# Patient Record
Sex: Female | Born: 1975 | Race: Black or African American | Hispanic: No | Marital: Married | Smoking: Former smoker
Health system: Southern US, Community
[De-identification: ages and names within clinical notes are randomized; demographics above are authoritative.]

## PROBLEM LIST (undated history)

## (undated) DIAGNOSIS — Z9289 Personal history of other medical treatment: Secondary | ICD-10-CM

## (undated) DIAGNOSIS — M2550 Pain in unspecified joint: Secondary | ICD-10-CM

## (undated) DIAGNOSIS — B977 Papillomavirus as the cause of diseases classified elsewhere: Secondary | ICD-10-CM

## (undated) DIAGNOSIS — D649 Anemia, unspecified: Secondary | ICD-10-CM

## (undated) DIAGNOSIS — I10 Essential (primary) hypertension: Secondary | ICD-10-CM

## (undated) DIAGNOSIS — E282 Polycystic ovarian syndrome: Secondary | ICD-10-CM

## (undated) DIAGNOSIS — K219 Gastro-esophageal reflux disease without esophagitis: Secondary | ICD-10-CM

## (undated) DIAGNOSIS — M549 Dorsalgia, unspecified: Secondary | ICD-10-CM

## (undated) DIAGNOSIS — K5900 Constipation, unspecified: Secondary | ICD-10-CM

## (undated) DIAGNOSIS — R002 Palpitations: Secondary | ICD-10-CM

## (undated) DIAGNOSIS — E7800 Pure hypercholesterolemia, unspecified: Secondary | ICD-10-CM

## (undated) DIAGNOSIS — F419 Anxiety disorder, unspecified: Secondary | ICD-10-CM

## (undated) DIAGNOSIS — R7611 Nonspecific reaction to tuberculin skin test without active tuberculosis: Secondary | ICD-10-CM

## (undated) DIAGNOSIS — I509 Heart failure, unspecified: Secondary | ICD-10-CM

## (undated) DIAGNOSIS — I209 Angina pectoris, unspecified: Secondary | ICD-10-CM

## (undated) DIAGNOSIS — R12 Heartburn: Secondary | ICD-10-CM

## (undated) DIAGNOSIS — Z9889 Other specified postprocedural states: Secondary | ICD-10-CM

## (undated) DIAGNOSIS — E785 Hyperlipidemia, unspecified: Secondary | ICD-10-CM

## (undated) DIAGNOSIS — G709 Myoneural disorder, unspecified: Secondary | ICD-10-CM

## (undated) DIAGNOSIS — F32A Depression, unspecified: Secondary | ICD-10-CM

## (undated) DIAGNOSIS — F329 Major depressive disorder, single episode, unspecified: Secondary | ICD-10-CM

## (undated) DIAGNOSIS — K602 Anal fissure, unspecified: Secondary | ICD-10-CM

## (undated) HISTORY — DX: Palpitations: R00.2

## (undated) HISTORY — DX: Personal history of other medical treatment: Z92.89

## (undated) HISTORY — DX: Polycystic ovarian syndrome: E28.2

## (undated) HISTORY — DX: Dorsalgia, unspecified: M54.9

## (undated) HISTORY — DX: Heart failure, unspecified: I50.9

## (undated) HISTORY — DX: Constipation, unspecified: K59.00

## (undated) HISTORY — DX: Pain in unspecified joint: M25.50

## (undated) HISTORY — DX: Anemia, unspecified: D64.9

## (undated) HISTORY — DX: Hyperlipidemia, unspecified: E78.5

## (undated) HISTORY — DX: Heartburn: R12

## (undated) HISTORY — DX: Anal fissure, unspecified: K60.2

## (undated) HISTORY — DX: Essential (primary) hypertension: I10

## (undated) HISTORY — DX: Papillomavirus as the cause of diseases classified elsewhere: B97.7

## (undated) HISTORY — DX: Pure hypercholesterolemia, unspecified: E78.00

## (undated) HISTORY — DX: Myoneural disorder, unspecified: G70.9

---

## 2006-06-11 DIAGNOSIS — R7611 Nonspecific reaction to tuberculin skin test without active tuberculosis: Secondary | ICD-10-CM

## 2006-06-11 HISTORY — DX: Nonspecific reaction to tuberculin skin test without active tuberculosis: R76.11

## 2012-06-30 ENCOUNTER — Observation Stay (HOSPITAL_COMMUNITY)
Admission: EM | Admit: 2012-06-30 | Discharge: 2012-07-03 | Disposition: A | Discharge status: Home / Self Care | Payer: Self-pay | Attending: Family Medicine | Admitting: Family Medicine

## 2012-06-30 ENCOUNTER — Encounter (HOSPITAL_COMMUNITY): Payer: Self-pay | Admitting: *Deleted

## 2012-06-30 ENCOUNTER — Emergency Department (HOSPITAL_COMMUNITY): Payer: Self-pay

## 2012-06-30 DIAGNOSIS — E669 Obesity, unspecified: Secondary | ICD-10-CM

## 2012-06-30 DIAGNOSIS — M549 Dorsalgia, unspecified: Secondary | ICD-10-CM

## 2012-06-30 DIAGNOSIS — I509 Heart failure, unspecified: Secondary | ICD-10-CM | POA: Insufficient documentation

## 2012-06-30 DIAGNOSIS — I428 Other cardiomyopathies: Secondary | ICD-10-CM | POA: Insufficient documentation

## 2012-06-30 DIAGNOSIS — I429 Cardiomyopathy, unspecified: Secondary | ICD-10-CM

## 2012-06-30 DIAGNOSIS — M542 Cervicalgia: Secondary | ICD-10-CM | POA: Insufficient documentation

## 2012-06-30 DIAGNOSIS — I5020 Unspecified systolic (congestive) heart failure: Secondary | ICD-10-CM

## 2012-06-30 DIAGNOSIS — I10 Essential (primary) hypertension: Secondary | ICD-10-CM | POA: Diagnosis present

## 2012-06-30 DIAGNOSIS — R059 Cough, unspecified: Secondary | ICD-10-CM | POA: Insufficient documentation

## 2012-06-30 DIAGNOSIS — Z91199 Patient's noncompliance with other medical treatment and regimen due to unspecified reason: Secondary | ICD-10-CM

## 2012-06-30 DIAGNOSIS — Z9119 Patient's noncompliance with other medical treatment and regimen: Secondary | ICD-10-CM

## 2012-06-30 DIAGNOSIS — R0602 Shortness of breath: Secondary | ICD-10-CM

## 2012-06-30 DIAGNOSIS — R05 Cough: Secondary | ICD-10-CM | POA: Insufficient documentation

## 2012-06-30 DIAGNOSIS — I5021 Acute systolic (congestive) heart failure: Principal | ICD-10-CM | POA: Insufficient documentation

## 2012-06-30 DIAGNOSIS — R002 Palpitations: Secondary | ICD-10-CM | POA: Insufficient documentation

## 2012-06-30 DIAGNOSIS — J811 Chronic pulmonary edema: Secondary | ICD-10-CM | POA: Diagnosis present

## 2012-06-30 DIAGNOSIS — F17200 Nicotine dependence, unspecified, uncomplicated: Secondary | ICD-10-CM | POA: Insufficient documentation

## 2012-06-30 HISTORY — DX: Essential (primary) hypertension: I10

## 2012-06-30 LAB — CBC
HCT: 40.2 % (ref 36.0–46.0)
Hemoglobin: 13.6 g/dL (ref 12.0–15.0)
MCHC: 33.8 g/dL (ref 30.0–36.0)
RBC: 4.6 MIL/uL (ref 3.87–5.11)

## 2012-06-30 LAB — POCT I-STAT TROPONIN I: Troponin i, poc: 0.01 ng/mL (ref 0.00–0.08)

## 2012-06-30 LAB — BASIC METABOLIC PANEL
BUN: 8 mg/dL (ref 6–23)
CO2: 23 mEq/L (ref 19–32)
Chloride: 102 mEq/L (ref 96–112)
GFR calc non Af Amer: >90 mL/min (ref >90)
Glucose, Bld: 108 mg/dL — ABNORMAL HIGH (ref 70–99)
Potassium: 3.5 mEq/L (ref 3.5–5.1)
Sodium: 138 mEq/L (ref 135–145)

## 2012-06-30 MED ORDER — METOPROLOL TARTRATE 25 MG PO TABS
50.0000 mg | ORAL_TABLET | Freq: Once | ORAL | Status: AC
  Administered 2012-06-30: 50 mg via ORAL
  Filled 2012-06-30: qty 2

## 2012-06-30 MED ORDER — ALBUTEROL SULFATE (5 MG/ML) 0.5% IN NEBU
2.5000 mg | INHALATION_SOLUTION | Freq: Once | RESPIRATORY_TRACT | Status: AC
  Administered 2012-06-30: 2.5 mg via RESPIRATORY_TRACT
  Filled 2012-06-30: qty 0.5

## 2012-06-30 MED ORDER — SODIUM CHLORIDE 0.9 % IV BOLUS (SEPSIS)
1000.0000 mL | Freq: Once | INTRAVENOUS | Status: AC
  Administered 2012-06-30: 1000 mL via INTRAVENOUS

## 2012-06-30 MED ORDER — LISINOPRIL 10 MG PO TABS
10.0000 mg | ORAL_TABLET | Freq: Once | ORAL | Status: AC
  Administered 2012-06-30: 10 mg via ORAL
  Filled 2012-06-30: qty 1

## 2012-06-30 MED ORDER — IOHEXOL 350 MG/ML SOLN: 100.0000 mL | Freq: Once | INTRAVENOUS | Status: AC | PRN

## 2012-06-30 MED ORDER — IPRATROPIUM BROMIDE 0.02 % IN SOLN
0.5000 mg | Freq: Once | RESPIRATORY_TRACT | Status: AC
  Administered 2012-06-30: 0.5 mg via RESPIRATORY_TRACT
  Filled 2012-06-30: qty 2.5

## 2012-06-30 NOTE — ED Notes (Signed)
Pt c/o SOB and right neck pain since last Tuesday, flew to [place] on Monday and back last Thursday

## 2012-06-30 NOTE — ED Notes (Signed)
Pt remained at 86% on 2L, Nurse notified PA, obtained order for breathing treatment.

## 2012-06-30 NOTE — ED Notes (Signed)
Pt desatted to 88%. Placed on 2L, pt was having labored respirations, after o2, respiration returned to normal.

## 2012-06-30 NOTE — ED Notes (Signed)
Pt states that the pains she is having now has been going on since Tuesday. Pt states that she does not like pain medicine and can usually manage her pain by using over the counter pain relievers.

## 2012-06-30 NOTE — ED Notes (Signed)
Pt is in XR at current time.

## 2012-06-30 NOTE — ED Provider Notes (Signed)
History     CSN: [id number]  Arrival date & time 06/30/12  2030   First MD Initiated Contact with Patient 06/30/12 2152      Chief Complaint  Patient presents with  . Shortness of Breath    (Consider location/radiation/quality/duration/timing/severity/associated sxs/prior treatment) HPI Comments: Tammy Hogan is a 37 year old female patient with a history of hypertension who presents to the emergency department complaining of persistent shortness of breath x 1 week.  Last Monday she took two plane flights in a row that lasted for a total of 3 hours.  On the second flight she had an episode of shortness of breath and palpitations.  She thought the episode was anxiety.  The next morning she woke up with shortness of breath, spasms in her neck, a headache and palpitations.  She took another plane flight on Thursday.  The palpitations have been intermittent and have been occurring while patient has been in the ED.  She has been having a non-productive cough for three days.  She denies fever, chills, lightheadedness, dizziness, syncope, nausea, vomiting, diarrhea, abdominal pain or chest pain. Denies personal or family history of blood clots. She does not take any exogenous estrogen. No history of asthma. She is a current every day smoker.  Patient is a 37 y.o. female presenting with shortness of breath. The history is provided by the patient.  Shortness of Breath  Associated symptoms include cough and shortness of breath. Pertinent negatives include no chest pain, no fever and no wheezing.    Past Medical History  Diagnosis Date  . Hypertension     History reviewed. No pertinent past surgical history.  History reviewed. No pertinent family history.  History  Substance Use Topics  . Smoking status: Current Every Day Smoker -- 0.5 packs/day  . Smokeless tobacco: Not on file  . Alcohol Use: Yes    OB History    Grav Para Term Preterm Abortions TAB SAB Ect Mult Living        Review of Systems  Constitutional: Negative for fever and chills.  HENT: Positive for neck pain. Negative for neck stiffness.   Respiratory: Positive for cough and shortness of breath. Negative for wheezing.   Cardiovascular: Positive for palpitations. Negative for chest pain.  Gastrointestinal: Negative for nausea and vomiting.  Musculoskeletal: Negative for back pain.  Neurological: Negative for dizziness and light-headedness.  Psychiatric/Behavioral: The patient is not nervous/anxious.   All other systems reviewed and are negative.    Allergies  Percocet  Home Medications  No current outpatient prescriptions on file.  BP 220/130  Pulse 114  Temp 98.1 F (36.7 C)  Resp 22  SpO2 100%  LMP 05/30/2012  Physical Exam  Nursing note and vitals reviewed. Constitutional: She is oriented to person, place, and time. She appears well-developed. She appears distressed.       Obese  HENT:  Head: Normocephalic and atraumatic.  Mouth/Throat: Oropharynx is clear and moist.  Eyes: Conjunctivae normal and EOM are normal. Pupils are equal, round, and reactive to light.  Neck: Normal range of motion. Neck supple. No JVD present. No tracheal deviation present.  Cardiovascular: Regular rhythm, normal heart sounds and intact distal pulses.  Tachycardia present.   Pulmonary/Chest: Accessory muscle usage present. Tachypnea noted.       Expiratory crackles posterior lung bases bilateral.  Abdominal: Soft. Bowel sounds are normal. There is no tenderness.  Musculoskeletal: Normal range of motion. She exhibits no edema.  Neurological: She is alert and oriented to  person, place, and time.  Skin: Skin is warm and dry. She is not diaphoretic.  Psychiatric: Her speech is normal and behavior is normal. Her mood appears anxious.    ED Course  Procedures (including critical care time)  Labs Reviewed  CBC - Abnormal; Notable for the following:    WBC 10.9 (*)     All other components within  normal limits  BASIC METABOLIC PANEL - Abnormal; Notable for the following:    Glucose, Bld 108 (*)     All other components within normal limits  PRO B NATRIURETIC PEPTIDE - Abnormal; Notable for the following:    Pro B Natriuretic peptide (BNP) 462.6 (*)     All other components within normal limits  D-DIMER, QUANTITATIVE  POCT I-STAT TROPONIN I  TROPONIN I   Dg Chest 2 View  06/30/2012  *RADIOLOGY REPORT*  Clinical Data: Shortness of breath and weakness.  CHEST - 2 VIEW  Comparison: None.  Findings: Lungs are clear.  Heart size upper normal.  No pneumothorax or pleural effusion.  IMPRESSION: Negative chest.   Original Report Authenticated By: Holley Dexter, M.D.    Ct Angio Chest Pe W/cm &/or Wo Cm  07/01/2012  *RADIOLOGY REPORT*  Clinical Data:  37 year old with shortness of breath.  CT ANGIOGRAPHY CHEST  Technique:  Multidetector CT imaging of the chest using the standard protocol during bolus administration of intravenous contrast. Multiplanar reconstructed images including MIPs were obtained and reviewed to evaluate the vascular anatomy.  Contrast: OMNIPAQUE IOHEXOL 350 MG/ML SOLN  Comparison: Chest radiograph 06/30/2012  Findings: This is a limited examination due to excessive patient breathing throughout the examination.  There is no evidence for a large or central pulmonary embolism.  The upper abdomen is unremarkable.  No significant pericardial or pleural fluid.  There is no significant mediastinal, hilar or axillary lymphadenopathy.  The trachea and mainstem bronchi are patent.   There is diffuse haziness throughout both lungs which is probably accentuated by the motion artifact.  Findings are also suggestive for atelectasis and cannot exclude some pulmonary edema. No acute bony abnormality.  IMPRESSION: The examination is limited due to excessive motion.  There is no evidence for a large or central pulmonary embolism but difficult to evaluate the lobar and more distal vessels.   Diffuse haziness throughout both lungs is suggestive for atelectasis and cannot exclude pulmonary edema.   Original Report Authenticated By: Richarda Overlie, M.D.     Date: 06/30/2012  Rate: 108  Rhythm: sinus tachycardia  QRS Axis: normal  Intervals: normal  ST/T Wave abnormalities: normal  Conduction Disutrbances:none  Narrative Interpretation: sinus tachycardia, biatrial enlargement, possible anterior infarct, age undetermined  Old EKG Reviewed: none available    1. Pulmonary edema   2. Shortness of breath   3. Hypertension       MDM  37 y/o female with shortness of breath. She has tachycardia, tachypnea, and is hypertensive. D-dimer negative. Metoprolol and lisinopril given for hypertension. O2 sat on 3L oxygen 87%. After receiving breathing treatment her O2 sat increased to 95% on 3L, 91% on RA. Expiratory crackles present in lung bases. CXR clear. Bedside US performed by Dr. Silverio Lay to r/o pericardial effusion which was negative. Obtaining CTA chest to r/o PE despite negative d-dimer.  12:55 AM CT negative for PE, however does show mild pulmonary edema. O2 sat 86% when walking around ED. Patient will be admitted. Case discussed with Dr. Rulon Abide who agrees with plan of care.  1:20 AM Patient will  be admitted by Dr. Dierdre Searles.     Trevor Mace, PA-C 07/01/12 0120

## 2012-07-01 ENCOUNTER — Emergency Department (HOSPITAL_COMMUNITY): Payer: Self-pay

## 2012-07-01 ENCOUNTER — Encounter (HOSPITAL_COMMUNITY): Payer: Self-pay | Admitting: Radiology

## 2012-07-01 DIAGNOSIS — R0602 Shortness of breath: Secondary | ICD-10-CM

## 2012-07-01 DIAGNOSIS — E669 Obesity, unspecified: Secondary | ICD-10-CM

## 2012-07-01 DIAGNOSIS — I10 Essential (primary) hypertension: Secondary | ICD-10-CM | POA: Diagnosis present

## 2012-07-01 DIAGNOSIS — J811 Chronic pulmonary edema: Secondary | ICD-10-CM | POA: Diagnosis present

## 2012-07-01 DIAGNOSIS — Z9119 Patient's noncompliance with other medical treatment and regimen: Secondary | ICD-10-CM

## 2012-07-01 DIAGNOSIS — Z91199 Patient's noncompliance with other medical treatment and regimen due to unspecified reason: Secondary | ICD-10-CM

## 2012-07-01 DIAGNOSIS — M549 Dorsalgia, unspecified: Secondary | ICD-10-CM

## 2012-07-01 DIAGNOSIS — F17200 Nicotine dependence, unspecified, uncomplicated: Secondary | ICD-10-CM

## 2012-07-01 LAB — TSH: TSH: 3.182 u[IU]/mL (ref 0.350–4.500)

## 2012-07-01 LAB — PRO B NATRIURETIC PEPTIDE: Pro B Natriuretic peptide (BNP): 462.6 pg/mL — ABNORMAL HIGH (ref 0–125)

## 2012-07-01 LAB — TROPONIN I: Troponin I: <0.3 ng/mL (ref <0.30)

## 2012-07-01 MED ORDER — SODIUM CHLORIDE 0.9 % IJ SOLN: 3.0000 mL | Freq: Two times a day (BID) | INTRAMUSCULAR | Status: DC

## 2012-07-01 MED ORDER — FUROSEMIDE 10 MG/ML IJ SOLN
60.0000 mg | Freq: Once | INTRAMUSCULAR | Status: AC
  Administered 2012-07-01: 60 mg via INTRAVENOUS

## 2012-07-01 MED ORDER — SODIUM CHLORIDE 0.9 % IV SOLN: 250.0000 mL | INTRAVENOUS | Status: DC | PRN

## 2012-07-01 MED ORDER — MORPHINE SULFATE 2 MG/ML IJ SOLN: 2.0000 mg | INTRAMUSCULAR | Status: DC | PRN

## 2012-07-01 MED ORDER — SODIUM CHLORIDE 0.9 % IJ SOLN: 3.0000 mL | INTRAMUSCULAR | Status: DC | PRN

## 2012-07-01 MED ORDER — LABETALOL HCL 5 MG/ML IV SOLN: 10.0000 mg | INTRAVENOUS | Status: DC | PRN

## 2012-07-01 MED ORDER — FUROSEMIDE 20 MG PO TABS
20.0000 mg | ORAL_TABLET | Freq: Once | ORAL | Status: AC
  Administered 2012-07-01: 20 mg via ORAL
  Filled 2012-07-01: qty 1

## 2012-07-01 MED ORDER — KETOROLAC TROMETHAMINE 30 MG/ML IJ SOLN
30.0000 mg | Freq: Once | INTRAMUSCULAR | Status: AC
Start: 2012-07-01 — End: 2012-07-01
  Administered 2012-07-01: 30 mg via INTRAVENOUS
  Filled 2012-07-01: qty 1

## 2012-07-01 MED ORDER — LABETALOL HCL 5 MG/ML IV SOLN
20.0000 mg | Freq: Once | INTRAVENOUS | Status: AC
  Administered 2012-07-01: 20 mg via INTRAVENOUS
  Filled 2012-07-01: qty 4

## 2012-07-01 MED ORDER — LABETALOL HCL 300 MG PO TABS
300.0000 mg | ORAL_TABLET | Freq: Two times a day (BID) | ORAL | Status: DC
  Filled 2012-07-01 (×2): qty 1

## 2012-07-01 MED ORDER — LABETALOL HCL 200 MG PO TABS
200.0000 mg | ORAL_TABLET | Freq: Two times a day (BID) | ORAL | Status: DC
  Administered 2012-07-01 – 2012-07-02 (×3): 200 mg via ORAL
  Filled 2012-07-01 (×6): qty 1

## 2012-07-01 MED ORDER — IOHEXOL 350 MG/ML SOLN
100.0000 mL | Freq: Once | INTRAVENOUS | Status: AC | PRN
  Administered 2012-07-01: 100 mL via INTRAVENOUS

## 2012-07-01 MED ORDER — METOPROLOL TARTRATE 1 MG/ML IV SOLN
5.0000 mg | Freq: Once | INTRAVENOUS | Status: AC
  Administered 2012-07-01: 5 mg via INTRAVENOUS
  Filled 2012-07-01: qty 5

## 2012-07-01 MED ORDER — ACETAMINOPHEN 500 MG PO TABS
500.0000 mg | ORAL_TABLET | ORAL | Status: DC | PRN
  Administered 2012-07-01 – 2012-07-03 (×5): 500 mg via ORAL
  Filled 2012-07-01 (×5): qty 1

## 2012-07-01 MED ORDER — FUROSEMIDE 10 MG/ML IJ SOLN
INTRAMUSCULAR | Status: AC
  Administered 2012-07-01: 60 mg via INTRAVENOUS
  Filled 2012-07-01: qty 8

## 2012-07-01 MED ORDER — ATENOLOL 50 MG PO TABS
50.0000 mg | ORAL_TABLET | Freq: Every day | ORAL | Status: DC
  Filled 2012-07-01: qty 1

## 2012-07-01 MED ORDER — DOCUSATE SODIUM 100 MG PO CAPS
100.0000 mg | ORAL_CAPSULE | Freq: Two times a day (BID) | ORAL | Status: DC
  Administered 2012-07-01 – 2012-07-02 (×2): 100 mg via ORAL
  Filled 2012-07-01 (×6): qty 1

## 2012-07-01 MED ORDER — LISINOPRIL 10 MG PO TABS
10.0000 mg | ORAL_TABLET | Freq: Every day | ORAL | Status: DC
  Filled 2012-07-01: qty 1

## 2012-07-01 MED ORDER — BIOTENE DRY MOUTH MT LIQD
15.0000 mL | Freq: Two times a day (BID) | OROMUCOSAL | Status: DC
  Administered 2012-07-01 – 2012-07-03 (×3): 15 mL via OROMUCOSAL

## 2012-07-01 MED ORDER — ONDANSETRON HCL 4 MG/2ML IJ SOLN: 4.0000 mg | Freq: Four times a day (QID) | INTRAMUSCULAR | Status: DC | PRN

## 2012-07-01 MED ORDER — LISINOPRIL 20 MG PO TABS
20.0000 mg | ORAL_TABLET | Freq: Every day | ORAL | Status: DC
  Administered 2012-07-01 – 2012-07-03 (×3): 20 mg via ORAL
  Filled 2012-07-01 (×3): qty 1

## 2012-07-01 MED ORDER — ONDANSETRON HCL 4 MG PO TABS: 4.0000 mg | ORAL_TABLET | Freq: Four times a day (QID) | ORAL | Status: DC | PRN

## 2012-07-01 MED ORDER — HYDROCHLOROTHIAZIDE 12.5 MG PO CAPS
12.5000 mg | ORAL_CAPSULE | Freq: Every day | ORAL | Status: DC
  Administered 2012-07-02: 12.5 mg via ORAL
  Filled 2012-07-01: qty 1

## 2012-07-01 NOTE — Progress Notes (Signed)
Pt c/o "feeling like it is hard to breathe." Pt lungs diminished. No crackles heard. Pt sats 92-94% on 3L O2 via nasal cannula. States that it is easier to catch her breath when sitting up in bed. Dr. Gwenlyn Perking paged to make aware. Will continue to monitor. Levonne Spiller, RN

## 2012-07-01 NOTE — H&P (Signed)
[facility] History and Physical  Tammy Hogan NWG:[id number] DOB: [birth year]    PCP:   Default, Provider, MD   Chief Complaint:  Shortness of breath.  HPI: Tammy Hogan is an 37 y.o. female with hx of long standing HTN dated back to when she was 69 y o, hx of noncompliant to medication where she has been off her meds for at least a year, presents to the ER with SOB.  She denied any chest pain.  Evaluation in the ER included an unremarkable labs, with normal renal fx, normal WBC, and a normal CXR.  Because of concern for a PE, a CTPA was done, which excluded a PE, but suggest early pulmonary edema.  In the ER, she was found to be severely HTN, and IV lopressor along with PO Lisinopril was given.  Hospitalist was asked to admit her for HTN pulmonary edema.  Rewiew of Systems:  Constitutional: Negative for malaise, fever and chills. No significant weight loss or weight gain Eyes: Negative for eye pain, redness and discharge, diplopia, visual changes, or flashes of light. ENMT: Negative for ear pain, hoarseness, nasal congestion, sinus pressure and sore throat. No headaches; tinnitus, drooling, or problem swallowing. Cardiovascular: Negative for chest pain, palpitations, diaphoresis, and peripheral edema. ; No orthopnea, PND Respiratory: Negative for cough, hemoptysis, wheezing and stridor. No pleuritic chestpain. Gastrointestinal: Negative for nausea, vomiting, diarrhea, constipation, abdominal pain, melena, blood in stool, hematemesis, jaundice and rectal bleeding.    Genitourinary: Negative for frequency, dysuria, incontinence,flank pain and hematuria; Musculoskeletal: Negative for back pain and neck pain. Negative for swelling and trauma.;  Skin: . Negative for pruritus, rash, abrasions, bruising and skin lesion.; ulcerations Neuro: Negative for  lightheadedness and neck stiffness. Negative for weakness, altered level of consciousness , altered mental status, extremity weakness,  burning feet, involuntary movement, seizure and syncope.  Psych: negative for anxiety, depression, insomnia, tearfulness, panic attacks, hallucinations, paranoia, suicidal or homicidal ideation    Past Medical History  Diagnosis Date  . Hypertension     History reviewed. No pertinent past surgical history.  Medications:  HOME MEDS: Prior to Admission medications   Medication Sig Start Date End Date Taking? Authorizing Provider  Acetaminophen (TYLENOL PO) Take 2 tablets by mouth every 6 (six) hours as needed. For pain   Yes Historical Provider, MD     Allergies:  Allergies  Allergen Reactions  . Percocet (Oxycodone-Acetaminophen) Nausea Only    Social History:   reports that she has been smoking.  She does not have any smokeless tobacco history on file. She reports that she drinks alcohol. She reports that she does not use illicit drugs.  Family History: History reviewed. No pertinent family history.   Physical Exam: Filed Vitals:   07/01/12 0126 07/01/12 0201 07/01/12 0205 07/01/12 0442  BP: 167/119 167/101 150/95 167/133  Pulse:  88 88 92  Temp:    97.8 F (36.6 C)  TempSrc:    Oral  Resp:  19 19 18   Height:    5' 3.75" (1.619 m)  Weight:    117.935 kg (260 lb)  SpO2:  94% 95% 91%   Blood pressure 167/133, pulse 92, temperature 97.8 F (36.6 C), temperature source Oral, resp. rate 18, height 5' 3.75" (1.619 m), weight 117.935 kg (260 lb), last menstrual period 05/30/2012, SpO2 91.00%.  GEN:  Pleasant patient lying in the stretcher in no acute distress; cooperative with exam. PSYCH:  alert and oriented x4; does not appear anxious or depressed; affect is appropriate. HEENT:  Mucous membranes pink and anicteric; PERRLA; EOM intact; no cervical lymphadenopathy nor thyromegaly or carotid bruit; no JVD; There were no stridor. Neck is very supple. Breasts:: Not examined CHEST WALL: No tenderness CHEST: Normal respiration, clear to auscultation bilaterally.  HEART:  Regular rate and rhythm.  There are no murmur, rub, or gallops.   BACK: No kyphosis or scoliosis; no CVA tenderness ABDOMEN: soft and non-tender; no masses, no organomegaly, normal abdominal bowel sounds; no pannus; no intertriginous candida. There is no rebound and no distention. Rectal Exam: Not done EXTREMITIES: No bone or joint deformity; age-appropriate arthropathy of the hands and knees; no edema; no ulcerations.  There is no calf tenderness. Genitalia: not examined PULSES: 2+ and symmetric SKIN: Normal hydration no rash or ulceration CNS: Cranial nerves 2-12 grossly intact no focal lateralizing neurologic deficit.  Speech is fluent; uvula elevated with phonation, facial symmetry and tongue midline. DTR are normal bilaterally, cerebella exam is intact, barbinski is negative and strengths are equaled bilaterally.  No sensory loss.   Labs on Admission:  Basic Metabolic Panel:  Lab 06/30/12 4782  NA 138  K 3.5  CL 102  CO2 23  GLUCOSE 108*  BUN 8  CREATININE 0.68  CALCIUM 9.3  MG --  PHOS --   Liver Function Tests: No results found for this basename: AST:5,ALT:5,ALKPHOS:5,BILITOT:5,PROT:5,ALBUMIN:5 in the last 168 hours No results found for this basename: LIPASE:5,AMYLASE:5 in the last 168 hours No results found for this basename: AMMONIA:5 in the last 168 hours CBC:  Lab 06/30/12 2110  WBC 10.9*  NEUTROABS --  HGB 13.6  HCT 40.2  MCV 87.4  PLT 376   Cardiac Enzymes:  Lab 06/30/12 2338  CKTOTAL --  CKMB --  CKMBINDEX --  TROPONINI <0.30    CBG: No results found for this basename: GLUCAP:5 in the last 168 hours   Radiological Exams on Admission: Dg Chest 2 View  06/30/2012  *RADIOLOGY REPORT*  Clinical Data: Shortness of breath and weakness.  CHEST - 2 VIEW  Comparison: None.  Findings: Lungs are clear.  Heart size upper normal.  No pneumothorax or pleural effusion.  IMPRESSION: Negative chest.   Original Report Authenticated By: Holley Dexter, M.D.    Ct  Angio Chest Pe W/cm &/or Wo Cm  07/01/2012  *RADIOLOGY REPORT*  Clinical Data:  37 year old with shortness of breath.  CT ANGIOGRAPHY CHEST  Technique:  Multidetector CT imaging of the chest using the standard protocol during bolus administration of intravenous contrast. Multiplanar reconstructed images including MIPs were obtained and reviewed to evaluate the vascular anatomy.  Contrast: OMNIPAQUE IOHEXOL 350 MG/ML SOLN  Comparison: Chest radiograph 06/30/2012  Findings: This is a limited examination due to excessive patient breathing throughout the examination.  There is no evidence for a large or central pulmonary embolism.  The upper abdomen is unremarkable.  No significant pericardial or pleural fluid.  There is no significant mediastinal, hilar or axillary lymphadenopathy.  The trachea and mainstem bronchi are patent.   There is diffuse haziness throughout both lungs which is probably accentuated by the motion artifact.  Findings are also suggestive for atelectasis and cannot exclude some pulmonary edema. No acute bony abnormality.  IMPRESSION: The examination is limited due to excessive motion.  There is no evidence for a large or central pulmonary embolism but difficult to evaluate the lobar and more distal vessels.  Diffuse haziness throughout both lungs is suggestive for atelectasis and cannot exclude pulmonary edema.   Original Report Authenticated By: Richarda Overlie,  M.D.     EKG: Independently reviewed. No acute ST-T changes.   Assessment/Plan Present on Admission:  . HTN (hypertension), malignant . Pulmonary edema . Tobacco use disorder   PLAN:  WIll admit her for HTN pulmonary edema.  She needs to be better compliant with her meds.  I will use Atenelol and Lisinopril to tx her severe HTN, and use PRN Labetalol in addition.  She was strongly advise to quit cigarettes as well.   I don't think she has any ischemic cardiomyopathy.  She will likely need outpatient work up for secondary  causes of HTN along with perhaps a polysomnogram.    She is stable, full code, and will be admitted to [facility] service. Other plans as per orders.  Code Status: FULL Unk Lightning, MD. [facility] Pager [phone] 7pm to 7am.  07/01/2012, 6:19 AM

## 2012-07-01 NOTE — Progress Notes (Signed)
Patient seen and examined. Breathing and feeling better; still with mild orthopnea. She was admitted after midnight secondary to accelerated HTN with mild pulmonary vasc congestion. BP improved at this time after IV therapy (labetalol).  Plan: -Will check 2-d echo -Give lasix IV and follow close I's and O's and daily weight -continue labetalol, HCTZ and lisinopril -counseling for smoking and also weight loss provided. -titrate oxygen to RA as tolerated.  Tammy Hogan [phone]

## 2012-07-01 NOTE — ED Notes (Signed)
Pt complaining of back pain, Admitting physician paged, per Admitting, give 30 mg of toradol IV once.

## 2012-07-01 NOTE — Progress Notes (Signed)
Utilization review completed.  

## 2012-07-01 NOTE — Care Management Note (Signed)
    Page 1 of 1   07/02/2012     11:21:54 AM   CARE MANAGEMENT NOTE 07/02/2012  Patient:  JURNEY, OVERACKER   Account Number:  [id number]  Date Initiated:  07/01/2012  Documentation initiated by:  GRAVES-BIGELOW,Boluwatife Mutchler  Subjective/Objective Assessment:   Pt admitted with increased bp and has a new job. pt wil not get paid until the end of the year.     Action/Plan:   CM did discuss the Match Program with the pt. She will have to pay 3.00 for each Rx. Labetalol for 30 day supply at [facility] is 41.72 and she will not be able to afford.   Anticipated DC Date:  07/02/2012   Anticipated DC Plan:  HOME/SELF CARE      DC Planning Services  CM consult  MATCH Program  Medication Assistance      Choice offered to / List presented to:             Status of service:  Completed, signed off Medicare Important Message given?   (If response is "NO", the following Medicare IM given date fields will be blank) Date Medicare IM given:   Date Additional Medicare IM given:    Discharge Disposition:  HOME/SELF CARE  Per UR Regulation:  Reviewed for med. necessity/level of care/duration of stay  If discussed at Long Length of Stay Meetings, dates discussed:    Comments:  07-02-12 Tomi Bamberger, RN,BSN [phone] Match letter provided to pt for each Rx to be 3.00. CM did speak to pt and she has a PCP. Pt unable to give me a name at this time. She states she has the information at home. Pt will set f/u appointment up. No further needs from CM at this time.

## 2012-07-01 NOTE — Progress Notes (Signed)
  Echocardiogram 2D Echocardiogram has been performed.  Tammy Hogan FRANCES 07/01/2012, 5:43 PM

## 2012-07-02 DIAGNOSIS — I428 Other cardiomyopathies: Secondary | ICD-10-CM

## 2012-07-02 DIAGNOSIS — I5020 Unspecified systolic (congestive) heart failure: Secondary | ICD-10-CM

## 2012-07-02 DIAGNOSIS — I429 Cardiomyopathy, unspecified: Secondary | ICD-10-CM

## 2012-07-02 LAB — BASIC METABOLIC PANEL
BUN: 10 mg/dL (ref 6–23)
Calcium: 8.7 mg/dL (ref 8.4–10.5)
Creatinine, Ser: 0.68 mg/dL (ref 0.50–1.10)
GFR calc Af Amer: >90 mL/min (ref >90)
GFR calc non Af Amer: >90 mL/min (ref >90)
Glucose, Bld: 116 mg/dL — ABNORMAL HIGH (ref 70–99)
Potassium: 3.4 mEq/L — ABNORMAL LOW (ref 3.5–5.1)

## 2012-07-02 LAB — CBC
HCT: 37 % (ref 36.0–46.0)
MCH: 28.5 pg (ref 26.0–34.0)
MCHC: 33 g/dL (ref 30.0–36.0)
RDW: 14.5 % (ref 11.5–15.5)

## 2012-07-02 LAB — RAPID URINE DRUG SCREEN, HOSP PERFORMED
Amphetamines: NOT DETECTED
Tetrahydrocannabinol: POSITIVE — AB

## 2012-07-02 MED ORDER — FUROSEMIDE 20 MG PO TABS
20.0000 mg | ORAL_TABLET | Freq: Every day | ORAL | Status: DC
  Administered 2012-07-03: 20 mg via ORAL
  Filled 2012-07-02: qty 1

## 2012-07-02 MED ORDER — POTASSIUM CHLORIDE CRYS ER 20 MEQ PO TBCR
EXTENDED_RELEASE_TABLET | ORAL | Status: AC
  Administered 2012-07-02: 40 meq via ORAL
  Filled 2012-07-02: qty 2

## 2012-07-02 MED ORDER — POTASSIUM CHLORIDE CRYS ER 20 MEQ PO TBCR
40.0000 meq | EXTENDED_RELEASE_TABLET | Freq: Two times a day (BID) | ORAL | Status: AC
  Administered 2012-07-02 (×2): 40 meq via ORAL
  Filled 2012-07-02: qty 2

## 2012-07-02 MED ORDER — METOPROLOL TARTRATE 50 MG PO TABS
50.0000 mg | ORAL_TABLET | Freq: Two times a day (BID) | ORAL | Status: DC
  Administered 2012-07-02 – 2012-07-03 (×2): 50 mg via ORAL
  Filled 2012-07-02 (×3): qty 1

## 2012-07-02 NOTE — Consult Note (Addendum)
Admit date: 06/30/2012 Referring Physician  Dr. Irene Limbo  Primary Cardiologist  Maryori Weide-new Reason for Consultation  cardiomyopathy  HPI: 37 y/o who has had difficult to control HTN for many years.  She was diagnosed with hypertension as a child and treated with salt restriction.  She has been prescribed medications for many years but due to finances, has not always taken her medications. SHe has been unable to get her medicines.  SHe was most recently seen in [place] a few years ago.  She would get free medications there but unfortunately, the free clinic stopped operations.  She has some HCTZ and Diovan at home.  She would only use these medications if she had a headache that she equated to her hypertension.  She says she had a 30 day supply that she was trying to make last for a year.    She came into the hospital with shortness of breath.  She was diureses.  Echocardiogram revealed decreased LV function and there was some evidence of regional wall motion abnormality on certain views.  A concern for coronary artery disease was raised by the echo.  The patient denies any chest pain.  Most recently, walking up stairs has been her most strenuous activity.  She gets some shortness of breath but no pain in her chest.  She is ruled out for MI with blood test.  We discussed further workup for coronary artery disease.  One of her main concerns is that she will will have insurance starting on February 1.  She would like to avoid any large medical expenses until that time.     PMH:   Past Medical History  Diagnosis Date  . Hypertension      PSH:  History reviewed. No pertinent past surgical history.  Allergies:  Percocet Prior to Admit Meds:   Prescriptions prior to admission  Medication Sig Dispense Refill  . Acetaminophen (TYLENOL PO) Take 2 tablets by mouth every 6 (six) hours as needed. For pain       Fam HX:   History reviewed. No pertinent family history. Social HX:    History   Social  History  . Marital Status: Single    Spouse Name: N/A    Number of Children: N/A  . Years of Education: N/A   Occupational History  . Not on file.   Social History Main Topics  . Smoking status: Current Every Day Smoker -- 0.5 packs/day  . Smokeless tobacco: Not on file  . Alcohol Use: Yes  . Drug Use: No  . Sexually Active:    Other Topics Concern  . Not on file   Social History Narrative  . No narrative on file     ROS:  All 11 ROS were addressed and are negative except what is stated in the HPI  Physical Exam: Blood pressure 127/88, pulse 88, temperature 97.6 F (36.4 C), temperature source Oral, resp. rate 18, height 5' 3.75" (1.619 m), weight 116.393 kg (256 lb 9.6 oz), last menstrual period 05/30/2012, SpO2 98.00%.    General: Well developed, well nourished, in no acute distress Head:   Normal cephalic and atramatic  Lungs:   Clear bilaterally to auscultation and percussion. Heart:   HRRR S1 S2 , 2+ right radial, 2+ right dorsalis pedis Abdomen: , abdomen soft and non-tender, obese. Msk:  Back normal, normal gait. Normal strength and tone for age. Extremities:   No  edema.   Neuro: Alert and oriented X 3. Psych:  Normal affect, responds  appropriately    Labs:   Lab Results  Component Value Date   WBC 7.3 07/02/2012   HGB 12.2 07/02/2012   HCT 37.0 07/02/2012   MCV 86.4 07/02/2012   PLT 279 07/02/2012    Lab 07/02/12 0640  NA 136  K 3.4*  CL 102  CO2 23  BUN 10  CREATININE 0.68  CALCIUM 8.7  PROT --  BILITOT --  ALKPHOS --  ALT --  AST --  GLUCOSE 116*   No results found for this basename: PTT   No results found for this basename: INR, PROTIME   Lab Results  Component Value Date   TROPONINI <0.30 07/02/2012     No results found for this basename: CHOL   No results found for this basename: HDL   No results found for this basename: LDLCALC   No results found for this basename: TRIG   No results found for this basename: CHOLHDL   No  results found for this basename: LDLDIRECT      Radiology:  Dg Chest 2 View  06/30/2012  *RADIOLOGY REPORT*  Clinical Data: Shortness of breath and weakness.  CHEST - 2 VIEW  Comparison: None.  Findings: Lungs are clear.  Heart size upper normal.  No pneumothorax or pleural effusion.  IMPRESSION: Negative chest.   Original Report Authenticated By: Holley Dexter, M.D.    Ct Angio Chest Pe W/cm &/or Wo Cm  07/01/2012  *RADIOLOGY REPORT*  Clinical Data:  37 year old with shortness of breath.  CT ANGIOGRAPHY CHEST  Technique:  Multidetector CT imaging of the chest using the standard protocol during bolus administration of intravenous contrast. Multiplanar reconstructed images including MIPs were obtained and reviewed to evaluate the vascular anatomy.  Contrast: OMNIPAQUE IOHEXOL 350 MG/ML SOLN  Comparison: Chest radiograph 06/30/2012  Findings: This is a limited examination due to excessive patient breathing throughout the examination.  There is no evidence for a large or central pulmonary embolism.  The upper abdomen is unremarkable.  No significant pericardial or pleural fluid.  There is no significant mediastinal, hilar or axillary lymphadenopathy.  The trachea and mainstem bronchi are patent.   There is diffuse haziness throughout both lungs which is probably accentuated by the motion artifact.  Findings are also suggestive for atelectasis and cannot exclude some pulmonary edema. No acute bony abnormality.  IMPRESSION: The examination is limited due to excessive motion.  There is no evidence for a large or central pulmonary embolism but difficult to evaluate the lobar and more distal vessels.  Diffuse haziness throughout both lungs is suggestive for atelectasis and cannot exclude pulmonary edema.   Original Report Authenticated By: Richarda Overlie, M.D.     EKG:  NSR, no ST segment changes  Echo: I personally reviewed her echo.  In some views, the septal wall motion or melena he appears more  significant than the remainder of her myocardium.  ASSESSMENT: Newly diagnosed cardiomyopathy, hypertension, tobacco abuse, obesity  PLAN:  She does need an ischemia workup to see if this is a cause of her cardiomyopathy.  We discussed cardiac catheterization.  Given that her medical insurance will start on February 1, she would like to avoid having any procedures done until after that time.  She is ruled out for MI.  She does not have symptoms of angina.  I think it is reasonable to perform diagnostic cath after February 1.  We can try and do this as an outpatient in February.  I stressed the importance of her taking  medications to control her blood pressure.  Ideally, a regimen could be started that would be affordable.  She is currently on metoprolol, lisinopril, furosemide. Blood pressures been better controlled.  I stressed the importance of her stopping smoking.  She would also benefit from weight loss.   Corky Crafts., MD  07/02/2012  4:15 PM

## 2012-07-02 NOTE — Progress Notes (Signed)
[facility] PROGRESS NOTE  Tammy Hogan OZH:[id number] DOB: [birth year] DOA: 06/30/2012 PCP: Default, Provider, MD she has a PCP. Pt unable to give me a name at this time. She states she has the information at home. Pt will set f/u appointment up  Assessment/Plan: 1. Cardiomyopathy, new diagnosis systolic heart failure: Asymptomatic now. Wall motion abnormalities concerning for ischemic etiology. Cardiology consultation requested. Daily weights. Lasix, beta blocker, ACE inhibitor. 2. Acute systolic heart failure: Plan as above. Appears asymptomatic, compensated at this point. 3. Accelerated hypertension, suspected hypertensive urgency or emergency on admission: Overall improved but still labile. Consider outpatient evaluation for secondary hypertension and polysomnogram. EKG sinus rhythm, no acute changes. 4. Uncontrolled Hypertension: Diagnosed approximately age 15. Noncompliant with medications. 5. Smoker: Recommend cessation   Code Status: Full code Family Communication: None present Disposition Plan: Home when workup complete  Brendia Sacks, MD  [facility] Team 5 Pager [phone] If 7PM-7AM, please contact night-coverage at [email or web address], password [facility] 07/02/2012, 1:43 PM  LOS: 2 days   Brief narrative: 37 y.o. female with hx of long standing HTN dated back to when she was 52 y o, hx of noncompliant to medication where she has been off her meds for at least a year, presents to the ER with SOB. She denied any chest pain. Evaluation in the ER included an unremarkable labs, with normal renal fx, normal WBC, and a normal CXR. Because of concern for a PE, a CTPA was done, which excluded a PE, but suggest early pulmonary edema. In the ER, she was found to be severely HTN, and IV lopressor along with PO Lisinopril was given. Hospitalist was asked to admit her for HTN pulmonary edema.  Consultants:  [facility] cardiology  Procedures:  2-D echocardiogram: Left ventricle: The  cavity size was normal. There was moderate concentric hypertrophy. Systolic function was moderately reduced. The estimated ejection fraction was in the range of 35% to 40%. There is severe hypokinesis to akinesis of theanterior and anteroseptal walls to the apex, consistent with LAD territory ischemia / infarct. Doppler parameters are consistent with pseudonormal left ventricular relaxation (grade2 diastolic dysfunction). The E/A ratio is >1.5. The E/e' ratio is >15, suggesting markedly elevated LV filling pressure.   HPI/Subjective: Labile blood pressure. Denies chest pain, shortness of breath. No lower extremity edema. Feels well.  Objective: Filed Vitals:   07/01/12 1128 07/01/12 1413 07/01/12 2034 07/02/12 0531  BP: 158/98 131/85 157/103 139/82  Pulse: 96 89 99 88  Temp:  97.5 F (36.4 C) 98.1 F (36.7 C) 97.8 F (36.6 C)  TempSrc:  Oral Oral Oral  Resp:  20 20 18   Height:      Weight:    116.393 kg (256 lb 9.6 oz)  SpO2:  99% 97% 97%    Intake/Output Summary (Last 24 hours) at 07/02/12 1343 Last data filed at 07/02/12 0500  Gross per 24 hour  Intake     60 ml  Output   2100 ml  Net  -2040 ml   Filed Weights   07/01/12 0442 07/02/12 0531  Weight: 117.935 kg (260 lb) 116.393 kg (256 lb 9.6 oz)    Exam:  General:  Appears calm and comfortable Cardiovascular: RRR, no m/r/g. No LE edema. Telemetry: SR, no arrhythmias  Respiratory: CTA bilaterally, no w/r/r. Normal respiratory effort. Psychiatric: grossly normal mood and affect, speech fluent and appropriate  Data Reviewed: Basic Metabolic Panel:  Lab 07/02/12 1324 06/30/12 2110  NA 136 138  K 3.4* 3.5  CL 102 102  CO2 23 23  GLUCOSE 116* 108*  BUN 10 8  CREATININE 0.68 0.68  CALCIUM 8.7 9.3  MG -- --  PHOS -- --   CBC:  Lab 07/02/12 0640 06/30/12 2110  WBC 7.3 10.9*  NEUTROABS -- --  HGB 12.2 13.6  HCT 37.0 40.2  MCV 86.4 87.4  PLT 279 376   Cardiac Enzymes:  Lab 06/30/12 2338  CKTOTAL --    CKMB --  CKMBINDEX --  TROPONINI <0.30     Basename 06/30/12 2338  PROBNP 462.6*   Studies: Dg Chest 2 View  06/30/2012  *RADIOLOGY REPORT*  Clinical Data: Shortness of breath and weakness.  CHEST - 2 VIEW  Comparison: None.  Findings: Lungs are clear.  Heart size upper normal.  No pneumothorax or pleural effusion.  IMPRESSION: Negative chest.   Original Report Authenticated By: Holley Dexter, M.D.    Ct Angio Chest Pe W/cm &/or Wo Cm  07/01/2012  *RADIOLOGY REPORT*  Clinical Data:  37 year old with shortness of breath.  CT ANGIOGRAPHY CHEST  Technique:  Multidetector CT imaging of the chest using the standard protocol during bolus administration of intravenous contrast. Multiplanar reconstructed images including MIPs were obtained and reviewed to evaluate the vascular anatomy.  Contrast: OMNIPAQUE IOHEXOL 350 MG/ML SOLN  Comparison: Chest radiograph 06/30/2012  Findings: This is a limited examination due to excessive patient breathing throughout the examination.  There is no evidence for a large or central pulmonary embolism.  The upper abdomen is unremarkable.  No significant pericardial or pleural fluid.  There is no significant mediastinal, hilar or axillary lymphadenopathy.  The trachea and mainstem bronchi are patent.   There is diffuse haziness throughout both lungs which is probably accentuated by the motion artifact.  Findings are also suggestive for atelectasis and cannot exclude some pulmonary edema. No acute bony abnormality.  IMPRESSION: The examination is limited due to excessive motion.  There is no evidence for a large or central pulmonary embolism but difficult to evaluate the lobar and more distal vessels.  Diffuse haziness throughout both lungs is suggestive for atelectasis and cannot exclude pulmonary edema.   Original Report Authenticated By: Richarda Overlie, M.D.     Scheduled Meds:   . antiseptic oral rinse  15 mL Mouth Rinse BID  . docusate sodium  100 mg Oral BID  .  hydrochlorothiazide  12.5 mg Oral Daily  . labetalol  200 mg Oral BID  . lisinopril  20 mg Oral Daily  . sodium chloride  3 mL Intravenous Q12H  . sodium chloride  3 mL Intravenous Q12H   Continuous Infusions:   Principal Problem:  *HTN (hypertension), malignant Active Problems:  Pulmonary edema  Back pain, acute  Medically noncompliant  Tobacco use disorder     Brendia Sacks, MD  [facility] Team 5 Pager [phone] If 7PM-7AM, please contact night-coverage at [email or web address], password [facility] 07/02/2012, 1:43 PM  LOS: 2 days   Time spent: 25 minutes

## 2012-07-03 DIAGNOSIS — I10 Essential (primary) hypertension: Secondary | ICD-10-CM

## 2012-07-03 LAB — LIPID PANEL
Cholesterol: 189 mg/dL (ref 0–200)
Triglycerides: 174 mg/dL — ABNORMAL HIGH (ref <150)

## 2012-07-03 MED ORDER — LISINOPRIL 10 MG PO TABS: 10.0000 mg | ORAL_TABLET | Freq: Every day | ORAL | Status: DC

## 2012-07-03 MED ORDER — NITROGLYCERIN 0.4 MG SL SUBL: 0.4000 mg | SUBLINGUAL_TABLET | SUBLINGUAL | Status: DC | PRN

## 2012-07-03 MED ORDER — ASPIRIN EC 81 MG PO TBEC
81.0000 mg | DELAYED_RELEASE_TABLET | Freq: Every day | ORAL | Status: DC
  Administered 2012-07-03: 81 mg via ORAL
  Filled 2012-07-03: qty 1

## 2012-07-03 MED ORDER — ASPIRIN 81 MG PO TBEC: 81.0000 mg | DELAYED_RELEASE_TABLET | Freq: Every day | ORAL | Status: DC

## 2012-07-03 MED ORDER — METOPROLOL TARTRATE 50 MG PO TABS: 50.0000 mg | ORAL_TABLET | Freq: Two times a day (BID) | ORAL | Status: DC

## 2012-07-03 MED ORDER — FUROSEMIDE 20 MG PO TABS: 20.0000 mg | ORAL_TABLET | Freq: Every day | ORAL | Status: DC

## 2012-07-03 NOTE — Progress Notes (Signed)
Pt provided with dc instructions and education. Verbalized understanding. Pt provided with prescriptions and educated on new medications. Pt has card provided by case manager to help with medications until insurance coverage starts in Feb. Pt aware of all follow up appointments. Heart monitor cleaned and returned to front. Pt leaving by foot for home. Levonne Spiller, RN

## 2012-07-03 NOTE — ED Provider Notes (Signed)
Medical screening examination/treatment/procedure(s) were conducted as a shared visit with non-physician practitioner(s) and myself.  I personally evaluated the patient during the encounter  Tammy Hogan is a 37 y.o. female here with SOB. + recent travel as well. Hypoxic on exam and slightly tachypneic. + crackles bilateral bases but no wheezing. BNP slightly elevated at 462. D-dimer negative but suspicion for PE was moderate to high. CTPA showed no PE but some pulmonary edema. Patient admitted to medicine for further workup and for hypoxia.    Richardean Canal, MD 07/03/12 1308

## 2012-07-03 NOTE — Progress Notes (Signed)
Pt c/o SOB while moving her covers in bed. Pt o2 sats 95% on RA. Pt felt better when I arrived to room. Will continue to monitor.

## 2012-07-03 NOTE — Progress Notes (Signed)
[facility] PROGRESS NOTE  Jess Sulak YNW:[id number] DOB: 11-09-1975 DOA: 06/30/2012 PCP: Default, Provider, MD she has a PCP. Pt unable to give me a name at this time. She states she has the information at home. Pt will set f/u appointment up  Assessment/Plan: 1. Cardiomyopathy, new diagnosis systolic heart failure: Asymptomatic. Discussed with Dr. Cydney Ok hypertensive etiology rather than ischemic, however wall motion abnormalities to raise concern for ischemic etiology. Per discussion with him okay for discharge, outpatient followup which he will arrange for outpatient catheterization. Lasix, beta blocker, ACE inhibitor. Add aspirin. 2. Acute systolic heart failure: Plan as above. Appears asymptomatic, compensated at this point. 3. Accelerated hypertension, suspected hypertensive urgency or emergency on admission: Much improved.  4. Uncontrolled Hypertension: Diagnosed approximately age 49. Noncompliant with medications. Much improved as above. 5. Smoker: Recommend cessation.   Code Status: Full code Family Communication: None present Disposition Plan: Home   Brendia Sacks, MD  [facility] Team 5 Pager [phone] If 7PM-7AM, please contact night-coverage at [email or web address], password [facility] 07/03/2012, 12:59 PM  LOS: 3 days   Brief narrative: 37 y.o. female with hx of long standing HTN dated back to when she was 13 y o, hx of noncompliant to medication where she has been off her meds for at least a year, presents to the ER with SOB. She denied any chest pain. Evaluation in the ER included an unremarkable labs, with normal renal fx, normal WBC, and a normal CXR. Because of concern for a PE, a CTPA was done, which excluded a PE, but suggest early pulmonary edema. In the ER, she was found to be severely HTN, and IV lopressor along with PO Lisinopril was given. Hospitalist was asked to admit her for HTN pulmonary edema.  Consultants:  [facility]  cardiology  Procedures:  2-D echocardiogram: Left ventricle: The cavity size was normal. There was moderate concentric hypertrophy. Systolic function was moderately reduced. The estimated ejection fraction was in the range of 35% to 40%. There is severe hypokinesis to akinesis of theanterior and anteroseptal walls to the apex, consistent with LAD territory ischemia / infarct. Doppler parameters are consistent with pseudonormal left ventricular relaxation (grade2 diastolic dysfunction). The E/A ratio is >1.5. The E/e' ratio is >15, suggesting markedly elevated LV filling pressure.   HPI/Subjective: Blood pressure improved. Feels much better. Has some muscular tenderness in back and neck.  Objective: Filed Vitals:   07/02/12 0531 07/02/12 1400 07/02/12 2100 07/02/12 2301  BP: 139/82 127/88 156/101 139/95  Pulse: 88 88 96 86  Temp: 97.8 F (36.6 C) 97.6 F (36.4 C) 98 F (36.7 C)   TempSrc: Oral  Oral   Resp: 18 18 20    Height:      Weight: 116.393 kg (256 lb 9.6 oz)     SpO2: 97% 98% 100%     Intake/Output Summary (Last 24 hours) at 07/03/12 1259 Last data filed at 07/02/12 1816  Gross per 24 hour  Intake      0 ml  Output    900 ml  Net   -900 ml   Filed Weights   07/01/12 0442 07/02/12 0531  Weight: 117.935 kg (260 lb) 116.393 kg (256 lb 9.6 oz)    Exam:  General:  Appears calm and comfortable Cardiovascular: RRR, no m/r/g. No LE edema. Telemetry: SR, no arrhythmias  Respiratory: CTA bilaterally, no w/r/r. Normal respiratory effort. Psychiatric: grossly normal mood and affect, speech fluent and appropriate  Exam current 1/23  Data Reviewed: Basic Metabolic Panel:  Lab 07/02/12  1610 06/30/12 2110  NA 136 138  K 3.4* 3.5  CL 102 102  CO2 23 23  GLUCOSE 116* 108*  BUN 10 8  CREATININE 0.68 0.68  CALCIUM 8.7 9.3  MG -- --  PHOS -- --   CBC:  Lab 07/02/12 0640 06/30/12 2110  WBC 7.3 10.9*  NEUTROABS -- --  HGB 12.2 13.6  HCT 37.0 40.2  MCV 86.4 87.4   PLT 279 376   Cardiac Enzymes:  Lab 07/02/12 1415 06/30/12 2338  CKTOTAL -- --  CKMB -- --  CKMBINDEX -- --  TROPONINI <0.30 <0.30     Basename 06/30/12 2338  PROBNP 462.6*   Studies: No results found.  Scheduled Meds:    . antiseptic oral rinse  15 mL Mouth Rinse BID  . aspirin EC  81 mg Oral Daily  . docusate sodium  100 mg Oral BID  . furosemide  20 mg Oral Daily  . lisinopril  20 mg Oral Daily  . metoprolol tartrate  50 mg Oral BID  . sodium chloride  3 mL Intravenous Q12H  . sodium chloride  3 mL Intravenous Q12H   Continuous Infusions:   Principal Problem:  *HTN (hypertension), malignant Active Problems:  Pulmonary edema  Back pain, acute  Medically noncompliant  Tobacco use disorder  Cardiomyopathy  Systolic heart failure     Brendia Sacks, MD  [facility] Team 5 Pager [phone] If 7PM-7AM, please contact night-coverage at [email or web address], password [facility] 07/03/2012, 12:59 PM  LOS: 3 days

## 2012-07-03 NOTE — Discharge Summary (Signed)
Physician Discharge Summary  Tammy Hogan WUJ:[id number] DOB: December 16, 1975 DOA: 06/30/2012  PCP: Default, Provider, MDshe has a PCP. Pt unable to give me a name at this time. She states she has the information at home. Pt will set f/u appointment up  Admit date: 06/30/2012 Discharge date: 07/03/2012  Recommendations for Outpatient Follow-up:  1. Followup new diagnosis of cardiomyopathy, hypertensive versus ischemic 2. Continue treatment for uncontrolled hypertension 3. Continue to encourage smoking cessation   Follow-up Information    Follow up with PCP. (follow-up in 1 week)       Follow up with Corky Crafts., MD. (office will contact you for appointment)    Contact information:   [address] [place] [address] [phone]         Discharge Diagnoses:  1. Cardiomyopathy, new diagnosis systolic heart failure 2. Acute systolic heart failure 3. Accelerated hypertension, suspected hypertensive urgency or emergency on admission 4. Uncontrolled Hypertension 5. Smoker  Discharge Condition: Improved Disposition: Home  Diet recommendation: Heart healthy  Filed Weights   07/01/12 0442 07/02/12 0531  Weight: 117.935 kg (260 lb) 116.393 kg (256 lb 9.6 oz)    History of present illness:  37 y.o. female with hx of long standing HTN dated back to when she was 64 y o, hx of noncompliant to medication where she has been off her meds for at least a year, presents to the ER with SOB. She denied any chest pain. Evaluation in the ER included an unremarkable labs, with normal renal fx, normal WBC, and a normal CXR. Because of concern for a PE, a CTPA was done, which excluded a PE, but suggest early pulmonary edema. In the ER, she was found to be severely HTN, and IV lopressor along with PO Lisinopril was given. Hospitalist was asked to admit her for HTN pulmonary edema.  Hospital Course:  Ms. Alver Sorrow was admitted to the medical floor and treated for pulmonary edema.  Symptoms improved with diuresis and patient had no chest pain. She did have a history of noncompliance with medications. Hypertension improved with medications and diuresis. 2-D echocardiogram revealed cardiomyopathy, new and cardiology was consulted. Dr. Eldridge Dace recommended cardiac catheterization but thought this could be deferred to the outpatient setting and he will arrange followup. Recommend continue beta blocker, ACE inhibitor and diuretics. Patient does have a primary care physician and I have stressed to her that she should establish with a physician in followup next week.  1. Cardiomyopathy, new diagnosis systolic heart failure: Asymptomatic. Discussed with Dr. Cydney Ok hypertensive etiology rather than ischemic, however wall motion abnormalities raise concern for ischemic etiology. Per discussion with him okay for discharge, outpatient followup which he will arrange for outpatient catheterization. Lasix, beta blocker, ACE inhibitor. Add aspirin.  2. Acute systolic heart failure: Plan as above. Appears asymptomatic, compensated at this point.  3. Accelerated hypertension, suspected hypertensive urgency or emergency on admission: Resolved with treatment.  4. Uncontrolled Hypertension: Diagnosed approximately age 13. Noncompliant with medications. Much improved as above.  5. Smoker: Recommend cessation.  Consultants:  [facility] cardiology  Procedures:  2-D echocardiogram: Left ventricle: The cavity size was normal. There was moderate concentric hypertrophy. Systolic function was moderately reduced. The estimated ejection fraction was in the range of 35% to 40%. There is severe hypokinesis to akinesis of theanterior and anteroseptal walls to the apex, consistent with LAD territory ischemia / infarct. Doppler parameters are consistent with pseudonormal left ventricular relaxation (grade2 diastolic dysfunction). The E/A ratio is >1.5. The E/e' ratio  is >15, suggesting markedly elevated LV  filling pressure.  Discharge Instructions  Discharge Orders    Future Orders Please Complete By Expires   Diet - low sodium heart healthy      Activity as tolerated - No restrictions      Discharge instructions      Comments:   Be sure to followup with your primary care physician in the next week. The cardiology office will contact you for followup. You were started on new medications to control your blood pressure and for your heart. Start weighing herself daily and keep a log. Call your physician or seek immediate medical attention for recurrent chest pain or worsening of condition. Call your physician for abnormal weight gain.       Medication List     As of 07/03/2012  1:10 PM    STOP taking these medications         TYLENOL PO      TAKE these medications         aspirin 81 MG EC tablet   Take 1 tablet (81 mg total) by mouth daily.      furosemide 20 MG tablet   Commonly known as: LASIX   Take 1 tablet (20 mg total) by mouth daily.      lisinopril 10 MG tablet   Commonly known as: PRINIVIL,ZESTRIL   Take 1 tablet (10 mg total) by mouth daily.      metoprolol 50 MG tablet   Commonly known as: LOPRESSOR   Take 1 tablet (50 mg total) by mouth 2 (two) times daily.      nitroGLYCERIN 0.4 MG SL tablet   Commonly known as: NITROSTAT   Place 1 tablet (0.4 mg total) under the tongue every 5 (five) minutes as needed for chest pain.         The results of significant diagnostics from this hospitalization (including imaging, microbiology, ancillary and laboratory) are listed below for reference.    Significant Diagnostic Studies: Dg Chest 2 View  06/30/2012  *RADIOLOGY REPORT*  Clinical Data: Shortness of breath and weakness.  CHEST - 2 VIEW  Comparison: None.  Findings: Lungs are clear.  Heart size upper normal.  No pneumothorax or pleural effusion.  IMPRESSION: Negative chest.   Original Report Authenticated By: Holley Dexter, M.D.    Ct Angio Chest Pe W/cm &/or Wo  Cm  07/01/2012  *RADIOLOGY REPORT*  Clinical Data:  37 year old with shortness of breath.  CT ANGIOGRAPHY CHEST  Technique:  Multidetector CT imaging of the chest using the standard protocol during bolus administration of intravenous contrast. Multiplanar reconstructed images including MIPs were obtained and reviewed to evaluate the vascular anatomy.  Contrast: OMNIPAQUE IOHEXOL 350 MG/ML SOLN  Comparison: Chest radiograph 06/30/2012  Findings: This is a limited examination due to excessive patient breathing throughout the examination.  There is no evidence for a large or central pulmonary embolism.  The upper abdomen is unremarkable.  No significant pericardial or pleural fluid.  There is no significant mediastinal, hilar or axillary lymphadenopathy.  The trachea and mainstem bronchi are patent.   There is diffuse haziness throughout both lungs which is probably accentuated by the motion artifact.  Findings are also suggestive for atelectasis and cannot exclude some pulmonary edema. No acute bony abnormality.  IMPRESSION: The examination is limited due to excessive motion.  There is no evidence for a large or central pulmonary embolism but difficult to evaluate the lobar and more distal vessels.  Diffuse haziness throughout  both lungs is suggestive for atelectasis and cannot exclude pulmonary edema.   Original Report Authenticated By: Richarda Overlie, M.D.     Labs: Basic Metabolic Panel:  Lab 07/02/12 4098 06/30/12 2110  NA 136 138  K 3.4* 3.5  CL 102 102  CO2 23 23  GLUCOSE 116* 108*  BUN 10 8  CREATININE 0.68 0.68  CALCIUM 8.7 9.3  MG -- --  PHOS -- --  CBC:  Lab 07/02/12 0640 06/30/12 2110  WBC 7.3 10.9*  NEUTROABS -- --  HGB 12.2 13.6  HCT 37.0 40.2  MCV 86.4 87.4  PLT 279 376   Cardiac Enzymes:  Lab 07/02/12 1415 06/30/12 2338  CKTOTAL -- --  CKMB -- --  CKMBINDEX -- --  TROPONINI <0.30 <0.30    Basename 06/30/12 2338  PROBNP 462.6*   Principal Problem:  *HTN  (hypertension), malignant Active Problems:  Pulmonary edema  Back pain, acute  Medically noncompliant  Tobacco use disorder  Cardiomyopathy  Systolic heart failure   Time coordinating discharge: 25 minutes  Signed:  Brendia Sacks, MD [facility] 07/03/2012, 1:10 PM

## 2012-07-12 HISTORY — PX: CARDIAC CATHETERIZATION: SHX172

## 2012-07-22 ENCOUNTER — Other Ambulatory Visit: Payer: Self-pay | Admitting: Interventional Cardiology

## 2012-07-23 ENCOUNTER — Encounter (HOSPITAL_BASED_OUTPATIENT_CLINIC_OR_DEPARTMENT_OTHER)
Admission: RE | Disposition: A | Discharge status: Home / Self Care | Payer: Self-pay | Source: Ambulatory Visit | Attending: Interventional Cardiology

## 2012-07-23 ENCOUNTER — Inpatient Hospital Stay (HOSPITAL_BASED_OUTPATIENT_CLINIC_OR_DEPARTMENT_OTHER)
Admission: RE | Admit: 2012-07-23 | Discharge: 2012-07-23 | Disposition: A | Discharge status: Home / Self Care | Payer: BC Managed Care – PPO | Source: Ambulatory Visit | Attending: Interventional Cardiology | Admitting: Interventional Cardiology

## 2012-07-23 DIAGNOSIS — I5020 Unspecified systolic (congestive) heart failure: Secondary | ICD-10-CM

## 2012-07-23 DIAGNOSIS — I429 Cardiomyopathy, unspecified: Secondary | ICD-10-CM

## 2012-07-23 DIAGNOSIS — I10 Essential (primary) hypertension: Secondary | ICD-10-CM

## 2012-07-23 DIAGNOSIS — I428 Other cardiomyopathies: Secondary | ICD-10-CM | POA: Insufficient documentation

## 2012-07-23 DIAGNOSIS — J811 Chronic pulmonary edema: Secondary | ICD-10-CM

## 2012-07-23 DIAGNOSIS — Z9889 Other specified postprocedural states: Secondary | ICD-10-CM

## 2012-07-23 HISTORY — DX: Other specified postprocedural states: Z98.890

## 2012-07-23 SURGERY — JV LEFT HEART CATHETERIZATION WITH CORONARY ANGIOGRAM
Anesthesia: Moderate Sedation

## 2012-07-23 MED ORDER — SODIUM CHLORIDE 0.9 % IV SOLN
INTRAVENOUS | Status: DC
  Administered 2012-07-23: 12:00:00 via INTRAVENOUS

## 2012-07-23 MED ORDER — SODIUM CHLORIDE 0.9 % IJ SOLN: 3.0000 mL | INTRAMUSCULAR | Status: DC | PRN

## 2012-07-23 MED ORDER — LISINOPRIL 20 MG PO TABS: 20.0000 mg | ORAL_TABLET | Freq: Every day | ORAL | Status: DC

## 2012-07-23 MED ORDER — DIAZEPAM 5 MG PO TABS
5.0000 mg | ORAL_TABLET | ORAL | Status: AC
  Administered 2012-07-23: 5 mg via ORAL

## 2012-07-23 MED ORDER — SODIUM CHLORIDE 0.9 % IV SOLN: 250.0000 mL | INTRAVENOUS | Status: DC | PRN

## 2012-07-23 MED ORDER — SODIUM CHLORIDE 0.9 % IJ SOLN: 3.0000 mL | Freq: Two times a day (BID) | INTRAMUSCULAR | Status: DC

## 2012-07-23 MED ORDER — ASPIRIN 81 MG PO CHEW
324.0000 mg | CHEWABLE_TABLET | ORAL | Status: AC
  Administered 2012-07-23: 324 mg via ORAL

## 2012-07-23 MED ORDER — SODIUM CHLORIDE 0.9 % IV SOLN: 1.0000 mL/kg/h | INTRAVENOUS | Status: AC

## 2012-07-23 MED ORDER — ONDANSETRON HCL 4 MG/2ML IJ SOLN: 4.0000 mg | Freq: Four times a day (QID) | INTRAMUSCULAR | Status: DC | PRN

## 2012-07-23 NOTE — OR Nursing (Signed)
Discharge instructions reviewed and signed, pt stated understanding, ambulated in hall without difficulty, site level 0, transported to friend's car via wheelchair 

## 2012-07-23 NOTE — OR Nursing (Signed)
Tegaderm dressing applied, site level 0, bedrest begins at 1305

## 2012-07-23 NOTE — CV Procedure (Signed)
PROCEDURE:  Left heart catheterization with selective coronary angiography, left ventriculogram. Abdominal aortogram.  INDICATIONS:  Cardiomyopathy  The risks, benefits, and details of the procedure were explained to the patient.  The patient verbalized understanding and wanted to proceed.  Informed written consent was obtained.  PROCEDURE TECHNIQUE:  After Xylocaine anesthesia a 17F sheath was placed in the right femoral artery with a single anterior needle wall stick.   Left coronary angiography was done using a Judkins L4 guide catheter.  Right coronary angiography was done using a Judkins R4 guide catheter.  Left ventriculography was done using a pigtail catheter.    CONTRAST:  Total of 90 cc.  COMPLICATIONS:  None.    HEMODYNAMICS:  Aortic pressure was 165/78; LV pressure was 165/16; LVEDP 24.  There was no gradient between the left ventricle and aorta.    ANGIOGRAPHIC DATA:   The left main coronary artery is widely patent.  The left anterior descending artery is a large vessel which wraps around the apex.  There are three small diagonals.  The entire LAD systems appears angiographically normal.  The left circumflex artery is a large codominant vessel.  The OM1 is medium sized and is patent.The left PDA is patent.  The right coronary artery is a medium sized codominant artery.  THe posterolateral artery is small and patent.  The PDA is large and widely patent.  LEFT VENTRICULOGRAM:  Left ventricular angiogram was done in the 30 RAO projection and revealed global left ventricular dysfunction, with severe inferobasal hypokinesis.  The estimated ejection fraction is 30-35%.  LVEDP was 24 mmHg.  ABDOMINAL AORTOGRAM:  No AAA. Bilateral single renal arteries which are widely patent.  Aortoiliac bifurcation is widely patent.   IMPRESSIONS:  1. Normal left main coronary artery. 2. Normal left anterior descending artery and its branches. 3. Normal left circumflex artery and its  branches. 4. Normal right coronary artery. 5. Moderate decreased left ventricular systolic function.  LVEDP 24 mmHg.  Ejection fraction 30-35%.  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  I stressed the importance of taking her BP meds to help improve her LV function.  Will increase lisinopril to 20 mg daily.  She states that she has no intentions of getting pregnant, and has polycystic ovarian disease making pregnancy unlikely.  Will check urine pregnancy test since it was not checked at her last hospitalization.  Continue beta blocker.  Followup in the office with me in 2-3 weeks.

## 2012-07-23 NOTE — OR Nursing (Signed)
Dr Varanasi at bedside to discuss results and treatment plan with pt and family 

## 2012-07-23 NOTE — Progress Notes (Signed)
Positive Allen's test to right hand, spo2 95%.

## 2013-07-02 ENCOUNTER — Emergency Department (HOSPITAL_COMMUNITY)
Admission: EM | Admit: 2013-07-02 | Discharge: 2013-07-03 | Disposition: A | Discharge status: Home / Self Care | Payer: BC Managed Care – PPO | Attending: Emergency Medicine | Admitting: Emergency Medicine

## 2013-07-02 ENCOUNTER — Emergency Department (HOSPITAL_COMMUNITY): Payer: BC Managed Care – PPO

## 2013-07-02 ENCOUNTER — Encounter (HOSPITAL_COMMUNITY): Payer: Self-pay | Admitting: Emergency Medicine

## 2013-07-02 DIAGNOSIS — Y9301 Activity, walking, marching and hiking: Secondary | ICD-10-CM | POA: Insufficient documentation

## 2013-07-02 DIAGNOSIS — I10 Essential (primary) hypertension: Secondary | ICD-10-CM | POA: Insufficient documentation

## 2013-07-02 DIAGNOSIS — Z79899 Other long term (current) drug therapy: Secondary | ICD-10-CM | POA: Insufficient documentation

## 2013-07-02 DIAGNOSIS — Z3202 Encounter for pregnancy test, result negative: Secondary | ICD-10-CM | POA: Insufficient documentation

## 2013-07-02 DIAGNOSIS — Y92009 Unspecified place in unspecified non-institutional (private) residence as the place of occurrence of the external cause: Secondary | ICD-10-CM | POA: Insufficient documentation

## 2013-07-02 DIAGNOSIS — W010XXA Fall on same level from slipping, tripping and stumbling without subsequent striking against object, initial encounter: Secondary | ICD-10-CM | POA: Insufficient documentation

## 2013-07-02 DIAGNOSIS — S82892A Other fracture of left lower leg, initial encounter for closed fracture: Secondary | ICD-10-CM

## 2013-07-02 DIAGNOSIS — S82853A Displaced trimalleolar fracture of unspecified lower leg, initial encounter for closed fracture: Secondary | ICD-10-CM | POA: Insufficient documentation

## 2013-07-02 DIAGNOSIS — Z7982 Long term (current) use of aspirin: Secondary | ICD-10-CM | POA: Insufficient documentation

## 2013-07-02 DIAGNOSIS — F17200 Nicotine dependence, unspecified, uncomplicated: Secondary | ICD-10-CM | POA: Insufficient documentation

## 2013-07-02 LAB — POCT PREGNANCY, URINE: Preg Test, Ur: NEGATIVE

## 2013-07-02 IMAGING — CR DG ANKLE COMPLETE 3+V*L*
3 series · 3 of 3 positions shown · non-contrast
Comparison: None.

CLINICAL DATA: Status post fall with lower left leg deformity

EXAM:
LEFT ANKLE COMPLETE - 3+ VIEW

[view not recorded (1 of 3)]
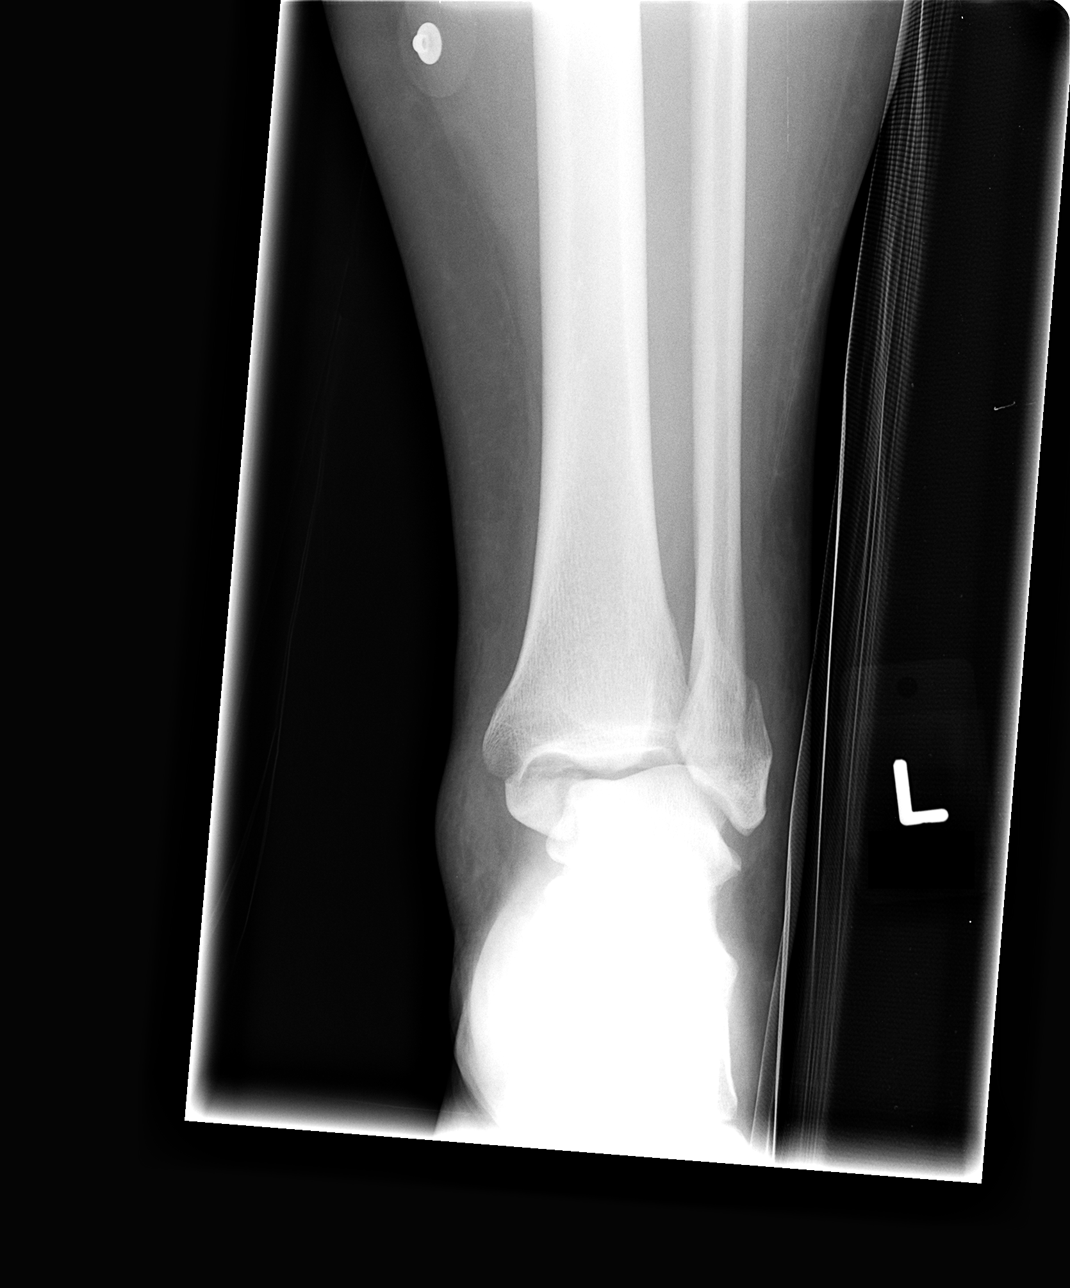

[view not recorded (2 of 3)]
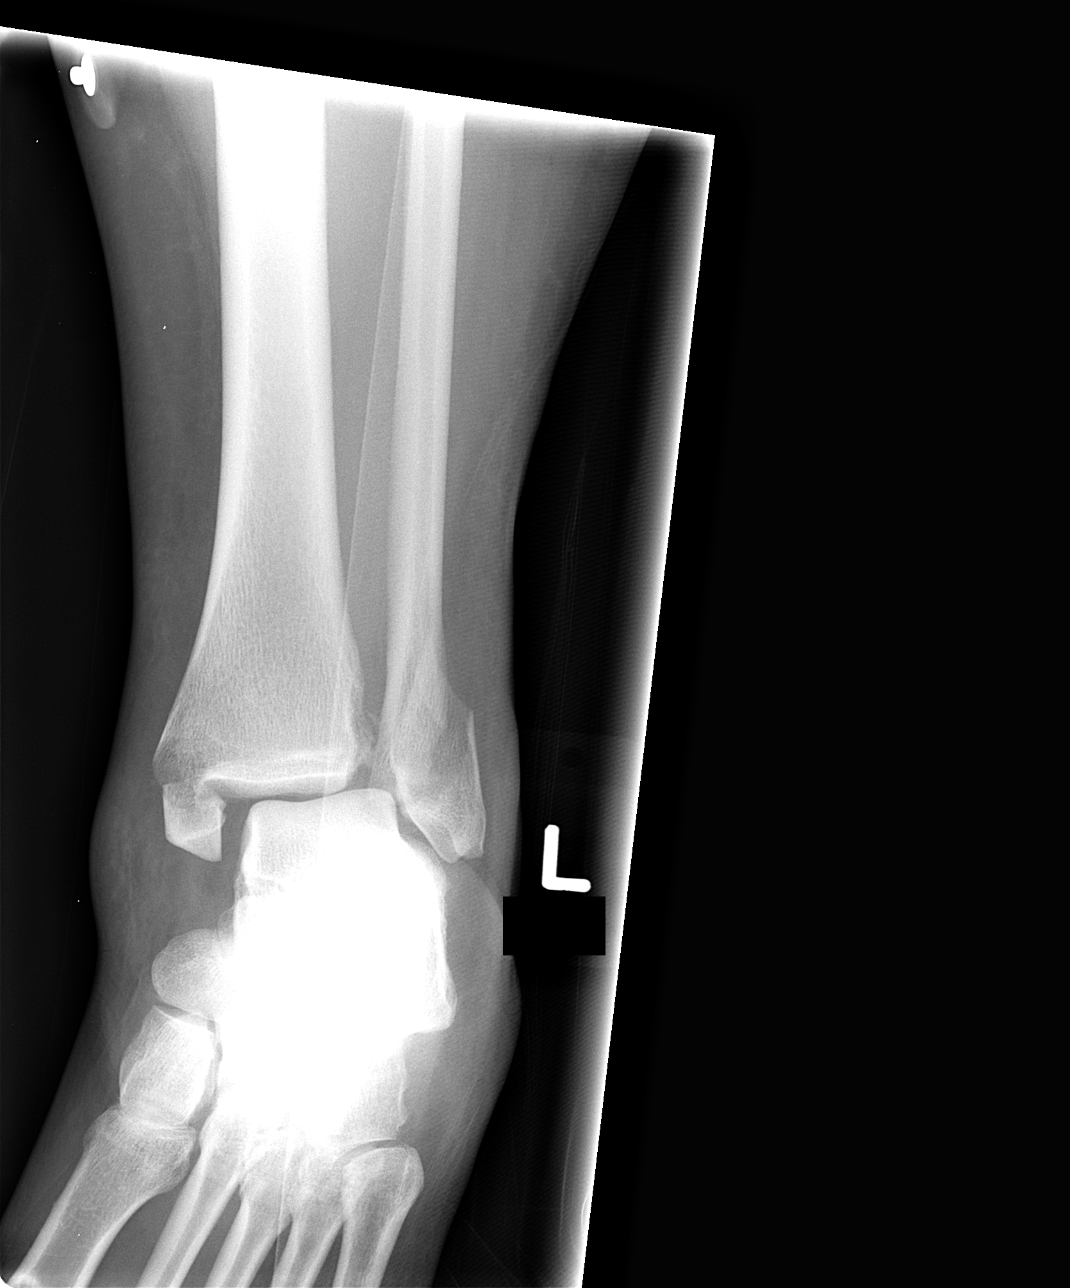

[view not recorded (3 of 3)]
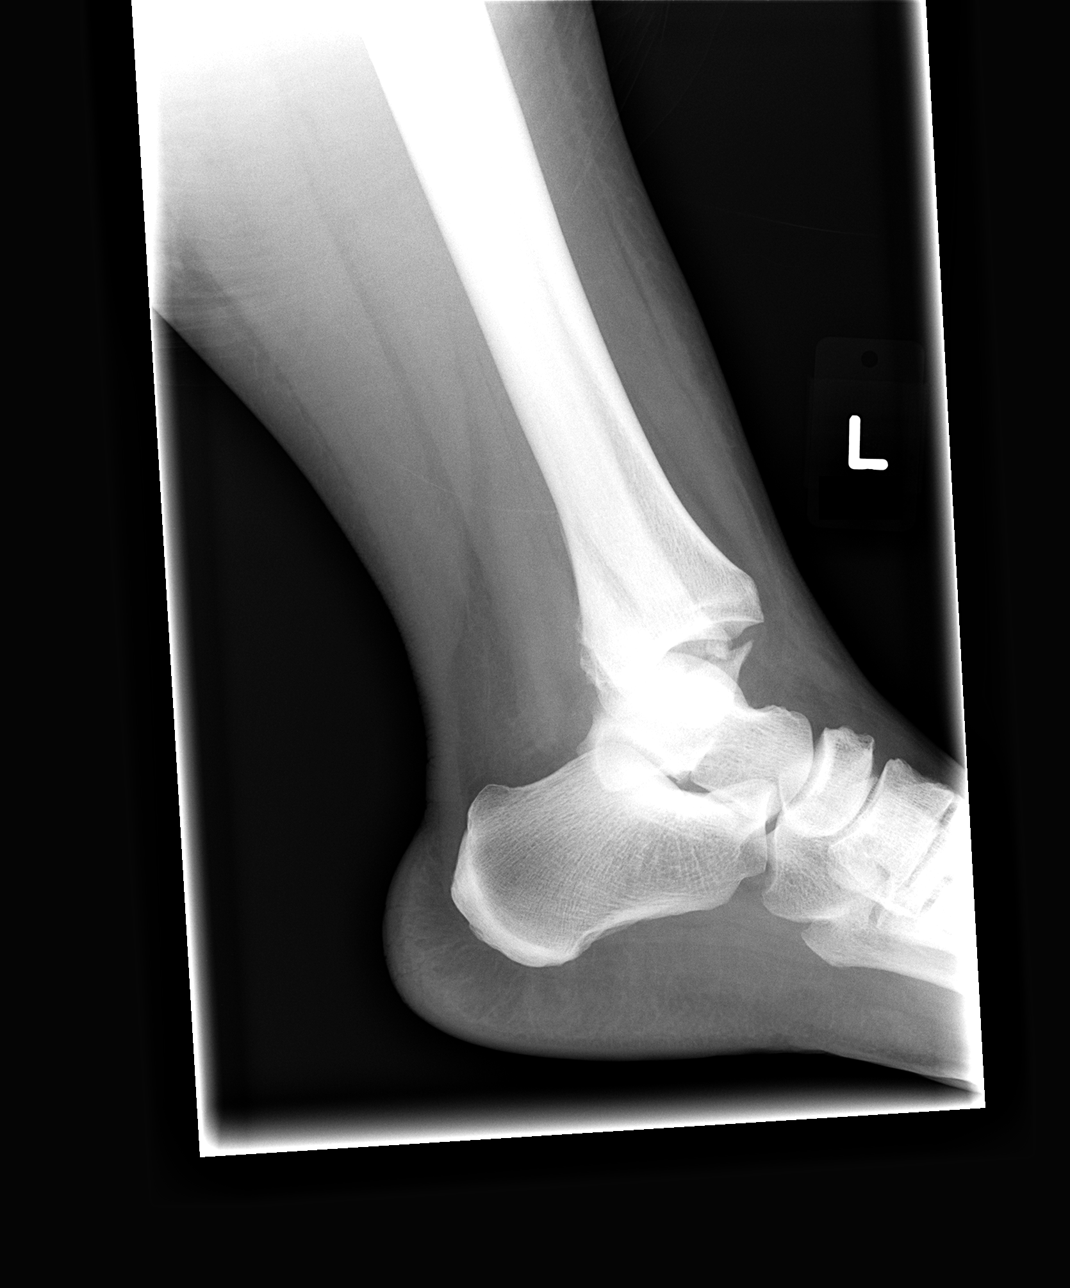

[3 of 3 positions shown; findings below may reference images not displayed]

FINDINGS: There is displaced fracture of the distal fibula. There is displaced
fracture of the medial malleolus. There are small bony fragments
between the distal tibia and fibula, question origin. There is ankle
mortise disruption. There is mild dislocation of the distal tibia
relative to the talus.
IMPRESSION: Fractures of distal tibia and fibula as described. There is ankle
mortise disruption with mild dislocation of distal tibia relative to
the talus.

## 2013-07-02 MED ORDER — FENTANYL CITRATE 0.05 MG/ML IJ SOLN
50.0000 ug | Freq: Once | INTRAMUSCULAR | Status: AC
  Administered 2013-07-02: 50 ug via INTRAVENOUS

## 2013-07-02 MED ORDER — FENTANYL CITRATE 0.05 MG/ML IJ SOLN
100.0000 ug | Freq: Once | INTRAMUSCULAR | Status: AC
  Administered 2013-07-02: 100 ug via INTRAVENOUS
  Filled 2013-07-02: qty 2

## 2013-07-02 MED ORDER — FENTANYL CITRATE 0.05 MG/ML IJ SOLN
INTRAMUSCULAR | Status: AC
  Filled 2013-07-02: qty 2

## 2013-07-02 MED ORDER — HYDROCODONE-ACETAMINOPHEN 5-325 MG PO TABS: 1.0000 | ORAL_TABLET | ORAL | Status: DC | PRN

## 2013-07-02 MED ORDER — FENTANYL CITRATE 0.05 MG/ML IJ SOLN
50.0000 ug | Freq: Once | INTRAMUSCULAR | Status: AC
  Administered 2013-07-02: 50 ug via INTRAVENOUS
  Filled 2013-07-02: qty 2

## 2013-07-02 MED ORDER — HYDROCODONE-ACETAMINOPHEN 5-325 MG PO TABS
1.0000 | ORAL_TABLET | Freq: Once | ORAL | Status: AC
  Administered 2013-07-02: 1 via ORAL
  Filled 2013-07-02: qty 1

## 2013-07-02 NOTE — ED Notes (Signed)
Pt from home with c/o fall.  Pt tripped over pine straw at home and fell in prone position.  Pt c/o pain to left ankle; obvious deformity.  Pt given 75mcg of Fentanyl PTA. Pulse intact to extremity. Ankle immobilized.

## 2013-07-02 NOTE — Progress Notes (Signed)
Orthopedic Tech Progress Note Patient Details:  Tammy Hogan 09-23-1975 [id number]  Ortho Devices Type of Ortho Device: Ace wrap;Post (short leg) splint;Stirrup splint Ortho Device/Splint Location: lle Ortho Device/Splint Interventions: Application rn will call ortho when pt is able to get up and try crutches  Tammy Hogan 07/02/2013, 10:31 PM

## 2013-07-02 NOTE — ED Notes (Signed)
Pt's ankle reduced with ED resident, ortho techs and RN at bedside.  VSS throughout reduction.

## 2013-07-02 NOTE — ED Notes (Signed)
Pt reports feeling "woozy" while ortho techs attempted to teach pt how to ambulate with crutches.  Will attempt crutches training after pain medication starts to wear off.  Pt verbalized understanding and stated she would inform us when she started to feel better.

## 2013-07-02 NOTE — ED Provider Notes (Addendum)
CSN: [id number]     Arrival date & time 07/02/13  1939 History   First MD Initiated Contact with Patient 07/02/13 1941     Chief Complaint  Patient presents with  . Fall   (Consider location/radiation/quality/duration/timing/severity/associated sxs/prior Treatment) Patient is a 38 y.o. female presenting with fall. The history is provided by the patient and the EMS personnel.  Fall This is a new problem. The current episode started today. The problem occurs constantly. The problem has been unchanged. Associated symptoms include arthralgias, joint swelling and myalgias. Pertinent negatives include no abdominal pain, chest pain, chills, congestion, coughing, fever, headaches, nausea, numbness or vomiting. Nothing aggravates the symptoms. She has tried nothing for the symptoms. The treatment provided moderate relief.    Past Medical History  Diagnosis Date  . Hypertension    History reviewed. No pertinent past surgical history. History reviewed. No pertinent family history. History  Substance Use Topics  . Smoking status: Current Every Day Smoker -- 0.50 packs/day  . Smokeless tobacco: Not on file  . Alcohol Use: Yes   OB History   Grav Para Term Preterm Abortions TAB SAB Ect Mult Living                 Review of Systems  Constitutional: Negative for fever and chills.  HENT: Negative for congestion and rhinorrhea.   Eyes: Negative for redness and visual disturbance.  Respiratory: Negative for cough, shortness of breath and wheezing.   Cardiovascular: Negative for chest pain and palpitations.  Gastrointestinal: Negative for nausea, vomiting and abdominal pain.  Genitourinary: Negative for dysuria and urgency.  Musculoskeletal: Positive for arthralgias, gait problem, joint swelling and myalgias. Negative for back pain.  Skin: Negative for pallor and wound.  Neurological: Negative for dizziness, numbness and headaches.    Allergies  Percocet  Home Medications   Current  Outpatient Rx  Name  Route  Sig  Dispense  Refill  . aspirin EC 81 MG tablet   Oral   Take 81 mg by mouth daily.         . diphenhydramine-acetaminophen (TYLENOL PM) 25-500 MG TABS   Oral   Take 1 tablet by mouth at bedtime as needed (for sleep).         . furosemide (LASIX) 20 MG tablet   Oral   Take 20 mg by mouth daily.         [name] lisinopril (PRINIVIL,ZESTRIL) 20 MG tablet   Oral   Take 20 mg by mouth daily.         . metoprolol succinate (TOPROL-XL) 100 MG 24 hr tablet   Oral   Take 100 mg by mouth daily. Take with or immediately following a meal.         . nitroGLYCERIN (NITROSTAT) 0.4 MG SL tablet   Sublingual   Place 0.4 mg under the tongue every 5 (five) minutes as needed for chest pain.         [name] HYDROcodone-acetaminophen (NORCO/VICODIN) 5-325 MG per tablet   Oral   Take 1 tablet by mouth every 4 (four) hours as needed.   20 tablet   0    BP 189/99  Pulse 97  Temp(Src) 98.1 F (36.7 C) (Oral)  Resp 16  SpO2 98%  LMP 07/02/2013 Physical Exam  Constitutional: She is oriented to person, place, and time. She appears well-developed and well-nourished. No distress.  HENT:  Head: Normocephalic and atraumatic.  Eyes: EOM are normal. Pupils are equal, round, and reactive to light.  Neck: Normal range of motion. Neck supple.  Cardiovascular: Normal rate and regular rhythm.  Exam reveals no gallop and no friction rub.   No murmur heard. Pulmonary/Chest: Effort normal. She has no wheezes. She has no rales.  Abdominal: Soft. She exhibits no distension. There is no tenderness.  Musculoskeletal: She exhibits edema and tenderness.  Pain and swelling most prominent to the medial malleolus of the left ankle. Patient has 2+ DP pulse days and has intact sensation in all nerve distributions.  Neurological: She is alert and oriented to person, place, and time.  Skin: Skin is warm and dry. She is not diaphoretic.  Psychiatric: She has a normal mood and affect. Her  behavior is normal.    ED Course  Reduction of fracture Date/Time: 07/03/2013 12:39 AM Performed by: Tyrone Nine Xitlalli Newhard Authorized by: Deno Etienne Consent: Verbal consent obtained. Risks and benefits: risks, benefits and alternatives were discussed Consent given by: patient Patient understanding: patient states understanding of the procedure being performed Patient consent: the patient's understanding of the procedure matches consent given Imaging studies: imaging studies available Required items: required blood products, implants, devices, and special equipment available Patient identity confirmed: hospital-assigned identification number Time out: Immediately prior to procedure a "time out" was called to verify the correct patient, procedure, equipment, support staff and site/side marked as required. Local anesthesia used: no Patient sedated: no Patient tolerance: Patient tolerated the procedure well with no immediate complications. Comments: Left ankle   (including critical care time) Labs Review Labs Reviewed  POCT PREGNANCY, URINE   Imaging Review Dg Tibia/fibula Left  07/02/2013   CLINICAL DATA:  Status post fall with left lower leg deformity  EXAM: LEFT TIBIA AND FIBULA - 2 VIEW  COMPARISON:  None.  FINDINGS: There are displaced fractures of the distal tibia and fibula. There are no fractures of the proximal and mid tibia and fibula.  IMPRESSION: Fractures of distal tibia and fibula.   Electronically Signed   By: Abelardo Diesel M.D.   On: 07/02/2013 21:35   Dg Ankle Complete Left  07/02/2013   CLINICAL DATA:  Status post fall with lower left leg deformity  EXAM: LEFT ANKLE COMPLETE - 3+ VIEW  COMPARISON:  None.  FINDINGS: There is displaced fracture of the distal fibula. There is displaced fracture of the medial malleolus. There are small bony fragments between the distal tibia and fibula, question origin. There is ankle mortise disruption. There is mild dislocation of the distal tibia  relative to the talus.  IMPRESSION: Fractures of distal tibia and fibula as described. There is ankle mortise disruption with mild dislocation of distal tibia relative to the talus.   Electronically Signed   By: Abelardo Diesel M.D.   On: 07/02/2013 21:29   Dg Foot Complete Left  07/02/2013   CLINICAL DATA:  Status post fall with lower left leg deformity  EXAM: LEFT FOOT - COMPLETE 3+ VIEW  COMPARISON:  None.  FINDINGS: There are fractures of the distal tibia and fibula. No other acute fracture or dislocation is identified within the left foot.  IMPRESSION: There are fractures of the distal tibia and fibula. No other acute fracture or dislocation is identified within the left foot.   Electronically Signed   By: Abelardo Diesel M.D.   On: 07/02/2013 21:32    EKG Interpretation   None       MDM   1. Closed left ankle fracture      Patient is a 38 year old female with the chief complaint  of left ankle pain. Patient was walking into her house and slipped on some pine straw out front landed facedown. Patient denies any head injury denies any loss of consciousness. Patient with pain to the left ankle unable to and a late meal he afterwards. Called EMS to transport her here. Patient had 50 mg of Demerol with positive improvement of pain.  Patient with intact pulse motor and sensation in all nerve distributions to the left foot. We will obtain any plain film. There is no noted open wounds.   Patient found to have a tri- malleolus fracture with widening of the medial joint space.  Will consult ortho.    Dr. Ninfa Linden on over-the-counter noted will followup in clinic in one week.  Patient injury reduced at bedside patient placed in stirrups and posterior slab splint.  Postreduction x-rays show improvement.   11:30 PM:  I have discussed the diagnosis/risks/treatment options with the patient and believe the pt to be eligible for discharge home to follow-up with Ortho. We also discussed returning to the  ED immediately if new or worsening sx occur. We discussed the sx which are most concerning (e.g., repeat injury) that necessitate immediate return. Any new prescriptions provided to the patient are listed below.  New Prescriptions   HYDROCODONE-ACETAMINOPHEN (NORCO/VICODIN) 5-325 MG PER TABLET    Take 1 tablet by mouth every 4 (four) hours as needed.     Deno Etienne, MD 07/02/13 [place], MD 07/03/13 [phone]

## 2013-07-03 NOTE — ED Provider Notes (Signed)
I saw and evaluated the patient, reviewed the resident's note and I agree with the findings and plan.  EKG Interpretation   None       PT with mechanical fall, L ankle fracture, reduced and splinted by Dr. Tyrone Nine. I was available for key portions of the procedure. Ortho Followup.   Judy Pollman B. Karle Starch, MD 07/03/13 Laureen Abrahams

## 2013-07-09 ENCOUNTER — Other Ambulatory Visit (HOSPITAL_COMMUNITY): Payer: Self-pay | Admitting: Orthopaedic Surgery

## 2013-07-10 ENCOUNTER — Encounter (HOSPITAL_COMMUNITY): Payer: Self-pay | Admitting: *Deleted

## 2013-07-10 ENCOUNTER — Ambulatory Visit (HOSPITAL_COMMUNITY): Payer: BC Managed Care – PPO

## 2013-07-10 ENCOUNTER — Encounter (HOSPITAL_COMMUNITY)
Admission: RE | Disposition: A | Discharge status: Home / Self Care | Payer: Self-pay | Source: Ambulatory Visit | Attending: Orthopaedic Surgery

## 2013-07-10 ENCOUNTER — Ambulatory Visit (HOSPITAL_COMMUNITY): Payer: BC Managed Care – PPO | Admitting: Anesthesiology

## 2013-07-10 ENCOUNTER — Observation Stay (HOSPITAL_COMMUNITY)
Admission: RE | Admit: 2013-07-10 | Discharge: 2013-07-11 | Disposition: A | Discharge status: Home / Self Care | Payer: BC Managed Care – PPO | Source: Ambulatory Visit | Attending: Orthopaedic Surgery | Admitting: Orthopaedic Surgery

## 2013-07-10 ENCOUNTER — Encounter (HOSPITAL_COMMUNITY): Payer: BC Managed Care – PPO | Admitting: Anesthesiology

## 2013-07-10 DIAGNOSIS — I10 Essential (primary) hypertension: Secondary | ICD-10-CM | POA: Insufficient documentation

## 2013-07-10 DIAGNOSIS — J811 Chronic pulmonary edema: Secondary | ICD-10-CM

## 2013-07-10 DIAGNOSIS — F17200 Nicotine dependence, unspecified, uncomplicated: Secondary | ICD-10-CM | POA: Insufficient documentation

## 2013-07-10 DIAGNOSIS — Z9119 Patient's noncompliance with other medical treatment and regimen: Secondary | ICD-10-CM

## 2013-07-10 DIAGNOSIS — K219 Gastro-esophageal reflux disease without esophagitis: Secondary | ICD-10-CM | POA: Insufficient documentation

## 2013-07-10 DIAGNOSIS — I429 Cardiomyopathy, unspecified: Secondary | ICD-10-CM

## 2013-07-10 DIAGNOSIS — S82842A Displaced bimalleolar fracture of left lower leg, initial encounter for closed fracture: Secondary | ICD-10-CM

## 2013-07-10 DIAGNOSIS — Z23 Encounter for immunization: Secondary | ICD-10-CM | POA: Insufficient documentation

## 2013-07-10 DIAGNOSIS — M549 Dorsalgia, unspecified: Secondary | ICD-10-CM

## 2013-07-10 DIAGNOSIS — Z79899 Other long term (current) drug therapy: Secondary | ICD-10-CM | POA: Insufficient documentation

## 2013-07-10 DIAGNOSIS — S82843A Displaced bimalleolar fracture of unspecified lower leg, initial encounter for closed fracture: Principal | ICD-10-CM | POA: Insufficient documentation

## 2013-07-10 DIAGNOSIS — Z91199 Patient's noncompliance with other medical treatment and regimen due to unspecified reason: Secondary | ICD-10-CM

## 2013-07-10 DIAGNOSIS — W010XXA Fall on same level from slipping, tripping and stumbling without subsequent striking against object, initial encounter: Secondary | ICD-10-CM | POA: Insufficient documentation

## 2013-07-10 DIAGNOSIS — I5020 Unspecified systolic (congestive) heart failure: Secondary | ICD-10-CM

## 2013-07-10 HISTORY — DX: Angina pectoris, unspecified: I20.9

## 2013-07-10 HISTORY — PX: ORIF ANKLE FRACTURE: SHX5408

## 2013-07-10 HISTORY — DX: Depression, unspecified: F32.A

## 2013-07-10 HISTORY — DX: Displaced bimalleolar fracture of left lower leg, initial encounter for closed fracture: S82.842A

## 2013-07-10 HISTORY — DX: Gastro-esophageal reflux disease without esophagitis: K21.9

## 2013-07-10 HISTORY — DX: Other specified postprocedural states: Z98.890

## 2013-07-10 HISTORY — DX: Anxiety disorder, unspecified: F41.9

## 2013-07-10 HISTORY — DX: Nonspecific reaction to tuberculin skin test without active tuberculosis: R76.11

## 2013-07-10 HISTORY — DX: Major depressive disorder, single episode, unspecified: F32.9

## 2013-07-10 LAB — CBC
HCT: 38.8 % (ref 36.0–46.0)
HEMOGLOBIN: 12.5 g/dL (ref 12.0–15.0)
MCH: 29.1 pg (ref 26.0–34.0)
MCHC: 32.2 g/dL (ref 30.0–36.0)
MCV: 90.2 fL (ref 78.0–100.0)
Platelets: 459 10*3/uL — ABNORMAL HIGH (ref 150–400)
RBC: 4.3 MIL/uL (ref 3.87–5.11)
RDW: 13.7 % (ref 11.5–15.5)
WBC: 7 10*3/uL (ref 4.0–10.5)

## 2013-07-10 LAB — COMPREHENSIVE METABOLIC PANEL
ALBUMIN: 3.5 g/dL (ref 3.5–5.2)
ALK PHOS: 87 U/L (ref 39–117)
ALT: 9 U/L (ref 0–35)
AST: 17 U/L (ref 0–37)
BUN: 8 mg/dL (ref 6–23)
CO2: 26 mEq/L (ref 19–32)
Calcium: 9.2 mg/dL (ref 8.4–10.5)
Chloride: 102 mEq/L (ref 96–112)
Creatinine, Ser: 0.64 mg/dL (ref 0.50–1.10)
GFR calc non Af Amer: >90 mL/min (ref >90)
GLUCOSE: 83 mg/dL (ref 70–99)
POTASSIUM: 4.4 meq/L (ref 3.7–5.3)
Sodium: 140 mEq/L (ref 137–147)
Total Bilirubin: 0.5 mg/dL (ref 0.3–1.2)
Total Protein: 7.8 g/dL (ref 6.0–8.3)

## 2013-07-10 SURGERY — OPEN REDUCTION INTERNAL FIXATION (ORIF) ANKLE FRACTURE
Anesthesia: Regional | Site: Ankle | Laterality: Left

## 2013-07-10 MED ORDER — LABETALOL HCL 5 MG/ML IV SOLN
INTRAVENOUS | Status: DC | PRN
  Administered 2013-07-10: 2.5 mg via INTRAVENOUS
  Administered 2013-07-10 (×3): 5 mg via INTRAVENOUS
  Administered 2013-07-10: 2.5 mg via INTRAVENOUS

## 2013-07-10 MED ORDER — MIDAZOLAM HCL 2 MG/2ML IJ SOLN
1.0000 mg | INTRAMUSCULAR | Status: DC | PRN
  Administered 2013-07-10: 2 mg via INTRAVENOUS
  Filled 2013-07-10: qty 2

## 2013-07-10 MED ORDER — ZOLPIDEM TARTRATE 5 MG PO TABS: 5.0000 mg | ORAL_TABLET | Freq: Every evening | ORAL | Status: DC | PRN

## 2013-07-10 MED ORDER — MIDAZOLAM HCL 5 MG/5ML IJ SOLN
INTRAMUSCULAR | Status: DC | PRN
  Administered 2013-07-10: 1 mg via INTRAVENOUS

## 2013-07-10 MED ORDER — OXYCODONE-ACETAMINOPHEN 5-325 MG PO TABS: 1.0000 | ORAL_TABLET | ORAL | Status: DC | PRN

## 2013-07-10 MED ORDER — LIDOCAINE HCL (CARDIAC) 20 MG/ML IV SOLN
INTRAVENOUS | Status: DC | PRN
  Administered 2013-07-10: 100 mg via INTRAVENOUS

## 2013-07-10 MED ORDER — METHOCARBAMOL 100 MG/ML IJ SOLN
500.0000 mg | Freq: Four times a day (QID) | INTRAVENOUS | Status: DC | PRN
  Administered 2013-07-11: 500 mg via INTRAVENOUS
  Filled 2013-07-10: qty 5

## 2013-07-10 MED ORDER — HYDROMORPHONE HCL PF 1 MG/ML IJ SOLN
INTRAMUSCULAR | Status: AC
  Filled 2013-07-10: qty 1

## 2013-07-10 MED ORDER — OXYCODONE HCL 5 MG PO TABS: 5.0000 mg | ORAL_TABLET | ORAL | Status: DC | PRN

## 2013-07-10 MED ORDER — NITROGLYCERIN 0.4 MG SL SUBL: 0.4000 mg | SUBLINGUAL_TABLET | SUBLINGUAL | Status: DC | PRN

## 2013-07-10 MED ORDER — DIPHENHYDRAMINE HCL 12.5 MG/5ML PO ELIX: 12.5000 mg | ORAL_SOLUTION | ORAL | Status: DC | PRN

## 2013-07-10 MED ORDER — BUPIVACAINE HCL (PF) 0.5 % IJ SOLN
INTRAMUSCULAR | Status: AC
  Filled 2013-07-10: qty 30

## 2013-07-10 MED ORDER — ROPIVACAINE HCL 5 MG/ML IJ SOLN
INTRAMUSCULAR | Status: AC
  Filled 2013-07-10: qty 60

## 2013-07-10 MED ORDER — ONDANSETRON HCL 4 MG/2ML IJ SOLN
INTRAMUSCULAR | Status: DC | PRN
  Administered 2013-07-10: 4 mg via INTRAVENOUS

## 2013-07-10 MED ORDER — HYDROCODONE-ACETAMINOPHEN 5-325 MG PO TABS
1.0000 | ORAL_TABLET | ORAL | Status: DC | PRN
  Administered 2013-07-11 (×3): 2 via ORAL
  Filled 2013-07-10 (×3): qty 2

## 2013-07-10 MED ORDER — FENTANYL CITRATE 0.05 MG/ML IJ SOLN
INTRAMUSCULAR | Status: DC | PRN
  Administered 2013-07-10: 25 ug via INTRAVENOUS
  Administered 2013-07-10: 50 ug via INTRAVENOUS
  Administered 2013-07-10: 25 ug via INTRAVENOUS
  Administered 2013-07-10: 50 ug via INTRAVENOUS
  Administered 2013-07-10: 100 ug via INTRAVENOUS
  Administered 2013-07-10 (×2): 25 ug via INTRAVENOUS
  Administered 2013-07-10: 50 ug via INTRAVENOUS

## 2013-07-10 MED ORDER — CEFAZOLIN SODIUM 1-5 GM-% IV SOLN
1.0000 g | Freq: Four times a day (QID) | INTRAVENOUS | Status: AC
  Administered 2013-07-11 (×3): 1 g via INTRAVENOUS
  Filled 2013-07-10 (×3): qty 50

## 2013-07-10 MED ORDER — FENTANYL CITRATE 0.05 MG/ML IJ SOLN
50.0000 ug | INTRAMUSCULAR | Status: DC | PRN
  Administered 2013-07-10 (×2): 50 ug via INTRAVENOUS

## 2013-07-10 MED ORDER — METOCLOPRAMIDE HCL 10 MG PO TABS
5.0000 mg | ORAL_TABLET | Freq: Three times a day (TID) | ORAL | Status: DC | PRN
  Administered 2013-07-11: 10 mg via ORAL
  Filled 2013-07-10: qty 1

## 2013-07-10 MED ORDER — ONDANSETRON HCL 4 MG/2ML IJ SOLN
4.0000 mg | Freq: Four times a day (QID) | INTRAMUSCULAR | Status: DC | PRN
Start: 2013-07-10 — End: 2013-07-11
  Administered 2013-07-11 (×2): 4 mg via INTRAVENOUS
  Filled 2013-07-10 (×2): qty 2

## 2013-07-10 MED ORDER — FUROSEMIDE 20 MG PO TABS
20.0000 mg | ORAL_TABLET | Freq: Every day | ORAL | Status: DC
  Administered 2013-07-11 (×2): 20 mg via ORAL
  Filled 2013-07-10 (×2): qty 1

## 2013-07-10 MED ORDER — METHOCARBAMOL 500 MG PO TABS: 500.0000 mg | ORAL_TABLET | Freq: Four times a day (QID) | ORAL | Status: DC | PRN

## 2013-07-10 MED ORDER — METOCLOPRAMIDE HCL 5 MG/ML IJ SOLN
5.0000 mg | Freq: Three times a day (TID) | INTRAMUSCULAR | Status: DC | PRN
  Administered 2013-07-11: 10 mg via INTRAVENOUS
  Filled 2013-07-10: qty 2

## 2013-07-10 MED ORDER — MIDAZOLAM HCL 2 MG/2ML IJ SOLN
INTRAMUSCULAR | Status: AC
  Filled 2013-07-10: qty 2

## 2013-07-10 MED ORDER — DEXAMETHASONE SODIUM PHOSPHATE 10 MG/ML IJ SOLN
INTRAMUSCULAR | Status: DC | PRN
  Administered 2013-07-10: 10 mg via INTRAVENOUS

## 2013-07-10 MED ORDER — SODIUM CHLORIDE 0.9 % IV SOLN
INTRAVENOUS | Status: DC
  Administered 2013-07-11: via INTRAVENOUS

## 2013-07-10 MED ORDER — PROMETHAZINE HCL 25 MG/ML IJ SOLN: 6.2500 mg | INTRAMUSCULAR | Status: DC | PRN

## 2013-07-10 MED ORDER — LISINOPRIL 20 MG PO TABS
20.0000 mg | ORAL_TABLET | Freq: Every day | ORAL | Status: DC
  Administered 2013-07-11 (×2): 20 mg via ORAL
  Filled 2013-07-10 (×2): qty 1

## 2013-07-10 MED ORDER — PROPOFOL 10 MG/ML IV BOLUS
INTRAVENOUS | Status: DC | PRN
  Administered 2013-07-10: 100 mg via INTRAVENOUS

## 2013-07-10 MED ORDER — MORPHINE SULFATE 2 MG/ML IJ SOLN: 1.0000 mg | INTRAMUSCULAR | Status: DC | PRN

## 2013-07-10 MED ORDER — ROPIVACAINE HCL 5 MG/ML IJ SOLN
INTRAMUSCULAR | Status: DC | PRN
  Administered 2013-07-10: 25 mL via PERINEURAL
  Administered 2013-07-10: 20 mL via PERINEURAL

## 2013-07-10 MED ORDER — HYDROMORPHONE HCL PF 1 MG/ML IJ SOLN
0.2500 mg | INTRAMUSCULAR | Status: DC | PRN
  Administered 2013-07-10: 0.5 mg via INTRAVENOUS
  Administered 2013-07-10: 0.25 mg via INTRAVENOUS
  Administered 2013-07-10: 0.5 mg via INTRAVENOUS
  Administered 2013-07-10: 0.25 mg via INTRAVENOUS
  Administered 2013-07-10: 0.5 mg via INTRAVENOUS

## 2013-07-10 MED ORDER — CEFAZOLIN SODIUM-DEXTROSE 2-3 GM-% IV SOLR
INTRAVENOUS | Status: AC
  Filled 2013-07-10: qty 50

## 2013-07-10 MED ORDER — HYDROMORPHONE HCL PF 1 MG/ML IJ SOLN
INTRAMUSCULAR | Status: DC | PRN
  Administered 2013-07-10: 0.5 mg via INTRAVENOUS
  Administered 2013-07-10: 1 mg via INTRAVENOUS
  Administered 2013-07-10: 0.5 mg via INTRAVENOUS

## 2013-07-10 MED ORDER — ASPIRIN EC 325 MG PO TBEC: 325.0000 mg | DELAYED_RELEASE_TABLET | Freq: Two times a day (BID) | ORAL | Status: DC

## 2013-07-10 MED ORDER — LACTATED RINGERS IV SOLN
INTRAVENOUS | Status: DC
  Administered 2013-07-10 (×2): via INTRAVENOUS

## 2013-07-10 MED ORDER — SUCCINYLCHOLINE CHLORIDE 20 MG/ML IJ SOLN
INTRAMUSCULAR | Status: DC | PRN
  Administered 2013-07-10: 100 mg via INTRAVENOUS

## 2013-07-10 MED ORDER — MEPERIDINE HCL 50 MG/ML IJ SOLN: 6.2500 mg | INTRAMUSCULAR | Status: DC | PRN

## 2013-07-10 MED ORDER — HYDRALAZINE HCL 20 MG/ML IJ SOLN
INTRAMUSCULAR | Status: DC | PRN
  Administered 2013-07-10: 5 mg via INTRAVENOUS

## 2013-07-10 MED ORDER — BUPIVACAINE HCL (PF) 0.5 % IJ SOLN
INTRAMUSCULAR | Status: DC | PRN
  Administered 2013-07-10: 10 mL

## 2013-07-10 MED ORDER — METOPROLOL SUCCINATE ER 100 MG PO TB24
100.0000 mg | ORAL_TABLET | Freq: Every day | ORAL | Status: DC
  Administered 2013-07-11 (×2): 100 mg via ORAL
  Filled 2013-07-10 (×2): qty 1

## 2013-07-10 MED ORDER — 0.9 % SODIUM CHLORIDE (POUR BTL) OPTIME
TOPICAL | Status: DC | PRN
  Administered 2013-07-10: 1000 mL

## 2013-07-10 MED ORDER — CEFAZOLIN SODIUM-DEXTROSE 2-3 GM-% IV SOLR
2.0000 g | INTRAVENOUS | Status: AC
  Administered 2013-07-10: 2 g via INTRAVENOUS

## 2013-07-10 MED ORDER — HYDROMORPHONE HCL PF 1 MG/ML IJ SOLN
1.0000 mg | INTRAMUSCULAR | Status: DC | PRN
  Administered 2013-07-11: 1 mg via INTRAVENOUS
  Filled 2013-07-10: qty 1

## 2013-07-10 MED ORDER — FENTANYL CITRATE 0.05 MG/ML IJ SOLN
INTRAMUSCULAR | Status: AC
  Filled 2013-07-10: qty 2

## 2013-07-10 MED ORDER — PROMETHAZINE HCL 12.5 MG RE SUPP: 12.5000 mg | Freq: Four times a day (QID) | RECTAL | Status: DC | PRN

## 2013-07-10 MED ORDER — ONDANSETRON HCL 4 MG PO TABS: 4.0000 mg | ORAL_TABLET | Freq: Four times a day (QID) | ORAL | Status: DC | PRN

## 2013-07-10 SURGICAL SUPPLY — 55 items
BAG ZIPLOCK 12X15 (MISCELLANEOUS) ×2 IMPLANT
BANDAGE GAUZE ELAST BULKY 4 IN (GAUZE/BANDAGES/DRESSINGS) ×4 IMPLANT
BIT DRILL 2.7 QC CANN 155 (BIT) ×2 IMPLANT
BIT DRILL 3.5 QC 155 (BIT) ×2 IMPLANT
BIT DRILL QC 2.7 6.3IN  SHORT (BIT) ×1
BIT DRILL QC 2.7 6.3IN SHORT (BIT) ×1 IMPLANT
BNDG COHESIVE 4X5 TAN STRL (GAUZE/BANDAGES/DRESSINGS) ×2 IMPLANT
COVER SURGICAL LIGHT HANDLE (MISCELLANEOUS) ×2 IMPLANT
CUFF TOURN SGL QUICK 34 (TOURNIQUET CUFF) ×1
CUFF TRNQT CYL 34X4X40X1 (TOURNIQUET CUFF) ×1 IMPLANT
DECANTER SPIKE VIAL GLASS SM (MISCELLANEOUS) ×2 IMPLANT
DRAPE C-ARM 42X120 X-RAY (DRAPES) ×2 IMPLANT
DRAPE C-ARMOR (DRAPES) ×2 IMPLANT
DRAPE U-SHAPE 47X51 STRL (DRAPES) ×2 IMPLANT
DRSG ADAPTIC 3X8 NADH LF (GAUZE/BANDAGES/DRESSINGS) IMPLANT
ELECT REM PT RETURN 9FT ADLT (ELECTROSURGICAL) ×2
ELECTRODE REM PT RTRN 9FT ADLT (ELECTROSURGICAL) ×1 IMPLANT
GAUZE XEROFORM 5X9 LF (GAUZE/BANDAGES/DRESSINGS) ×2 IMPLANT
GLOVE BIO SURGEON STRL SZ7.5 (GLOVE) ×2 IMPLANT
GLOVE BIOGEL PI IND STRL 7.0 (GLOVE) ×1 IMPLANT
GLOVE BIOGEL PI IND STRL 7.5 (GLOVE) ×1 IMPLANT
GLOVE BIOGEL PI IND STRL 8 (GLOVE) ×1 IMPLANT
GLOVE BIOGEL PI INDICATOR 7.0 (GLOVE) ×1
GLOVE BIOGEL PI INDICATOR 7.5 (GLOVE) ×1
GLOVE BIOGEL PI INDICATOR 8 (GLOVE) ×1
GLOVE ECLIPSE 8.0 STRL XLNG CF (GLOVE) ×2 IMPLANT
GOWN STRL REUS W/TWL XL LVL3 (GOWN DISPOSABLE) ×8 IMPLANT
K-WIRE ACE 1.6X6 (WIRE) ×2
KWIRE ACE 1.6X6 (WIRE) ×1 IMPLANT
NEEDLE HYPO 22GX1.5 SAFETY (NEEDLE) ×2 IMPLANT
PACK LOWER EXTREMITY WL (CUSTOM PROCEDURE TRAY) ×2 IMPLANT
PAD ABD 8X10 STRL (GAUZE/BANDAGES/DRESSINGS) ×2 IMPLANT
PAD CAST 4YDX4 CTTN HI CHSV (CAST SUPPLIES) ×2 IMPLANT
PADDING CAST COTTON 4X4 STRL (CAST SUPPLIES) ×2
PLATE FIBULA DISTAL 5 HOLE (Plate) ×2 IMPLANT
POSITIONER SURGICAL ARM (MISCELLANEOUS) ×2 IMPLANT
SCREW 3.5X40 (Screw) ×2 IMPLANT
SCREW 5.0X10 (Screw) ×2 IMPLANT
SCREW CANC 5.0X14 (Screw) ×2 IMPLANT
SCREW CANCELLOUS 5.0X16MM (Screw) ×2 IMPLANT
SCREW CANN 4.0X48 (Screw) ×4 IMPLANT
SCREW LOCK 3.5X8 (Screw) ×2 IMPLANT
SCREW NL 3.5X38MM (Screw) ×2 IMPLANT
SCREW NON LOCK 3.5X12 (Screw) ×6 IMPLANT
SCREW NON LOCK 3.5X20 (Screw) ×2 IMPLANT
SPONGE GAUZE 4X4 12PLY (GAUZE/BANDAGES/DRESSINGS) ×2 IMPLANT
SPONGE LAP 18X18 X RAY DECT (DISPOSABLE) ×6 IMPLANT
SUCTION FRAZIER 12FR DISP (SUCTIONS) ×2 IMPLANT
SUT ETHILON 2 0 PS N (SUTURE) ×8 IMPLANT
SUT ETHILON 4 0 PS 2 18 (SUTURE) ×4 IMPLANT
SUT VIC AB 0 CT1 36 (SUTURE) ×6 IMPLANT
SUT VIC AB 2-0 CT1 27 (SUTURE) ×5
SUT VIC AB 2-0 CT1 TAPERPNT 27 (SUTURE) ×5 IMPLANT
SYR CONTROL 10ML LL (SYRINGE) ×2 IMPLANT
TOWEL OR 17X26 10 PK STRL BLUE (TOWEL DISPOSABLE) ×4 IMPLANT

## 2013-07-10 NOTE — Discharge Instructions (Signed)
No weight on your left leg at all. Leaver your current dressing in place, clean, and dry until further notice. Ice and elevation for swelling. Wear your walking boot when up, but no weight. Take a 325 mg aspirin 2 times daily with meals

## 2013-07-10 NOTE — Transfer of Care (Signed)
Immediate Anesthesia Transfer of Care Note  Patient: Tammy Hogan  Procedure(s) Performed: Procedure(s) (LRB): OPEN REDUCTION INTERNAL FIXATION (ORIF) LEFT BIMALLEOLAR ANKLE FRACTURE (Left)  Patient Location: PACU  Anesthesia Type: General  Level of Consciousness: sedated, patient cooperative and responds to stimulation  Airway & Oxygen Therapy: Patient Spontanous Breathing and Patient connected to face mask oxgen  Post-op Assessment: Report given to PACU RN and Post -op Vital signs reviewed and stable  Post vital signs: Reviewed and stable  Complications: No apparent anesthesia complications

## 2013-07-10 NOTE — Anesthesia Preprocedure Evaluation (Addendum)
Anesthesia Evaluation  Patient identified by MRN, date of birth, ID band Patient awake    Reviewed: Allergy & Precautions, H&P , NPO status , Patient's Chart, lab work & pertinent test results  Airway Mallampati: II TM Distance: >3 FB Neck ROM: Full    Dental  (+) Dental Advisory Given   Pulmonary Current Smoker,  breath sounds clear to auscultation        Cardiovascular hypertension, Pt. on medications + angina Rhythm:Regular Rate:Normal  Echo 06/2012 - Left ventricle: The cavity size was normal. There was   moderate concentric hypertrophy. Systolic function was   moderately reduced. The estimated ejection fraction was in  the range of 35% to 40%. There is severe hypokinesis to  akinesis of theanterior and anteroseptal walls to the  apex, consistent with LAD territory ischemia / infarct.  Doppler parameters are consistent with pseudonormal left  ventricular relaxation (grade2 diastolic dysfunction). The   E/A ratio is >1.5. The E/e' ratio is >15, suggesting   markedly elevated LV filling pressure. - Mitral valve: Mild regurgitation. - Left atrium: The atrium was mildly dilated. - Inferior vena cava: The vessel was normal in size; the   respirophasic diameter changes were in the normal range (=  50%); findings are consistent with normal central venous  pressure.   Neuro/Psych PSYCHIATRIC DISORDERS Anxiety Depression negative neurological ROS     GI/Hepatic Neg liver ROS, GERD-  Medicated,  Endo/Other  Morbid obesity  Renal/GU negative Renal ROS     Musculoskeletal negative musculoskeletal ROS (+)   Abdominal   Peds  Hematology negative hematology ROS (+)   Anesthesia Other Findings   Reproductive/Obstetrics negative OB ROS                         Anesthesia Physical Anesthesia Plan  ASA: III  Anesthesia Plan: General and Regional   Post-op Pain Management: MAC Combined w/ Regional for  Post-op pain   Induction: Intravenous  Airway Management Planned: LMA  Additional Equipment:   Intra-op Plan:   Post-operative Plan: Extubation in OR  Informed Consent: I have reviewed the patients History and Physical, chart, labs and discussed the procedure including the risks, benefits and alternatives for the proposed anesthesia with the patient or authorized representative who has indicated his/her understanding and acceptance.   Dental advisory given  Plan Discussed with: CRNA  Anesthesia Plan Comments:        Anesthesia Quick Evaluation

## 2013-07-10 NOTE — Anesthesia Procedure Notes (Addendum)
Anesthesia Regional Block:  Popliteal block  Pre-Anesthetic Checklist: ,, timeout performed, Correct Patient, Correct Site, Correct Laterality, Correct Procedure, Correct Position, site marked, Risks and benefits discussed, Surgical consent,  Pre-op evaluation,  Post-op pain management  Laterality: Left  Prep: chloraprep       Needles:  Injection technique: Single-shot  Needle Type: Stimiplex      Needle Gauge: 20 and 20 G    Additional Needles:  Procedures: ultrasound guided (picture in chart) and nerve stimulator  Motor weakness within 5 minutes. Popliteal block  Nerve Stimulator or Paresthesia:  Response: Foot eversion , 0.6 mA,   Additional Responses:   Narrative:  Start time: 07/10/2013 3:35 PM End time: 07/10/2013 3:45 PM Injection made incrementally with aspirations every 5 mL.  Performed by: Personally  Anesthesiologist: Germeroth  Additional Notes: Tolerated well.    Anesthesia Regional Block:  Femoral nerve block  Pre-Anesthetic Checklist: ,, timeout performed, Correct Patient, Correct Site, Correct Laterality, Correct Procedure, Correct Position, site marked, Risks and benefits discussed, Surgical consent,  Pre-op evaluation,  Post-op pain management  Laterality: Left  Prep: chloraprep       Needles:  Injection technique: Single-shot  Needle Type: Stimiplex     Needle Length: 10cm 9 cm Needle Gauge: 20 and 20 G    Additional Needles:  Procedures: ultrasound guided (picture in chart) and nerve stimulator Femoral nerve block  Nerve Stimulator or Paresthesia:  Response: Patellar, 0.6 mA,   Additional Responses:   Narrative:  Start time: 07/10/2013 5:45 PM End time: 07/10/2013 5:55 PM Injection made incrementally with aspirations every 5 mL.  Performed by: Personally  Anesthesiologist: Germeroth   Additional Notes: Saphenous nerve block. Difficult anatomy due to pt habitus. Patellar snap. Tolerated well.   Procedure Name: LMA  Insertion Date/Time: 07/10/2013 6:38 PM Performed by: Ofilia Neas Pre-anesthesia Checklist: Patient identified, Timeout performed, Patient being monitored, Emergency Drugs available and Suction available Patient Re-evaluated:Patient Re-evaluated prior to inductionOxygen Delivery Method: Circle system utilized Preoxygenation: Pre-oxygenation with 100% oxygen Intubation Type: IV induction LMA: LMA with gastric port inserted LMA Size: 4.0 Number of attempts: 1 Placement Confirmation: positive ETCO2 Tube secured with: Tape Dental Injury: Teeth and Oropharynx as per pre-operative assessment

## 2013-07-10 NOTE — H&P (Signed)
Tammy Hogan is an 38 y.o. female.   Chief Complaint:   Left ankle pain with known unstable fracture HPI:   38 yo female who slipped and fell last week injuring her left ankle.  She was seen in the ER and found to have a left ankle fracture/dislocation.  This was reduced and splinted by the ER staff.  She now presents for definitive fixation of this unstable injury.  Past Medical History  Diagnosis Date  . Hypertension     No past surgical history on file.  No family history on file. Social History:  reports that she has been smoking.  She does not have any smokeless tobacco history on file. She reports that she drinks alcohol. She reports that she does not use illicit drugs.  Allergies:  Allergies  Allergen Reactions  . Percocet [Oxycodone-Acetaminophen] Nausea Only    No prescriptions prior to admission    No results found for this or any previous visit (from the past 48 hour(s)). No results found.  Review of Systems  All other systems reviewed and are negative.    Last menstrual period 07/02/2013. Physical Exam  Constitutional: She is oriented to person, place, and time. She appears well-developed and well-nourished.  HENT:  Head: Normocephalic and atraumatic.  Eyes: EOM are normal. Pupils are equal, round, and reactive to light.  Neck: Normal range of motion. Neck supple.  Cardiovascular: Normal rate.   Respiratory: Effort normal and breath sounds normal.  GI: Soft. Bowel sounds are normal.  Musculoskeletal:       Left ankle: She exhibits decreased range of motion, swelling, ecchymosis and deformity. Tenderness. Lateral malleolus and medial malleolus tenderness found.  Neurological: She is alert and oriented to person, place, and time.  Skin: Skin is warm and dry.  Psychiatric: She has a normal mood and affect.     Assessment/Plan Left unstable bimalleolar ankle fracture/dislocation 1)  To the OR today for open reduction/internal fixation of her left ankle  given the severe instability of the fracture  Greydis Stlouis Y 07/10/2013, 12:03 PM

## 2013-07-10 NOTE — Progress Notes (Signed)
Patient informed that surgery is delayed and RN will keep her informed as to what time she will go to surgery. Patient understands; however, states she is very hungry and thirsty. Emotional support given.

## 2013-07-10 NOTE — Anesthesia Postprocedure Evaluation (Signed)
  Anesthesia Post-op Note  Patient: Tammy Hogan  Procedure(s) Performed: Procedure(s): OPEN REDUCTION INTERNAL FIXATION (ORIF) LEFT BIMALLEOLAR ANKLE FRACTURE (Left)  Patient Location: PACU  Anesthesia Type:General  Level of Consciousness: awake, alert  and oriented  Airway and Oxygen Therapy: Patient Spontanous Breathing and Patient connected to nasal cannula oxygen  Post-op Pain: mild  Post-op Assessment: Post-op Vital signs reviewed, Patient's Cardiovascular Status Stable, Respiratory Function Stable, Patent Airway, No signs of Nausea or vomiting and Pain level controlled  Post-op Vital Signs: Reviewed and stable  Complications: No apparent anesthesia complications

## 2013-07-10 NOTE — Brief Op Note (Signed)
07/10/2013  8:59 PM  PATIENT:  Florence Canner  38 y.o. female  PRE-OPERATIVE DIAGNOSIS:  Left bimalleolar ankle fracture  POST-OPERATIVE DIAGNOSIS:  Left bimalleolar ankle fracture  PROCEDURE:  Procedure(s): OPEN REDUCTION INTERNAL FIXATION (ORIF) LEFT BIMALLEOLAR ANKLE FRACTURE (Left)  SURGEON:  Surgeon(s) and Role:    * Mcarthur Rossetti, MD - Primary  PHYSICIAN ASSISTANT:  Debroah Baller, PA-C  ANESTHESIA:   local, regional and general  EBL:  Total I/O In: 1000 [I.V.:1000] Out: 150 [Blood:150]  BLOOD ADMINISTERED:none  DRAINS: none   LOCAL MEDICATIONS USED:  MARCAINE     SPECIMEN:  No Specimen  DISPOSITION OF SPECIMEN:  N/A  COUNTS:  YES  TOURNIQUET:   Total Tourniquet Time Documented: Thigh (Left) - 3 minutes Total: Thigh (Left) - 3 minutes   DICTATION: .Other Dictation: Dictation Number [id number]  PLAN OF CARE: Admit for overnight observation  PATIENT DISPOSITION:  PACU - hemodynamically stable.   Delay start of Pharmacological VTE agent (>24hrs) due to surgical blood loss or risk of bleeding: no

## 2013-07-11 MED ORDER — INFLUENZA VAC SPLIT QUAD 0.5 ML IM SUSP
0.5000 mL | INTRAMUSCULAR | Status: AC | PRN
  Administered 2013-07-11: 0.5 mL via INTRAMUSCULAR
  Filled 2013-07-11: qty 0.5

## 2013-07-11 MED ORDER — PNEUMOCOCCAL VAC POLYVALENT 25 MCG/0.5ML IJ INJ
0.5000 mL | INJECTION | INTRAMUSCULAR | Status: AC | PRN
  Administered 2013-07-11: 0.5 mL via INTRAMUSCULAR
  Filled 2013-07-11: qty 0.5

## 2013-07-11 NOTE — Discharge Summary (Signed)
Physician Discharge Summary      Patient ID: Tammy Hogan MRN: [id number] DOB/AGE: 39-Jan-1977 39 y.o.  Admit date: 07/10/2013 Discharge date: 07/11/2013  Admission Diagnoses:  Bimalleolar fracture of left ankle  Discharge Diagnoses:  Principal Problem:   Bimalleolar fracture of left ankle   Past Medical History  Diagnosis Date  . Hypertension   . History of cardiac cath 07/23/2012    Dr Irish Lack  . Anginal pain   . Depression   . PPD positive, treated 2008    [place], [place]  . GERD (gastroesophageal reflux disease)   . Anxiety     [place], [place]    Surgeries: Procedure(s): OPEN REDUCTION INTERNAL FIXATION (ORIF) LEFT BIMALLEOLAR ANKLE FRACTURE on 07/10/2013   Consultants (if any):    Discharged Condition: Improved  Hospital Course: Tammy Hogan is an 38 y.o. female who was admitted 07/10/2013 with a diagnosis of Bimalleolar fracture of left ankle and went to the operating room on 07/10/2013 and underwent the above named procedures.    She was given perioperative antibiotics:      Anti-infectives   Start     Dose/Rate Route Frequency Ordered Stop   07/11/13 0600  ceFAZolin (ANCEF) IVPB 2 g/50 mL premix     2 g 100 mL/hr over 30 Minutes Intravenous On call to O.R. 07/10/13 1355 07/10/13 1815   07/11/13 0015  ceFAZolin (ANCEF) IVPB 1 g/50 mL premix     1 g 100 mL/hr over 30 Minutes Intravenous Every 6 hours 07/10/13 2236 07/11/13 1345    .  She was given sequential compression devices, early ambulation, and aspirin for DVT prophylaxis.  She benefited maximally from the hospital stay and there were no complications.    Recent vital signs:  Filed Vitals:   07/11/13 1327  BP: 183/94  Pulse: 75  Temp: 98.3 F (36.8 C)  Resp: 18    Recent laboratory studies:  Lab Results  Component Value Date   HGB 12.5 07/10/2013   HGB 12.2 07/02/2012   HGB 13.6 06/30/2012   Lab Results  Component Value Date   WBC 7.0 07/10/2013   PLT 459* 07/10/2013   No results  found for this basename: INR   Lab Results  Component Value Date   NA 140 07/10/2013   K 4.4 07/10/2013   CL 102 07/10/2013   CO2 26 07/10/2013   BUN 8 07/10/2013   CREATININE 0.64 07/10/2013   GLUCOSE 83 07/10/2013    Discharge Medications:     Medication List    STOP taking these medications       HYDROcodone-acetaminophen 5-325 MG per tablet  Commonly known as:  NORCO/VICODIN      TAKE these medications       aspirin EC 325 MG tablet  Take 1 tablet (325 mg total) by mouth 2 (two) times daily after a meal.     diphenhydramine-acetaminophen 25-500 MG Tabs  Commonly known as:  TYLENOL PM  Take 1 tablet by mouth at bedtime as needed (for sleep).     furosemide 20 MG tablet  Commonly known as:  LASIX  Take 20 mg by mouth daily.     lisinopril 20 MG tablet  Commonly known as:  PRINIVIL,ZESTRIL  Take 20 mg by mouth daily.     metoprolol succinate 100 MG 24 hr tablet  Commonly known as:  TOPROL-XL  Take 100 mg by mouth daily. Take with or immediately following a meal.     nitroGLYCERIN 0.4 MG SL tablet  Commonly known as:  NITROSTAT  Place 0.4 mg under the tongue every 5 (five) minutes as needed for chest pain.     oxyCODONE-acetaminophen 5-325 MG per tablet  Commonly known as:  ROXICET  Take 1-2 tablets by mouth every 4 (four) hours as needed for severe pain.     promethazine 12.5 MG suppository  Commonly known as:  PHENERGAN  Place 1 suppository (12.5 mg total) rectally every 6 (six) hours as needed for nausea or vomiting.        Diagnostic Studies: X-ray Chest Pa Or Ap  07/10/2013   CLINICAL DATA:  Preop left ankle ORIF.  EXAM: CHEST - 1 VIEW  COMPARISON:  CT chest 07/01/2012.  FINDINGS: Cardiac enlargement is stable. The lungs are clear. The visualized soft tissues and bony thorax are unremarkable.  IMPRESSION: 1. Stable cardiomegaly without failure.   Electronically Signed   By: Lawrence Santiago M.D.   On: 07/10/2013 15:23   Dg Tibia/fibula Left  07/02/2013    CLINICAL DATA:  Status post fall with left lower leg deformity  EXAM: LEFT TIBIA AND FIBULA - 2 VIEW  COMPARISON:  None.  FINDINGS: There are displaced fractures of the distal tibia and fibula. There are no fractures of the proximal and mid tibia and fibula.  IMPRESSION: Fractures of distal tibia and fibula.   Electronically Signed   By: Abelardo Diesel M.D.   On: 07/02/2013 21:35   Dg Ankle 2 Views Left  07/02/2013   CLINICAL DATA:  Postreduction.  EXAM: LEFT ANKLE - 2 VIEW  COMPARISON:  Earlier in the day  FINDINGS: 2249 hr. Overlying cast. Reduction of distal tibia and fibular fractures. Improved alignment in the coronal plane. Persistent displacement of both fractures in the sagittal plane on the lateral view. Improvement in ankle mortise widening. Mild residual anterior widening identified on the lateral.  IMPRESSION: Interval reduction of ankle fractures with improved alignment.   Electronically Signed   By: Abigail Miyamoto M.D.   On: 07/02/2013 23:48   Dg Ankle Complete Left  07/10/2013   CLINICAL DATA:  Fractured left ankle.  EXAM: LEFT ANKLE COMPLETE - 3+ VIEW  COMPARISON:  07/02/2013  FINDINGS: 1 min 35 seconds fluoroscopy utilized.  Three intraoperative fluoroscopic films were submitted. There is been open reduction internal fixation of the transverse medial malleolus and coronal oblique distal fibular fractures. Hardware includes fibular plate and screw, syndesmotic screws, and medial malleolar lag screws. No immediate complication.  IMPRESSION: ORIF of distal tibial and fibular fractures.No adverse features.   Electronically Signed   By: Jorje Guild M.D.   On: 07/10/2013 21:05   Dg Ankle Complete Left  07/02/2013   CLINICAL DATA:  Status post fall with lower left leg deformity  EXAM: LEFT ANKLE COMPLETE - 3+ VIEW  COMPARISON:  None.  FINDINGS: There is displaced fracture of the distal fibula. There is displaced fracture of the medial malleolus. There are small bony fragments between the distal  tibia and fibula, question origin. There is ankle mortise disruption. There is mild dislocation of the distal tibia relative to the talus.  IMPRESSION: Fractures of distal tibia and fibula as described. There is ankle mortise disruption with mild dislocation of distal tibia relative to the talus.   Electronically Signed   By: Abelardo Diesel M.D.   On: 07/02/2013 21:29   Dg Foot Complete Left  07/02/2013   CLINICAL DATA:  Status post fall with lower left leg deformity  EXAM: LEFT FOOT - COMPLETE 3+ VIEW  COMPARISON:  None.  FINDINGS: There are fractures of the distal tibia and fibula. No other acute fracture or dislocation is identified within the left foot.  IMPRESSION: There are fractures of the distal tibia and fibula. No other acute fracture or dislocation is identified within the left foot.   Electronically Signed   By: Abelardo Diesel M.D.   On: 07/02/2013 21:32   Dg C-arm 61-120 Min-no Report  07/10/2013   CLINICAL DATA: fractured left ankle   C-ARM 61-120 MINUTES  Fluoroscopy was utilized by the requesting physician.  No radiographic  interpretation.     Disposition: 01-Home or Self Care  Discharge Orders   Future Appointments Provider Department Dept Phone   08/06/2013 3:00 PM Midge Minium, MD [facility]   Future Orders Complete By Expires   Call MD / Call 911  As directed    Comments:     If you experience chest pain or shortness of breath, CALL 911 and be transported to the hospital emergency room.  If you develope a fever above 101.5 F, pus (white drainage) or increased drainage or redness at the wound, or calf pain, call your surgeon's office.   Constipation Prevention  As directed    Comments:     Drink plenty of fluids.  Prune juice may be helpful.  You may use a stool softener, such as Colace (over the counter) 100 mg twice a day.  Use MiraLax (over the counter) for constipation as needed.   Diet - low sodium heart healthy  As directed     Driving restrictions  As directed    Comments:     No driving while taking narcotic pain meds.   Increase activity slowly as tolerated  As directed       Follow-up Information   Follow up with Mcarthur Rossetti, MD In 2 weeks.   Specialty:  Orthopedic Surgery   Contact information:   [address] [place] [address] [phone]        Signed: Marianna Payment 07/11/2013, 9:11 PM

## 2013-07-11 NOTE — Progress Notes (Signed)
   Subjective:  Patient reports pain as mild.  No events  Objective:   VITALS:   Filed Vitals:   07/10/13 2224 07/11/13 0025 07/11/13 0125 07/11/13 0630  BP: 162/83 160/98 152/92 158/80  Pulse: 99 107 104 75  Temp: 98 F (36.7 C) 97.7 F (36.5 C) 98.1 F (36.7 C) 98.2 F (36.8 C)  TempSrc:  Oral Oral Oral  Resp: 20 18 18 20   Height:      Weight:      SpO2: 100% 99% 99% 97%    Neurologically intact Neurovascular intact Sensation intact distally Intact pulses distally Incision: dressing C/D/I and no drainage No cellulitis present Compartment soft   Lab Results  Component Value Date   WBC 7.0 07/10/2013   HGB 12.5 07/10/2013   HCT 38.8 07/10/2013   MCV 90.2 07/10/2013   PLT 459* 07/10/2013     Assessment/Plan: 1 Day Post-Op   Problem List Items Addressed This Visit   None      Advance diet Up with PT/OT DVT ppx - SCDs, ambulation, asa NWB left and lower extremity Pain control Discharge home today after PT   Marianna Payment 07/11/2013, 9:01 AM [phone]

## 2013-07-11 NOTE — Evaluation (Signed)
Physical Therapy Evaluation Patient Details Name: Tammy Hogan MRN: [id number] DOB: [birth year] Today's Date: 07/11/2013 Time: 1224-8250 PT Time Calculation (min): 27 min  PT Assessment / Plan / Recommendation History of Present Illness  s/p ORIF L ankle  Clinical Impression  Pt will doing well, has been on crutches at home x~1wk with much difficulty; will recommend RW and knee scooter for use at home as pt does report falls with crutches    PT Assessment  Patent does not need any further PT services    Follow Up Recommendations       Does the patient have the potential to tolerate intense rehabilitation      Barriers to Discharge        Equipment Recommendations       Recommendations for Other Services     Frequency      Precautions / Restrictions Precautions Precautions: Fall Restrictions Other Position/Activity Restrictions: NWB   Pertinent Vitals/Pain No c/o pain although does report incr discomfort with dependent positioning     Mobility  Bed Mobility Overal bed mobility: Modified Independent Transfers Overall transfer level: Needs assistance Equipment used: Rolling walker (2 wheeled) Transfers: Sit to/from Stand Sit to Stand: Min guard;Supervision;Modified independent (Device/Increase time) General transfer comment: multiple transfers from varying ht surfaces; pt with LOB x 1 when trying to sit before reaching chair; reviewed precautions and safety with transfers; pt verbalizes Ambulation/Gait Ambulation/Gait assistance: Supervision;Min guard Assistive device: Rolling walker (2 wheeled) (knee scooter) General Gait Details: cues for safe technique and NWB Stairs:  (demo'd up 1 step with RW; pt declined practice)    Exercises     PT Diagnosis:    PT Problem List:   PT Treatment Interventions:       PT Goals(Current goals can be found in the care plan section) Acute Rehab PT Goals PT Goal Formulation: No goals set, d/c therapy  Visit  Information  Last PT Received On: 07/11/13 Assistance Needed: +1 History of Present Illness: s/p ORIF L ankle       Prior Functioning  Home Living Family/patient expects to be discharged to:: Private residence Living Arrangements: Non-relatives/Friends;Spouse/significant other Available Help at Discharge: Family Type of Home: Apartment Home Layout: One level Home Equipment: Crutches Additional Comments: pt using rolling office chair at home  and hopping on one leg  Prior Function Level of Independence: Independent Communication Communication: No difficulties    Cognition  Cognition Arousal/Alertness: Awake/alert Behavior During Therapy: WFL for tasks assessed/performed Overall Cognitive Status: Within Functional Limits for tasks assessed    Extremity/Trunk Assessment Upper Extremity Assessment Upper Extremity Assessment: Overall WFL for tasks assessed Lower Extremity Assessment Lower Extremity Assessment: LLE deficits/detail LLE Deficits / Details: knee and hip AROM WFL LLE: Unable to fully assess due to immobilization   Balance    End of Session PT - End of Session Activity Tolerance: Patient tolerated treatment well Patient left: in chair;with call bell/phone within reach Nurse Communication: Mobility status  GP     [facility] 07/11/2013, 11:24 AM

## 2013-07-11 NOTE — Op Note (Signed)
[name], Hogan NO.:  [id number]  MEDICAL RECORD NO.:  [id number]  LOCATION:  52                         FACILITY:  [facility]  PHYSICIAN:  Lind Guest. Ninfa Linden, M.D.DATE OF BIRTH:  July 24, 1975  DATE OF PROCEDURE:  07/10/2013 DATE OF DISCHARGE:  07/11/2013                              OPERATIVE REPORT   PREOPERATIVE DIAGNOSIS:  Left unstable bimalleolar ankle fracture.  POSTOPERATIVE DIAGNOSIS:  Left unstable bimalleolar ankle fracture with syndesmosis injury.  PROCEDURES: 1. Open reduction and internal fixation of left bimalleolar ankle     fracture. 2. Stabilization of the left ankle syndesmosis using 2 syndesmosis     screws.  SURGEON:  Lind Guest. Ninfa Linden, M.D.  ASSISTANT:  Erskine Emery, P.A.  ANESTHESIA: 1. General. 2. Regional with popliteal block. 3. Local 0.25% plain Marcaine.  BLOOD LOSS:  150 mL.  TOURNIQUET TIME:  3 minutes.  ANTIBIOTICS:  2 g IV Ancef.  COMPLICATIONS:  None.  INDICATIONS:  Tammy Hogan is a 38 year old female who a week ago sustained a mechanical fall in her backyard and injuring her left ankle.  She had a fracture-dislocation of this left ankle, which was reduced and splinted appropriately by the ER staff.  We then sterile back in the office this week.  The swelling has gone down.  We recommended she undergo open reduction and internal fixation of this unstable left ankle fracture. She agreed to this.  She is someone though with a history of congestive heart failure and poor medical compliance and high blood pressure as well and she is a smoker.  PROCEDURE DESCRIPTION:  After informed consent was obtained, appropriate left ankle was marked.  Anesthesia tensor regional block.  She was then brought to the operating room, placed supine on the operating table. General anesthesia was then obtained.  A bump was placed on her left hip in the nonsterile tourniquet.  Her left leg was then prepped and draped from the  knee down the toes with DuraPrep and sterile drapes.  Time-out was called.  She was identified as correct patient, correct left ankle. We then used Esmarch wrap out the leg and tourniquet was inflated to 300 mm of pressure.  We made an incision directly over the fibula and she had an abundant brisk venous bleeding from her tourniquet, so the tourniquet was let down after 3 minutes.  Hemostasis was then controlled with suddenly tourniquet down as the anesthesia worked on controlling her blood pressure, which had been significantly high.  First, we were able to identify the fibula fracture and using reduction of forceps and tenaculum, was reduced the fracture.  We placed a single lag screw from anterior to posterior in the fibula, and then a [employer]hew periarticular fibular plate along the lateral cortex of the fibula.  We secured this with bicortical screws proximally and cancellous screws and a locking screw distally.  We then turned attention to the medial side. We made incision directly over the medial malleolus and was able to reduce the medial malleolus piece and placed 2 temporary guide pins and then drilled the near cortex for partially-threaded screws in a parallel format to reduce the medial side.  I then stressed the ankle syndesmosis under  direct fluoroscopy and it was felt to be a slightly unstable, so through the fibular plate back on the lateral side, I was able to place 2 syndesmosis screws capturing 3 cortices and reducing the syndesmosis. We then irrigated the soft tissue on both sides of the ankle and closed the deep tissue with 0 Vicryl over the hardware, 2-0 Vicryl in subcutaneous tissue, interrupted 2-0 nylon on the skin incisions.  We then infiltrated the incisions with 0.25% plain Sensorcaine.  We then placed well-padded sterile dressings, and she was awakened, extubated, and taken to recovery room in stable condition.  All final counts were correct and no  complications noted.  Postoperatively, she will be admitted for pain control, as well as monitoring her blood pressure and then getting up with physical therapy with nonweightbearing on her left ankle.  Of note, Erskine Emery, P.A. was present in the entire case, his presence was crucial for given this case facilitated and completed.     Lind Guest. Ninfa Linden, M.D.     CYB/MEDQ  D:  07/10/2013  T:  07/11/2013  Job:  [id number]

## 2013-07-13 NOTE — Progress Notes (Signed)
Late entry for 07/11/2013    CARE MANAGEMENT NOTE 07/13/2013  Patient:  Tammy Hogan, Tammy Hogan   Account Number:  [id number]  Date Initiated:  07/13/2013  Documentation initiated by:  [facility]  Subjective/Objective Assessment:   Bimalleolar fracture of left ankle     Action/Plan:   No HH PT needed   Anticipated DC Date:  07/13/2013   Anticipated DC Plan:  [place]  CM consult      Choice offered to / List presented to:     DME arranged  Vassie Moselle      DME agency  [facility].        Status of service:  Completed, signed off Medicare Important Message given?   (If response is "NO", the following Medicare IM given date fields will be blank) Date Medicare IM given:   Date Additional Medicare IM given:    Discharge Disposition:  HOME/SELF CARE  Per UR Regulation:    If discussed at Long Length of Stay Meetings, dates discussed:    Comments:  07/11/2013 1400 [facility] notified for DME for home. Jonnie Finner RN CCM Case Mgmt phone [phone]

## 2013-07-14 ENCOUNTER — Encounter (HOSPITAL_COMMUNITY): Payer: Self-pay | Admitting: Orthopaedic Surgery

## 2013-08-05 ENCOUNTER — Telehealth: Payer: Self-pay

## 2013-08-05 NOTE — Telephone Encounter (Signed)
Left message for call back Non-identifiable  New patient 

## 2013-08-06 ENCOUNTER — Ambulatory Visit: Payer: BC Managed Care – PPO | Admitting: Family Medicine

## 2013-08-10 NOTE — Telephone Encounter (Signed)
Appt was cancelled due to weather.   

## 2013-09-10 ENCOUNTER — Ambulatory Visit: Payer: BC Managed Care – PPO | Admitting: Family Medicine

## 2013-09-16 ENCOUNTER — Ambulatory Visit: Payer: Self-pay | Admitting: Family Medicine

## 2013-12-01 ENCOUNTER — Encounter: Payer: Self-pay | Admitting: Cardiology

## 2013-12-07 ENCOUNTER — Ambulatory Visit: Payer: BC Managed Care – PPO | Admitting: Interventional Cardiology

## 2014-02-16 ENCOUNTER — Ambulatory Visit (INDEPENDENT_AMBULATORY_CARE_PROVIDER_SITE_OTHER): Payer: BC Managed Care – PPO | Admitting: Interventional Cardiology

## 2014-02-16 ENCOUNTER — Encounter: Payer: Self-pay | Admitting: Interventional Cardiology

## 2014-02-16 VITALS — BP 190/105 | HR 68 | Ht 63.0 in | Wt 242.0 lb

## 2014-02-16 DIAGNOSIS — F17200 Nicotine dependence, unspecified, uncomplicated: Secondary | ICD-10-CM

## 2014-02-16 DIAGNOSIS — I10 Essential (primary) hypertension: Secondary | ICD-10-CM

## 2014-02-16 DIAGNOSIS — I5022 Chronic systolic (congestive) heart failure: Secondary | ICD-10-CM

## 2014-02-16 DIAGNOSIS — I429 Cardiomyopathy, unspecified: Secondary | ICD-10-CM

## 2014-02-16 DIAGNOSIS — I428 Other cardiomyopathies: Secondary | ICD-10-CM

## 2014-02-16 DIAGNOSIS — R0602 Shortness of breath: Secondary | ICD-10-CM

## 2014-02-16 MED ORDER — AMLODIPINE BESYLATE 10 MG PO TABS: 10.0000 mg | ORAL_TABLET | Freq: Every day | ORAL | Status: DC

## 2014-02-16 MED ORDER — FUROSEMIDE 20 MG PO TABS: 20.0000 mg | ORAL_TABLET | Freq: Every day | ORAL | Status: DC

## 2014-02-16 NOTE — Progress Notes (Signed)
Patient ID: Tammy Hogan, female   DOB: 08-Jun-1976, 38 y.o.   MRN: [id number]    [address], [place]  [address] Phone: [phone] Fax:  [phone]  Date:  02/16/2014   ID:  Tammy Hogan, DOB 12/08/1975, MRN [id number]  PCP:  Osborne Casco, MD      History of Present Illness: Tammy Hogan is a 38 y.o. female with obesity, PCOS and a nonischemic cardiomyopathy-diagnosed in 2014.  She has had difficulty controlled her BP.  She broke her ankle in early 2015 requiring.  She has occasional SHOB with lying flat.  It is not daily.  It comes and goes.  SOme nights are ok.  It resolves on its own.  She has not had to sleep more upright to relieve the [facility].    BP is still quite high per her report.  Home readings around 546 systolic.   SHe continues to smoke.     Wt Readings from Last 3 Encounters:  02/16/14 242 lb (109.77 kg)  07/10/13 243 lb (110.224 kg)  07/10/13 243 lb (110.224 kg)     Past Medical History  Diagnosis Date  . Hypertension   . History of cardiac cath 07/23/2012    Dr Irish Lack  . Anginal pain   . Depression   . PPD positive, treated 2008    [place], [place]  . GERD (gastroesophageal reflux disease)   . Anxiety     [place], [place]  . PCOS (polycystic ovarian syndrome)   . HPV (human papilloma virus) infection   . History of echocardiogram     Cardiomyopathy-reduced EF 35-40% with wall m otion abnormality concerning for ischemia. Hypokinesis of the anterior and anteroseptal walls to the apex    Current Outpatient Prescriptions  Medication Sig Dispense Refill  . aspirin 81 MG tablet Take 81 mg by mouth daily.      . diphenhydramine-acetaminophen (TYLENOL PM) 25-500 MG TABS Take 1 tablet by mouth at bedtime as needed (for sleep).      . furosemide (LASIX) 20 MG tablet Take 20 mg by mouth daily.      [name] lisinopril (PRINIVIL,ZESTRIL) 20 MG tablet Take 20 mg by mouth daily.      . metoprolol succinate (TOPROL-XL) 50 MG 24 hr tablet  Take 50 mg by mouth 3 (three) times daily. Take with or immediately following a meal.      . nitroGLYCERIN (NITROSTAT) 0.4 MG SL tablet Place 0.4 mg under the tongue every 5 (five) minutes as needed for chest pain.       No current facility-administered medications for this visit.    Allergies:    Allergies  Allergen Reactions  . Percocet [Oxycodone-Acetaminophen] Nausea Only    Social History:  The patient  reports that she has been smoking.  She does not have any smokeless tobacco history on file. She reports that she drinks alcohol. She reports that she does not use illicit drugs.   Family History:  The patient's family history includes Diabetes in her paternal grandmother; Glaucoma in her paternal grandmother; Heart failure in her paternal grandmother; Hyperlipidemia in her mother; Hypertension in her mother and paternal grandmother; Other in her father.   ROS:  Please see the history of present illness.  No nausea, vomiting.  No fevers, chills.  No focal weakness.  No dysuria.    All other systems reviewed and negative.   PHYSICAL EXAM: VS:  BP 190/105  Pulse 68  Ht 5\' 3"  (1.6  m)  Wt 242 lb (109.77 kg)  BMI 42.88 kg/m2  SpO2 98% Well nourished, well developed, in no acute distress HEENT: normal Neck: no JVD, no carotid bruits Cardiac:  normal S1, S2; RRR;  Lungs:  clear to auscultation bilaterally, no wheezing, rhonchi or rales Abd: soft, nontender, no hepatomegaly Ext: no edema, left ankle scars Skin: warm and dry Neuro:   no focal abnormalities noted     ASSESSMENT AND PLAN:  1. Nonischemic cardiomyopathy:  May be related BP. She had no CAD by cath in 2014 2. HTN: Lowest BP at home is in the 326Z systolic.  Her BP has been poorly cpontrlled.  Add amlodipine 10 mg daily.  F/u with [place] has not been good.  SHe will also see Dr. Laurann Montana.   3. SHOB: Check BNP.  May need to increase Lasix although she does not appear grossly fluid overloaded.  4. We discussed possible  pregnancy for her at length. I think she would be high risk for peripartum cardiomyopathy. She is also concerned that due to her polycystic ovarian syndrome, she would not get pregnant. She is looking into a surrogate. I did inform her that lisinopril would have to be stopped if she wanted to get pregnant. This would potentially worsen her cardiac function. 5. Smoking: She needs to stop completely.  SHe has cut back.  Mild palpitations as well.  No syncope. Continue to monitor. Beta blocker was recently increased.  Signed, Mina Marble, MD, [facility] 02/16/2014 3:41 PM

## 2014-02-16 NOTE — Patient Instructions (Signed)
Your physician has recommended you make the following change in your medication:  1. Start Amlodipine 10 mg 1 tablet daily.   Your physician recommends that you return for lab work today for BNP and Bmet.   Your physician recommends that you schedule a follow-up appointment in: 3 months with Dr. Irish Lack.

## 2014-02-17 LAB — BASIC METABOLIC PANEL
BUN: 8 mg/dL (ref 6–23)
CHLORIDE: 103 meq/L (ref 96–112)
CO2: 30 meq/L (ref 19–32)
CREATININE: 0.8 mg/dL (ref 0.4–1.2)
Calcium: 8.8 mg/dL (ref 8.4–10.5)
GFR: 101.16 mL/min (ref >60.00)
GLUCOSE: 85 mg/dL (ref 70–99)
Potassium: 4 mEq/L (ref 3.5–5.1)
Sodium: 137 mEq/L (ref 135–145)

## 2014-02-17 LAB — BRAIN NATRIURETIC PEPTIDE: Pro B Natriuretic peptide (BNP): 83 pg/mL (ref 0.0–100.0)

## 2014-05-01 ENCOUNTER — Ambulatory Visit (INDEPENDENT_AMBULATORY_CARE_PROVIDER_SITE_OTHER): Payer: BC Managed Care – PPO | Admitting: Family Medicine

## 2014-05-01 VITALS — BP 158/96 | HR 74 | Temp 98.1°F | Resp 20 | Ht 63.25 in | Wt 232.4 lb

## 2014-05-01 DIAGNOSIS — L02415 Cutaneous abscess of right lower limb: Secondary | ICD-10-CM

## 2014-05-01 DIAGNOSIS — R0981 Nasal congestion: Secondary | ICD-10-CM

## 2014-05-01 DIAGNOSIS — R059 Cough, unspecified: Secondary | ICD-10-CM

## 2014-05-01 DIAGNOSIS — R05 Cough: Secondary | ICD-10-CM

## 2014-05-01 MED ORDER — DOXYCYCLINE HYCLATE 100 MG PO TABS: 100.0000 mg | ORAL_TABLET | Freq: Two times a day (BID) | ORAL | Status: DC

## 2014-05-01 NOTE — Progress Notes (Signed)
Procedure: Consent obtained.  Rubbing alcohol applied to abscess at right inner thigh.  Ethyl chloride then 2% Lidocaine applied around abscess.  Abscess cleaned with povidine.  Abscess incised with 11 blade for 1.5cm.  Abscess reached with drainage present.  Packing strip inserted into wound for healing by secondary intentions.  Dressings applied.

## 2014-05-01 NOTE — Progress Notes (Addendum)
Subjective:    Patient ID: Tammy Hogan, female    DOB: 1976-01-31, 38 y.o.   MRN: [id number] This chart was scribed for Dr. Robyn Haber by Steva Colder, ED Scribe. The patient was seen in room 12 at 12:20 PM.   Chief Complaint  Patient presents with   Recurrent Skin Infections    Abcess right inner thigh   Nasal Congestion   Cough    HPI Tammy Hogan is a 38 y.o. female who presents today complaining of recurrent skin infections onset 4 days. Pt states that the abscess has been there before. She states that she is having associated symptoms of nasal congestion and cough.  She denies any other associated symptoms. She states that she works at [employer] at [employer].   Patient Active Problem List   Diagnosis Date Noted   Bimalleolar fracture of left ankle 07/10/2013   Cardiomyopathy 42/59/5638   Systolic heart failure 75/64/3329   HTN (hypertension), malignant 07/01/2012   Pulmonary edema 07/01/2012   Back pain, acute 07/01/2012   Medically noncompliant 07/01/2012   Tobacco use disorder 07/01/2012   Past Medical History  Diagnosis Date   Hypertension    History of cardiac cath 07/23/2012    Dr Irish Lack   Anginal pain    Depression    PPD positive, treated 2008    [place], [place]   GERD (gastroesophageal reflux disease)    Anxiety     [place], [place]   PCOS (polycystic ovarian syndrome)    HPV (human papilloma virus) infection    History of echocardiogram     Cardiomyopathy-reduced EF 35-40% with wall m otion abnormality concerning for ischemia. Hypokinesis of the anterior and anteroseptal walls to the apex   Past Surgical History  Procedure Laterality Date   Cardiac catheterization  feb, 2014    Dr Irish Lack   Orif ankle fracture Left 07/10/2013    Procedure: OPEN REDUCTION INTERNAL FIXATION (ORIF) LEFT BIMALLEOLAR ANKLE FRACTURE;  Surgeon: Mcarthur Rossetti, MD;  Location: [facility];  Service: Orthopedics;  Laterality: Left;    Allergies  Allergen Reactions   Percocet [Oxycodone-Acetaminophen] Nausea Only   Prior to Admission medications   Medication Sig Start Date End Date Taking? Authorizing Provider  amLODipine (NORVASC) 10 MG tablet Take 1 tablet (10 mg total) by mouth daily. 02/16/14  Yes Jettie Booze, MD  aspirin 81 MG tablet Take 81 mg by mouth daily.   Yes Historical Provider, MD  Biotin 5000 MCG CAPS Take 1 capsule by mouth daily.   Yes Historical Provider, MD  diphenhydramine-acetaminophen (TYLENOL PM) 25-500 MG TABS Take 1 tablet by mouth at bedtime as needed (for sleep).   Yes Historical Provider, MD  furosemide (LASIX) 20 MG tablet Take 1 tablet (20 mg total) by mouth daily. 02/16/14  Yes Jettie Booze, MD  levonorgestrel (MIRENA) 20 MCG/24HR IUD 1 each by Intrauterine route once.   Yes Historical Provider, MD  lisinopril (PRINIVIL,ZESTRIL) 20 MG tablet Take 20 mg by mouth daily.   Yes Historical Provider, MD  metoprolol succinate (TOPROL-XL) 50 MG 24 hr tablet Take 50 mg by mouth 3 (three) times daily. Take with or immediately following a meal.   Yes Historical Provider, MD  nitroGLYCERIN (NITROSTAT) 0.4 MG SL tablet Place 0.4 mg under the tongue every 5 (five) minutes as needed for chest pain.   Yes Historical Provider, MD     Review of Systems  HENT: Positive for congestion.   Respiratory: Positive for cough.  Skin:       Abscess on the right inside thigh  All other systems reviewed and are negative.      Objective:   Physical Exam  Constitutional: She is oriented to person, place, and time. She appears well-developed and well-nourished. No distress.  HENT:  Head: Normocephalic and atraumatic.  Eyes: EOM are normal.  Neck: Neck supple. No tracheal deviation present.  Cardiovascular: Normal rate.   Pulmonary/Chest: Effort normal. No respiratory distress.  Musculoskeletal: Normal range of motion.  Neurological: She is alert and oriented to person, place, and time.  Skin:  Skin is warm and dry. There is erythema.  15 cm of erythema on right inside thigh. 3 cm of raised indurated fluctuant abscess in the proximal medial thigh.  Psychiatric: She has a normal mood and affect. Her behavior is normal.  Nursing note and vitals reviewed.  See PA note    BP 158/96 mmHg   Pulse 74   Temp(Src) 98.1 F (36.7 C) (Oral)   Resp 20   Ht 5' 3.25" (1.607 m)   Wt 232 lb 6 oz (105.405 kg)   BMI 40.82 kg/m2   SpO2 100%  Assessment & Plan:  DIAGNOSTIC STUDIES: Oxygen Saturation is 100% on room air, normal by my interpretation.    COORDINATION OF CARE: 12:23 PM-Discussed treatment plan which includes abx and I&D with pt at bedside and pt agreed to plan.    I personally performed the services described in this documentation, which was scribed in my presence. The recorded information has been reviewed and is accurate.  Abscess of right thigh - Plan: Wound culture, doxycycline (VIBRA-TABS) 100 MG tablet  Cough - Plan: doxycycline (VIBRA-TABS) 100 MG tablet  Nasal congestion - Plan: doxycycline (VIBRA-TABS) 100 MG tablet   Signed, Robyn Haber, MD

## 2014-05-03 ENCOUNTER — Ambulatory Visit (INDEPENDENT_AMBULATORY_CARE_PROVIDER_SITE_OTHER): Payer: BC Managed Care – PPO | Admitting: Family Medicine

## 2014-05-03 VITALS — BP 126/88 | HR 74 | Temp 98.1°F | Resp 18

## 2014-05-03 DIAGNOSIS — L0291 Cutaneous abscess, unspecified: Secondary | ICD-10-CM

## 2014-05-03 DIAGNOSIS — L0390 Cellulitis, unspecified: Secondary | ICD-10-CM

## 2014-05-03 NOTE — Progress Notes (Signed)
Symptomatic: Patient having no new problems.  Objective: Packing removed revealing clean base, no induration, minimal tenderness  Assessment: Rapid improvement  Plan: Finish antibiotics, wash daily, return as necessary  Signed, Carola Frost.D.

## 2014-05-04 LAB — WOUND CULTURE: Gram Stain: NONE SEEN

## 2014-05-17 ENCOUNTER — Ambulatory Visit: Payer: BC Managed Care – PPO | Admitting: Interventional Cardiology

## 2014-05-19 ENCOUNTER — Other Ambulatory Visit: Payer: Self-pay

## 2014-05-19 MED ORDER — LISINOPRIL 20 MG PO TABS: 20.0000 mg | ORAL_TABLET | Freq: Every day | ORAL | Status: DC

## 2014-06-15 ENCOUNTER — Ambulatory Visit: Payer: BC Managed Care – PPO | Admitting: Interventional Cardiology

## 2014-07-16 ENCOUNTER — Ambulatory Visit (INDEPENDENT_AMBULATORY_CARE_PROVIDER_SITE_OTHER): Payer: BC Managed Care – PPO | Admitting: Interventional Cardiology

## 2014-07-16 ENCOUNTER — Encounter: Payer: Self-pay | Admitting: Interventional Cardiology

## 2014-07-16 VITALS — BP 172/92 | HR 73 | Ht 63.0 in | Wt 245.1 lb

## 2014-07-16 DIAGNOSIS — I429 Cardiomyopathy, unspecified: Secondary | ICD-10-CM

## 2014-07-16 DIAGNOSIS — R0600 Dyspnea, unspecified: Secondary | ICD-10-CM

## 2014-07-16 DIAGNOSIS — F17200 Nicotine dependence, unspecified, uncomplicated: Secondary | ICD-10-CM

## 2014-07-16 DIAGNOSIS — Z720 Tobacco use: Secondary | ICD-10-CM

## 2014-07-16 DIAGNOSIS — I10 Essential (primary) hypertension: Secondary | ICD-10-CM

## 2014-07-16 DIAGNOSIS — I5022 Chronic systolic (congestive) heart failure: Secondary | ICD-10-CM

## 2014-07-16 MED ORDER — FUROSEMIDE 20 MG PO TABS: 20.0000 mg | ORAL_TABLET | Freq: Every day | ORAL | Status: DC

## 2014-07-16 MED ORDER — LISINOPRIL 20 MG PO TABS: 20.0000 mg | ORAL_TABLET | Freq: Every day | ORAL | Status: DC

## 2014-07-16 MED ORDER — NICOTINE 21 MG/24HR TD PT24: 21.0000 mg | MEDICATED_PATCH | Freq: Every day | TRANSDERMAL | Status: DC

## 2014-07-16 MED ORDER — AMLODIPINE BESYLATE 10 MG PO TABS: 10.0000 mg | ORAL_TABLET | Freq: Every day | ORAL | Status: DC

## 2014-07-16 MED ORDER — METOPROLOL TARTRATE 50 MG PO TABS: 50.0000 mg | ORAL_TABLET | Freq: Three times a day (TID) | ORAL | Status: DC

## 2014-07-16 NOTE — Patient Instructions (Signed)
Your physician has requested that you have an echocardiogram. Echocardiography is a painless test that uses sound waves to create images of your heart. It provides your doctor with information about the size and shape of your heart and how well your heart's chambers and valves are working. This procedure takes approximately one hour. There are no restrictions for this procedure.  Your physician has recommended you make the following change in your medication:  1) Start a nicotine patch 21 mg/ 24 hours- apply one per day  Your physician recommends that you schedule a follow-up appointment in: 6 weeks with Dr. Irish Lack.

## 2014-07-16 NOTE — Progress Notes (Signed)
Patient ID: Tammy Hogan, female   DOB: 1976-01-09, 39 y.o.   MRN: [id number] . Patient ID: Tammy Hogan, female   DOB: 24-May-1976, 39 y.o.   MRN: [id number]    Tammy Hogan, Tammy Hogan, [name]  [address] Phone: [phone] Fax:  [phone]  Date:  07/16/2014   ID:  Tammy Hogan, DOB 11-30-1975, MRN [id number]  PCP:  Osborne Casco, MD      History of Present Illness: Tammy Hogan is a 38 y.o. female with obesity, PCOS and a nonischemic cardiomyopathy-diagnosed in 2014.  She has had difficulty controlled her BP.  She broke her ankle in early 2015 requiring.  She has occasional SHOB with lying flat.  It is not daily.  It comes and goes.  SOme nights are ok.  It resolves on its own .  She has not had to sleep more upright to relieve the [facility].    BP is better per her report.  Home readings around 725 systolic.   SHe continues to smoke.     Wt Readings from Last 3 Encounters:  07/16/14 245 lb 2 oz (111.188 kg)  05/01/14 232 lb 6 oz (105.405 kg)  02/16/14 242 lb (109.77 kg)     Past Medical History  Diagnosis Date  . Hypertension   . History of cardiac cath 07/23/2012    Dr Irish Lack  . Anginal pain   . Depression   . PPD positive, treated 2008    [place], [place]  . GERD (gastroesophageal reflux disease)   . Anxiety     [place], [place]  . PCOS (polycystic ovarian syndrome)   . HPV (human papilloma virus) infection   . History of echocardiogram     Cardiomyopathy-reduced EF 35-40% with wall m otion abnormality concerning for ischemia. Hypokinesis of the anterior and anteroseptal walls to the apex    Current Outpatient Prescriptions  Medication Sig Dispense Refill  . amLODipine (NORVASC) 10 MG tablet Take 1 tablet (10 mg total) by mouth daily. 30 tablet 6  . aspirin 81 MG tablet Take 81 mg by mouth daily.    . Biotin 5000 MCG CAPS Take 1 capsule by mouth daily.    . diphenhydramine-acetaminophen (TYLENOL PM) 25-500 MG TABS Take 1 tablet by mouth at  bedtime as needed (for sleep).    . furosemide (LASIX) 20 MG tablet Take 1 tablet (20 mg total) by mouth daily. 30 tablet 6  . levonorgestrel (MIRENA) 20 MCG/24HR IUD 1 each by Intrauterine route once.    [name] lisinopril (PRINIVIL,ZESTRIL) 20 MG tablet Take 1 tablet (20 mg total) by mouth daily. 30 tablet 2  . nitroGLYCERIN (NITROSTAT) 0.4 MG SL tablet Place 0.4 mg under the tongue every 5 (five) minutes as needed for chest pain.    . metoprolol (LOPRESSOR) 50 MG tablet Take 50 mg by mouth 3 (three) times daily.     No current facility-administered medications for this visit.    Allergies:    Allergies  Allergen Reactions  . Percocet [Oxycodone-Acetaminophen] Nausea Only    Social History:  The patient  reports that she has been smoking.  She has never used smokeless tobacco. She reports that she drinks alcohol. She reports that she does not use illicit drugs.   Family History:  The patient's family history includes Diabetes in her paternal grandmother; Glaucoma in her paternal grandmother; Heart failure in her paternal grandmother; Hyperlipidemia in her mother; Hypertension in her mother and paternal grandmother; Other in her father.  ROS:  Please see the history of present illness.  No nausea, vomiting.  No fevers, chills.  No focal weakness.  No dysuria.    All other systems reviewed and negative.   PHYSICAL EXAM: VS:  Ht 5\' 3"  (1.6 m)  Wt 245 lb 2 oz (111.188 kg)  BMI 43.43 kg/m2 Well nourished, well developed, in no acute distress HEENT: normal Neck: no JVD, no carotid bruits Cardiac:  normal S1, S2; RRR;  Lungs:  clear to auscultation bilaterally, no wheezing, rhonchi or rales Abd: soft, nontender, no hepatomegaly Ext: no edema, left ankle scars; open sore in right arm pit where abscess opened Skin: warm and dry Neuro:   no focal abnormalities noted Psych: normal affect  ECG: normal    ASSESSMENT AND PLAN:  1. Nonischemic cardiomyopathy:  May be related BP. She had no  CAD by cath in 2014. Mild SHOB as well at night.  Better with deep breathing.  WIll check echo. Las t EF 35% in 2014. 2. HTN: After Adding amlodipine 10 mg daily, BP is typically in the 728-206 systolic.  Today, BP was 172/92.  She has not taken her amlodipine today.  She has been taking advil of late.  F/u with [place] has not been good in the past , but better of late.  She is being treated for an abscess under her right arm.  She will call in with some BP readings.  Could increase lisinopril if needed.   3. SHOB: Check BNP.  May need to increase Lasix although she does not appear grossly fluid overloaded.  4. We discussed possible pregnancy for her at length. I think she would be high risk for peripartum cardiomyopathy. She is also concerned that due to her polycystic ovarian syndrome, she would not get pregnant. Her OB/GYN did not approve her getting pregnant. She is now comfortable with this.  Wil not adjust her BP meds. Smoking: She needs to stop completely.  SHe has cut back.  Syncope with Chantix.  She is willing to try the patches.    Signed, Mina Marble, MD, [facility] 07/16/2014 5:10 PM

## 2014-07-22 ENCOUNTER — Other Ambulatory Visit: Payer: Self-pay

## 2014-07-22 ENCOUNTER — Ambulatory Visit (HOSPITAL_COMMUNITY): Payer: BC Managed Care – PPO | Attending: Cardiology | Admitting: Radiology

## 2014-07-22 DIAGNOSIS — R0600 Dyspnea, unspecified: Secondary | ICD-10-CM | POA: Diagnosis not present

## 2014-07-22 MED ORDER — LISINOPRIL 20 MG PO TABS: 20.0000 mg | ORAL_TABLET | Freq: Every day | ORAL | Status: DC

## 2014-07-22 MED ORDER — AMLODIPINE BESYLATE 10 MG PO TABS: 10.0000 mg | ORAL_TABLET | Freq: Every day | ORAL | Status: DC

## 2014-07-22 NOTE — Progress Notes (Signed)
Echocardiogram performed.  

## 2014-09-07 ENCOUNTER — Ambulatory Visit (INDEPENDENT_AMBULATORY_CARE_PROVIDER_SITE_OTHER): Payer: BC Managed Care – PPO | Admitting: Interventional Cardiology

## 2014-09-07 ENCOUNTER — Encounter: Payer: Self-pay | Admitting: Interventional Cardiology

## 2014-09-07 VITALS — BP 132/76 | HR 74 | Ht 63.0 in | Wt 245.8 lb

## 2014-09-07 DIAGNOSIS — I429 Cardiomyopathy, unspecified: Secondary | ICD-10-CM | POA: Diagnosis not present

## 2014-09-07 DIAGNOSIS — I10 Essential (primary) hypertension: Secondary | ICD-10-CM | POA: Diagnosis not present

## 2014-09-07 DIAGNOSIS — F17210 Nicotine dependence, cigarettes, uncomplicated: Secondary | ICD-10-CM | POA: Diagnosis not present

## 2014-09-07 DIAGNOSIS — Z720 Tobacco use: Secondary | ICD-10-CM | POA: Diagnosis not present

## 2014-09-07 DIAGNOSIS — F17200 Nicotine dependence, unspecified, uncomplicated: Secondary | ICD-10-CM

## 2014-09-07 NOTE — Patient Instructions (Signed)
Your physician recommends that you continue on your current medications as directed. Please refer to the Current Medication list given to you today.  Your physician has requested that you regularly monitor and record your blood pressure readings at home. Please call the office at [phone] and ask to speak to a triage nurse to report your readings in about 2 weeks.  Your physician wants you to follow-up in: 6 months. You will receive a reminder letter in the mail two months in advance. If you don't receive a letter, please call our office to schedule the follow-up appointment.

## 2014-09-07 NOTE — Progress Notes (Signed)
Patient ID: Tammy Hogan, female   DOB: 1975-07-01, 39 y.o.   MRN: [id number]    [address], [address] [place], [place]  [address] Phone: [phone] Fax:  [phone]  Date:  09/07/2014   ID:  Tammy Hogan, DOB 1975/09/06, MRN [id number]  PCP:  Osborne Casco, MD      History of Present Illness: Tammy Hogan is a 39 y.o. female with obesity, PCOS and a nonischemic cardiomyopathy-diagnosed in 2014.  She has had difficulty controlled her BP.  She broke her ankle in early 2015 requiring.  She has occasional SHOB with lying flat.  It is not daily.  It comes and goes.  SOme nights are ok.  It resolves on its own .  She has not had to sleep more upright to relieve the [facility].    BP is still high per her report.  Home readings around 387 systolic.   SHe continues to smoke.  She checks BP 4x/week. She is no longer living with her Mom.  Her diet has improved.    Wt Readings from Last 3 Encounters:  09/07/14 245 lb 12.8 oz (111.494 kg)  07/16/14 245 lb 2 oz (111.188 kg)  05/01/14 232 lb 6 oz (105.405 kg)     Past Medical History  Diagnosis Date  . Hypertension   . History of cardiac cath 07/23/2012    Dr Irish Lack  . Anginal pain   . Depression   . PPD positive, treated 2008    [place], [place]  . GERD (gastroesophageal reflux disease)   . Anxiety     [place], [place]  . PCOS (polycystic ovarian syndrome)   . HPV (human papilloma virus) infection   . History of echocardiogram     Cardiomyopathy-reduced EF 35-40% with wall m otion abnormality concerning for ischemia. Hypokinesis of the anterior and anteroseptal walls to the apex    Current Outpatient Prescriptions  Medication Sig Dispense Refill  . amLODipine (NORVASC) 10 MG tablet Take 1 tablet (10 mg total) by mouth daily. 90 tablet 3  . aspirin 81 MG tablet Take 81 mg by mouth daily.    . Biotin 5000 MCG CAPS Take 1 capsule by mouth daily.    . diphenhydramine-acetaminophen (TYLENOL PM) 25-500 MG TABS Take 1 tablet  by mouth at bedtime as needed (for sleep).    . furosemide (LASIX) 20 MG tablet Take 1 tablet (20 mg total) by mouth daily. 90 tablet 3  . levonorgestrel (MIRENA) 20 MCG/24HR IUD 1 each by Intrauterine route once.    [name] lisinopril (PRINIVIL,ZESTRIL) 20 MG tablet Take 1 tablet (20 mg total) by mouth daily. 90 tablet 3  . metoprolol (LOPRESSOR) 50 MG tablet Take 1 tablet (50 mg total) by mouth 3 (three) times daily. 270 tablet 3  . nicotine (NICODERM CQ - DOSED IN MG/24 HOURS) 21 mg/24hr patch Place 1 patch (21 mg total) onto the skin daily. 28 patch 0  . nitroGLYCERIN (NITROSTAT) 0.4 MG SL tablet Place 0.4 mg under the tongue every 5 (five) minutes as needed for chest pain.     No current facility-administered medications for this visit.    Allergies:    Allergies  Allergen Reactions  . Percocet [Oxycodone-Acetaminophen] Nausea Only    Social History:  The patient  reports that she has been smoking.  She has never used smokeless tobacco. She reports that she drinks alcohol. She reports that she does not use illicit drugs.   Family History:  The patient's family history  includes Diabetes in her paternal grandmother; Glaucoma in her paternal grandmother; Heart failure in her paternal grandmother; Hyperlipidemia in her mother; Hypertension in her mother and paternal grandmother; Other in her father.   ROS:  Please see the history of present illness.  No nausea, vomiting.  No fevers, chills.  No focal weakness.  No dysuria.  Arm abscess resolved.    All other systems reviewed and negative.   PHYSICAL EXAM: VS:  BP 132/76 mmHg  Pulse 74  Ht 5\' 3"  (1.6 m)  Wt 245 lb 12.8 oz (111.494 kg)  BMI 43.55 kg/m2 Well nourished, well developed, in no acute distress HEENT: normal Neck: no JVD, no carotid bruits Cardiac:  normal S1, S2; RRR;  Lungs:  clear to auscultation bilaterally, no wheezing, rhonchi or rales Abd: soft, nontender, no hepatomegaly Ext: no edema, left ankle scars; open sore in  right arm pit where abscess opened Skin: warm and dry Neuro:   no focal abnormalities noted Psych: normal affect  ECG: normal    ASSESSMENT AND PLAN:  1. Nonischemic cardiomyopathy:  May be related BP. She had no CAD by cath in 2014. EF 35% in 2014.  Echo in 2/16 was back to normal. 2. HTN: BP has been higher at home, 536U systolic.  SHe has been stressed due to being out of a job.  BP here today is better.  Her diet is better. She is taking her meds regularly.  F/u with [place] has not been good in the past , but better of late.   She will call in with some BP readings.  Could increase lisinopril if needed.   Her insurance will be running out soon, but she has 4 months of meds. 3. SHOB: resolved.  4. In the past, we discussed possible pregnancy for her at length. I think she would be high risk for peripartum cardiomyopathy. She is also concerned that due to her polycystic ovarian syndrome, she would not get pregnant. Her OB/GYN did not approve her getting pregnant. She is now comfortable with this.  Wil not adjust her BP meds. Smoking: She needs to stop completely.  SHe has cut back.  Syncope with Ch[facility]ix.  She is getting the patches through insurance. She is mentally ready to quit.    Signed, Mina Marble, MD, [facility] 09/07/2014 3:54 PM

## 2015-02-22 ENCOUNTER — Other Ambulatory Visit: Payer: Self-pay | Admitting: Interventional Cardiology

## 2015-02-23 NOTE — Telephone Encounter (Signed)
Patient stated that she no longer has health coverage and does not know when she will be able to schedule an appointment. I mentioned that she could see if her primary care physician would refill her meds and she tells me that she doesn't have one. How long can her refills be extended? Please advise. Thanks, MI

## 2015-03-01 ENCOUNTER — Other Ambulatory Visit: Payer: Self-pay | Admitting: Interventional Cardiology

## 2015-03-01 MED ORDER — LISINOPRIL 20 MG PO TABS: 20.0000 mg | ORAL_TABLET | Freq: Every day | ORAL | Status: DC

## 2015-03-01 MED ORDER — FUROSEMIDE 20 MG PO TABS: 20.0000 mg | ORAL_TABLET | Freq: Every day | ORAL | Status: DC

## 2015-03-01 MED ORDER — AMLODIPINE BESYLATE 10 MG PO TABS: 10.0000 mg | ORAL_TABLET | Freq: Every day | ORAL | Status: DC

## 2015-03-10 NOTE — Telephone Encounter (Signed)
OK to refill for a year.

## 2015-12-08 ENCOUNTER — Other Ambulatory Visit: Payer: Self-pay | Admitting: Interventional Cardiology

## 2016-03-06 ENCOUNTER — Ambulatory Visit: Payer: BC Managed Care – PPO | Admitting: Interventional Cardiology

## 2016-03-12 ENCOUNTER — Telehealth: Payer: Self-pay

## 2016-03-12 ENCOUNTER — Telehealth: Payer: Self-pay | Admitting: Interventional Cardiology

## 2016-03-12 MED ORDER — METOPROLOL TARTRATE 50 MG PO TABS: 50.0000 mg | ORAL_TABLET | Freq: Three times a day (TID) | ORAL | 1 refills | Status: DC

## 2016-03-12 NOTE — Telephone Encounter (Signed)
**Note De-Identified Tammy Hogan Obfuscation** The pt is requesting a refill on her Metoprolol until her OV with Dr Irish Lack on 11/7. She is advised that I will send that RX in for her to [facility] on [address] for 90 tablets with 1 refill. Rx has been sent to [facility] to fill.

## 2016-03-12 NOTE — Telephone Encounter (Signed)
Walk In Patient Form- patient dropped off labs in envelope. Also asking for refills on medication.

## 2016-03-28 ENCOUNTER — Other Ambulatory Visit: Payer: Self-pay | Admitting: Interventional Cardiology

## 2016-04-01 ENCOUNTER — Emergency Department (HOSPITAL_COMMUNITY)
Admission: EM | Admit: 2016-04-01 | Discharge: 2016-04-01 | Disposition: A | Discharge status: Home / Self Care | Payer: BC Managed Care – PPO | Attending: Emergency Medicine | Admitting: Emergency Medicine

## 2016-04-01 ENCOUNTER — Encounter (HOSPITAL_COMMUNITY): Payer: Self-pay | Admitting: Emergency Medicine

## 2016-04-01 DIAGNOSIS — X58XXXA Exposure to other specified factors, initial encounter: Secondary | ICD-10-CM | POA: Insufficient documentation

## 2016-04-01 DIAGNOSIS — Y929 Unspecified place or not applicable: Secondary | ICD-10-CM | POA: Insufficient documentation

## 2016-04-01 DIAGNOSIS — F17200 Nicotine dependence, unspecified, uncomplicated: Secondary | ICD-10-CM | POA: Insufficient documentation

## 2016-04-01 DIAGNOSIS — Z79899 Other long term (current) drug therapy: Secondary | ICD-10-CM | POA: Insufficient documentation

## 2016-04-01 DIAGNOSIS — S46811A Strain of other muscles, fascia and tendons at shoulder and upper arm level, right arm, initial encounter: Secondary | ICD-10-CM | POA: Insufficient documentation

## 2016-04-01 DIAGNOSIS — Y939 Activity, unspecified: Secondary | ICD-10-CM | POA: Insufficient documentation

## 2016-04-01 DIAGNOSIS — I110 Hypertensive heart disease with heart failure: Secondary | ICD-10-CM | POA: Insufficient documentation

## 2016-04-01 DIAGNOSIS — Y999 Unspecified external cause status: Secondary | ICD-10-CM | POA: Insufficient documentation

## 2016-04-01 DIAGNOSIS — I5020 Unspecified systolic (congestive) heart failure: Secondary | ICD-10-CM | POA: Insufficient documentation

## 2016-04-01 DIAGNOSIS — Z7982 Long term (current) use of aspirin: Secondary | ICD-10-CM | POA: Insufficient documentation

## 2016-04-01 MED ORDER — CYCLOBENZAPRINE HCL 10 MG PO TABS: 10.0000 mg | ORAL_TABLET | Freq: Every day | ORAL | 0 refills | Status: DC

## 2016-04-01 MED ORDER — FUROSEMIDE 20 MG PO TABS: 20.0000 mg | ORAL_TABLET | Freq: Every day | ORAL | 1 refills | Status: DC

## 2016-04-01 MED ORDER — PREDNISONE 50 MG PO TABS: 50.0000 mg | ORAL_TABLET | Freq: Every day | ORAL | 0 refills | Status: DC

## 2016-04-01 MED ORDER — HYDROCODONE-ACETAMINOPHEN 5-325 MG PO TABS: 1.0000 | ORAL_TABLET | Freq: Four times a day (QID) | ORAL | 0 refills | Status: DC | PRN

## 2016-04-01 MED ORDER — AMLODIPINE BESYLATE 10 MG PO TABS: 10.0000 mg | ORAL_TABLET | Freq: Every day | ORAL | 1 refills | Status: DC

## 2016-04-01 MED ORDER — LISINOPRIL 20 MG PO TABS: 20.0000 mg | ORAL_TABLET | Freq: Every day | ORAL | 1 refills | Status: DC

## 2016-04-01 NOTE — ED Triage Notes (Signed)
Pt. Stated, I got the flu shot on my rt. Arm and since than I've had rt upper arm pain, nothing I take makes it go away.

## 2016-04-01 NOTE — Discharge Instructions (Signed)
Use ice and heat on your neck area. Return here as needed. Follow up with your cardiologist. Follow up with the specialist provided if your neck issues are not improving

## 2016-04-01 NOTE — ED Provider Notes (Signed)
[facility] DEPT Provider Note   CSN: [id number] Arrival date & time: 04/01/16  [id number]  By signing my name below, I, Delton Prairie, attest that this documentation has been prepared under the direction and in the presence of  [employer], PA-C. Electronically Signed: Delton Prairie, ED Scribe. 04/01/16. 9:22 AM.   History   Chief Complaint Chief Complaint  Patient presents with  . Neck Pain   The history is provided by the patient. No language interpreter was used.   HPI Comments:  Tammy Hogan is a 40 y.o. female who presents to the Emergency Department complaining of moderate right sided neck pain x 3 days. Pt states her pain radiates to her upper R arm and upper R back. She has taken ibuprofen, tylenol, applied icy hot, ice and heat with no relief. Pt denies weakness or numbness in her BUE and BLE. She denies any other complaints at this time.   Past Medical History:  Diagnosis Date  . Anginal pain ([place])   . [place], [place]  . Depression   . GERD (gastroesophageal reflux disease)   . History of cardiac cath 07/23/2012   Dr Irish Lack  . History of echocardiogram    Cardiomyopathy-reduced EF 35-40% with wall m otion abnormality concerning for ischemia. Hypokinesis of the anterior and anteroseptal walls to the apex  . HPV (human papilloma virus) infection   . Hypertension   . PCOS (polycystic ovarian syndrome)   . PPD positive, treated 2008   [place], [place]    Patient Active Problem List   Diagnosis Date Noted  . Essential hypertension 09/07/2014  . Bimalleolar fracture of left ankle 07/10/2013  . Cardiomyopathy ([place]) 07/02/2012  . Systolic heart failure ([place]) 07/02/2012  . HTN (hypertension), malignant 07/01/2012  . Pulmonary edema 07/01/2012  . Back pain, acute 07/01/2012  . Medically noncompliant 07/01/2012  . Tobacco use disorder 07/01/2012    Past Surgical History:  Procedure Laterality Date  . CARDIAC CATHETERIZATION  feb, 2014   Dr Irish Lack  . ORIF  ANKLE FRACTURE Left 07/10/2013   Procedure: OPEN REDUCTION INTERNAL FIXATION (ORIF) LEFT BIMALLEOLAR ANKLE FRACTURE;  Surgeon: Mcarthur Rossetti, MD;  Location: [facility] ORS;  Service: Orthopedics;  Laterality: Left;    OB History    No data available       Home Medications    Prior to Admission medications   Medication Sig Start Date End Date Taking? Authorizing Provider  amLODipine (NORVASC) 10 MG tablet Take 1 tablet (10 mg total) by mouth daily. MUST ATTEND APPOINTMENT 03/29/16   Jettie Booze, MD  aspirin 81 MG tablet Take 81 mg by mouth daily.    Historical Provider, MD  Biotin 5000 MCG CAPS Take 1 capsule by mouth daily.    Historical Provider, MD  diphenhydramine-acetaminophen (TYLENOL PM) 25-500 MG TABS Take 1 tablet by mouth at bedtime as needed (for sleep).    Historical Provider, MD  furosemide (LASIX) 20 MG tablet Take 1 tablet (20 mg total) by mouth daily. MUST ATTEND APPOINTMENT 03/29/16   Jettie Booze, MD  levonorgestrel (MIRENA) 20 MCG/24HR IUD 1 each by Intrauterine route once.    Historical Provider, MD  lisinopril (PRINIVIL,ZESTRIL) 20 MG tablet Take 1 tablet (20 mg total) by mouth daily. MUST ATTEND APPOINTMENT 03/29/16   Jettie Booze, MD  metoprolol (LOPRESSOR) 50 MG tablet Take 1 tablet (50 mg total) by mouth 3 (three) times daily. Pt is overdue for an appointment. Please call and schedule for  further refills 03/12/16   Jettie Booze, MD  nicotine (NICODERM CQ - DOSED IN MG/24 HOURS) 21 mg/24hr patch Place 1 patch (21 mg total) onto the skin daily. 07/16/14   Jettie Booze, MD  nitroGLYCERIN (NITROSTAT) 0.4 MG SL tablet Place 0.4 mg under the tongue every 5 (five) minutes as needed for chest pain.    Historical Provider, MD    Family History Family History  Problem Relation Age of Onset  . Hypertension Mother   . Hyperlipidemia Mother   . Heart failure Paternal Grandmother   . Hypertension Paternal Grandmother   . Diabetes Paternal  Grandmother   . Glaucoma Paternal Grandmother   . Other Father     drug overdose    Social History Social History  Substance Use Topics  . Smoking status: Current Every Day Smoker    Packs/day: 0.50    Years: 16.00  . Smokeless tobacco: Never Used  . Alcohol use 0.0 oz/week     Allergies   Percocet [oxycodone-acetaminophen]   Review of Systems Review of Systems  Musculoskeletal: Positive for myalgias.  Neurological: Negative for weakness and numbness.     Physical Exam Updated Vital Signs BP (!) 177/102 (BP Location: Left Arm)   Pulse 91   Temp 97.7 F (36.5 C) (Oral)   Resp 14   Ht 5\' 3"  (1.6 m)   Wt 262 lb (118.8 kg)   SpO2 100%   BMI 46.41 kg/m   Physical Exam  Constitutional: She is oriented to person, place, and time. She appears well-developed and well-nourished.  HENT:  Head: Normocephalic and atraumatic.  Pulmonary/Chest: Effort normal.  Musculoskeletal: Normal range of motion. She exhibits tenderness.       Cervical back: She exhibits tenderness and pain. She exhibits normal range of motion, no bony tenderness, no deformity and no spasm.       Back:  Pain along trapezius muscle on the R. No radicular symptoms. Good strength and ROM in BUE.  Neurological: She is alert and oriented to person, place, and time.  Skin: Skin is warm and dry.  Psychiatric: She has a normal mood and affect.  Nursing note and vitals reviewed.    ED Treatments / Results  DIAGNOSTIC STUDIES:  Oxygen Saturation is 100% on RA, normal by my interpretation.    COORDINATION OF CARE:  9:11 AM Discussed treatment plan with pt at bedside and pt agreed to plan.  Labs (all labs ordered are listed, but only abnormal results are displayed) Labs Reviewed - No data to display  EKG  EKG Interpretation None       Radiology No results found.  Procedures Procedures (including critical care time)  Medications Ordered in ED Medications - No data to display   Initial  Impression / Assessment and Plan / ED Course  I have reviewed the triage vital signs and the nursing notes.  Pertinent labs & imaging results that were available during my care of the patient were reviewed by me and considered in my medical decision making (see chart for details).  Clinical Course    Patient has a cervical strain, mostly of the trapezius muscle.  Her tenderness follows the musculature of the trapezius.  The patient is advised to use ice and heat on her neck.  Told to return here as needed.  Advised to follow-up with her audiologist for refills of her medications.  Patient has no neurological deficits on examination  Final Clinical Impressions(s) / ED Diagnoses   Final diagnoses:  None    New Prescriptions New Prescriptions   No medications on file  I personally performed the services described in this documentation, which was scribed in my presence. The recorded information has been reviewed and is accurate.    Dalia Heading, PA-C 04/01/16 [id number]    Fatima Blank, MD 04/02/16 [phone]

## 2016-04-01 NOTE — ED Notes (Signed)
Declined W/C at D/C and was escorted to lobby by RN. 

## 2016-04-17 ENCOUNTER — Ambulatory Visit: Payer: BC Managed Care – PPO | Admitting: Interventional Cardiology

## 2016-05-15 ENCOUNTER — Encounter: Payer: Self-pay | Admitting: Family Medicine

## 2016-05-15 ENCOUNTER — Ambulatory Visit: Payer: BC Managed Care – PPO | Admitting: Family Medicine

## 2016-05-15 ENCOUNTER — Ambulatory Visit (INDEPENDENT_AMBULATORY_CARE_PROVIDER_SITE_OTHER): Payer: 59 | Admitting: Family Medicine

## 2016-05-15 VITALS — BP 130/80 | HR 74 | Resp 12 | Ht 63.0 in | Wt 261.1 lb

## 2016-05-15 DIAGNOSIS — M25511 Pain in right shoulder: Secondary | ICD-10-CM | POA: Diagnosis not present

## 2016-05-15 DIAGNOSIS — M2550 Pain in unspecified joint: Secondary | ICD-10-CM | POA: Insufficient documentation

## 2016-05-15 DIAGNOSIS — F411 Generalized anxiety disorder: Secondary | ICD-10-CM | POA: Diagnosis not present

## 2016-05-15 DIAGNOSIS — F17200 Nicotine dependence, unspecified, uncomplicated: Secondary | ICD-10-CM | POA: Diagnosis not present

## 2016-05-15 DIAGNOSIS — Z6841 Body Mass Index (BMI) 40.0 and over, adult: Secondary | ICD-10-CM

## 2016-05-15 DIAGNOSIS — I10 Essential (primary) hypertension: Secondary | ICD-10-CM

## 2016-05-15 DIAGNOSIS — M542 Cervicalgia: Secondary | ICD-10-CM

## 2016-05-15 MED ORDER — BACLOFEN 10 MG PO TABS: 5.0000 mg | ORAL_TABLET | Freq: Three times a day (TID) | ORAL | 0 refills | Status: DC

## 2016-05-15 MED ORDER — LISINOPRIL 20 MG PO TABS: 20.0000 mg | ORAL_TABLET | Freq: Every day | ORAL | 0 refills | Status: DC

## 2016-05-15 MED ORDER — BUPROPION HCL ER (SR) 150 MG PO TB12: 150.0000 mg | ORAL_TABLET | Freq: Two times a day (BID) | ORAL | 1 refills | Status: DC

## 2016-05-15 MED ORDER — FUROSEMIDE 20 MG PO TABS: 20.0000 mg | ORAL_TABLET | Freq: Every day | ORAL | 0 refills | Status: DC

## 2016-05-15 NOTE — Patient Instructions (Addendum)
A few things to remember from today's visit:   Tobacco use disorder - Plan: buPROPion (WELLBUTRIN SR) 150 MG 12 hr tablet  Generalized anxiety disorder - Plan: buPROPion (WELLBUTRIN SR) 150 MG 12 hr tablet  Essential hypertension  Arthralgia of multiple joints  Cervicalgia - Plan: Ambulatory referral to Orthopedic Surgery, baclofen (LIORESAL) 10 MG tablet  Right shoulder pain, unspecified chronicity - Plan: Ambulatory referral to Orthopedic Surgery    What are some tips for weight loss? People become overweight for many reasons. Weight issues can run in families. They can be caused by unhealthy behaviors and a person's environment. Certain health problems and medicines can also lead to weight gain. There are some simple things you can do to reach and maintain a healthy weight:  Eat small more frequent healthy meals instead 3 bid meals. Also Weight Watchers is a good option. Avoid sweet drinks. These include regular soft drinks, fruit juices, fruit drinks, energy drinks, sweetened iced tea, and flavored milk. Avoid fast foods. Fast foods such as french fries, hamburgers, chicken nuggets, and pizza are high in calories and can cause weight gain. Eat a healthy breakfast. People who skip breakfast tend to weigh more. Don't watch more than two hours of television per day. Chew sugar-free gum between meals to cut down on snacking. Avoid grocery shopping when you're hungry. Pack a healthy lunch instead of eating out to control what and how much you eat. Eat a lot of fruits and vegetables. Aim for about 2 cups of fruit and 2 to 3 cups of vegetables per day. Aim for 150 minutes per week of moderate-intensity exercise (such as brisk walking), or 75 minutes per week of vigorous exercise (such as jogging or running). OR 15-30 min of daily brisk walking. Be more active. Small changes in physical activity can easily be added to your daily routine. For example, take the stairs instead of the  elevator. Take a walk with your family. A daily walk is a great way to get exercise and to catch up on the day's events. Wellbutrin 1 daily x 1 week then 2 times per day.  Eye examination recommended.  Gyn 05/17/16  Please be sure medication list is accurate. If a new problem present, please set up appointment sooner than planned today.

## 2016-05-15 NOTE — Progress Notes (Signed)
HPI:   [name] is a 40 y.o. female, who is here today to establish care with me.  Former PCP: Dr Tammy Hogan Last preventive routine visit: She is not sure, she has appt with gyn 05/17/16.  Concerns today: "pressure behing my eyes", which she has had for 1-2 months. She states that her grandmother has similar problem and she uses Rx eye drops. Feels like she has to blink frequently.   Left eye pression sensation, no exacerbating or alliviating factors. No Hx of allergies and no URI symptoms.  Problem is stable. She denies headache or visual changes, conjunctival erythema, drainage, or pruritus. No Hx of trauma.  Last eye exam 3 years ago.  Hypertension:   Dx at age 56 and medication at 89.  Currently on Amlodipine 10 mg daily, Lisinopril 20 mg, and Metoprolol Tartrate 50 mg bid (she is supposed to take it tid)   No side effects reported.  She has not noted exertional chest pain, dyspnea,  focal weakness, or edema.   Lab Results  Component Value Date   CREATININE 0.8 02/16/2014   BUN 8 02/16/2014   NA 137 02/16/2014   K 4.0 02/16/2014   CL 103 02/16/2014   CO2 30 02/16/2014    Hx of CHF, has appt with cardiologists 05/2016.   She does not follow a healthy diet and walks a few times per weeks. + Smoker.  -She is also c/o 2 year of arthralgias: knee L>R, left ankle (s/p Fx), and bilateral hips. Left ankle edema intermittently. Hip pain worse when sitting for long time, alleviated with movement. Right shoulder pain, which she attributes to pulling heavy ice bags when she was working at [employer] years ago. She has been in the ER , 04/01/16, due to , right shoulder pain radiated to neck and elbow, occasional tingling sensation in fingers. Improved after started on oral steroids but started again about 4 days ago.    Pain described as pulling sensation.  No limitation of shoulder ROM. 7/10, intermittently. OTC medications do not  work.  Anxiety: She states that she "always" has had it. She took medications in the past: Lexapro and Celexa. Also reporting being on Xanax in the past.  Shaking hands like having a panic attack, mood swings. She denies suicidal thoughts. States that she feels like she can not"hold my self together"  Insomnia: Sleeps 2-3 hours, cannot "shot up" her mind at night. She is not aware of sleep apnea. She denies Hx of bipolar disorder.    Review of Systems  Constitutional: Positive for fatigue. Negative for activity change, appetite change, fever and unexpected weight change.  HENT: Negative for mouth sores, nosebleeds and trouble swallowing.   Eyes: Negative for photophobia, redness and visual disturbance.  Respiratory: Negative for cough, shortness of breath and wheezing.   Cardiovascular: Negative for chest pain, palpitations and leg swelling.  Gastrointestinal: Negative for abdominal pain, nausea and vomiting.       Negative for changes in bowel habits.  Genitourinary: Negative for decreased urine volume, difficulty urinating, dysuria and hematuria.  Musculoskeletal: Positive for arthralgias and neck pain. Negative for gait problem and joint swelling.  Neurological: Negative for syncope, weakness and headaches.  Psychiatric/Behavioral: Positive for sleep disturbance. Negative for confusion. The patient is nervous/anxious.       Current Outpatient Prescriptions on File Prior to Visit  Medication Sig Dispense Refill  . amLODipine (NORVASC) 10 MG tablet Take 1 tablet (10 mg total) by mouth daily. MUST  ATTEND APPOINTMENT 31 tablet 1  . aspirin 81 MG tablet Take 81 mg by mouth daily.    . diphenhydramine-acetaminophen (TYLENOL PM) 25-500 MG TABS Take 1 tablet by mouth at bedtime as needed (for sleep).    [name] levonorgestrel (MIRENA) 20 MCG/24HR IUD 1 each by Intrauterine route once.    . metoprolol (LOPRESSOR) 50 MG tablet Take 1 tablet (50 mg total) by mouth 3 (three) times daily. Pt  is overdue for an appointment. Please call and schedule for further refills 90 tablet 1  . nitroGLYCERIN (NITROSTAT) 0.4 MG SL tablet Place 0.4 mg under the tongue every 5 (five) minutes as needed for chest pain.     No current facility-administered medications on file prior to visit.      Past Medical History:  Diagnosis Date  . Anginal pain ([place])   . [place], [place]  . Depression   . GERD (gastroesophageal reflux disease)   . History of cardiac cath 07/23/2012   Dr Irish Lack  . History of echocardiogram    Cardiomyopathy-reduced EF 35-40% with wall m otion abnormality concerning for ischemia. Hypokinesis of the anterior and anteroseptal walls to the apex  . HPV (human papilloma virus) infection   . Hyperlipidemia   . Hypertension   . PCOS (polycystic ovarian syndrome)   . PPD positive, treated 2008   [place], [place]   Allergies  Allergen Reactions  . Percocet [Oxycodone-Acetaminophen] Nausea Only    Family History  Problem Relation Age of Onset  . Hypertension Mother   . Hyperlipidemia Mother   . Heart failure Paternal Grandmother   . Hypertension Paternal Grandmother   . Diabetes Paternal Grandmother   . Glaucoma Paternal Grandmother   . Other Father     drug overdose    Social History   Social History  . Marital status: Single    Spouse name: N/A  . Number of children: N/A  . Years of education: N/A   Social History Main Topics  . Smoking status: Current Every Day Smoker    Packs/day: 0.50    Years: 16.00  . Smokeless tobacco: Never Used  . Alcohol use 0.0 oz/week  . Drug use: No  . Sexual activity: Yes    Birth control/ protection: IUD   Other Topics Concern  . None   Social History Narrative  . None    Vitals:   05/15/16 1008  BP: 130/80  Pulse: 74  Resp: 12   O2 sat at RA 98%  Body mass index is 46.26 kg/m.    Physical Exam  Nursing note and vitals reviewed. Constitutional: She is oriented to person, place, and time. She  appears well-developed. No distress.  HENT:  Head: Atraumatic.  Mouth/Throat: Oropharynx is clear and moist and mucous membranes are normal.  Eyes: Conjunctivae and EOM are normal. Pupils are equal, round, and reactive to light.  Cardiovascular: Normal rate and regular rhythm.   No murmur heard. Pulses:      Dorsalis pedis pulses are 2+ on the right side, and 2+ on the left side.  Respiratory: Effort normal and breath sounds normal. No respiratory distress.  GI: Soft. She exhibits no mass. There is no hepatomegaly. There is no tenderness.  Musculoskeletal: She exhibits no edema.       Cervical back: She exhibits tenderness. She exhibits normal range of motion and no bony tenderness.  Right cervical paraspinosus muscles tenderness upon palpation. Negative impingement, empty can test, and Hawkin's test. Mild limitation  ROM right shoulder. Knee crepitus bilateral, no pain elicited with movement, otherwise normal ROM.  Neurological: She is alert and oriented to person, place, and time. She has normal strength. Coordination and gait normal.  Skin: Skin is warm. No erythema.  Psychiatric: Her mood appears anxious. She expresses no suicidal ideation.  Well groomed, good eye contact.      ASSESSMENT AND PLAN:     Tammy Hogan was seen today for establish care.  Diagnoses and all orders for this visit:   Right shoulder pain, unspecified chronicity  ? OA, radicular pain. Appt with ortho will be arranged.  -     Ambulatory referral to Orthopedic Surgery  Generalized anxiety disorder  We discussed some treatment options including Cymbalta (which may also help with arthralgias) and Wellbutrin. She prefers Wellbutrin because would like to try to quit smoking. We dicussed some side effects. Instructed about warning signs and f/u in 6 weeks.  -     buPROPion (WELLBUTRIN SR) 150 MG 12 hr tablet; Take 1 tablet (150 mg total) by mouth 2 (two) times daily.  Tobacco use disorder  Adverse  effects discussed. We discussed a few treatment options and she would like to try Wellbutrin.  -     buPROPion (WELLBUTRIN SR) 150 MG 12 hr tablet; Take 1 tablet (150 mg total) by mouth 2 (two) times daily.  Essential hypertension   -     furosemide (LASIX) 20 MG tablet; Take 1 tablet (20 mg total) by mouth daily. -     lisinopril (PRINIVIL,ZESTRIL) 20 MG tablet; Take 1 tablet (20 mg total) by mouth daily.  Arthralgia of multiple joints  She may benefit from Cymbalta but preferred Wellbutrin because smoking cessation and wt loss. Wt loss may help with knee and back pain. For now OTC Tylenol, local heat, and ROM exercises.   Cervicalgia  With radiation. Baclofen may help, some side effects discussed, recommend taking as bedtime. Ortho referral placed.  -     Ambulatory referral to Orthopedic Surgery -     baclofen (LIORESAL) 10 MG tablet; Take 0.5-1 tablets (5-10 mg total) by mouth 3 (three) times daily.   BMI 45.0-49.9, adult ([place])  We discussed benefits of wt loss as well as adverse effects of obesity. Consistency with healthy diet and physical activity recommended. If interested Weight Watchers is a good option as well as daily brisk walking for 15-30 min as tolerated. Wellbutrin may also help.        Betty G. Martinique, MD  [facility]. [place] office.

## 2016-05-15 NOTE — Progress Notes (Signed)
Pre visit review using our clinic review tool, if applicable. No additional management support is needed unless otherwise documented below in the visit note. 

## 2016-05-20 ENCOUNTER — Encounter (HOSPITAL_COMMUNITY): Payer: Self-pay | Admitting: Emergency Medicine

## 2016-05-20 ENCOUNTER — Ambulatory Visit (HOSPITAL_COMMUNITY)
Admission: EM | Admit: 2016-05-20 | Discharge: 2016-05-20 | Disposition: A | Discharge status: Home / Self Care | Payer: 59 | Attending: Emergency Medicine | Admitting: Emergency Medicine

## 2016-05-20 ENCOUNTER — Ambulatory Visit (INDEPENDENT_AMBULATORY_CARE_PROVIDER_SITE_OTHER): Payer: 59

## 2016-05-20 DIAGNOSIS — M436 Torticollis: Secondary | ICD-10-CM

## 2016-05-20 MED ORDER — CYCLOBENZAPRINE HCL 10 MG PO TABS: 10.0000 mg | ORAL_TABLET | Freq: Three times a day (TID) | ORAL | 0 refills | Status: DC | PRN

## 2016-05-20 NOTE — Discharge Instructions (Signed)
Your x-ray shows some mild disc disease and signs of intense muscle spasms. Take the Flexeril 3 times a day to help with spasms and pain. Apply heat as much as you can to help the muscles relax. Follow-up with the orthopedic as scheduled. You may benefit from some physical therapy modalities.

## 2016-05-20 NOTE — ED Provider Notes (Signed)
[place]    CSN: [id number] Arrival date & time: 05/20/16  1200     History   Chief Complaint Chief Complaint  Patient presents with  . Torticollis    HPI Tammy Hogan is a 40 y.o. female.   HPI  She is a 40 year old woman here for evaluation of neck pain. She reports a long history of chronic right neck and shoulder problems. Yesterday, when she was wiping snow off of her car, it flared up. The pain is located in the right side of her neck. It is worse with neck movement. Denies any radiating pain. She does state if she has to hold her arm up for a while, like when using a mouse, her arm will go weak. She denies injuring it while wiping the snow off her car. She denies overstretching or twisting. She's been taking baclofen 3 times a day for the last week without improvement. She has also tried Advil and Aleve without improvement. She has been on Flexeril in the past with good improvement. No prior x-rays of the neck.  She did not take her blood pressure medicines yesterday or today.  Past Medical History:  Diagnosis Date  . Anginal pain ([place])   . [place], [place]  . Depression   . GERD (gastroesophageal reflux disease)   . History of cardiac cath 07/23/2012   Dr Irish Lack  . History of echocardiogram    Cardiomyopathy-reduced EF 35-40% with wall m otion abnormality concerning for ischemia. Hypokinesis of the anterior and anteroseptal walls to the apex  . HPV (human papilloma virus) infection   . Hyperlipidemia   . Hypertension   . PCOS (polycystic ovarian syndrome)   . PPD positive, treated 2008   [place], [place]    Patient Active Problem List   Diagnosis Date Noted  . Generalized anxiety disorder 05/15/2016  . Arthralgia of multiple joints 05/15/2016  . BMI 45.0-49.9, adult ([place]) 05/15/2016  . Essential hypertension 09/07/2014  . Bimalleolar fracture of left ankle 07/10/2013  . Cardiomyopathy ([place]) 07/02/2012  . Systolic heart failure ([place])  07/02/2012  . HTN (hypertension), malignant 07/01/2012  . Pulmonary edema 07/01/2012  . Back pain, acute 07/01/2012  . Medically noncompliant 07/01/2012  . Tobacco use disorder 07/01/2012    Past Surgical History:  Procedure Laterality Date  . CARDIAC CATHETERIZATION  feb, 2014   Dr Irish Lack  . ORIF ANKLE FRACTURE Left 07/10/2013   Procedure: OPEN REDUCTION INTERNAL FIXATION (ORIF) LEFT BIMALLEOLAR ANKLE FRACTURE;  Surgeon: Mcarthur Rossetti, MD;  Location: [facility];  Service: Orthopedics;  Laterality: Left;    OB History    No data available       Home Medications    Prior to Admission medications   Medication Sig Start Date End Date Taking? Authorizing Provider  amLODipine (NORVASC) 10 MG tablet Take 1 tablet (10 mg total) by mouth daily. MUST ATTEND APPOINTMENT 04/01/16   Dalia Heading, PA-C  aspirin 81 MG tablet Take 81 mg by mouth daily.    Historical Provider, MD  baclofen (LIORESAL) 10 MG tablet Take 0.5-1 tablets (5-10 mg total) by mouth 3 (three) times daily. 05/15/16   Betty G Martinique, MD  buPROPion (WELLBUTRIN SR) 150 MG 12 hr tablet Take 1 tablet (150 mg total) by mouth 2 (two) times daily. 05/15/16   Betty G Martinique, MD  cyclobenzaprine (FLEXERIL) 10 MG tablet Take 1 tablet (10 mg total) by mouth 3 (three) times daily as needed for muscle spasms.  05/20/16   Melony Overly, MD  diphenhydramine-acetaminophen (TYLENOL PM) 25-500 MG TABS Take 1 tablet by mouth at bedtime as needed (for sleep).    Historical Provider, MD  furosemide (LASIX) 20 MG tablet Take 1 tablet (20 mg total) by mouth daily. 05/15/16   Betty G Martinique, MD  levonorgestrel (MIRENA) 20 MCG/24HR IUD 1 each by Intrauterine route once.    Historical Provider, MD  lisinopril (PRINIVIL,ZESTRIL) 20 MG tablet Take 1 tablet (20 mg total) by mouth daily. 05/15/16   Betty G Martinique, MD  metoprolol (LOPRESSOR) 50 MG tablet Take 1 tablet (50 mg total) by mouth 3 (three) times daily. Pt is overdue for an appointment.  Please call and schedule for further refills 03/12/16   Jettie Booze, MD  nitroGLYCERIN (NITROSTAT) 0.4 MG SL tablet Place 0.4 mg under the tongue every 5 (five) minutes as needed for chest pain.    Historical Provider, MD    Family History Family History  Problem Relation Age of Onset  . Hypertension Mother   . Hyperlipidemia Mother   . Heart failure Paternal Grandmother   . Hypertension Paternal Grandmother   . Diabetes Paternal Grandmother   . Glaucoma Paternal Grandmother   . Other Father     drug overdose    Social History Social History  Substance Use Topics  . Smoking status: Current Every Day Smoker    Packs/day: 0.50    Years: 16.00  . Smokeless tobacco: Never Used  . Alcohol use No     Allergies   Percocet [oxycodone-acetaminophen]   Review of Systems Review of Systems As in history of present illness  Physical Exam Triage Vital Signs ED Triage Vitals  Enc Vitals Group     BP 05/20/16 1215 (!) 176/118     Pulse Rate 05/20/16 1215 71     Resp 05/20/16 1215 16     Temp 05/20/16 1215 98.1 F (36.7 C)     Temp Source 05/20/16 1215 Oral     SpO2 05/20/16 1215 100 %     Weight 05/20/16 1215 259 lb (117.5 kg)     Height 05/20/16 1215 5\' 4"  (1.626 m)     Head Circumference --      Peak Flow --      Pain Score 05/20/16 1216 7     Pain Loc --      Pain Edu? --      Excl. in [place]? --    No data found.   Updated Vital Signs BP (!) 176/118 Comment: has not taken BP meds since Friday  Pulse 71   Temp 98.1 F (36.7 C) (Oral)   Resp 16   Ht 5\' 4"  (1.626 m)   Wt 259 lb (117.5 kg)   SpO2 100%   BMI 44.46 kg/m   Visual Acuity Right Eye Distance:   Left Eye Distance:   Bilateral Distance:    Right Eye Near:   Left Eye Near:    Bilateral Near:     Physical Exam  Constitutional: She is oriented to person, place, and time. She appears well-developed and well-nourished. No distress.  Neck:  Holding neck very stiffly. No vertebral tenderness or  step-offs. She is tender along the right neck from the mastoid down to the shoulder crease. 5 out of 5 strength in bilateral upper extremities. 2+ right radial pulse.  Cardiovascular: Normal rate.   Pulmonary/Chest: Effort normal.  Neurological: She is alert and oriented to person, place, and time.  UC Treatments / Results  Labs (all labs ordered are listed, but only abnormal results are displayed) Labs Reviewed - No data to display  EKG  EKG Interpretation None       Radiology Dg Cervical Spine Complete  Result Date: 05/20/2016 CLINICAL DATA:  Right-sided neck pain for the past year, worsened during the past day. EXAM: CERVICAL SPINE - COMPLETE 4+ VIEW COMPARISON:  None. FINDINGS: C1 to the superior endplate of C7 is imaged on the provided neutral radiograph though there is adequate evaluation of the cervicothoracic junction on the provided swimmer's radiograph. There is straightening or reversal of the expected cervical lordosis with mild kyphosis centered about the C4-C5 articulation. No anterolisthesis or retrolisthesis. The dens is normally positioned between the lateral masses of C1. The bilateral facets appear normally aligned. Cervical vertebral body heights appear preserved. Prevertebral soft tissues appear normal. Mild multilevel cervical spine DDD, worse at C4-C5 with disc space height loss, endplate irregularity and sclerosis. The bilateral neural foramina appear widely patent given obliquity. Regional soft tissues appear normal. Limited visualization of the lung apices is normal. IMPRESSION: 1. Straightening and slight reversal the expected cervical lordosis, nonspecific though could be seen in the setting of muscle spasm. Otherwise, no acute findings. 2. 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 Electronically Signed   By: Sandi Mariscal M.D.   On: 05/20/2016 13:08    Procedures Procedures (including critical care time)  Medications Ordered in UC Medications - No  data to display   Initial Impression / Assessment and Plan / UC Course  I have reviewed the triage vital signs and the nursing notes.  Pertinent labs & imaging results that were available during my care of the patient were reviewed by me and considered in my medical decision making (see chart for details).  Clinical Course     No bony abnormality, but does have reversal of normal lordosis. We'll treat for muscle spasm with Flexeril, she states this has worked well in the past. Recommended frequent application of heat. She will follow-up with the orthopedic on Wednesday, sooner if she can.  Final Clinical Impressions(s) / UC Diagnoses   Final diagnoses:  Torticollis, acquired    New Prescriptions Discharge Medication List as of 05/20/2016  1:28 PM    START taking these medications   Details  cyclobenzaprine (FLEXERIL) 10 MG tablet Take 1 tablet (10 mg total) by mouth 3 (three) times daily as needed for muscle spasms., Starting Sun 05/20/2016, Normal         Melony Overly, MD 05/20/16 1351

## 2016-05-20 NOTE — ED Triage Notes (Signed)
PT has seen the ED and PCP for chronic neck pain. PT reports severe muscle spasm that started after PT brushed snow off her car yesterday. PT has been started on Baclofen 10mg  TID and reports it is not working. PT sees ortho on Wednesday, but cannot wait that long with the pain and "can't work like this."   PT is ambulatory

## 2016-05-21 ENCOUNTER — Telehealth: Payer: Self-pay | Admitting: Family Medicine

## 2016-05-21 ENCOUNTER — Ambulatory Visit (INDEPENDENT_AMBULATORY_CARE_PROVIDER_SITE_OTHER): Payer: 59 | Admitting: Physician Assistant

## 2016-05-21 ENCOUNTER — Encounter (INDEPENDENT_AMBULATORY_CARE_PROVIDER_SITE_OTHER): Payer: Self-pay | Admitting: Physician Assistant

## 2016-05-21 DIAGNOSIS — M542 Cervicalgia: Secondary | ICD-10-CM

## 2016-05-21 MED ORDER — METHYLPREDNISOLONE 4 MG PO TABS: ORAL_TABLET | ORAL | 0 refills | Status: DC

## 2016-05-21 NOTE — Progress Notes (Signed)
Office Visit Note   Patient: Tammy Hogan           Date of Birth: 1975-08-14           MRN: [id number] Visit Date: 05/21/2016              Requested by: Betty G Martinique, MD [address] [place], [place] [address] PCP: Betty Martinique, MD   Assessment & Plan: Visit Diagnoses:  1. Cervicalgia     Plan: Place her on a Medrol Dosepak and Flexeril from the ER. Send her to  formal physical therapy for modalities range of motion back and home exercise program  Follow-Up Instructions: Return in about 2 weeks (around 06/04/2016).   Orders:  No orders of the defined types were placed in this encounter.  Meds ordered this encounter  Medications  . methylPREDNISolone (MEDROL) 4 MG tablet    Sig: TAKE AS DIRECTED    Dispense:  21 tablet    Refill:  0      Procedures: No procedures performed   Clinical Data: No additional findings.   Subjective: Chief Complaint  Patient presents with  . Neck - Pain  . Right Shoulder - Pain    Patient states severe muscle spasms for over a year, but has gotten worse lately.  States she hurt her neck while removing snow.  Went to [employer].  Tender to the touch.  Pain is radiating into right shoulder, Pain can cause pain when raising right arm above her head. Pain does not radiate down either arm. She states she has flares like this at least a couple times a year.  4 views of the cervical spine were obtained yesterday at the urgent care and showed loss of the lordotic curvature. No acute fractures no spondylolisthesis and disc space well-maintained.  Review of Systems See history of present illness  Objective: Vital Signs: Ht 5\' 4"  (1.626 m)   Wt 259 lb (117.5 kg)   BMI 44.46 kg/m   Physical Exam  Constitutional: She is oriented to person, place, and time. She appears well-developed and well-nourished. No distress.  Pulmonary/Chest: Effort normal.  Neurological: She is alert and oriented to person, place, and time.  Psychiatric: She  has a normal mood and affect.    Ortho Exam Cervical spine: Limited forward flexion and extension. Negative Spurling's tenderness right paraspinous region of the mid to lower cervical spine. Upper extremities: 5 out of 5 strengths throughout sensation intact bilateral hands to light touch motor bilateral hands. Radial pulses are 2+ bilaterally and equal symmetric Specialty Comments:  No specialty comments available.  Imaging: No results found.   PMFS History: Patient Active Problem List   Diagnosis Date Noted  . Cervicalgia 05/21/2016  . Generalized anxiety disorder 05/15/2016  . Arthralgia of multiple joints 05/15/2016  . BMI 45.0-49.9, adult ([place]) 05/15/2016  . Essential hypertension 09/07/2014  . Bimalleolar fracture of left ankle 07/10/2013  . Cardiomyopathy ([place]) 07/02/2012  . Systolic heart failure ([place]) 07/02/2012  . HTN (hypertension), malignant 07/01/2012  . Pulmonary edema 07/01/2012  . Back pain, acute 07/01/2012  . Medically noncompliant 07/01/2012  . Tobacco use disorder 07/01/2012   Past Medical History:  Diagnosis Date  . Anginal pain ([place])   . [place], [place]  . Depression   . GERD (gastroesophageal reflux disease)   . History of cardiac cath 07/23/2012   Dr Irish Lack  . History of echocardiogram    Cardiomyopathy-reduced EF 35-40% with wall m otion  abnormality concerning for ischemia. Hypokinesis of the anterior and anteroseptal walls to the apex  . HPV (human papilloma virus) infection   . Hyperlipidemia   . Hypertension   . PCOS (polycystic ovarian syndrome)   . PPD positive, treated 2008   [place], [place]    Family History  Problem Relation Age of Onset  . Hypertension Mother   . Hyperlipidemia Mother   . Heart failure Paternal Grandmother   . Hypertension Paternal Grandmother   . Diabetes Paternal Grandmother   . Glaucoma Paternal Grandmother   . Other Father     drug overdose    Past Surgical History:  Procedure Laterality Date  .  CARDIAC CATHETERIZATION  feb, 2014   Dr Irish Lack  . ORIF ANKLE FRACTURE Left 07/10/2013   Procedure: OPEN REDUCTION INTERNAL FIXATION (ORIF) LEFT BIMALLEOLAR ANKLE FRACTURE;  Surgeon: Mcarthur Rossetti, MD;  Location: [facility];  Service: Orthopedics;  Laterality: Left;   Social History   Occupational History  . Not on file.   Social History Main Topics  . Smoking status: Current Every Day Smoker    Packs/day: 0.50    Years: 16.00  . Smokeless tobacco: Never Used  . Alcohol use No  . Drug use: No  . Sexual activity: Yes    Birth control/ protection: IUD

## 2016-05-21 NOTE — Telephone Encounter (Signed)
Patient is asking to xfer to [facility] because of geographic needs.   Dr Martinique,  Is this ok with you?   Baldo Ash,  Is this ok with you?

## 2016-05-21 NOTE — Telephone Encounter (Signed)
This is fine with me. Thanks, BJ 

## 2016-05-22 NOTE — Telephone Encounter (Signed)
Fine with me

## 2016-05-23 ENCOUNTER — Ambulatory Visit (INDEPENDENT_AMBULATORY_CARE_PROVIDER_SITE_OTHER): Payer: BC Managed Care – PPO | Admitting: Orthopedic Surgery

## 2016-05-29 NOTE — Progress Notes (Signed)
Cardiology Office Note   Date:  05/30/2016   ID:  Tammy Hogan, DOB May 13, 1976, MRN [id number]  PCP:  Wilfred Lacy, NP    No chief complaint on file. NICM   Wt Readings from Last 3 Encounters:  05/30/16 260 lb 1.9 oz (118 kg)  05/21/16 259 lb (117.5 kg)  05/20/16 259 lb (117.5 kg)       History of Present Illness: Tammy Hogan is a 40 y.o. female  with obesity, PCOS and a nonischemic cardiomyopathy-diagnosed in 2014.  She has had difficulty controlled her BP.    BP is still high per her report.  Home readings around 0000000 range systolic if she takes her meds.   SHe continues to smoke, but has cut back.  She stopped for a while.  She checks BP 4x/week. She is no longer living with her Mom.  Her diet has improved.   She is running and walking for a few miles a day.  No chest discomfort.   She is now working for the city of [employer].     Past Medical History:  Diagnosis Date  . Anginal pain ([place])   . [place], [place]  . Depression   . GERD (gastroesophageal reflux disease)   . History of cardiac cath 07/23/2012   Dr Irish Lack  . History of echocardiogram    Cardiomyopathy-reduced EF 35-40% with wall m otion abnormality concerning for ischemia. Hypokinesis of the anterior and anteroseptal walls to the apex  . HPV (human papilloma virus) infection   . Hyperlipidemia   . Hypertension   . PCOS (polycystic ovarian syndrome)   . PPD positive, treated 2008   [place],     Past Surgical History:  Procedure Laterality Date  . CARDIAC CATHETERIZATION  feb, 2014   Dr Irish Lack  . ORIF ANKLE FRACTURE Left 07/10/2013   Procedure: OPEN REDUCTION INTERNAL FIXATION (ORIF) LEFT BIMALLEOLAR ANKLE FRACTURE;  Surgeon: Mcarthur Rossetti, MD;  Location: [facility];  Service: Orthopedics;  Laterality: Left;     Current Outpatient Prescriptions  Medication Sig Dispense Refill  . amLODipine (NORVASC) 10 MG tablet Take 1 tablet (10 mg total) by mouth daily. MUST  ATTEND APPOINTMENT 31 tablet 1  . aspirin 81 MG tablet Take 81 mg by mouth daily.    . baclofen (LIORESAL) 10 MG tablet Take 0.5-1 tablets (5-10 mg total) by mouth 3 (three) times daily. 30 each 0  . buPROPion (WELLBUTRIN SR) 150 MG 12 hr tablet Take 1 tablet (150 mg total) by mouth 2 (two) times daily. 60 tablet 1  . diphenhydramine-acetaminophen (TYLENOL PM) 25-500 MG TABS Take 1 tablet by mouth at bedtime as needed (for sleep).    . furosemide (LASIX) 20 MG tablet Take 1 tablet (20 mg total) by mouth daily. 30 tablet 0  . levonorgestrel (MIRENA) 20 MCG/24HR IUD 1 each by Intrauterine route once.    [name] lisinopril (PRINIVIL,ZESTRIL) 20 MG tablet Take 1 tablet (20 mg total) by mouth daily. 30 tablet 0  . metoprolol (LOPRESSOR) 50 MG tablet Take 1 tablet (50 mg total) by mouth 3 (three) times daily. Pt is overdue for an appointment. Please call and schedule for further refills 90 tablet 1  . nitroGLYCERIN (NITROSTAT) 0.4 MG SL tablet Place 0.4 mg under the tongue every 5 (five) minutes as needed for chest pain.     No current facility-administered medications for this visit.     Allergies:   Percocet [oxycodone-acetaminophen]    Social  History:  The patient  reports that she has been smoking.  She has a 8.00 pack-year smoking history. She has never used smokeless tobacco. She reports that she does not drink alcohol or use drugs.   Family History:  The patient's family history includes Diabetes in her paternal grandmother; Glaucoma in her paternal grandmother; Heart failure in her paternal grandmother; Hyperlipidemia in her mother; Hypertension in her mother and paternal grandmother; Other in her father.    ROS:  Please see the history of present illness.   Otherwise, review of systems are positive for decreasing smoking.   All other systems are reviewed and negative.    PHYSICAL EXAM: VS:  BP (!) 144/90 (BP Location: Left Arm)   Pulse 72   Ht 5' 3.75" (1.619 m)   Wt 260 lb 1.9 oz (118 kg)    BMI 45.00 kg/m  , BMI Body mass index is 45 kg/m. GEN: Well nourished, well developed, in no acute distress  HEENT: normal  Neck: no JVD, carotid bruits, or masses Cardiac: RRR; no murmurs, rubs, or gallops,no edema  Respiratory:  clear to auscultation bilaterally, normal work of breathing GI: soft, nontender, nondistended, + BS MS: no deformity or atrophy  Skin: warm and dry, no rash Neuro:  Strength and sensation are intact Psych: euthymic mood, full affect   EKG:   The ekg ordered today demonstrates Normal ECG   Recent Labs: No results found for requested labs within last 8760 hours.   Lipid Panel    Component Value Date/Time   CHOL 189 07/03/2012 0510   TRIG 174 (H) 07/03/2012 0510   HDL 34 (L) 07/03/2012 0510   CHOLHDL 5.6 07/03/2012 0510   VLDL 35 07/03/2012 0510   LDLCALC 120 (H) 07/03/2012 0510     Other studies Reviewed: Additional studies/ records that were reviewed today with results demonstrating: LDL 153 in 9/17.   ASSESSMENT AND PLAN:  1. NICM: May be related BP. She had no CAD by cath in 2014. EF 35% in 2014.  Echo in 2/16 was back to normal. 2. HTN: BP has been higher at home, 123456 systolic.  SHe has been stressed due to being out of a job.  BP here today is better.  Her diet is better. She is taking her meds regularly.  F/u with [place] has not been good in the past , but better of late.   She will call in with some BP readings.  Could increase lisinopril if needed.   Her insurance will be running out soon, but she has 4 months of meds. 3. SHOB: resolved.  4. In the past, we discussed possible pregnancy for her at length. I think she would be high risk for peripartum cardiomyopathy. She is also concerned that due to her polycystic ovarian syndrome, she would not get pregnant. Her OB/GYN did not approve her getting pregnant. She is now comfortable with this.  Wil not adjust her BP meds. Smoking: She needs to stop completely.  SHe has cut back.  Syncope with  Chantix.  She is getting the patches through insurance. She is mentally ready to quit.  Hyperlipidemia: Despite exercise and improved diet, LDL has increased to 153.  Start atorvastatin 10 mg daily.  Recheck labs in 2-3 months.   She has made great improvements.  She remains compliant and eating Her PCOS may make it difficult to lose weight.  COntinue with current lifestyle modification. .        Current medicines are reviewed at  length with the patient today.  The patient concerns regarding her medicines were addressed.  The following changes have been made:  No change  Labs/ tests ordered today include: lipids in a few months No orders of the defined types were placed in this encounter.   Recommend 150 minutes/week of aerobic exercise Low fat, low carb, high fiber diet recommended  Disposition:   FU in 1 year   Signed, Larae Grooms, MD  05/30/2016 9:11 AM    [facility] [place], [place], [place]  [address] Phone: [phone]; Fax: [phone]

## 2016-05-30 ENCOUNTER — Encounter: Payer: Self-pay | Admitting: Interventional Cardiology

## 2016-05-30 ENCOUNTER — Ambulatory Visit (INDEPENDENT_AMBULATORY_CARE_PROVIDER_SITE_OTHER): Payer: 59 | Admitting: Interventional Cardiology

## 2016-05-30 VITALS — BP 144/90 | HR 72 | Ht 63.75 in | Wt 260.1 lb

## 2016-05-30 DIAGNOSIS — E782 Mixed hyperlipidemia: Secondary | ICD-10-CM

## 2016-05-30 DIAGNOSIS — I10 Essential (primary) hypertension: Secondary | ICD-10-CM

## 2016-05-30 DIAGNOSIS — I420 Dilated cardiomyopathy: Secondary | ICD-10-CM

## 2016-05-30 DIAGNOSIS — F17200 Nicotine dependence, unspecified, uncomplicated: Secondary | ICD-10-CM

## 2016-05-30 MED ORDER — ATORVASTATIN CALCIUM 10 MG PO TABS: 10.0000 mg | ORAL_TABLET | Freq: Every day | ORAL | 3 refills | Status: DC

## 2016-05-30 MED ORDER — NITROGLYCERIN 0.4 MG SL SUBL: 0.4000 mg | SUBLINGUAL_TABLET | SUBLINGUAL | 3 refills | Status: DC | PRN

## 2016-05-30 MED ORDER — METOPROLOL TARTRATE 50 MG PO TABS: 50.0000 mg | ORAL_TABLET | Freq: Three times a day (TID) | ORAL | 3 refills | Status: DC

## 2016-05-30 MED ORDER — FUROSEMIDE 20 MG PO TABS: 20.0000 mg | ORAL_TABLET | Freq: Every day | ORAL | 3 refills | Status: DC

## 2016-05-30 MED ORDER — AMLODIPINE BESYLATE 10 MG PO TABS: 10.0000 mg | ORAL_TABLET | Freq: Every day | ORAL | 3 refills | Status: DC

## 2016-05-30 MED ORDER — LISINOPRIL 20 MG PO TABS: 20.0000 mg | ORAL_TABLET | Freq: Every day | ORAL | 3 refills | Status: DC

## 2016-05-30 NOTE — Addendum Note (Signed)
**Note De-Identified Travus Oren Obfuscation** Addended by: Dennie Fetters on: 05/30/2016 09:52 AM   Modules accepted: Orders

## 2016-05-30 NOTE — Patient Instructions (Signed)
Medication Instructions:  Start taking Atorvastatin 10 mg daily. All other medications remain the same.  Labwork: CMET and Lipids on March 5 anytime between 7:30 and 5:00. Please do not eat or drink after midnight the night before.  Testing/Procedures: None  Follow-Up: Your physician wants you to follow-up in: 1 year. You will receive a reminder letter in the mail two months in advance. If you don't receive a letter, please call our office to schedule the follow-up appointment.     If you need a refill on your cardiac medications before your next appointment, please call your pharmacy.

## 2016-06-14 ENCOUNTER — Other Ambulatory Visit: Payer: Self-pay | Admitting: Obstetrics and Gynecology

## 2016-06-14 DIAGNOSIS — R928 Other abnormal and inconclusive findings on diagnostic imaging of breast: Secondary | ICD-10-CM

## 2016-06-18 ENCOUNTER — Ambulatory Visit
Admission: RE | Admit: 2016-06-18 | Discharge: 2016-06-18 | Disposition: A | Discharge status: Home / Self Care | Payer: 59 | Source: Ambulatory Visit | Attending: Obstetrics and Gynecology | Admitting: Obstetrics and Gynecology

## 2016-06-18 DIAGNOSIS — R928 Other abnormal and inconclusive findings on diagnostic imaging of breast: Secondary | ICD-10-CM

## 2016-06-18 DIAGNOSIS — N6313 Unspecified lump in the right breast, lower outer quadrant: Secondary | ICD-10-CM | POA: Diagnosis not present

## 2016-06-19 ENCOUNTER — Ambulatory Visit: Payer: 59 | Admitting: Family Medicine

## 2016-06-20 ENCOUNTER — Ambulatory Visit (INDEPENDENT_AMBULATORY_CARE_PROVIDER_SITE_OTHER): Payer: 59 | Admitting: Nurse Practitioner

## 2016-06-20 ENCOUNTER — Encounter: Payer: Self-pay | Admitting: Nurse Practitioner

## 2016-06-20 VITALS — BP 110/60 | HR 84 | Temp 98.4°F | Resp 16 | Ht 63.0 in | Wt 261.0 lb

## 2016-06-20 DIAGNOSIS — K219 Gastro-esophageal reflux disease without esophagitis: Secondary | ICD-10-CM | POA: Diagnosis not present

## 2016-06-20 DIAGNOSIS — F411 Generalized anxiety disorder: Secondary | ICD-10-CM | POA: Diagnosis not present

## 2016-06-20 DIAGNOSIS — I10 Essential (primary) hypertension: Secondary | ICD-10-CM | POA: Diagnosis not present

## 2016-06-20 DIAGNOSIS — I5022 Chronic systolic (congestive) heart failure: Secondary | ICD-10-CM | POA: Diagnosis not present

## 2016-06-20 NOTE — Progress Notes (Signed)
Subjective:  Patient ID: Tammy Hogan, female    DOB: 1975/07/24  Age: 41 y.o. MRN: [id number]  CC: Establish Care (comes from [place] - c/o GERD)  Gastroesophageal Reflux  She complains of belching, globus sensation, heartburn, nausea, tooth decay and water brash. She reports no chest pain, no choking, no coughing, no dysphagia, no early satiety, no sore throat, no stridor or no wheezing. This is a recurrent problem. The current episode started more than 1 month ago. The problem occurs frequently. The problem has been waxing and waning. The heartburn is located in the substernum. The heartburn is of moderate intensity. The heartburn does not wake her from sleep. The heartburn does not limit her activity. The heartburn doesn't change with position. The symptoms are aggravated by certain foods. Pertinent negatives include no anemia, fatigue, melena, muscle weakness, orthopnea or weight loss. Risk factors include obesity, smoking/tobacco exposure, lack of exercise, NSAIDs, caffeine use and ETOH use. She has tried nothing for the symptoms.  she is not intered in medication use at this time.  Depression and Anxiety: Celexa, and lexapro used in past. Medications switched due to cost and insurance coverage. Symptoms are not controlled with wellbutrin but is not will to change at this time. Symptoms triggered by break up from boyfriend recently. She will contact psychologist. She is not interested in seeing a psychiatrist at this time. occassional has thoughts of hurting herself but has not thought of plan.  HTN and CHF: Stable with medication. Last OV with cardiologist 05/30/16.  Outpatient Medications Prior to Visit  Medication Sig Dispense Refill  . amLODipine (NORVASC) 10 MG tablet Take 1 tablet (10 mg total) by mouth daily. 90 tablet 3  . aspirin 81 MG tablet Take 81 mg by mouth daily.    [name] atorvastatin (LIPITOR) 10 MG tablet Take 1 tablet (10 mg total) by mouth daily. 90 tablet 3  .  buPROPion (WELLBUTRIN SR) 150 MG 12 hr tablet Take 1 tablet (150 mg total) by mouth 2 (two) times daily. 60 tablet 1  . diphenhydramine-acetaminophen (TYLENOL PM) 25-500 MG TABS Take 1 tablet by mouth at bedtime as needed (for sleep).    . furosemide (LASIX) 20 MG tablet Take 1 tablet (20 mg total) by mouth daily. 90 tablet 3  . levonorgestrel (MIRENA) 20 MCG/24HR IUD 1 each by Intrauterine route once.    [name] lisinopril (PRINIVIL,ZESTRIL) 20 MG tablet Take 1 tablet (20 mg total) by mouth daily. 90 tablet 3  . metoprolol (LOPRESSOR) 50 MG tablet Take 1 tablet (50 mg total) by mouth 3 (three) times daily. Pt is overdue for an appointment. Please call and schedule for further refills 90 tablet 3  . nitroGLYCERIN (NITROSTAT) 0.4 MG SL tablet Place 1 tablet (0.4 mg total) under the tongue every 5 (five) minutes as needed for chest pain. 25 tablet 3  . baclofen (LIORESAL) 10 MG tablet Take 0.5-1 tablets (5-10 mg total) by mouth 3 (three) times daily. (Patient not taking: Reported on 06/20/2016) 30 each 0   No facility-administered medications prior to visit.     ROS See HPI  Objective:  BP 110/60   Pulse 84   Temp 98.4 F (36.9 C) (Oral)   Resp 16   Ht 5\' 3"  (1.6 m)   Wt 261 lb (118.4 kg)   SpO2 98%   BMI 46.23 kg/m   BP Readings from Last 3 Encounters:  06/20/16 110/60  05/30/16 (!) 144/90  05/20/16 (!) 176/118    Wt Readings from Last  3 Encounters:  06/20/16 261 lb (118.4 kg)  05/30/16 260 lb 1.9 oz (118 kg)  05/21/16 259 lb (117.5 kg)    Physical Exam  Constitutional: She is oriented to person, place, and time. No distress.  HENT:  Mouth/Throat: No oropharyngeal exudate.  Neck: Normal range of motion. Neck supple.  Cardiovascular: Normal rate and normal heart sounds.   Pulmonary/Chest: Effort normal and breath sounds normal.  Abdominal: Soft. Bowel sounds are normal.  Musculoskeletal: She exhibits no edema.  Lymphadenopathy:    She has no cervical adenopathy.    Neurological: She is alert and oriented to person, place, and time.  Vitals reviewed.   Lab Results  Component Value Date   WBC 7.0 07/10/2013   HGB 12.5 07/10/2013   HCT 38.8 07/10/2013   PLT 459 (H) 07/10/2013   GLUCOSE 85 02/16/2014   CHOL 189 07/03/2012   TRIG 174 (H) 07/03/2012   HDL 34 (L) 07/03/2012   LDLCALC 120 (H) 07/03/2012   ALT 9 07/10/2013   AST 17 07/10/2013   NA 137 02/16/2014   K 4.0 02/16/2014   CL 103 02/16/2014   CREATININE 0.8 02/16/2014   BUN 8 02/16/2014   CO2 30 02/16/2014   TSH 3.182 07/01/2012   HGBA1C 5.4 07/03/2012    US Breast Ltd Uni Right Inc Axilla  Result Date: 06/18/2016 CLINICAL DATA:  Patient recalled from screening for right breast mass. EXAM: 2D DIGITAL DIAGNOSTIC RIGHT MAMMOGRAM WITH CAD AND ADJUNCT TOMO ULTRASOUND RIGHT BREAST COMPARISON:  None ACR Breast Density Category a: The breast tissue is almost entirely fatty. FINDINGS: Within the inferior slightly lateral right breast anterior depth there is a small 6 mm oval circumscribed low-attenuation mass. Mammographic images were processed with CAD. On physical exam, I palpate no discrete mass within the lower slightly lateral right breast anteriorly. Targeted ultrasound is performed, showing a 7 x 8 x 3 mm oval circumscribed hypoechoic mass within the right breast 7 o'clock position 2 cm from the nipple, potentially corresponding with mammographic abnormality. IMPRESSION: Probably benign right breast mass 7 o'clock position, favored to represent a complicated cyst versus fibroadenoma. RECOMMENDATION: Right breast diagnostic mammography and ultrasound in 6 months to demonstrate stability of probably benign right breast mass. I have discussed the findings and recommendations with the patient. Results were also provided in writing at the conclusion of the visit. If applicable, a reminder letter will be sent to the patient regarding the next appointment. BI-RADS CATEGORY  3: Probably benign.  Electronically Signed   By: Lovey Newcomer M.D.   On: 06/18/2016 09:26   Mm Diag Breast Tomo Uni Right  Result Date: 06/18/2016 CLINICAL DATA:  Patient recalled from screening for right breast mass. EXAM: 2D DIGITAL DIAGNOSTIC RIGHT MAMMOGRAM WITH CAD AND ADJUNCT TOMO ULTRASOUND RIGHT BREAST COMPARISON:  None ACR Breast Density Category a: The breast tissue is almost entirely fatty. FINDINGS: Within the inferior slightly lateral right breast anterior depth there is a small 6 mm oval circumscribed low-attenuation mass. Mammographic images were processed with CAD. On physical exam, I palpate no discrete mass within the lower slightly lateral right breast anteriorly. Targeted ultrasound is performed, showing a 7 x 8 x 3 mm oval circumscribed hypoechoic mass within the right breast 7 o'clock position 2 cm from the nipple, potentially corresponding with mammographic abnormality. IMPRESSION: Probably benign right breast mass 7 o'clock position, favored to represent a complicated cyst versus fibroadenoma. RECOMMENDATION: Right breast diagnostic mammography and ultrasound in 6 months to demonstrate stability of probably  benign right breast mass. I have discussed the findings and recommendations with the patient. Results were also provided in writing at the conclusion of the visit. If applicable, a reminder letter will be sent to the patient regarding the next appointment. BI-RADS CATEGORY  3: Probably benign. Electronically Signed   By: Lovey Newcomer M.D.   On: 06/18/2016 09:26    Assessment & Plan:   Solange was seen today for establish care.  Diagnoses and all orders for this visit:  Generalized anxiety disorder  Essential hypertension  Chronic systolic heart failure (HCC)  Gastroesophageal reflux disease, esophagitis presence not specified   I am having Ms. Maitland maintain her diphenhydramine-acetaminophen, aspirin, levonorgestrel, buPROPion, baclofen, furosemide, lisinopril, metoprolol, nitroGLYCERIN,  atorvastatin, and amLODipine.  No orders of the defined types were placed in this encounter.   Follow-up: Return in about 3 months (around 09/18/2016) for hyperlipidemia, HTN, depression, anxiety.  Wilfred Lacy, NP

## 2016-06-20 NOTE — Progress Notes (Signed)
Pre visit review using our clinic review tool, if applicable. No additional management support is needed unless otherwise documented below in the visit note. 

## 2016-06-20 NOTE — Patient Instructions (Addendum)
Call 911 or Mobile Crisis: [phone] if any homicidal or suicidal ideation.   Food Choices for Gastroesophageal Reflux Disease, Adult When you have gastroesophageal reflux disease (GERD), the foods you eat and your eating habits are very important. Choosing the right foods can help ease your discomfort. What guidelines do I need to follow?  Choose fruits, vegetables, whole grains, and low-fat dairy products.  Choose low-fat meat, fish, and poultry.  Limit fats such as oils, salad dressings, butter, nuts, and avocado.  Keep a food diary. This helps you identify foods that cause symptoms.  Avoid foods that cause symptoms. These may be different for everyone.  Eat small meals often instead of 3 large meals a day.  Eat your meals slowly, in a place where you are relaxed.  Limit fried foods.  Cook foods using methods other than frying.  Avoid drinking alcohol.  Avoid drinking large amounts of liquids with your meals.  Avoid bending over or lying down until 2-3 hours after eating. What foods are not recommended? These are some foods and drinks that may make your symptoms worse: Vegetables  Tomatoes. Tomato juice. Tomato and spaghetti sauce. Chili peppers. Onion and garlic. Horseradish. Fruits  Oranges, grapefruit, and lemon (fruit and juice). Meats  High-fat meats, fish, and poultry. This includes hot dogs, ribs, ham, sausage, salami, and bacon. Dairy  Whole milk and chocolate milk. Sour cream. Cream. Butter. Ice cream. Cream cheese. Drinks  Coffee and tea. Bubbly (carbonated) drinks or energy drinks. Condiments  Hot sauce. Barbecue sauce. Sweets/Desserts  Chocolate and cocoa. Donuts. Peppermint and spearmint. Fats and Oils  High-fat foods. This includes [place] fries and potato chips. Other  Vinegar. Strong spices. This includes black pepper, white pepper, red pepper, cayenne, curry powder, cloves, ginger, and chili powder. The items listed above may not be a complete  list of foods and drinks to avoid. Contact your dietitian for more information.  This information is not intended to replace advice given to you by your health care provider. Make sure you discuss any questions you have with your health care provider. Document Released: 11/27/2011 Document Revised: 11/03/2015 Document Reviewed: 04/01/2013 Elsevier Interactive Patient Education  2017 [employer].

## 2016-08-13 ENCOUNTER — Other Ambulatory Visit: Payer: 59 | Admitting: *Deleted

## 2016-08-13 DIAGNOSIS — E782 Mixed hyperlipidemia: Secondary | ICD-10-CM

## 2016-08-13 LAB — COMPREHENSIVE METABOLIC PANEL
ALBUMIN: 4.1 g/dL (ref 3.5–5.5)
ALK PHOS: 96 IU/L (ref 39–117)
ALT: 14 IU/L (ref 0–32)
AST: 12 IU/L (ref 0–40)
Albumin/Globulin Ratio: 1.5 (ref 1.2–2.2)
BILIRUBIN TOTAL: 0.2 mg/dL (ref 0.0–1.2)
BUN / CREAT RATIO: 11 (ref 9–23)
BUN: 9 mg/dL (ref 6–24)
CHLORIDE: 101 mmol/L (ref 96–106)
CO2: 23 mmol/L (ref 18–29)
Calcium: 9.4 mg/dL (ref 8.7–10.2)
Creatinine, Ser: 0.81 mg/dL (ref 0.57–1.00)
GFR calc Af Amer: 104 mL/min/{1.73_m2} (ref >59)
GFR calc non Af Amer: 90 mL/min/{1.73_m2} (ref >59)
Globulin, Total: 2.8 g/dL (ref 1.5–4.5)
Glucose: 66 mg/dL (ref 65–99)
Potassium: 4.5 mmol/L (ref 3.5–5.2)
Sodium: 140 mmol/L (ref 134–144)
Total Protein: 6.9 g/dL (ref 6.0–8.5)

## 2016-08-13 LAB — LIPID PANEL
CHOLESTEROL TOTAL: 160 mg/dL (ref 100–199)
Chol/HDL Ratio: 4.3 ratio units (ref 0.0–4.4)
HDL: 37 mg/dL — ABNORMAL LOW (ref >39)
LDL Calculated: 97 mg/dL (ref 0–99)
Triglycerides: 131 mg/dL (ref 0–149)
VLDL CHOLESTEROL CAL: 26 mg/dL (ref 5–40)

## 2016-08-13 NOTE — Addendum Note (Signed)
Addended by: Eulis Foster on: 08/13/2016 11:39 AM   Modules accepted: Orders

## 2016-08-13 NOTE — Progress Notes (Signed)
p 

## 2016-09-14 ENCOUNTER — Telehealth: Payer: Self-pay | Admitting: Nurse Practitioner

## 2016-09-14 NOTE — Telephone Encounter (Signed)
Received refill request from [facility] for Bupropion Sr Tab. We never give pt this med, Please advise. Pt has appt with you on 09/18/16.

## 2016-09-14 NOTE — Telephone Encounter (Signed)
Wait for appt 09/18/16.

## 2016-09-17 NOTE — Telephone Encounter (Signed)
Noted in appt note for appt 09/18/16.

## 2016-09-18 ENCOUNTER — Ambulatory Visit: Payer: 59 | Admitting: Nurse Practitioner

## 2016-10-05 ENCOUNTER — Ambulatory Visit (INDEPENDENT_AMBULATORY_CARE_PROVIDER_SITE_OTHER): Payer: 59 | Admitting: Nurse Practitioner

## 2016-10-05 ENCOUNTER — Encounter: Payer: Self-pay | Admitting: Nurse Practitioner

## 2016-10-05 VITALS — BP 150/108 | HR 65 | Temp 98.3°F | Ht 63.0 in | Wt 260.0 lb

## 2016-10-05 DIAGNOSIS — F411 Generalized anxiety disorder: Secondary | ICD-10-CM

## 2016-10-05 DIAGNOSIS — F17200 Nicotine dependence, unspecified, uncomplicated: Secondary | ICD-10-CM

## 2016-10-05 DIAGNOSIS — L0233 Carbuncle of buttock: Secondary | ICD-10-CM

## 2016-10-05 MED ORDER — BUPROPION HCL ER (SR) 150 MG PO TB12: 150.0000 mg | ORAL_TABLET | Freq: Two times a day (BID) | ORAL | 5 refills | Status: DC

## 2016-10-05 MED ORDER — SULFAMETHOXAZOLE-TRIMETHOPRIM 800-160 MG PO TABS: 1.0000 | ORAL_TABLET | Freq: Two times a day (BID) | ORAL | 0 refills | Status: DC

## 2016-10-05 NOTE — Progress Notes (Signed)
Subjective:  Patient ID: Florence Canner, female    DOB: 1975-09-30  Age: 41 y.o. MRN: [id number]  CC: Follow-up (abcess between legs, pop today, this is going on for 3 days. req refill for bupropion sent [facility]?)   Rash  This is a new problem. The current episode started in the past 7 days. The problem is unchanged. Location: left inner thigh. The rash is characterized by pain and redness. She was exposed to nothing. Pertinent negatives include no fever or joint pain. Past treatments include nothing.   Anxiety: Needs Wellbutrin refill.  Outpatient Medications Prior to Visit  Medication Sig Dispense Refill  . amLODipine (NORVASC) 10 MG tablet Take 1 tablet (10 mg total) by mouth daily. 90 tablet 3  . aspirin 81 MG tablet Take 81 mg by mouth daily.    . diphenhydramine-acetaminophen (TYLENOL PM) 25-500 MG TABS Take 1 tablet by mouth at bedtime as needed (for sleep).    . furosemide (LASIX) 20 MG tablet Take 1 tablet (20 mg total) by mouth daily. 90 tablet 3  . levonorgestrel (MIRENA) 20 MCG/24HR IUD 1 each by Intrauterine route once.    [name] lisinopril (PRINIVIL,ZESTRIL) 20 MG tablet Take 1 tablet (20 mg total) by mouth daily. 90 tablet 3  . metoprolol (LOPRESSOR) 50 MG tablet Take 1 tablet (50 mg total) by mouth 3 (three) times daily. Pt is overdue for an appointment. Please call and schedule for further refills 90 tablet 3  . nitroGLYCERIN (NITROSTAT) 0.4 MG SL tablet Place 1 tablet (0.4 mg total) under the tongue every 5 (five) minutes as needed for chest pain. 25 tablet 3  . buPROPion (WELLBUTRIN SR) 150 MG 12 hr tablet Take 1 tablet (150 mg total) by mouth 2 (two) times daily. 60 tablet 1  . atorvastatin (LIPITOR) 10 MG tablet Take 1 tablet (10 mg total) by mouth daily. 90 tablet 3  . baclofen (LIORESAL) 10 MG tablet Take 0.5-1 tablets (5-10 mg total) by mouth 3 (three) times daily. (Patient not taking: Reported on 06/20/2016) 30 each 0   No facility-administered medications prior to  visit.     ROS See HPI  Objective:  BP (!) 150/108   Pulse 65   Temp 98.3 F (36.8 C)   Ht 5\' 3"  (1.6 m)   Wt 260 lb (117.9 kg)   SpO2 99%   BMI 46.06 kg/m   BP Readings from Last 3 Encounters:  10/05/16 (!) 150/108  06/20/16 110/60  05/30/16 (!) 144/90    Wt Readings from Last 3 Encounters:  10/05/16 260 lb (117.9 kg)  06/20/16 261 lb (118.4 kg)  05/30/16 260 lb 1.9 oz (118 kg)    Physical Exam  Constitutional: She is oriented to person, place, and time. No distress.  Musculoskeletal: She exhibits no edema.  Neurological: She is alert and oriented to person, place, and time.  Skin: Skin is warm and dry. Rash noted. Rash is pustular. There is erythema.     Left inner thigh pustule with induration and erythema. Minimal purulent drainage.   Vitals reviewed.   Lab Results  Component Value Date   WBC 7.0 07/10/2013   HGB 12.5 07/10/2013   HCT 38.8 07/10/2013   PLT 459 (H) 07/10/2013   GLUCOSE 66 08/13/2016   CHOL 160 08/13/2016   TRIG 131 08/13/2016   HDL 37 (L) 08/13/2016   LDLCALC 97 08/13/2016   ALT 14 08/13/2016   AST 12 08/13/2016   NA 140 08/13/2016   K 4.5 08/13/2016  CL 101 08/13/2016   CREATININE 0.81 08/13/2016   BUN 9 08/13/2016   CO2 23 08/13/2016   TSH 3.182 07/01/2012   HGBA1C 5.4 07/03/2012    US Breast Ltd Uni Right Inc Axilla  Result Date: 06/18/2016 CLINICAL DATA:  Patient recalled from screening for right breast mass. EXAM: 2D DIGITAL DIAGNOSTIC RIGHT MAMMOGRAM WITH CAD AND ADJUNCT TOMO ULTRASOUND RIGHT BREAST COMPARISON:  None ACR Breast Density Category a: The breast tissue is almost entirely fatty. FINDINGS: Within the inferior slightly lateral right breast anterior depth there is a small 6 mm oval circumscribed low-attenuation mass. Mammographic images were processed with CAD. On physical exam, I palpate no discrete mass within the lower slightly lateral right breast anteriorly. Targeted ultrasound is performed, showing a 7 x 8 x 3  mm oval circumscribed hypoechoic mass within the right breast 7 o'clock position 2 cm from the nipple, potentially corresponding with mammographic abnormality. IMPRESSION: Probably benign right breast mass 7 o'clock position, favored to represent a complicated cyst versus fibroadenoma. RECOMMENDATION: Right breast diagnostic mammography and ultrasound in 6 months to demonstrate stability of probably benign right breast mass. I have discussed the findings and recommendations with the patient. Results were also provided in writing at the conclusion of the visit. If applicable, a reminder letter will be sent to the patient regarding the next appointment. BI-RADS CATEGORY  3: Probably benign. Electronically Signed   By: Lovey Newcomer M.D.   On: 06/18/2016 09:26   Mm Diag Breast Tomo Uni Right  Result Date: 06/18/2016 CLINICAL DATA:  Patient recalled from screening for right breast mass. EXAM: 2D DIGITAL DIAGNOSTIC RIGHT MAMMOGRAM WITH CAD AND ADJUNCT TOMO ULTRASOUND RIGHT BREAST COMPARISON:  None ACR Breast Density Category a: The breast tissue is almost entirely fatty. FINDINGS: Within the inferior slightly lateral right breast anterior depth there is a small 6 mm oval circumscribed low-attenuation mass. Mammographic images were processed with CAD. On physical exam, I palpate no discrete mass within the lower slightly lateral right breast anteriorly. Targeted ultrasound is performed, showing a 7 x 8 x 3 mm oval circumscribed hypoechoic mass within the right breast 7 o'clock position 2 cm from the nipple, potentially corresponding with mammographic abnormality. IMPRESSION: Probably benign right breast mass 7 o'clock position, favored to represent a complicated cyst versus fibroadenoma. RECOMMENDATION: Right breast diagnostic mammography and ultrasound in 6 months to demonstrate stability of probably benign right breast mass. I have discussed the findings and recommendations with the patient. Results were also provided  in writing at the conclusion of the visit. If applicable, a reminder letter will be sent to the patient regarding the next appointment. BI-RADS CATEGORY  3: Probably benign. Electronically Signed   By: Lovey Newcomer M.D.   On: 06/18/2016 09:26    Assessment & Plan:   Darbie was seen today for follow-up.  Diagnoses and all orders for this visit:  Carbuncle of buttock -     sulfamethoxazole-trimethoprim (BACTRIM DS,SEPTRA DS) 800-160 MG tablet; Take 1 tablet by mouth 2 (two) times daily.  Generalized anxiety disorder -     buPROPion (WELLBUTRIN SR) 150 MG 12 hr tablet; Take 1 tablet (150 mg total) by mouth 2 (two) times daily.  Tobacco use disorder   I am having Ms. Maitland start on sulfamethoxazole-trimethoprim. I am also having her maintain her diphenhydramine-acetaminophen, aspirin, levonorgestrel, baclofen, furosemide, lisinopril, metoprolol, nitroGLYCERIN, atorvastatin, amLODipine, and buPROPion.  Meds ordered this encounter  Medications  . buPROPion (WELLBUTRIN SR) 150 MG 12 hr tablet  Sig: Take 1 tablet (150 mg total) by mouth 2 (two) times daily.    Dispense:  60 tablet    Refill:  5    Order Specific Question:   Supervising Provider    Answer:   Cassandria Anger [[id number]]  . sulfamethoxazole-trimethoprim (BACTRIM DS,SEPTRA DS) 800-160 MG tablet    Sig: Take 1 tablet by mouth 2 (two) times daily.    Dispense:  14 tablet    Refill:  0    Order Specific Question:   Supervising Provider    Answer:   Cassandria Anger [[id number]]    Follow-up: Return if symptoms worsen or fail to improve.  Wilfred Lacy, NP

## 2016-10-05 NOTE — Progress Notes (Signed)
Pre visit review using our clinic review tool, if applicable. No additional management support is needed unless otherwise documented below in the visit note. 

## 2016-10-05 NOTE — Patient Instructions (Signed)

## 2016-10-16 ENCOUNTER — Other Ambulatory Visit: Payer: Self-pay | Admitting: Interventional Cardiology

## 2016-10-22 ENCOUNTER — Ambulatory Visit: Payer: 59 | Admitting: Cardiology

## 2016-11-07 ENCOUNTER — Ambulatory Visit (INDEPENDENT_AMBULATORY_CARE_PROVIDER_SITE_OTHER): Payer: 59 | Admitting: Interventional Cardiology

## 2016-11-07 ENCOUNTER — Encounter: Payer: Self-pay | Admitting: Interventional Cardiology

## 2016-11-07 VITALS — BP 120/70 | HR 66 | Ht 63.0 in | Wt 259.2 lb

## 2016-11-07 DIAGNOSIS — L669 Cicatricial alopecia, unspecified: Secondary | ICD-10-CM | POA: Insufficient documentation

## 2016-11-07 DIAGNOSIS — F17200 Nicotine dependence, unspecified, uncomplicated: Secondary | ICD-10-CM | POA: Diagnosis not present

## 2016-11-07 DIAGNOSIS — E782 Mixed hyperlipidemia: Secondary | ICD-10-CM | POA: Diagnosis not present

## 2016-11-07 DIAGNOSIS — L659 Nonscarring hair loss, unspecified: Secondary | ICD-10-CM | POA: Diagnosis not present

## 2016-11-07 DIAGNOSIS — I10 Essential (primary) hypertension: Secondary | ICD-10-CM

## 2016-11-07 DIAGNOSIS — I420 Dilated cardiomyopathy: Secondary | ICD-10-CM

## 2016-11-07 NOTE — Progress Notes (Signed)
Cardiology Office Note   Date:  11/07/2016   ID:  Tammy Hogan, DOB Sep 18, 1975, MRN [id number]  PCP:  Flossie Buffy, NP    Chief Complaint  Patient presents with  . Follow-up  NICM   Wt Readings from Last 3 Encounters:  11/07/16 259 lb 3.2 oz (117.6 kg)  10/05/16 260 lb (117.9 kg)  06/20/16 261 lb (118.4 kg)       History of Present Illness: Tammy Hogan is a 41 y.o. female  with obesity, PCOS and a nonischemic cardiomyopathy-diagnosed in 2014.  She has had difficulty controlled her BP.     She continues to smoke, but has cut back.  She stopped for a while.  See below.   She is now working for the city of [employer].  Normal LV function noted in 2/16.  She is concerned about her hair loss and that the BP meds are contributing..  She continues to smoke, about 8 cigs/day, down from a pack a day.  Denies : Chest pain. Dizziness. Leg edema. Nitroglycerin use. Orthopnea. Palpitations. Paroxysmal nocturnal dyspnea. Shortness of breath. Syncope.  Other than hair loss, she is feeling well.      Past Medical History:  Diagnosis Date  . Anginal pain ([place])   . [place], [place]  . Depression   . GERD (gastroesophageal reflux disease)   . History of cardiac cath 07/23/2012   Dr Irish Lack  . History of echocardiogram    Cardiomyopathy-reduced EF 35-40% with wall m otion abnormality concerning for ischemia. Hypokinesis of the anterior and anteroseptal walls to the apex  . HPV (human papilloma virus) infection   . Hyperlipidemia   . Hypertension   . PCOS (polycystic ovarian syndrome)   . PPD positive, treated 2008   [place], [place]    Past Surgical History:  Procedure Laterality Date  . CARDIAC CATHETERIZATION  feb, 2014   Dr Irish Lack  . ORIF ANKLE FRACTURE Left 07/10/2013   Procedure: OPEN REDUCTION INTERNAL FIXATION (ORIF) LEFT BIMALLEOLAR ANKLE FRACTURE;  Surgeon: Mcarthur Rossetti, MD;  Location: [facility];  Service: Orthopedics;  Laterality: Left;      Current Outpatient Prescriptions  Medication Sig Dispense Refill  . amLODipine (NORVASC) 10 MG tablet Take 1 tablet (10 mg total) by mouth daily. 90 tablet 3  . aspirin 81 MG tablet Take 81 mg by mouth daily.    [name] buPROPion (WELLBUTRIN SR) 150 MG 12 hr tablet Take 1 tablet (150 mg total) by mouth 2 (two) times daily. 60 tablet 5  . diphenhydramine-acetaminophen (TYLENOL PM) 25-500 MG TABS Take 1 tablet by mouth at bedtime as needed (for sleep).    . furosemide (LASIX) 20 MG tablet Take 1 tablet (20 mg total) by mouth daily. 90 tablet 3  . levonorgestrel (MIRENA) 20 MCG/24HR IUD 1 each by Intrauterine route once.    [name] lisinopril (PRINIVIL,ZESTRIL) 20 MG tablet Take 1 tablet (20 mg total) by mouth daily. 90 tablet 3  . metoprolol (LOPRESSOR) 50 MG tablet TAKE 1 TABLET BY MOUTH 3  TIMES A DAY 270 tablet 1  . nitroGLYCERIN (NITROSTAT) 0.4 MG SL tablet Place 1 tablet (0.4 mg total) under the tongue every 5 (five) minutes as needed for chest pain. 25 tablet 3  . omeprazole (PRILOSEC) 20 MG capsule Take 20 mg by mouth daily.    [name] atorvastatin (LIPITOR) 10 MG tablet Take 1 tablet (10 mg total) by mouth daily. 90 tablet 3   No current facility-administered  medications for this visit.     Allergies:   Percocet [oxycodone-acetaminophen]    Social History:  The patient  reports that she has been smoking.  She has a 8.00 pack-year smoking history. She has never used smokeless tobacco. She reports that she does not drink alcohol or use drugs.   Family History:  The patient's family history includes Alcohol abuse in her paternal grandfather; Cancer in her maternal grandmother; Cancer (age of onset: 86) in her other; Cirrhosis in her paternal grandfather; Diabetes in her paternal grandmother; Drug abuse in her father; Glaucoma in her paternal grandmother; Heart failure in her paternal grandmother; Hyperlipidemia in her mother; Hypertension in her mother, paternal grandmother, and sister; Other in her  father.    ROS:  Please see the history of present illness.   Otherwise, review of systems are positive for decreasing smoking.   All other systems are reviewed and negative.    PHYSICAL EXAM: VS:  BP 120/70   Pulse 66   Ht 5\' 3"  (1.6 m)   Wt 259 lb 3.2 oz (117.6 kg)   SpO2 98%   BMI 45.92 kg/m  , BMI Body mass index is 45.92 kg/m. GEN: Well nourished, well developed, in no acute distress  HEENT: normal  Neck: no JVD, carotid bruits, or masses Cardiac: RRR; no murmurs, rubs, or gallops,no edema  Respiratory:  clear to auscultation bilaterally, normal work of breathing GI: soft, nontender, nondistended, obese MS: no deformity or atrophy  Skin: warm and dry, no rash; hair loss noted Neuro:  Strength and sensation are intact Psych: euthymic mood, full affect     Recent Labs: 08/13/2016: ALT 14; BUN 9; Creatinine, Ser 0.81; Potassium 4.5; Sodium 140   Lipid Panel    Component Value Date/Time   CHOL 160 08/13/2016 1139   TRIG 131 08/13/2016 1139   HDL 37 (L) 08/13/2016 1139   CHOLHDL 4.3 08/13/2016 1139   CHOLHDL 5.6 07/03/2012 0510   VLDL 35 07/03/2012 0510   LDLCALC 97 08/13/2016 1139     Other studies Reviewed: Additional studies/ records that were reviewed today with results demonstrating: LDL 153 in 9/17.   ASSESSMENT AND PLAN:  1. NICM: Resolved. Continue aggressive blood pressure control and current medications. 2. HTN: Currently well controlled. She is concerned that her blood pressure medicines are causing hair loss. I think it may be more hormonal related. Will check with pharmacy regarding her blood pressure meds as to whether these could be changed to lower the likelihood of hair loss. 3. SHOB: Encouraged regular exercise to improve her conditioning. Symptom has resolved. 4. Hyperlipidemia: Lipids from March 2018 were much improved. Continue atorvastatin. 5. In the past, we discussed possible pregnancy for her at length. I think she would be high risk for  peripartum cardiomyopathy. She is also concerned that due to her polycystic ovarian syndrome, she would not get pregnant. Her OB/GYN did not approve her getting pregnant. She is now comfortable with this.  Wil not adjust her BP meds.    She has made great improvements.  She remains compliant and eating Her PCOS may make it difficult to lose weight.  COntinue with current lifestyle modification. .        Current medicines are reviewed at length with the patient today.  The patient concerns regarding her medicines were addressed.  The following changes have been made:  No change  Labs/ tests ordered today include: lipids in a few months No orders of the defined types were placed  in this encounter.   Recommend 150 minutes/week of aerobic exercise Low fat, low carb, high fiber diet recommended  Disposition:   FU in 1 year   Signed, Larae Grooms, MD  11/07/2016 12:10 PM    [facility] [address], [place], [place]  [address] Phone: [phone]; Fax: [phone]

## 2016-11-07 NOTE — Patient Instructions (Signed)
Medication Instructions:  Your physician recommends that you continue on your current medications as directed. Please refer to the Current Medication list given to you today.   Labwork: None ordered  Testing/Procedures: None ordered  Follow-Up: Your physician wants you to follow-up in: 6 month with Dr. Irish Lack. You will receive a reminder letter in the mail two months in advance. If you don't receive a letter, please call our office to schedule the follow-up appointment.   Any Other Special Instructions Will Be Listed Below (If Applicable).     If you need a refill on your cardiac medications before your next appointment, please call your pharmacy.

## 2016-11-12 NOTE — Progress Notes (Signed)
If she wants to try carvedilol 6.25 mg BID, instead of metoprolol, was can see if this results in less hair loss.

## 2016-11-13 ENCOUNTER — Telehealth: Payer: Self-pay

## 2016-11-13 DIAGNOSIS — L659 Nonscarring hair loss, unspecified: Secondary | ICD-10-CM | POA: Diagnosis not present

## 2016-11-13 MED ORDER — CARVEDILOL 6.25 MG PO TABS: 6.2500 mg | ORAL_TABLET | Freq: Two times a day (BID) | ORAL | 3 refills | Status: DC

## 2016-11-13 NOTE — Telephone Encounter (Signed)
Left message for patient to call back  

## 2016-11-13 NOTE — Telephone Encounter (Signed)
Jettie Booze, MD at 11/07/2016 11:40 AM   Status: Signed    If she wants to try carvedilol 6.25 mg BID, instead of metoprolol, was can see if this results in less hair loss.

## 2016-11-13 NOTE — Telephone Encounter (Signed)
-----   Message from Jettie Booze, MD sent at 11/12/2016  5:16 PM EDT -----   ----- Message ----- From: Leeroy Bock, [facility] Sent: 11/08/2016   7:13 AM To: Jettie Booze, MD  Risk is about the same with beta blockers (< 1%), but still may be worth changing over to see if pt tolerates carvedilol better.   ----- Message ----- From: Jettie Booze, MD Sent: 11/07/2016   3:52 PM To: Megan E Supple, RPH  Would Coreg be less likely than metoprolol to cause hair loss?  JV ----- Message ----- From: Leeroy Bock, [facility] Sent: 11/07/2016   1:26 PM To: Jettie Booze, MD  Metoprolol can occasionally cause hair loss, this would be the most likely culprit if her hair loss is related to BP medication. Spironolactone can help with PCOS symptoms, however it mainly helps to prevent excessive hair growth on the face or body which PCOS patients tend to have rather than promoting hair growth on the scalp.  Megan   ----- Message ----- From: Jettie Booze, MD Sent: 11/07/2016  12:59 PM To: Leeroy Bock, RPH  Megan, Any thoughts on different antiHTN meds to reduce risk of hair loss.  If I had to guess, I think it is more likely related to her PCOS.

## 2016-11-13 NOTE — Telephone Encounter (Signed)
Patient made aware of Dr. Hassell Done recommendations to stop metoprolol and to start carvedilol 6.25 BID to see if that helps with her hair loss. Rx sent to patient's preferred pharmacy. Patient advised to monitor BP and HR with the change. Patient verbalized understanding and was in agreement with this plan.

## 2016-11-13 NOTE — Addendum Note (Signed)
Addended by: Drue Novel I on: 11/13/2016 10:16 AM   Modules accepted: Orders

## 2016-11-14 DIAGNOSIS — L658 Other specified nonscarring hair loss: Secondary | ICD-10-CM | POA: Diagnosis not present

## 2016-11-14 DIAGNOSIS — L821 Other seborrheic keratosis: Secondary | ICD-10-CM | POA: Diagnosis not present

## 2016-11-23 DIAGNOSIS — Z13220 Encounter for screening for lipoid disorders: Secondary | ICD-10-CM | POA: Diagnosis not present

## 2016-11-23 DIAGNOSIS — N926 Irregular menstruation, unspecified: Secondary | ICD-10-CM | POA: Diagnosis not present

## 2016-11-23 DIAGNOSIS — Z131 Encounter for screening for diabetes mellitus: Secondary | ICD-10-CM | POA: Diagnosis not present

## 2016-11-23 LAB — LIPID PANEL
CHOLESTEROL: 150 (ref 0–200)
HDL: 36 (ref 35–70)
LDL Cholesterol: 94
LDl/HDL Ratio: 4.2
TRIGLYCERIDES: 99 (ref 40–160)

## 2016-11-23 LAB — TSH: TSH: 1.94 (ref 0.41–5.90)

## 2016-11-23 LAB — BASIC METABOLIC PANEL: GLUCOSE: 93

## 2016-12-07 DIAGNOSIS — L659 Nonscarring hair loss, unspecified: Secondary | ICD-10-CM | POA: Diagnosis not present

## 2017-01-29 ENCOUNTER — Ambulatory Visit: Payer: 59 | Admitting: Nurse Practitioner

## 2017-01-30 ENCOUNTER — Ambulatory Visit (INDEPENDENT_AMBULATORY_CARE_PROVIDER_SITE_OTHER): Payer: 59 | Admitting: Nurse Practitioner

## 2017-01-30 ENCOUNTER — Encounter: Payer: Self-pay | Admitting: Nurse Practitioner

## 2017-01-30 VITALS — BP 160/90 | HR 76 | Temp 98.6°F | Ht 63.0 in | Wt 253.0 lb

## 2017-01-30 DIAGNOSIS — K5900 Constipation, unspecified: Secondary | ICD-10-CM | POA: Diagnosis not present

## 2017-01-30 DIAGNOSIS — K644 Residual hemorrhoidal skin tags: Secondary | ICD-10-CM | POA: Diagnosis not present

## 2017-01-30 MED ORDER — HYDROCORTISONE 2.5 % RE CREA: 1.0000 "application " | TOPICAL_CREAM | Freq: Two times a day (BID) | RECTAL | 0 refills | Status: DC

## 2017-01-30 MED ORDER — PSYLLIUM 28 % PO PACK: 1.0000 | PACK | Freq: Two times a day (BID) | ORAL | Status: DC

## 2017-01-30 NOTE — Progress Notes (Signed)
Subjective:  Patient ID: Tammy Hogan, female    DOB: 1976/05/05  Age: 41 y.o. MRN: [id number]  CC: GI Problem (constipation--getting worse 2 mo,bumps around butt area 2 mo, diarrhea 1 wk ago--burning,itching---stomach pain)   HPI [name] presents with rectal itching x 34months. She also complains of worsening constipation and diarrhea with increase iron supplement. No ABD pain, no N/V, no melena or hematochezia, no weight loss, no fatigue or weakness, no dizziness, no rectal pain, no mucus per rectum. She admits fo insufficient water intake and low fiber diet.   Outpatient Medications Prior to Visit  Medication Sig Dispense Refill  . amLODipine (NORVASC) 10 MG tablet Take 1 tablet (10 mg total) by mouth daily. 90 tablet 3  . aspirin 81 MG tablet Take 81 mg by mouth daily.    [name] buPROPion (WELLBUTRIN SR) 150 MG 12 hr tablet Take 1 tablet (150 mg total) by mouth 2 (two) times daily. 60 tablet 5  . carvedilol (COREG) 6.25 MG tablet Take 1 tablet (6.25 mg total) by mouth 2 (two) times daily. 180 tablet 3  . diphenhydramine-acetaminophen (TYLENOL PM) 25-500 MG TABS Take 1 tablet by mouth at bedtime as needed (for sleep).    . furosemide (LASIX) 20 MG tablet Take 1 tablet (20 mg total) by mouth daily. 90 tablet 3  . levonorgestrel (MIRENA) 20 MCG/24HR IUD 1 each by Intrauterine route once.    [name] lisinopril (PRINIVIL,ZESTRIL) 20 MG tablet Take 1 tablet (20 mg total) by mouth daily. 90 tablet 3  . nitroGLYCERIN (NITROSTAT) 0.4 MG SL tablet Place 1 tablet (0.4 mg total) under the tongue every 5 (five) minutes as needed for chest pain. 25 tablet 3  . omeprazole (PRILOSEC) 20 MG capsule Take 20 mg by mouth daily.    [name] atorvastatin (LIPITOR) 10 MG tablet Take 1 tablet (10 mg total) by mouth daily. 90 tablet 3   No facility-administered medications prior to visit.     ROS See HPI  Objective:  BP (!) 160/90   Pulse 76   Temp 98.6 F (37 C)   Ht 5\' 3"  (1.6 m)   Wt 253 lb (114.8 kg)    SpO2 98%   BMI 44.82 kg/m   BP Readings from Last 3 Encounters:  01/30/17 (!) 160/90  11/07/16 120/70  10/05/16 (!) 150/108    Wt Readings from Last 3 Encounters:  01/30/17 253 lb (114.8 kg)  11/07/16 259 lb 3.2 oz (117.6 kg)  10/05/16 260 lb (117.9 kg)    Physical Exam  Constitutional: She is oriented to person, place, and time. No distress.  Abdominal: Soft. There is no tenderness.  Genitourinary: Rectal exam shows external hemorrhoid. Rectal exam shows no fissure, no mass and no tenderness.  Musculoskeletal: Normal range of motion.  Neurological: She is alert and oriented to person, place, and time.  Skin: No rash noted.  Vitals reviewed.   Lab Results  Component Value Date   WBC 7.0 07/10/2013   HGB 12.5 07/10/2013   HCT 38.8 07/10/2013   PLT 459 (H) 07/10/2013   GLUCOSE 66 08/13/2016   CHOL 160 08/13/2016   TRIG 131 08/13/2016   HDL 37 (L) 08/13/2016   LDLCALC 97 08/13/2016   ALT 14 08/13/2016   AST 12 08/13/2016   NA 140 08/13/2016   K 4.5 08/13/2016   CL 101 08/13/2016   CREATININE 0.81 08/13/2016   BUN 9 08/13/2016   CO2 23 08/13/2016   TSH 3.182 07/01/2012   HGBA1C 5.4 07/03/2012  US Breast Ltd Uni Right Inc Axilla  Result Date: 06/18/2016 CLINICAL DATA:  Patient recalled from screening for right breast mass. EXAM: 2D DIGITAL DIAGNOSTIC RIGHT MAMMOGRAM WITH CAD AND ADJUNCT TOMO ULTRASOUND RIGHT BREAST COMPARISON:  None ACR Breast Density Category a: The breast tissue is almost entirely fatty. FINDINGS: Within the inferior slightly lateral right breast anterior depth there is a small 6 mm oval circumscribed low-attenuation mass. Mammographic images were processed with CAD. On physical exam, I palpate no discrete mass within the lower slightly lateral right breast anteriorly. Targeted ultrasound is performed, showing a 7 x 8 x 3 mm oval circumscribed hypoechoic mass within the right breast 7 o'clock position 2 cm from the nipple, potentially corresponding  with mammographic abnormality. IMPRESSION: Probably benign right breast mass 7 o'clock position, favored to represent a complicated cyst versus fibroadenoma. RECOMMENDATION: Right breast diagnostic mammography and ultrasound in 6 months to demonstrate stability of probably benign right breast mass. I have discussed the findings and recommendations with the patient. Results were also provided in writing at the conclusion of the visit. If applicable, a reminder letter will be sent to the patient regarding the next appointment. BI-RADS CATEGORY  3: Probably benign. Electronically Signed   By: Lovey Newcomer M.D.   On: 06/18/2016 09:26   Mm Diag Breast Tomo Uni Right  Result Date: 06/18/2016 CLINICAL DATA:  Patient recalled from screening for right breast mass. EXAM: 2D DIGITAL DIAGNOSTIC RIGHT MAMMOGRAM WITH CAD AND ADJUNCT TOMO ULTRASOUND RIGHT BREAST COMPARISON:  None ACR Breast Density Category a: The breast tissue is almost entirely fatty. FINDINGS: Within the inferior slightly lateral right breast anterior depth there is a small 6 mm oval circumscribed low-attenuation mass. Mammographic images were processed with CAD. On physical exam, I palpate no discrete mass within the lower slightly lateral right breast anteriorly. Targeted ultrasound is performed, showing a 7 x 8 x 3 mm oval circumscribed hypoechoic mass within the right breast 7 o'clock position 2 cm from the nipple, potentially corresponding with mammographic abnormality. IMPRESSION: Probably benign right breast mass 7 o'clock position, favored to represent a complicated cyst versus fibroadenoma. RECOMMENDATION: Right breast diagnostic mammography and ultrasound in 6 months to demonstrate stability of probably benign right breast mass. I have discussed the findings and recommendations with the patient. Results were also provided in writing at the conclusion of the visit. If applicable, a reminder letter will be sent to the patient regarding the next  appointment. BI-RADS CATEGORY  3: Probably benign. Electronically Signed   By: Lovey Newcomer M.D.   On: 06/18/2016 09:26    Assessment & Plan:   Tammy Hogan was seen today for gi problem.  Diagnoses and all orders for this visit:  Inflamed external hemorrhoid -     hydrocortisone (ANUSOL-HC) 2.5 % rectal cream; Place 1 application rectally 2 (two) times daily.  Constipation, unspecified constipation type -     psyllium (METAMUCIL SMOOTH TEXTURE) 28 % packet; Take 1 packet by mouth 2 (two) times daily.   I am having Tammy Hogan start on hydrocortisone and psyllium. I am also having her maintain her diphenhydramine-acetaminophen, aspirin, levonorgestrel, furosemide, lisinopril, nitroGLYCERIN, atorvastatin, amLODipine, buPROPion, omeprazole, and carvedilol.  Meds ordered this encounter  Medications  . hydrocortisone (ANUSOL-HC) 2.5 % rectal cream    Sig: Place 1 application rectally 2 (two) times daily.    Dispense:  30 g    Refill:  0    Order Specific Question:   Supervising Provider    Answer:  PLOTNIKOV, ALEKSEI V [[id number]]  . psyllium (METAMUCIL SMOOTH TEXTURE) 28 % packet    Sig: Take 1 packet by mouth 2 (two) times daily.    Order Specific Question:   Supervising Provider    Answer:   Cassandria Anger [[id number]]    Follow-up: Return if symptoms worsen or fail to improve.  Tammy Lacy, NP

## 2017-01-30 NOTE — Patient Instructions (Signed)
Hemorrhoids Hemorrhoids are swollen veins in and around the rectum or anus. There are two types of hemorrhoids:  Internal hemorrhoids. These occur in the veins that are just inside the rectum. They may poke through to the outside and become irritated and painful.  External hemorrhoids. These occur in the veins that are outside of the anus and can be felt as a painful swelling or hard lump near the anus.  Most hemorrhoids do not cause serious problems, and they can be managed with home treatments such as diet and lifestyle changes. If home treatments do not help your symptoms, procedures can be done to shrink or remove the hemorrhoids. What are the causes? This condition is caused by increased pressure in the anal area. This pressure may result from various things, including:  Constipation.  Straining to have a bowel movement.  Diarrhea.  Pregnancy.  Obesity.  Sitting for long periods of time.  Heavy lifting or other activity that causes you to strain.  Anal sex.  What are the signs or symptoms? Symptoms of this condition include:  Pain.  Anal itching or irritation.  Rectal bleeding.  Leakage of stool (feces).  Anal swelling.  One or more lumps around the anus.  How is this diagnosed? This condition can often be diagnosed through a visual exam. Other exams or tests may also be done, such as:  Examination of the rectal area with a gloved hand (digital rectal exam).  Examination of the anal canal using a small tube (anoscope).  A blood test, if you have lost a significant amount of blood.  A test to look inside the colon (sigmoidoscopy or colonoscopy).  How is this treated? This condition can usually be treated at home. However, various procedures may be done if dietary changes, lifestyle changes, and other home treatments do not help your symptoms. These procedures can help make the hemorrhoids smaller or remove them completely. Some of these procedures involve  surgery, and others do not. Common procedures include:  Rubber band ligation. Rubber bands are placed at the base of the hemorrhoids to cut off the blood supply to them.  Sclerotherapy. Medicine is injected into the hemorrhoids to shrink them.  Infrared coagulation. A type of light energy is used to get rid of the hemorrhoids.  Hemorrhoidectomy surgery. The hemorrhoids are surgically removed, and the veins that supply them are tied off.  Stapled hemorrhoidopexy surgery. A circular stapling device is used to remove the hemorrhoids and use staples to cut off the blood supply to them.  Follow these instructions at home: Eating and drinking  Eat foods that have a lot of fiber in them, such as whole grains, beans, nuts, fruits, and vegetables. Ask your health care provider about taking products that have added fiber (fiber supplements).  Drink enough fluid to keep your urine clear or pale yellow. Managing pain and swelling  Take warm sitz baths for 20 minutes, 3-4 times a day to ease pain and discomfort.  If directed, apply ice to the affected area. Using ice packs between sitz baths may be helpful. ? Put ice in a plastic bag. ? Place a towel between your skin and the bag. ? Leave the ice on for 20 minutes, 2-3 times a day. General instructions  Take over-the-counter and prescription medicines only as told by your health care provider.  Use medicated creams or suppositories as told.  Exercise regularly.  Go to the bathroom when you have the urge to have a bowel movement. Do not wait.    Avoid straining to have bowel movements.  Keep the anal area dry and clean. Use wet toilet paper or moist towelettes after a bowel movement.  Do not sit on the toilet for long periods of time. This increases blood pooling and pain. Contact a health care provider if:  You have increasing pain and swelling that are not controlled by treatment or medicine.  You have uncontrolled bleeding.  You  have difficulty having a bowel movement, or you are unable to have a bowel movement.  You have pain or inflammation outside the area of the hemorrhoids. This information is not intended to replace advice given to you by your health care provider. Make sure you discuss any questions you have with your health care provider. Document Released: 05/25/2000 Document Revised: 10/26/2015 Document Reviewed: 02/09/2015 Elsevier Interactive Patient Education  2017 [employer].   Constipation, Adult Constipation is when a person has fewer bowel movements in a week than normal, has difficulty having a bowel movement, or has stools that are dry, hard, or larger than normal. Constipation may be caused by an underlying condition. It may become worse with age if a person takes certain medicines and does not take in enough fluids. Follow these instructions at home: Eating and drinking   Eat foods that have a lot of fiber, such as fresh fruits and vegetables, whole grains, and beans.  Limit foods that are high in fat, low in fiber, or overly processed, such as french fries, hamburgers, cookies, candies, and soda.  Drink enough fluid to keep your urine clear or pale yellow. General instructions  Exercise regularly or as told by your health care provider.  Go to the restroom when you have the urge to go. Do not hold it in.  Take over-the-counter and prescription medicines only as told by your health care provider. These include any fiber supplements.  Practice pelvic floor retraining exercises, such as deep breathing while relaxing the lower abdomen and pelvic floor relaxation during bowel movements.  Watch your condition for any changes.  Keep all follow-up visits as told by your health care provider. This is important. Contact a health care provider if:  You have pain that gets worse.  You have a fever.  You do not have a bowel movement after 4 days.  You vomit.  You are not hungry.  You  lose weight.  You are bleeding from the anus.  You have thin, pencil-like stools. Get help right away if:  You have a fever and your symptoms suddenly get worse.  You leak stool or have blood in your stool.  Your abdomen is bloated.  You have severe pain in your abdomen.  You feel dizzy or you faint. This information is not intended to replace advice given to you by your health care provider. Make sure you discuss any questions you have with your health care provider. Document Released: 02/24/2004 Document Revised: 12/16/2015 Document Reviewed: 11/16/2015 Elsevier Interactive Patient Education  2017 [employer].

## 2017-01-31 ENCOUNTER — Encounter: Payer: Self-pay | Admitting: Nurse Practitioner

## 2017-01-31 LAB — TESTOSTERONE
FREE TESTOSTERONE: 2.6
Sex Hormone Binding Glob.: 27
TESTOSTERONE: 24

## 2017-01-31 LAB — FSH/LH: FSH: 9.7

## 2017-01-31 LAB — ESTRADIOL: Estradiol: 223

## 2017-01-31 LAB — PROLACTIN: Prolactin: 9.7

## 2017-01-31 LAB — DHEA-SULFATE: DHEA-SULFATE, SERUM: 183

## 2017-01-31 NOTE — Progress Notes (Signed)
Abstracted result and sent to scan  

## 2017-02-01 ENCOUNTER — Telehealth: Payer: Self-pay | Admitting: Nurse Practitioner

## 2017-02-01 NOTE — Telephone Encounter (Signed)
Pt called stating her condition has gotten worse, shes seeing more blood and the pain has increased  Please call back

## 2017-02-01 NOTE — Telephone Encounter (Signed)
Spoke with pt, she report of taking hydrocotison cream as directed but she notice more blood than 2 days ago and increase pain near rectal area. Please advise.

## 2017-02-04 NOTE — Telephone Encounter (Signed)
Pt report her symptoms is better. Direction of medication was not clear to the pt that's why medication didn't take full effect.

## 2017-02-04 NOTE — Telephone Encounter (Signed)
If has worsening rectal bleed she needs to go to ED for further evaluation.

## 2017-02-18 ENCOUNTER — Other Ambulatory Visit: Payer: Self-pay | Admitting: Interventional Cardiology

## 2017-02-26 DIAGNOSIS — K625 Hemorrhage of anus and rectum: Secondary | ICD-10-CM | POA: Diagnosis not present

## 2017-02-26 DIAGNOSIS — K602 Anal fissure, unspecified: Secondary | ICD-10-CM | POA: Diagnosis not present

## 2017-02-26 DIAGNOSIS — K6289 Other specified diseases of anus and rectum: Secondary | ICD-10-CM | POA: Diagnosis not present

## 2017-03-11 ENCOUNTER — Encounter: Payer: Self-pay | Admitting: Family Medicine

## 2017-03-11 ENCOUNTER — Ambulatory Visit (INDEPENDENT_AMBULATORY_CARE_PROVIDER_SITE_OTHER): Payer: 59 | Admitting: Family Medicine

## 2017-03-11 VITALS — BP 132/80 | HR 89 | Temp 98.3°F | Resp 17 | Ht 63.78 in | Wt 251.2 lb

## 2017-03-11 DIAGNOSIS — Z0000 Encounter for general adult medical examination without abnormal findings: Secondary | ICD-10-CM

## 2017-03-11 DIAGNOSIS — Z5181 Encounter for therapeutic drug level monitoring: Secondary | ICD-10-CM | POA: Diagnosis not present

## 2017-03-11 DIAGNOSIS — I10 Essential (primary) hypertension: Secondary | ICD-10-CM | POA: Diagnosis not present

## 2017-03-11 DIAGNOSIS — M62838 Other muscle spasm: Secondary | ICD-10-CM | POA: Diagnosis not present

## 2017-03-11 MED ORDER — CYCLOBENZAPRINE HCL 10 MG PO TABS: 5.0000 mg | ORAL_TABLET | Freq: Three times a day (TID) | ORAL | 0 refills | Status: DC | PRN

## 2017-03-11 NOTE — Progress Notes (Signed)
Chief Complaint  Patient presents with  . cpe    without pap    Subjective:  Tammy Hogan is a 41 y.o. female here for a health maintenance visit.  Patient is established pt  Patient Active Problem List   Diagnosis Date Noted  . Chemical dermatitis 03/15/2017  . Scalp pain 03/15/2017  . Itching 03/15/2017  . Hair loss 11/07/2016  . Gastroesophageal reflux disease 06/20/2016  . Mixed hyperlipidemia 05/30/2016  . Cervicalgia 05/21/2016  . Generalized anxiety disorder 05/15/2016  . Arthralgia of multiple joints 05/15/2016  . BMI 45.0-49.9, adult ([place]) 05/15/2016  . Essential hypertension 09/07/2014  . Bimalleolar fracture of left ankle 07/10/2013  . Cardiomyopathy ([place]) 07/02/2012  . Systolic heart failure ([place]) 07/02/2012  . HTN (hypertension), malignant 07/01/2012  . Pulmonary edema 07/01/2012  . Back pain, acute 07/01/2012  . Medically noncompliant 07/01/2012  . Tobacco use disorder 07/01/2012    Past Medical History:  Diagnosis Date  . Anginal pain ([place])   . [place], [place]  . Depression   . GERD (gastroesophageal reflux disease)   . History of cardiac cath 07/23/2012   Dr Irish Lack  . History of echocardiogram    Cardiomyopathy-reduced EF 35-40% with wall m otion abnormality concerning for ischemia. Hypokinesis of the anterior and anteroseptal walls to the apex  . HPV (human papilloma virus) infection   . Hyperlipidemia   . Hypertension   . PCOS (polycystic ovarian syndrome)   . PPD positive, treated 2008   [place], [place]    Past Surgical History:  Procedure Laterality Date  . CARDIAC CATHETERIZATION  feb, 2014   Dr Irish Lack  . OPEN REDUCTION INTERNAL FIXATION (ORIF) LEFT BIMALLEOLAR ANKLE FRACTURE Left 07/10/2013   Performed by Mcarthur Rossetti, MD at [facility] ORS     Outpatient Medications Prior to Visit  Medication Sig Dispense Refill  . amLODipine (NORVASC) 10 MG tablet Take 1 tablet (10 mg total) by mouth daily. 90 tablet 3  . aspirin 81  MG tablet Take 81 mg by mouth daily.    [name] atorvastatin (LIPITOR) 10 MG tablet TAKE 1 TABLET BY MOUTH  DAILY 90 tablet 2  . buPROPion (WELLBUTRIN SR) 150 MG 12 hr tablet Take 1 tablet (150 mg total) by mouth 2 (two) times daily. 60 tablet 5  . carvedilol (COREG) 6.25 MG tablet Take 1 tablet (6.25 mg total) by mouth 2 (two) times daily. 180 tablet 3  . furosemide (LASIX) 20 MG tablet Take 1 tablet (20 mg total) by mouth daily. 90 tablet 3  . levonorgestrel (MIRENA) 20 MCG/24HR IUD 1 each by Intrauterine route once.    [name] lisinopril (PRINIVIL,ZESTRIL) 20 MG tablet Take 1 tablet (20 mg total) by mouth daily. 90 tablet 3  . nitroGLYCERIN (NITROSTAT) 0.4 MG SL tablet Place 1 tablet (0.4 mg total) under the tongue every 5 (five) minutes as needed for chest pain. 25 tablet 3  . psyllium (METAMUCIL SMOOTH TEXTURE) 28 % packet Take 1 packet by mouth 2 (two) times daily.    . diphenhydramine-acetaminophen (TYLENOL PM) 25-500 MG TABS Take 1 tablet by mouth at bedtime as needed (for sleep).    [name] omeprazole (PRILOSEC) 20 MG capsule Take 20 mg by mouth daily.    . hydrocortisone (ANUSOL-HC) 2.5 % rectal cream Place 1 application rectally 2 (two) times daily. (Patient not taking: Reported on 03/11/2017) 30 g 0   No facility-administered medications prior to visit.     Allergies  Allergen Reactions  .  Percocet [Oxycodone-Acetaminophen] Nausea Only     Family History  Problem Relation Age of Onset  . Hypertension Mother   . Hyperlipidemia Mother   . Heart failure Paternal Grandmother   . Hypertension Paternal Grandmother   . Diabetes Paternal Grandmother   . Glaucoma Paternal Grandmother   . Other Father        drug overdose  . Drug abuse Father        overdose  . Hypertension Sister   . Cancer Maternal Grandmother   . Cirrhosis Paternal Grandfather   . Alcohol abuse Paternal Grandfather   . Cancer Other 78       breast, cervical and colon cancer     Health Habits: Dental Exam: up to  date Eye Exam: up to date Exercise: 0 times/week on average Current exercise activities: none Diet: eats at home and eats out, alternates binging vs. Anorexic patterns  Social History   Socioeconomic History  . Marital status: Single    Spouse name: Not on file  . Number of children: Not on file  . Years of education: Not on file  . Highest education level: Not on file  Social Needs  . Financial resource strain: Not on file  . Food insecurity - worry: Not on file  . Food insecurity - inability: Not on file  . Transportation needs - medical: Not on file  . Transportation needs - non-medical: Not on file  Occupational History  . Not on file  Tobacco Use  . Smoking status: Current Every Day Smoker    Packs/day: 0.50    Years: 16.00    Pack years: 8.00  . Smokeless tobacco: Never Used  Substance and Sexual Activity  . Alcohol use: No    Alcohol/week: 0.0 oz  . Drug use: No  . Sexual activity: Yes    Birth control/protection: IUD  Other Topics Concern  . Not on file  Social History Narrative  . Not on file   Social History   Substance and Sexual Activity  Alcohol Use No  . Alcohol/week: 0.0 oz   Social History   Tobacco Use  Smoking Status Current Every Day Smoker  . Packs/day: 0.50  . Years: 16.00  . Pack years: 8.00  Smokeless Tobacco Never Used   Social History   Substance and Sexual Activity  Drug Use No    GYN: Sexual Health Menstrual status: regular menses LMP: No LMP recorded. Patient is not currently having periods (Reason: IUD). Last pap smear: see HM section History of abnormal pap smears:  Sexually active: with female partner Current contraception: IUD  Health Maintenance: See under health Maintenance activity for review of completion dates as well. Immunization History  Administered Date(s) Administered  . Influenza,inj,Quad PF,6+ Mos 07/11/2013  . Influenza-Unspecified 03/11/2016  . Pneumococcal Polysaccharide-23 07/11/2013  . Tdap  07/12/2013      Depression Screen-PHQ2/9 Depression screen [facility] 2/9 04/04/2017 03/25/2017 03/15/2017 03/11/2017 06/20/2016  Decreased Interest 0 0 0 0 1  Down, Depressed, Hopeless 0 0 0 0 3  PHQ - 2 Score 0 0 0 0 4  Altered sleeping - - - - 3  Tired, decreased energy - - - - 0  Change in appetite - - - - 3  Feeling bad or failure about yourself  - - - - 3  Trouble concentrating - - - - 2  Moving slowly or fidgety/restless - - - - 3  Suicidal thoughts - - - - 0  PHQ-9 Score - - - -  18  Difficult doing work/chores - - - - Not difficult at all       Depression Severity and Treatment Recommendations:  0-4= None  5-9= Mild / Treatment: Support, educate to call if worse; return in one month  10-14= Moderate / Treatment: Support, watchful waiting; Antidepressant or Psycotherapy  15-19= Moderately severe / Treatment: Antidepressant OR Psychotherapy  >= 20 = Major depression, severe / Antidepressant AND Psychotherapy    Review of Systems   Review of Systems  Constitutional: Negative for chills, fever and weight loss.  HENT: Negative for ear discharge and ear pain.   Eyes: Negative for blurred vision and double vision.  Respiratory: Positive for cough. Negative for sputum production, shortness of breath and wheezing.   Cardiovascular: Negative for chest pain and palpitations.  Gastrointestinal: Negative for abdominal pain, nausea and vomiting.  Genitourinary: Negative for dysuria, frequency and urgency.  Musculoskeletal: Negative for back pain and neck pain.  Skin: Negative for itching and rash.  Neurological: Negative for dizziness, tingling and headaches.  Psychiatric/Behavioral: Negative for depression. The patient is not nervous/anxious.     See HPI for ROS as well.    Objective:   Vitals:   03/11/17 0819  BP: 132/80  Pulse: 89  Resp: 17  Temp: 98.3 F (36.8 C)  TempSrc: Oral  SpO2: 96%  Weight: 251 lb 3.2 oz (113.9 kg)  Height: 5' 3.78" (1.62 m)    Body mass  index is 43.42 kg/m.  Physical Exam  Constitutional: She is oriented to person, place, and time. She appears well-developed and well-nourished.  HENT:  Head: Normocephalic and atraumatic.  Right Ear: External ear normal.  Left Ear: External ear normal.  Nose: Nose normal.  Mouth/Throat: Oropharynx is clear and moist.  Eyes: Conjunctivae and EOM are normal.  Neck: Normal range of motion. No thyromegaly present.  Cardiovascular: Normal rate, regular rhythm and normal heart sounds.   No murmur heard. Pulmonary/Chest: Effort normal and breath sounds normal. No respiratory distress. She has no wheezes.  Abdominal: Soft. Bowel sounds are normal. She exhibits no distension. There is no tenderness.  Musculoskeletal: Normal range of motion. She exhibits no edema.  Neurological: She is alert and oriented to person, place, and time. She has normal reflexes. No cranial nerve deficit.  Skin: Skin is warm. No rash noted. No erythema.  Psychiatric: She has a normal mood and affect. Her behavior is normal. Judgment and thought content normal.       Assessment/Plan:   Patient was seen for a health maintenance exam.  Counseled the patient on health maintenance issues. Reviewed her health mainteance schedule and ordered appropriate tests (see orders.) Counseled on regular exercise and weight management. Recommend regular eye exams and dental cleaning.   The following issues were addressed today for health maintenance:   Kyri was seen today for cpe.  Diagnoses and all orders for this visit:  Encounter for health maintenance examination in adult- reviewed age appropriate screenings  Essential hypertension- bp at goal, cpm -     Comprehensive metabolic panel -     Lipid panel -     Hemoglobin A1c  Encounter for medication monitoring -     Comprehensive metabolic panel -     Lipid panel -     Hemoglobin A1c -     CBC -     TSH  Morbid obesity ([place])- discussed healthy bmi and weight  loss strategies -     Comprehensive metabolic panel -  Hemoglobin A1c -     TSH  Neck muscle spasm Advised trial of flexeril   No Follow-up on file.    Body mass index is 43.42 kg/m.:  Discussed the patient's BMI with patient. The BMI body mass index is 43.42 kg/m.     No future appointments.  Patient Instructions    Aspercreme with Lidocaine   IF you received an x-ray today, you will receive an invoice from [facility]. Please contact [facility] at [phone] with questions or concerns regarding your invoice.   IF you received labwork today, you will receive an invoice from [place]. Please contact [facility] at [phone] with questions or concerns regarding your invoice.   Our billing staff will not be able to assist you with questions regarding bills from these companies.  You will be contacted with the lab results as soon as they are available. The fastest way to get your results is to activate your My Chart account. Instructions are located on the last page of this paperwork. If you have not heard from [place] regarding the results in 2 weeks, please contact this office.     Dyspareunia, Female Dyspareunia is pain that is associated with sexual activity. This can affect any part of the genitals or lower abdomen, and there are many possible causes. This condition ranges from mild to severe. Depending on the cause, dyspareunia may get better with treatment, or it may return (recur) over time. What are the causes? The cause of this condition is not always known. Possible causes include:  Cancer.  Psychological factors, such as depression, anxiety, or previous traumatic experiences.  Severe pain and tenderness of the skin around the vagina (vulva) when it is touched (vulvar vestibulitis syndrome).  Infection of the pelvis or the vulva.  Infection of the vagina.  Painful, involuntary tightening (contraction) of the vaginal muscles when anything  is put inside the vagina (vaginismus).  Allergic reaction.  Ovarian cysts.  Solid growths of tissue (tumors) in the ovaries or the uterus.  Scar tissue in the ovaries, vagina, or pelvis.  Vaginal dryness.  Thinning of the tissue (atrophy) of the vulva and vagina.  Skin conditions that affect the vulva (vulvar dermatoses), such as lichen sclerosus or lichen planus.  Endometriosis.  Tubal pregnancy.  A tilted uterus.  Uterine prolapse.  Adhesions in the vagina.  Bladder problems.  Intestinal problems.  Certain medicines.  Medical conditions such as diabetes, arthritis, or thyroid disease.  What increases the risk? The following factors may make you more likely to develop this condition:  Having experienced physical or sexual trauma.  Having given birth more than once.  Taking birth control pills.  Having gone through menopause.  Having recently given birth, typically within the past 3-6 months.  Breastfeeding.  What are the signs or symptoms? The main symptom of this condition is pain in any part of the genitals or lower abdomen during or after sexual activity. This may include pain during sexual arousal, genital stimulation, or orgasm. Pain may get worse when anything is inserted into the vagina, or when the genitals are touched in any way, such as when sitting or wearing pants. Pain can range from mild to severe, depending on the cause of the condition. In some cases, symptoms go away with treatment and return (recur) at a later date. How is this diagnosed? This condition may be diagnosed based on:  Your symptoms, including: ? Where your pain is located. ? When your pain occurs.  Your medical history.  A physical exam. This may include a pelvic exam and a Pap test. This is a screening test that is used to check for signs of cancer of the vagina, cervix, and uterus.  Tests, including: ? Blood tests. ? Ultrasound. This uses sound waves to make a picture  of the area that is being tested. ? Urine culture. This test involves checking a urine sample for signs of infection. ? Culture test. This is when your health care provider uses a swab to collect a sample of vaginal fluid. The sample is checked for signs of infection. ? X-rays. ? MRI. ? CT scan. ? Laparoscopy. This is a procedure in which a small incision is made in your lower abdomen and a lighted, pencil-sized instrument (laparoscope) is passed through the incision and used to look inside your pelvis.  You may be referred to a health care provider who specializes in women's health (gynecologist). In some cases, diagnosing the cause of dyspareunia can be difficult. How is this treated? Treatment depends on the cause of your condition and your symptoms. In most cases, you may need to stop sexual activity until your symptoms improve. Treatment may include:  Lubricants.  Kegel exercises or vaginal dilators.  Medicated skin creams.  Medicated vaginal creams.  Hormonal therapy.  Antibiotic medicine to prevent or fight infection.  Medicines that help to relieve pain.  Medicines that treat depression (antidepressants).  Psychological counseling.  Sex therapy.  Surgery.  Follow these instructions at home: Lifestyle  Avoid tight clothing and irritating materials around your genital and abdominal area.  Use water-based lubricants as needed. Avoid oil-based lubricants.  Do not use any products that irritate you. This may include certain condoms, spermicides, lubricants, soaps, tampons, vaginal sprays, or douches.  Always practice safe sex. Talk with your health care provider about which form of birth control (contraception) is best for you.  Maintain open communication with your sexual partner. General instructions  Take over-the-counter and prescription medicines only as told by your health care provider.  If you had tests done, it is your responsibility to get your tests  results. Ask your health care provider or the department performing the test when your results will be ready.  Urinate before you engage in sexual activity.  Consider joining a support group.  Keep all follow-up visits as told by your health care provider. This is important. Contact a health care provider if:  You develop vaginal bleeding after sexual intercourse.  You develop a lump at the opening of your vagina. Seek medical care even if the lump is painless.  You have: ? Abnormal vaginal discharge. ? Vaginal dryness. ? Itchiness or irritation of your vulva or vagina. ? A new rash. ? Symptoms that get worse or do not improve with treatment. ? A fever. ? Pain when you urinate. ? Blood in your urine. Get help right away if:  You develop severe pain in your abdomen during or shortly after sexual intercourse.  You pass out after having sexual intercourse. This information is not intended to replace advice given to you by your health care provider. Make sure you discuss any questions you have with your health care provider. Document Released: 06/17/2007 Document Revised: 10/07/2015 Document Reviewed: 12/28/2014 Elsevier Interactive Patient Education  [employer].

## 2017-03-11 NOTE — Patient Instructions (Addendum)
Aspercreme with Lidocaine   IF you received an x-ray today, you will receive an invoice from [facility]. Please contact [facility] at [phone] with questions or concerns regarding your invoice.   IF you received labwork today, you will receive an invoice from [place]. Please contact [facility] at [phone] with questions or concerns regarding your invoice.   Our billing staff will not be able to assist you with questions regarding bills from these companies.  You will be contacted with the lab results as soon as they are available. The fastest way to get your results is to activate your My Chart account. Instructions are located on the last page of this paperwork. If you have not heard from [place] regarding the results in 2 weeks, please contact this office.     Dyspareunia, Female Dyspareunia is pain that is associated with sexual activity. This can affect any part of the genitals or lower abdomen, and there are many possible causes. This condition ranges from mild to severe. Depending on the cause, dyspareunia may get better with treatment, or it may return (recur) over time. What are the causes? The cause of this condition is not always known. Possible causes include:  Cancer.  Psychological factors, such as depression, anxiety, or previous traumatic experiences.  Severe pain and tenderness of the skin around the vagina (vulva) when it is touched (vulvar vestibulitis syndrome).  Infection of the pelvis or the vulva.  Infection of the vagina.  Painful, involuntary tightening (contraction) of the vaginal muscles when anything is put inside the vagina (vaginismus).  Allergic reaction.  Ovarian cysts.  Solid growths of tissue (tumors) in the ovaries or the uterus.  Scar tissue in the ovaries, vagina, or pelvis.  Vaginal dryness.  Thinning of the tissue (atrophy) of the vulva and vagina.  Skin conditions that affect the vulva (vulvar dermatoses), such  as lichen sclerosus or lichen planus.  Endometriosis.  Tubal pregnancy.  A tilted uterus.  Uterine prolapse.  Adhesions in the vagina.  Bladder problems.  Intestinal problems.  Certain medicines.  Medical conditions such as diabetes, arthritis, or thyroid disease.  What increases the risk? The following factors may make you more likely to develop this condition:  Having experienced physical or sexual trauma.  Having given birth more than once.  Taking birth control pills.  Having gone through menopause.  Having recently given birth, typically within the past 3-6 months.  Breastfeeding.  What are the signs or symptoms? The main symptom of this condition is pain in any part of the genitals or lower abdomen during or after sexual activity. This may include pain during sexual arousal, genital stimulation, or orgasm. Pain may get worse when anything is inserted into the vagina, or when the genitals are touched in any way, such as when sitting or wearing pants. Pain can range from mild to severe, depending on the cause of the condition. In some cases, symptoms go away with treatment and return (recur) at a later date. How is this diagnosed? This condition may be diagnosed based on:  Your symptoms, including: ? Where your pain is located. ? When your pain occurs.  Your medical history.  A physical exam. This may include a pelvic exam and a Pap test. This is a screening test that is used to check for signs of cancer of the vagina, cervix, and uterus.  Tests, including: ? Blood tests. ? Ultrasound. This uses sound waves to make a picture of the area that is being tested. ? Urine  culture. This test involves checking a urine sample for signs of infection. ? Culture test. This is when your health care provider uses a swab to collect a sample of vaginal fluid. The sample is checked for signs of infection. ? X-rays. ? MRI. ? CT scan. ? Laparoscopy. This is a procedure in  which a small incision is made in your lower abdomen and a lighted, pencil-sized instrument (laparoscope) is passed through the incision and used to look inside your pelvis.  You may be referred to a health care provider who specializes in women's health (gynecologist). In some cases, diagnosing the cause of dyspareunia can be difficult. How is this treated? Treatment depends on the cause of your condition and your symptoms. In most cases, you may need to stop sexual activity until your symptoms improve. Treatment may include:  Lubricants.  Kegel exercises or vaginal dilators.  Medicated skin creams.  Medicated vaginal creams.  Hormonal therapy.  Antibiotic medicine to prevent or fight infection.  Medicines that help to relieve pain.  Medicines that treat depression (antidepressants).  Psychological counseling.  Sex therapy.  Surgery.  Follow these instructions at home: Lifestyle  Avoid tight clothing and irritating materials around your genital and abdominal area.  Use water-based lubricants as needed. Avoid oil-based lubricants.  Do not use any products that irritate you. This may include certain condoms, spermicides, lubricants, soaps, tampons, vaginal sprays, or douches.  Always practice safe sex. Talk with your health care provider about which form of birth control (contraception) is best for you.  Maintain open communication with your sexual partner. General instructions  Take over-the-counter and prescription medicines only as told by your health care provider.  If you had tests done, it is your responsibility to get your tests results. Ask your health care provider or the department performing the test when your results will be ready.  Urinate before you engage in sexual activity.  Consider joining a support group.  Keep all follow-up visits as told by your health care provider. This is important. Contact a health care provider if:  You develop vaginal  bleeding after sexual intercourse.  You develop a lump at the opening of your vagina. Seek medical care even if the lump is painless.  You have: ? Abnormal vaginal discharge. ? Vaginal dryness. ? Itchiness or irritation of your vulva or vagina. ? A new rash. ? Symptoms that get worse or do not improve with treatment. ? A fever. ? Pain when you urinate. ? Blood in your urine. Get help right away if:  You develop severe pain in your abdomen during or shortly after sexual intercourse.  You pass out after having sexual intercourse. This information is not intended to replace advice given to you by your health care provider. Make sure you discuss any questions you have with your health care provider. Document Released: 06/17/2007 Document Revised: 10/07/2015 Document Reviewed: 12/28/2014 Elsevier Interactive Patient Education  [employer].

## 2017-03-12 LAB — COMPREHENSIVE METABOLIC PANEL
A/G RATIO: 1.2 (ref 1.2–2.2)
ALK PHOS: 97 IU/L (ref 39–117)
ALT: 13 IU/L (ref 0–32)
AST: 16 IU/L (ref 0–40)
Albumin: 3.9 g/dL (ref 3.5–5.5)
BUN/Creatinine Ratio: 13 (ref 9–23)
BUN: 9 mg/dL (ref 6–24)
Bilirubin Total: 0.2 mg/dL (ref 0.0–1.2)
CHLORIDE: 101 mmol/L (ref 96–106)
CO2: 25 mmol/L (ref 20–29)
Calcium: 9.1 mg/dL (ref 8.7–10.2)
Creatinine, Ser: 0.67 mg/dL (ref 0.57–1.00)
GFR calc Af Amer: 125 mL/min/{1.73_m2} (ref >59)
GFR calc non Af Amer: 109 mL/min/{1.73_m2} (ref >59)
GLOBULIN, TOTAL: 3.2 g/dL (ref 1.5–4.5)
Glucose: 78 mg/dL (ref 65–99)
POTASSIUM: 4 mmol/L (ref 3.5–5.2)
SODIUM: 140 mmol/L (ref 134–144)
Total Protein: 7.1 g/dL (ref 6.0–8.5)

## 2017-03-12 LAB — CBC
HEMATOCRIT: 38.9 % (ref 34.0–46.6)
HEMOGLOBIN: 12.3 g/dL (ref 11.1–15.9)
MCH: 28.5 pg (ref 26.6–33.0)
MCHC: 31.6 g/dL (ref 31.5–35.7)
MCV: 90 fL (ref 79–97)
PLATELETS: 431 10*3/uL — AB (ref 150–379)
RBC: 4.32 x10E6/uL (ref 3.77–5.28)
RDW: 14.8 % (ref 12.3–15.4)
WBC: 8.1 10*3/uL (ref 3.4–10.8)

## 2017-03-12 LAB — LIPID PANEL
CHOLESTEROL TOTAL: 150 mg/dL (ref 100–199)
Chol/HDL Ratio: 3.8 ratio (ref 0.0–4.4)
HDL: 39 mg/dL — ABNORMAL LOW (ref >39)
LDL Calculated: 82 mg/dL (ref 0–99)
TRIGLYCERIDES: 146 mg/dL (ref 0–149)
VLDL Cholesterol Cal: 29 mg/dL (ref 5–40)

## 2017-03-12 LAB — HEMOGLOBIN A1C
Est. average glucose Bld gHb Est-mCnc: 100 mg/dL
Hgb A1c MFr Bld: 5.1 % (ref 4.8–5.6)

## 2017-03-12 LAB — TSH: TSH: 1.26 u[IU]/mL (ref 0.450–4.500)

## 2017-03-15 ENCOUNTER — Ambulatory Visit: Payer: 59 | Admitting: Urgent Care

## 2017-03-15 ENCOUNTER — Encounter: Payer: Self-pay | Admitting: Emergency Medicine

## 2017-03-15 ENCOUNTER — Ambulatory Visit (INDEPENDENT_AMBULATORY_CARE_PROVIDER_SITE_OTHER): Payer: 59 | Admitting: Emergency Medicine

## 2017-03-15 VITALS — BP 148/94 | HR 92 | Temp 98.6°F | Resp 17 | Ht 64.5 in | Wt 255.0 lb

## 2017-03-15 DIAGNOSIS — L253 Unspecified contact dermatitis due to other chemical products: Secondary | ICD-10-CM

## 2017-03-15 DIAGNOSIS — R519 Headache, unspecified: Secondary | ICD-10-CM | POA: Insufficient documentation

## 2017-03-15 DIAGNOSIS — L299 Pruritus, unspecified: Secondary | ICD-10-CM | POA: Diagnosis not present

## 2017-03-15 DIAGNOSIS — R51 Headache: Secondary | ICD-10-CM | POA: Diagnosis not present

## 2017-03-15 MED ORDER — CEFADROXIL 500 MG PO CAPS: 500.0000 mg | ORAL_CAPSULE | Freq: Two times a day (BID) | ORAL | 0 refills | Status: AC

## 2017-03-15 MED ORDER — PREDNISONE 20 MG PO TABS: 40.0000 mg | ORAL_TABLET | Freq: Every day | ORAL | 0 refills | Status: AC

## 2017-03-15 NOTE — Patient Instructions (Addendum)
IF you received an x-ray today, you will receive an invoice from [facility]. Please contact [facility] at [phone] with questions or concerns regarding your invoice.   IF you received labwork today, you will receive an invoice from [place]. Please contact [facility] at [phone] with questions or concerns regarding your invoice.   Our billing staff will not be able to assist you with questions regarding bills from these companies.  You will be contacted with the lab results as soon as they are available. The fastest way to get your results is to activate your My Chart account. Instructions are located on the last page of this paperwork. If you have not heard from [place] regarding the results in 2 weeks, please contact this office.      Chemical Burn A chemical skin burn is an injury to your skin. It can also injure the structures below your skin. This type of burn is caused by coming into contact with a chemical that can damage and kill tissue (caustic chemical). A chemical burn can be more serious than other burns. This is because the chemicals are typically on your skin for a longer time. In some cases, some chemicals continue to cause damage even after they have been removed from the skin. Follow these instructions at home: Medicines  Take and apply over-the-counter and prescription medicines only as told by your doctor.  If you were prescribed antibiotic medicine, take or apply it as told by your doctor. Do not stop using the antibiotic even if your condition gets better. Burn care  Follow instructions from your doctor about: ? How to clean your burn. ? How to take care of your burn. ? When and how you should remove your bandage (dressing).  Check your burn every day for signs of infection. Check for: ? More redness, swelling, or pain. ? Fluid or blood. ? Warmth. ? Pus or a bad smell.  Keep the bandage dry until your doctor says it can be taken off.  Do not take baths, swim, use a hot tub, or do anything that would put your burn underwater until your doctor says it is okay. Activity  Do any range-of-motion movements as told by your physical therapist, if this applies.  Rest as told by your doctor. Do not exercise until your doctor says it is okay. General instructions  Raise (elevate) the injured area above the level of your heart while you are sitting or lying down.  Do not scratch or pick at the burn.  Do not break any blisters you may have. Do not peel any skin.  Protect your burn from the sun.  Drink enough fluid to keep your pee (urine) clear or pale yellow.  Keep all follow-up visits as told by your doctor. This is important. How is this prevented?  Try to avoid being around chemical that can cause burns.  Wear protective gloves and equipment when you touch dangerous chemicals.  Make sure all dangerous chemicals are labeled.  Where you work, ask about which dangerous chemicals they use. Ask if a safer chemical can be used instead.  Make sure there is enough clean air in any place that has dangerous chemicals.  Keep your skin clean and moist (moisturized). Dry skin is more likely to be hurt by chemicals. Contact a doctor if:  You got a tetanus shot and you have any of these problems in the area of the shot: ? Swelling. ? Very bad pain. ? Redness. ? Bleeding.  Your symptoms do not get better with treatment.  Medicine does not help your pain.  You have more redness, swelling, or pain around your burn.  Your burn feels warm to the touch.  Your burn looks different, or it has black or red spots. Get help right away if:  You have red streaks near the burn.  You have fluid, blood, or pus coming from the burn.  You have very bad swelling.  You have very bad pain.  There is a bad smell coming from your burn.  You have a fever.  You have numbness or tingling from the burned area or further down your legs  or arms.  You have trouble breathing.  You are coughing or wheezing.  You have chest pain. This information is not intended to replace advice given to you by your health care provider. Make sure you discuss any questions you have with your health care provider. Document Released: 07/05/2004 Document Revised: 01/20/2016 Document Reviewed: 01/20/2016 Elsevier Interactive Patient Education  2017 [employer].

## 2017-03-15 NOTE — Progress Notes (Signed)
Tammy Hogan 41 y.o.   Chief Complaint  Patient presents with  . rash on head and lump in neck    HISTORY OF PRESENT ILLNESS: This is a 41 y.o. female complaining of scalp redness, itching, and tenderness with swollen neck lymph nodes x 5-6 days since coloring hair.  HPI   Prior to Admission medications   Medication Sig Start Date End Date Taking? Authorizing Provider  amLODipine (NORVASC) 10 MG tablet Take 1 tablet (10 mg total) by mouth daily. 05/30/16  Yes Jettie Booze, MD  aspirin 81 MG tablet Take 81 mg by mouth daily.   Yes [provider]  atorvastatin (LIPITOR) 10 MG tablet TAKE 1 TABLET BY MOUTH  DAILY 02/20/17  Yes Jettie Booze, MD  buPROPion [facility] SR) 150 MG 12 hr tablet Take 1 tablet (150 mg total) by mouth 2 (two) times daily. 10/05/16  Yes Nche, Charlene Brooke, NP  carvedilol (COREG) 6.25 MG tablet Take 1 tablet (6.25 mg total) by mouth 2 (two) times daily. 11/13/16  Yes Jettie Booze, MD  cyclobenzaprine (FLEXERIL) 10 MG tablet Take 0.5 tablets (5 mg total) by mouth 3 (three) times daily as needed for muscle spasms. 03/11/17  Yes Stallings, Zoe A, MD  diphenhydramine-acetaminophen (TYLENOL PM) 25-500 MG TABS Take 1 tablet by mouth at bedtime as needed (for sleep).   Yes [provider]  furosemide (LASIX) 20 MG tablet Take 1 tablet (20 mg total) by mouth daily. 05/30/16  Yes Jettie Booze, MD  levonorgestrel (MIRENA) 20 MCG/24HR IUD 1 each by Intrauterine route once.   Yes [provider]  lisinopril (PRINIVIL,ZESTRIL) 20 MG tablet Take 1 tablet (20 mg total) by mouth daily. 05/30/16  Yes Jettie Booze, MD  nitroGLYCERIN (NITROSTAT) 0.4 MG SL tablet Place 1 tablet (0.4 mg total) under the tongue every 5 (five) minutes as needed for chest pain. 05/30/16  Yes Jettie Booze, MD  omeprazole (PRILOSEC) 20 MG capsule Take 20 mg by mouth daily. 10/16/16  Yes [provider]  psyllium (METAMUCIL  SMOOTH TEXTURE) 28 % packet Take 1 packet by mouth 2 (two) times daily. 01/30/17  Yes Nche, Charlene Brooke, NP    Allergies  Allergen Reactions  . Percocet [Oxycodone-Acetaminophen] Nausea Only    Patient Active Problem List   Diagnosis Date Noted  . Hair loss 11/07/2016  . Gastroesophageal reflux disease 06/20/2016  . Mixed hyperlipidemia 05/30/2016  . Cervicalgia 05/21/2016  . Generalized anxiety disorder 05/15/2016  . Arthralgia of multiple joints 05/15/2016  . BMI 45.0-49.9, adult ([place]) 05/15/2016  . Essential hypertension 09/07/2014  . Bimalleolar fracture of left ankle 07/10/2013  . Cardiomyopathy ([place]) 07/02/2012  . Systolic heart failure ([place]) 07/02/2012  . HTN (hypertension), malignant 07/01/2012  . Pulmonary edema 07/01/2012  . Back pain, acute 07/01/2012  . Medically noncompliant 07/01/2012  . Tobacco use disorder 07/01/2012    Past Medical History:  Diagnosis Date  . Anginal pain ([place])   . [place], [place]  . Depression   . GERD (gastroesophageal reflux disease)   . History of cardiac cath 07/23/2012   Dr Irish Lack  . History of echocardiogram    Cardiomyopathy-reduced EF 35-40% with wall m otion abnormality concerning for ischemia. Hypokinesis of the anterior and anteroseptal walls to the apex  . HPV (human papilloma virus) infection   . Hyperlipidemia   . Hypertension   . PCOS (polycystic ovarian syndrome)   . PPD positive, treated 2008   [place]    Past Surgical History:  Procedure Laterality Date  . CARDIAC CATHETERIZATION  feb, 2014   Dr Irish Lack  . ORIF ANKLE FRACTURE Left 07/10/2013   Procedure: OPEN REDUCTION INTERNAL FIXATION (ORIF) LEFT BIMALLEOLAR ANKLE FRACTURE;  Surgeon: Mcarthur Rossetti, MD;  Location: [facility];  Service: Orthopedics;  Laterality: Left;    Social History   Social History  . Marital status: Single    Spouse name: N/A  . Number of children: N/A  . Years of education: N/A   Occupational History  . Not  on file.   Social History Main Topics  . Smoking status: Current Every Day Smoker    Packs/day: 0.50    Years: 16.00  . Smokeless tobacco: Never Used  . Alcohol use No  . Drug use: No  . Sexual activity: Yes    Birth control/ protection: IUD   Other Topics Concern  . Not on file   Social History Narrative  . No narrative on file    Family History  Problem Relation Age of Onset  . Hypertension Mother   . Hyperlipidemia Mother   . Heart failure Paternal Grandmother   . Hypertension Paternal Grandmother   . Diabetes Paternal Grandmother   . Glaucoma Paternal Grandmother   . Other Father        drug overdose  . Drug abuse Father        overdose  . Hypertension Sister   . Cancer Maternal Grandmother   . Cirrhosis Paternal Grandfather   . Alcohol abuse Paternal Grandfather   . Cancer Other 78       breast, cervical and colon cancer     Review of Systems  Constitutional: Negative.  Negative for chills and fever.  HENT: Negative.  Negative for sore throat.   Eyes: Negative.  Negative for discharge and redness.  Respiratory: Negative.  Negative for cough and shortness of breath.   Cardiovascular: Negative.  Negative for chest pain.  Gastrointestinal: Negative.  Negative for abdominal pain, diarrhea, nausea and vomiting.  Genitourinary: Negative for dysuria and hematuria.  Musculoskeletal: Negative for myalgias and neck pain.  Skin: Positive for itching and rash.       Scalp erythema  Neurological: Negative.  Negative for dizziness and headaches.  Endo/Heme/Allergies: Negative.   All other systems reviewed and are negative.  Vitals:   03/15/17 1019  BP: (!) 148/94  Pulse: 92  Resp: 17  Temp: 98.6 F (37 C)  SpO2: 98%     Physical Exam  Constitutional: She is oriented to person, place, and time. She appears well-developed and well-nourished.  HENT:  Head: Normocephalic and atraumatic.  Eyes: Pupils are equal, round, and reactive to light. Conjunctivae and  EOM are normal.  Neck: Normal range of motion. Neck supple.  Cardiovascular: Normal rate and regular rhythm.   Pulmonary/Chest: Effort normal.  Musculoskeletal: Normal range of motion.  Lymphadenopathy:    She has cervical adenopathy (bilateral and posterior).  Neurological: She is alert and oriented to person, place, and time. No sensory deficit. She exhibits normal muscle tone.  Skin: Capillary refill takes less than 2 seconds. Rash noted.  Scalp: erythematous  Psychiatric: She has a normal mood and affect. Her behavior is normal.  Vitals reviewed.    ASSESSMENT & PLAN: Tammy Hogan was seen today for rash on head and lump in neck.  Diagnoses and all orders for this visit:  Chemical dermatitis  Scalp pain  Itching  Other orders -  predniSONE (DELTASONE) 20 MG tablet; Take 2 tablets (40 mg total) by mouth daily with breakfast. -     cefadroxil (DURICEF) 500 MG capsule; Take 1 capsule (500 mg total) by mouth 2 (two) times daily.    Patient Instructions       IF you received an x-ray today, you will receive an invoice from [facility]. Please contact [facility] at [phone] with questions or concerns regarding your invoice.   IF you received labwork today, you will receive an invoice from [place]. Please contact [facility] at [phone] with questions or concerns regarding your invoice.   Our billing staff will not be able to assist you with questions regarding bills from these companies.  You will be contacted with the lab results as soon as they are available. The fastest way to get your results is to activate your My Chart account. Instructions are located on the last page of this paperwork. If you have not heard from [place] regarding the results in 2 weeks, please contact this office.      Chemical Burn A chemical skin burn is an injury to your skin. It can also injure the structures below your skin. This type of burn is caused by coming into  contact with a chemical that can damage and kill tissue (caustic chemical). A chemical burn can be more serious than other burns. This is because the chemicals are typically on your skin for a longer time. In some cases, some chemicals continue to cause damage even after they have been removed from the skin. Follow these instructions at home: Medicines  Take and apply over-the-counter and prescription medicines only as told by your doctor.  If you were prescribed antibiotic medicine, take or apply it as told by your doctor. Do not stop using the antibiotic even if your condition gets better. Burn care  Follow instructions from your doctor about: ? How to clean your burn. ? How to take care of your burn. ? When and how you should remove your bandage (dressing).  Check your burn every day for signs of infection. Check for: ? More redness, swelling, or pain. ? Fluid or blood. ? Warmth. ? Pus or a bad smell.  Keep the bandage dry until your doctor says it can be taken off. Do not take baths, swim, use a hot tub, or do anything that would put your burn underwater until your doctor says it is okay. Activity  Do any range-of-motion movements as told by your physical therapist, if this applies.  Rest as told by your doctor. Do not exercise until your doctor says it is okay. General instructions  Raise (elevate) the injured area above the level of your heart while you are sitting or lying down.  Do not scratch or pick at the burn.  Do not break any blisters you may have. Do not peel any skin.  Protect your burn from the sun.  Drink enough fluid to keep your pee (urine) clear or pale yellow.  Keep all follow-up visits as told by your doctor. This is important. How is this prevented?  Try to avoid being around chemical that can cause burns.  Wear protective gloves and equipment when you touch dangerous chemicals.  Make sure all dangerous chemicals are labeled.  Where you work,  ask about which dangerous chemicals they use. Ask if a safer chemical can be used instead.  Make sure there is enough clean air in any place that has dangerous chemicals.  Keep your skin  clean and moist (moisturized). Dry skin is more likely to be hurt by chemicals. Contact a doctor if:  You got a tetanus shot and you have any of these problems in the area of the shot: ? Swelling. ? Very bad pain. ? Redness. ? Bleeding.  Your symptoms do not get better with treatment.  Medicine does not help your pain.  You have more redness, swelling, or pain around your burn.  Your burn feels warm to the touch.  Your burn looks different, or it has black or red spots. Get help right away if:  You have red streaks near the burn.  You have fluid, blood, or pus coming from the burn.  You have very bad swelling.  You have very bad pain.  There is a bad smell coming from your burn.  You have a fever.  You have numbness or tingling from the burned area or further down your legs or arms.  You have trouble breathing.  You are coughing or wheezing.  You have chest pain. This information is not intended to replace advice given to you by your health care provider. Make sure you discuss any questions you have with your health care provider. Document Released: 07/05/2004 Document Revised: 01/20/2016 Document Reviewed: 01/20/2016 Elsevier Interactive Patient Education  2017 [employer]      Agustina Caroli, MD [facility]

## 2017-03-25 ENCOUNTER — Encounter: Payer: Self-pay | Admitting: Family Medicine

## 2017-03-25 ENCOUNTER — Ambulatory Visit (INDEPENDENT_AMBULATORY_CARE_PROVIDER_SITE_OTHER): Payer: 59 | Admitting: Family Medicine

## 2017-03-25 ENCOUNTER — Other Ambulatory Visit: Payer: Self-pay | Admitting: Family Medicine

## 2017-03-25 VITALS — BP 138/82 | HR 109 | Temp 98.0°F | Resp 16 | Ht 63.78 in | Wt 253.0 lb

## 2017-03-25 DIAGNOSIS — R05 Cough: Secondary | ICD-10-CM

## 2017-03-25 DIAGNOSIS — B370 Candidal stomatitis: Secondary | ICD-10-CM

## 2017-03-25 DIAGNOSIS — L253 Unspecified contact dermatitis due to other chemical products: Secondary | ICD-10-CM | POA: Diagnosis not present

## 2017-03-25 DIAGNOSIS — R059 Cough, unspecified: Secondary | ICD-10-CM

## 2017-03-25 DIAGNOSIS — K219 Gastro-esophageal reflux disease without esophagitis: Secondary | ICD-10-CM | POA: Diagnosis not present

## 2017-03-25 MED ORDER — CLOBETASOL PROPIONATE 0.05 % EX CREA: 1.0000 "application " | TOPICAL_CREAM | Freq: Two times a day (BID) | CUTANEOUS | 0 refills | Status: DC

## 2017-03-25 MED ORDER — NYSTATIN 100000 UNIT/ML MT SUSP: 5.0000 mL | Freq: Four times a day (QID) | OROMUCOSAL | 0 refills | Status: DC

## 2017-03-25 MED ORDER — PANTOPRAZOLE SODIUM 40 MG PO TBEC: 40.0000 mg | DELAYED_RELEASE_TABLET | Freq: Every day | ORAL | 3 refills | Status: DC

## 2017-03-25 MED ORDER — PREDNISONE 20 MG PO TABS: ORAL_TABLET | ORAL | 0 refills | Status: DC

## 2017-03-25 NOTE — Patient Instructions (Signed)
     IF you received an x-ray today, you will receive an invoice from [facility]. Please contact  Radiology at [phone] with questions or concerns regarding your invoice.   IF you received labwork today, you will receive an invoice from [facility]. Please contact [facility] at [phone] with questions or concerns regarding your invoice.   Our billing staff will not be able to assist you with questions regarding bills from these companies.  You will be contacted with the lab results as soon as they are available. The fastest way to get your results is to activate your My Chart account. Instructions are located on the last page of this paperwork. If you have not heard from us regarding the results in 2 weeks, please contact this office.     

## 2017-03-25 NOTE — Progress Notes (Signed)
Subjective:    Patient ID: Tammy Hogan, female    DOB: January 19, 1976, 41 y.o.   MRN: [id number]  03/25/2017  Chemical Dermatitis (follow-up pt states she is still having the burning and itching on the scalp. )    HPI This 41 y.o. female presents for evaluation of chemical dermatitis.  Evaluated by Dr. Mitchel Honour on 03/15/17; treated with Prednisone and oral abx.  Chemical burn from the hair dye.  A couple of days ago, started burning again.  Nothing will stop itching.  abx cause thrush.  Caused thrush.  Stopped taking abx.  Mouth is better.  No burning with eating.  Hair color 03/11/17.  Finished Prednisone one week ago.  Gave five days worth.    Coughing due to Lisinopril. Onset of hypertension since age 23. Cardiologist recommended Robitussin or lozenges.   Excessive acid feeling in throat and in chest.  Belches are so loud.  Taking Prilosec 20mg  bid.    BP Readings from Last 3 Encounters:  03/25/17 138/82  03/15/17 (!) 148/94  03/11/17 132/80   Wt Readings from Last 3 Encounters:  03/25/17 253 lb (114.8 kg)  03/15/17 255 lb (115.7 kg)  03/11/17 251 lb 3.2 oz (113.9 kg)   Immunization History  Administered Date(s) Administered  . Influenza,inj,Quad PF,6+ Mos 07/11/2013  . Influenza-Unspecified 03/11/2016  . Pneumococcal Polysaccharide-23 07/11/2013  . Tdap 07/12/2013    Review of Systems  Constitutional: Negative for chills, diaphoresis, fatigue and fever.  HENT: Positive for mouth sores. Negative for congestion, drooling, ear discharge, ear pain, facial swelling, hearing loss, postnasal drip, rhinorrhea, sinus pain, sinus pressure, sneezing, sore throat, trouble swallowing and voice change.   Eyes: Negative for visual disturbance.  Respiratory: Positive for cough. Negative for shortness of breath.   Cardiovascular: Negative for chest pain, palpitations and leg swelling.  Gastrointestinal: Negative for abdominal distention, abdominal pain, anal bleeding, blood in stool,  constipation, diarrhea, nausea, rectal pain and vomiting.  Endocrine: Negative for cold intolerance, heat intolerance, polydipsia, polyphagia and polyuria.  Skin: Positive for rash.  Neurological: Negative for dizziness, tremors, seizures, syncope, facial asymmetry, speech difficulty, weakness, light-headedness, numbness and headaches.    Past Medical History:  Diagnosis Date  . Anginal pain ([place])   . [place], [place]  . Depression   . GERD (gastroesophageal reflux disease)   . History of cardiac cath 07/23/2012   Dr Irish Lack  . History of echocardiogram    Cardiomyopathy-reduced EF 35-40% with wall m otion abnormality concerning for ischemia. Hypokinesis of the anterior and anteroseptal walls to the apex  . HPV (human papilloma virus) infection   . Hyperlipidemia   . Hypertension   . PCOS (polycystic ovarian syndrome)   . PPD positive, treated 2008   [place],    Past Surgical History:  Procedure Laterality Date  . CARDIAC CATHETERIZATION  feb, 2014   Dr Irish Lack  . ORIF ANKLE FRACTURE Left 07/10/2013   Procedure: OPEN REDUCTION INTERNAL FIXATION (ORIF) LEFT BIMALLEOLAR ANKLE FRACTURE;  Surgeon: Mcarthur Rossetti, MD;  Location: [facility];  Service: Orthopedics;  Laterality: Left;   Allergies  Allergen Reactions  . Percocet [Oxycodone-Acetaminophen] Nausea Only   Current Outpatient Prescriptions on File Prior to Visit  Medication Sig Dispense Refill  . amLODipine (NORVASC) 10 MG tablet Take 1 tablet (10 mg total) by mouth daily. 90 tablet 3  . aspirin 81 MG tablet Take 81 mg by mouth daily.    [name] atorvastatin (LIPITOR) 10 MG tablet TAKE 1  TABLET BY MOUTH  DAILY 90 tablet 2  . buPROPion (WELLBUTRIN SR) 150 MG 12 hr tablet Take 1 tablet (150 mg total) by mouth 2 (two) times daily. 60 tablet 5  . carvedilol (COREG) 6.25 MG tablet Take 1 tablet (6.25 mg total) by mouth 2 (two) times daily. 180 tablet 3  . cyclobenzaprine (FLEXERIL) 10 MG tablet Take 0.5 tablets (5 mg  total) by mouth 3 (three) times daily as needed for muscle spasms. 30 tablet 0  . diphenhydramine-acetaminophen (TYLENOL PM) 25-500 MG TABS Take 1 tablet by mouth at bedtime as needed (for sleep).    . furosemide (LASIX) 20 MG tablet Take 1 tablet (20 mg total) by mouth daily. 90 tablet 3  . levonorgestrel (MIRENA) 20 MCG/24HR IUD 1 each by Intrauterine route once.    [name] lisinopril (PRINIVIL,ZESTRIL) 20 MG tablet Take 1 tablet (20 mg total) by mouth daily. 90 tablet 3  . nitroGLYCERIN (NITROSTAT) 0.4 MG SL tablet Place 1 tablet (0.4 mg total) under the tongue every 5 (five) minutes as needed for chest pain. 25 tablet 3  . omeprazole (PRILOSEC) 20 MG capsule Take 20 mg by mouth daily.    . psyllium (METAMUCIL SMOOTH TEXTURE) 28 % packet Take 1 packet by mouth 2 (two) times daily.     No current facility-administered medications on file prior to visit.    Social History   Social History  . Marital status: Single    Spouse name: N/A  . Number of children: N/A  . Years of education: N/A   Occupational History  . Not on file.   Social History Main Topics  . Smoking status: Current Every Day Smoker    Packs/day: 0.50    Years: 16.00  . Smokeless tobacco: Never Used  . Alcohol use No  . Drug use: No  . Sexual activity: Yes    Birth control/ protection: IUD   Other Topics Concern  . Not on file   Social History Narrative  . No narrative on file   Family History  Problem Relation Age of Onset  . Hypertension Mother   . Hyperlipidemia Mother   . Heart failure Paternal Grandmother   . Hypertension Paternal Grandmother   . Diabetes Paternal Grandmother   . Glaucoma Paternal Grandmother   . Other Father        drug overdose  . Drug abuse Father        overdose  . Hypertension Sister   . Cancer Maternal Grandmother   . Cirrhosis Paternal Grandfather   . Alcohol abuse Paternal Grandfather   . Cancer Other 78       breast, cervical and colon cancer       Objective:    BP  138/82   Pulse (!) 109   Temp 98 F (36.7 C) (Oral)   Resp 16   Ht 5' 3.78" (1.62 m)   Wt 253 lb (114.8 kg)   SpO2 96%   BMI 43.73 kg/m  Physical Exam  Constitutional: She is oriented to person, place, and time. She appears well-developed and well-nourished. No distress.  HENT:  Head: Normocephalic and atraumatic.  Right Ear: External ear normal.  Left Ear: External ear normal.  Nose: Nose normal.  Mouth/Throat: Oropharynx is clear and moist.  White film along tongue.  Eyes: Pupils are equal, round, and reactive to light. Conjunctivae and EOM are normal.  Neck: Normal range of motion. Neck supple. Carotid bruit is not present. No thyromegaly present.  Cardiovascular: Normal rate,  regular rhythm, normal heart sounds and intact distal pulses.  Exam reveals no gallop and no friction rub.   No murmur heard. Pulmonary/Chest: Effort normal and breath sounds normal. She has no wheezes. She has no rales.  Abdominal: Soft. Bowel sounds are normal. She exhibits no distension and no mass. There is no tenderness. There is no rebound and no guarding.  Lymphadenopathy:    She has no cervical adenopathy.  Neurological: She is alert and oriented to person, place, and time. No cranial nerve deficit.  Skin: Skin is warm and dry. No rash noted. She is not diaphoretic. There is erythema. No pallor.  Very mild erythema of scalp yet diffusely; no edema.  No vesicles or pustules along scalp.  Psychiatric: She has a normal mood and affect. Her behavior is normal.   No results found. Depression screen [facility] 2/9 03/25/2017 03/15/2017 03/11/2017 06/20/2016  Decreased Interest 0 0 0 1  Down, Depressed, Hopeless 0 0 0 3  PHQ - 2 Score 0 0 0 4  Altered sleeping - - - 3  Tired, decreased energy - - - 0  Change in appetite - - - 3  Feeling bad or failure about yourself  - - - 3  Trouble concentrating - - - 2  Moving slowly or fidgety/restless - - - 3  Suicidal thoughts - - - 0  PHQ-9 Score - - - 18    Difficult doing work/chores - - - Not difficult at all   Fall Risk  03/25/2017 03/15/2017 03/11/2017 06/20/2016  Falls in the past year? No No No No        Assessment & Plan:   1. Chemical dermatitis   2. Gastroesophageal reflux disease without esophagitis   3. Cough   4. Thrush     -persistent chemical dermatitis; rx for Prednisone, Clobetasol cream to use afterwards. -uncontrolled GERD; change Omeprazole to Protonix 40mg  daily; consider adding Zantac qhs. -rx for nystatin swish and swallow for thrush. -cough chronic; ddx includes GERD induced cough, ACEI cough; trial of Protonix; consider switching ACE to ARB.   No orders of the defined types were placed in this encounter.  Meds ordered this encounter  Medications  . pantoprazole (PROTONIX) 40 MG tablet    Sig: Take 1 tablet (40 mg total) by mouth daily.    Dispense:  90 tablet    Refill:  3  . clobetasol cream (TEMOVATE) 0.05 %    Sig: Apply 1 application topically 2 (two) times daily.    Dispense:  30 g    Refill:  0  . nystatin (MYCOSTATIN) 100000 UNIT/ML suspension    Sig: Take 5 mLs (500,000 Units total) by mouth 4 (four) times daily.    Dispense:  200 mL    Refill:  0  . predniSONE (DELTASONE) 20 MG tablet    Sig: Two tablets daily x 5 days then one tablet daily x 5 days    Dispense:  15 tablet    Refill:  0    No Follow-up on file.   Kristi Elayne Guerin, M.D. Primary Care at [facility] [address] [place], [place]  [address] [phone] phone [phone] fax

## 2017-04-04 ENCOUNTER — Encounter: Payer: Self-pay | Admitting: Urgent Care

## 2017-04-04 ENCOUNTER — Ambulatory Visit (INDEPENDENT_AMBULATORY_CARE_PROVIDER_SITE_OTHER): Payer: 59 | Admitting: Urgent Care

## 2017-04-04 ENCOUNTER — Ambulatory Visit (INDEPENDENT_AMBULATORY_CARE_PROVIDER_SITE_OTHER): Payer: 59

## 2017-04-04 VITALS — BP 140/100 | HR 97 | Temp 98.5°F | Resp 16 | Ht 63.0 in | Wt 253.8 lb

## 2017-04-04 DIAGNOSIS — F17200 Nicotine dependence, unspecified, uncomplicated: Secondary | ICD-10-CM | POA: Diagnosis not present

## 2017-04-04 DIAGNOSIS — I10 Essential (primary) hypertension: Secondary | ICD-10-CM

## 2017-04-04 DIAGNOSIS — Z8679 Personal history of other diseases of the circulatory system: Secondary | ICD-10-CM

## 2017-04-04 DIAGNOSIS — Z9109 Other allergy status, other than to drugs and biological substances: Secondary | ICD-10-CM

## 2017-04-04 DIAGNOSIS — R059 Cough, unspecified: Secondary | ICD-10-CM

## 2017-04-04 DIAGNOSIS — R05 Cough: Secondary | ICD-10-CM | POA: Diagnosis not present

## 2017-04-04 DIAGNOSIS — R030 Elevated blood-pressure reading, without diagnosis of hypertension: Secondary | ICD-10-CM

## 2017-04-04 MED ORDER — ALBUTEROL SULFATE HFA 108 (90 BASE) MCG/ACT IN AERS: 2.0000 | INHALATION_SPRAY | Freq: Four times a day (QID) | RESPIRATORY_TRACT | 1 refills | Status: DC | PRN

## 2017-04-04 MED ORDER — HYDROCODONE-HOMATROPINE 5-1.5 MG/5ML PO SYRP: 5.0000 mL | ORAL_SOLUTION | Freq: Every evening | ORAL | 0 refills | Status: DC | PRN

## 2017-04-04 NOTE — Patient Instructions (Addendum)
Cough, Adult Coughing is a reflex that clears your throat and your airways. Coughing helps to heal and protect your lungs. It is normal to cough occasionally, but a cough that happens with other symptoms or lasts a long time may be a sign of a condition that needs treatment. A cough may last only 2-3 weeks (acute), or it may last longer than 8 weeks (chronic). What are the causes? Coughing is commonly caused by:  Breathing in substances that irritate your lungs.  A viral or bacterial respiratory infection.  Allergies.  Asthma.  Postnasal drip.  Smoking.  Acid backing up from the stomach into the esophagus (gastroesophageal reflux).  Certain medicines.  Chronic lung problems, including COPD (or rarely, lung cancer).  Other medical conditions such as heart failure. Follow these instructions at home: Pay attention to any changes in your symptoms. Take these actions to help with your discomfort:  Take medicines only as told by your health care provider.  If you were prescribed an antibiotic medicine, take it as told by your health care provider. Do not stop taking the antibiotic even if you start to feel better.  Talk with your health care provider before you take a cough suppressant medicine.  Drink enough fluid to keep your urine clear or pale yellow.  If the air is dry, use a cold steam vaporizer or humidifier in your bedroom or your home to help loosen secretions.  Avoid anything that causes you to cough at work or at home.  If your cough is worse at night, try sleeping in a semi-upright position.  Avoid cigarette smoke. If you smoke, quit smoking. If you need help quitting, ask your health care provider.  Avoid caffeine.  Avoid alcohol.  Rest as needed. Contact a health care provider if:  You have new symptoms.  You cough up pus.  Your cough does not get better after 2-3 weeks, or your cough gets worse.  You cannot control your cough with suppressant medicines  and you are losing sleep.  You develop pain that is getting worse or pain that is not controlled with pain medicines.  You have a fever.  You have unexplained weight loss.  You have night sweats. Get help right away if:  You cough up blood.  You have difficulty breathing.  Your heartbeat is very fast. This information is not intended to replace advice given to you by your health care provider. Make sure you discuss any questions you have with your health care provider. Document Released: 11/24/2010 Document Revised: 11/03/2015 Document Reviewed: 08/04/2014 Elsevier Interactive Patient Education  2017 Elsevier Inc.     IF you received an x-ray today, you will receive an invoice from [facility]. Please contact [facility] at [phone] with questions or concerns regarding your invoice.   IF you received labwork today, you will receive an invoice from [facility]. Please contact [facility] at [phone] with questions or concerns regarding your invoice.   Our billing staff will not be able to assist you with questions regarding bills from these companies.  You will be contacted with the lab results as soon as they are available. The fastest way to get your results is to activate your My Chart account. Instructions are located on the last page of this paperwork. If you have not heard from us regarding the results in 2 weeks, please contact this office.      

## 2017-04-04 NOTE — Progress Notes (Signed)
  MRN: [id number] DOB: November 27, 1975  Subjective:   Tammy Hogan is a 41 y.o. female presenting for chief complaint of Cough (sometimes yellow to clear congestion)  Reports 2 week history of persistent cough. She has taken azithromycin, prednisone, promethazine for her cough without any improvement. Cough is worse at night. Uses Allegra daily for management of allergies. Smokes 1/2 ppd. Denies fever, chest pain, shob, dyspnea, wheezing, n/v, abdominal pain. Admits history of heart failure and states that these symptoms feel different. Denies orthopnea, needing more pillows to sleep. Reports compliance with furosemide, amlodipine, carvedilol, lisinopril.   Tammy Hogan has a current medication list which includes the following prescription(s): amlodipine, aspirin, atorvastatin, azithromycin, bupropion, carvedilol, clobetasol cream, furosemide, levonorgestrel, lisinopril, nitroglycerin, nystatin, pantoprazole, prednisone, promethazine-dextromethorphan, and psyllium. Also is allergic to percocet [oxycodone-acetaminophen].  Tammy Hogan  has a past medical history of Anginal pain ([place]); Anxiety; Depression; GERD (gastroesophageal reflux disease); History of cardiac cath (07/23/2012); History of echocardiogram; HPV (human papilloma virus) infection; Hyperlipidemia; Hypertension; PCOS (polycystic ovarian syndrome); and PPD positive, treated (2008). Also  has a past surgical history that includes Cardiac catheterization (feb, 2014) and ORIF ankle fracture (Left, 07/10/2013).  Objective:   Vitals: BP (!) 160/106   Pulse (!) 111   Temp 98.5 F (36.9 C) (Oral)   Resp 16   Ht 5\' 3"  (1.6 m)   Wt 253 lb 12.8 oz (115.1 kg)   SpO2 99%   BMI 44.96 kg/m   BP Readings from Last 3 Encounters:  04/04/17 (!) 160/106  03/25/17 138/82  03/15/17 (!) 148/94   Physical Exam  Constitutional: She is oriented to person, place, and time. She appears well-developed and well-nourished.  HENT:  Significant post-nasal drainage  noted.  Eyes: No scleral icterus.  Neck: Normal range of motion. Neck supple.  Cardiovascular: Normal rate, regular rhythm and intact distal pulses.  Exam reveals no gallop and no friction rub.   No murmur heard. Pulmonary/Chest: No respiratory distress. She has no wheezes. She has no rales.  Neurological: She is alert and oriented to person, place, and time.  Skin: Skin is warm and dry.   Dg Chest 2 View  Result Date: 04/04/2017 CLINICAL DATA:  Persistent cough. EXAM: CHEST  2 VIEW COMPARISON:  07/10/2013 FINDINGS: The cardiomediastinal silhouette is within normal limits. The lungs are well inflated and clear. There is no evidence of pleural effusion or pneumothorax. No acute osseous abnormality is identified. IMPRESSION: No active cardiopulmonary disease. Electronically Signed   By: Logan Bores M.D.   On: 04/04/2017 16:33    Assessment and Plan :   1. Cough 2. History of heart failure 3. Essential hypertension 4. Elevated blood pressure reading 5. Environmental allergies 6. Tobacco use disorder - Finish antibiotic, prednisone course. Stop smoking at least for a short duration to help stop cough. Start albuterol, use Hycodan for cough suppression. Recheck in 1 week if no improvement.  Tammy Eagles, PA-C [facility] at [facility] [phone] 04/04/2017  4:08 PM

## 2017-04-05 LAB — BRAIN NATRIURETIC PEPTIDE: BNP: 3 pg/mL (ref 0.0–100.0)

## 2017-05-15 ENCOUNTER — Ambulatory Visit (INDEPENDENT_AMBULATORY_CARE_PROVIDER_SITE_OTHER): Payer: 59 | Admitting: Family Medicine

## 2017-05-15 ENCOUNTER — Other Ambulatory Visit: Payer: Self-pay

## 2017-05-15 ENCOUNTER — Encounter: Payer: Self-pay | Admitting: Family Medicine

## 2017-05-15 VITALS — BP 130/70 | HR 97 | Temp 98.4°F | Resp 16 | Ht 63.0 in | Wt 258.4 lb

## 2017-05-15 DIAGNOSIS — M79644 Pain in right finger(s): Secondary | ICD-10-CM

## 2017-05-15 DIAGNOSIS — R202 Paresthesia of skin: Secondary | ICD-10-CM | POA: Diagnosis not present

## 2017-05-15 DIAGNOSIS — R200 Anesthesia of skin: Secondary | ICD-10-CM

## 2017-05-15 DIAGNOSIS — I10 Essential (primary) hypertension: Secondary | ICD-10-CM | POA: Diagnosis not present

## 2017-05-15 DIAGNOSIS — R29898 Other symptoms and signs involving the musculoskeletal system: Secondary | ICD-10-CM

## 2017-05-15 DIAGNOSIS — F17200 Nicotine dependence, unspecified, uncomplicated: Secondary | ICD-10-CM

## 2017-05-15 MED ORDER — GABAPENTIN 100 MG PO CAPS: ORAL_CAPSULE | ORAL | 0 refills | Status: DC

## 2017-05-15 NOTE — Patient Instructions (Signed)
     IF you received an x-ray today, you will receive an invoice from [facility]. Please contact [facility] at [phone] with questions or concerns regarding your invoice.   IF you received labwork today, you will receive an invoice from [facility]. Please contact [facility] at [phone] with questions or concerns regarding your invoice.   Our billing staff will not be able to assist you with questions regarding bills from these companies.  You will be contacted with the lab results as soon as they are available. The fastest way to get your results is to activate your My Chart account. Instructions are located on the last page of this paperwork. If you have not heard from us regarding the results in 2 weeks, please contact this office.     

## 2017-05-15 NOTE — Progress Notes (Signed)
Chief Complaint  Patient presents with  . numbness/tingling in right hand, its hard to grip pen and wr    x 2 weeks, pain level 4/10    HPI  Right handed female- new problem Pt reports that she has pain in her right hand at the ulnar and radial aspect She states that she has difficulty with grasping, some weakness and numbness and tingling Onset 2 weeks ago She has a history of shoulder pain that was evaluated but nothing was found as the cause She has pain in the neck radiating down to her shoulder She reports that now the pain is radiating down to her hand.  She was referred to PT for her shoulder but it did not help her She takes flexeril at night 20 mg helps her to sleep but doesn't help the numbness and tingling.  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 She is not exercising and is adherent to low salt diet.  Blood pressure is well controlled at home. Cardiac symptoms none. Patient denies chest pain, chest pressure/discomfort, claudication, dyspnea, lower extremity edema and near-syncope.  She is going to stop her lisinopril to see if that is why she is still coughing and plans to follow up with Cardiology to discuss her meds.  BP Readings from Last 3 Encounters:  05/15/17 130/70  04/04/17 (!) 140/100  03/25/17 138/82     Past Medical History:  Diagnosis Date  . Anginal pain ([place])   . [place], [place]  . Depression   . GERD (gastroesophageal reflux disease)   . History of cardiac cath 07/23/2012   Dr Irish Lack  . History of echocardiogram    Cardiomyopathy-reduced EF 35-40% with wall m otion abnormality concerning for ischemia. Hypokinesis of the anterior and  anteroseptal walls to the apex  . HPV (human papilloma virus) infection   . Hyperlipidemia   . Hypertension   . PCOS (polycystic ovarian syndrome)   . PPD positive, treated 2008   [place], [place]    Current Outpatient Medications  Medication Sig Dispense Refill  . albuterol (PROVENTIL HFA;VENTOLIN HFA) 108 (90 Base) MCG/ACT inhaler Inhale 2 puffs into the lungs every 6 (six) hours as needed. 1 Inhaler 1  . amLODipine (NORVASC) 10 MG tablet Take 1 tablet (10 mg total) by mouth daily. 90 tablet 3  . aspirin 81 MG tablet Take 81 mg by mouth daily.    [name] atorvastatin (LIPITOR) 10 MG tablet TAKE 1 TABLET BY MOUTH  DAILY 90 tablet 2  . buPROPion (WELLBUTRIN SR) 150 MG 12 hr tablet Take 1 tablet (150 mg total) by mouth 2 (two) times daily. 60 tablet 5  . carvedilol (COREG) 6.25 MG tablet Take 1 tablet (6.25 mg total) by mouth 2 (two) times daily. 180 tablet 3  . furosemide (LASIX) 20 MG tablet Take 1 tablet (20 mg total) by mouth daily. 90 tablet 3  . gabapentin (NEURONTIN) 100 MG capsule Take one capsule by mouth at bedtime and increase daily to a full dose of 300mg  at bedtime 60 capsule 0  . levonorgestrel (MIRENA) 20 MCG/24HR IUD 1 each by Intrauterine route once.    [name] lisinopril (  PRINIVIL,ZESTRIL) 20 MG tablet Take 1 tablet (20 mg total) by mouth daily. 90 tablet 3  . nitroGLYCERIN (NITROSTAT) 0.4 MG SL tablet Place 1 tablet (0.4 mg total) under the tongue every 5 (five) minutes as needed for chest pain. 25 tablet 3  . pantoprazole (PROTONIX) 40 MG tablet Take 1 tablet (40 mg total) by mouth daily. 90 tablet 3  . psyllium (METAMUCIL SMOOTH TEXTURE) 28 % packet Take 1 packet by mouth 2 (two) times daily.     No current facility-administered medications for this visit.     Allergies:  Allergies  Allergen Reactions  . Percocet [Oxycodone-Acetaminophen] Nausea Only    Past Surgical History:  Procedure Laterality Date  . CARDIAC CATHETERIZATION  feb, 2014   Dr Irish Lack  . ORIF ANKLE  FRACTURE Left 07/10/2013   Procedure: OPEN REDUCTION INTERNAL FIXATION (ORIF) LEFT BIMALLEOLAR ANKLE FRACTURE;  Surgeon: Mcarthur Rossetti, MD;  Location: [facility];  Service: Orthopedics;  Laterality: Left;    Social History   Socioeconomic History  . Marital status: Single    Spouse name: None  . Number of children: None  . Years of education: None  . Highest education level: None  Social Needs  . Financial resource strain: None  . Food insecurity - worry: None  . Food insecurity - inability: None  . Transportation needs - medical: None  . Transportation needs - non-medical: None  Occupational History  . None  Tobacco Use  . Smoking status: Current Every Day Smoker    Packs/day: 0.50    Years: 16.00    Pack years: 8.00  . Smokeless tobacco: Never Used  Substance and Sexual Activity  . Alcohol use: No    Alcohol/week: 0.0 oz  . Drug use: No  . Sexual activity: Yes    Birth control/protection: IUD  Other Topics Concern  . None  Social History Narrative  . None    Family History  Problem Relation Age of Onset  . Hypertension Mother   . Hyperlipidemia Mother   . Heart failure Paternal Grandmother   . Hypertension Paternal Grandmother   . Diabetes Paternal Grandmother   . Glaucoma Paternal Grandmother   . Other Father        drug overdose  . Drug abuse Father        overdose  . Hypertension Sister   . Cancer Maternal Grandmother   . Cirrhosis Paternal Grandfather   . Alcohol abuse Paternal Grandfather   . Cancer Other 78       breast, cervical and colon cancer     ROS Review of Systems See HPI Constitution: No fevers or chills No malaise No diaphoresis Skin: No rash or itching Eyes: no blurry vision, no double vision GU: no dysuria or hematuria Neuro: see hpi  all others reviewed and negative   Objective: Vitals:   05/15/17 1346  BP: 130/70  Pulse: 97  Resp: 16  Temp: 98.4 F (36.9 C)  TempSrc: Oral  SpO2: 100%  Weight: 258 lb 6.4 oz  (117.2 kg)  Height: 5\' 3"  (1.6 m)    Physical Exam  Constitutional: She is oriented to person, place, and time. She appears well-developed and well-nourished.  HENT:  Head: Normocephalic and atraumatic.  Eyes: Conjunctivae and EOM are normal.  Cardiovascular: Normal rate, regular rhythm and normal heart sounds.  No murmur heard. Pulmonary/Chest: Effort normal and breath sounds normal. No stridor. No respiratory distress.  Musculoskeletal:       Cervical back: She exhibits normal  range of motion, no tenderness, no bony tenderness, no swelling and no edema.       Right hand: She exhibits tenderness and bony tenderness. She exhibits normal capillary refill, no deformity, no laceration and no swelling. Normal sensation noted. Normal strength noted.       Left hand: Normal. She exhibits normal range of motion, no tenderness and no bony tenderness.  Neurological: She is alert and oriented to person, place, and time. She has normal strength. Coordination normal.  Reflex Scores:      Tricep reflexes are 2+ on the right side and 2+ on the left side.      Bicep reflexes are 2+ on the right side and 2+ on the left side.      Brachioradialis reflexes are 2+ on the right side and 2+ on the left side. Psychiatric: She has a normal mood and affect. Her behavior is normal. Judgment and thought content normal.   tinnels neg phalens neg Allen test normal circulation Tenderness over the thenar compartment and over the anatomic snuff box Ulnar head tenderness  Assessment and Plan Tammy Hogan was seen today for numbness/tingling in right hand, its hard to grip pen and wr.  Diagnoses and all orders for this visit:  Numbness and tingling in right hand- will refer for EMG and for Orthopedic evaluation Stop flexeril Start neurontin -     NCV with EMG(electromyography); Future -     Ambulatory referral to Orthopedic Surgery  Right hand weakness Thumb pain, right Follow up for evaluation She would be a  good candidate for rehab -     Ambulatory referral to Orthopedic Surgery  Tobacco use disorder- smoking cessation advised   Other orders -     gabapentin (NEURONTIN) 100 MG capsule; Take one capsule by mouth at bedtime and increase daily to a full dose of 300mg  at bedtime  Hypertension - bp improved, cpm   Geffrey Michaelsen A Kenadi Miltner

## 2017-06-06 ENCOUNTER — Telehealth: Payer: Self-pay | Admitting: Family Medicine

## 2017-06-06 ENCOUNTER — Other Ambulatory Visit: Payer: Self-pay | Admitting: Family Medicine

## 2017-06-06 NOTE — Telephone Encounter (Signed)
CRM for notification. See Telephone encounter for:   06/06/17.  Relation to pt: self Call back number: [phone]    Reason for call:  Patient was informed to inform PCP how gabapentin (NEURONTIN) 100 MG capsule was working, patient states she up to 300 MG at bedtime and not experiencing side effects, please advise

## 2017-06-06 NOTE — Telephone Encounter (Signed)
Pt requesting refill of Gabapentin 100mg .

## 2017-06-10 NOTE — Telephone Encounter (Signed)
See message below °

## 2017-06-13 ENCOUNTER — Telehealth: Payer: Self-pay | Admitting: Family Medicine

## 2017-06-13 NOTE — Telephone Encounter (Signed)
Copied from [place]. Topic: Quick Communication - Rx Refill/Question >> Jun 13, 2017  3:04 PM Yvette Rack wrote: Has the patient contacted their pharmacy? Yes.     (Agent: If no, request that the patient contact the pharmacy for the refill.)gabapentin (NEURONTIN) 100 MG capsule   Preferred Pharmacy (with phone number or street name): [place], [place] [phone] (Phone) [phone] (Fax)     Agent: Please be advised that RX refills may take up to 3 business days. We ask that you follow-up with your pharmacy.

## 2017-06-14 ENCOUNTER — Other Ambulatory Visit: Payer: Self-pay | Admitting: Family Medicine

## 2017-06-14 ENCOUNTER — Ambulatory Visit (INDEPENDENT_AMBULATORY_CARE_PROVIDER_SITE_OTHER): Payer: 59 | Admitting: Orthopaedic Surgery

## 2017-06-14 MED ORDER — GABAPENTIN 300 MG PO CAPS: 300.0000 mg | ORAL_CAPSULE | Freq: Every day | ORAL | 1 refills | Status: DC

## 2017-06-14 NOTE — Progress Notes (Signed)
Pt is taking 300mg  (3 capsules) so increased his dose

## 2017-06-25 NOTE — Progress Notes (Signed)
Dg rightDg right

## 2017-06-26 ENCOUNTER — Ambulatory Visit (INDEPENDENT_AMBULATORY_CARE_PROVIDER_SITE_OTHER): Payer: 59

## 2017-06-26 ENCOUNTER — Other Ambulatory Visit: Payer: Self-pay | Admitting: Podiatry

## 2017-06-26 ENCOUNTER — Ambulatory Visit: Payer: 59 | Admitting: Podiatry

## 2017-06-26 ENCOUNTER — Encounter: Payer: Self-pay | Admitting: Podiatry

## 2017-06-26 DIAGNOSIS — M79672 Pain in left foot: Secondary | ICD-10-CM

## 2017-06-26 DIAGNOSIS — M779 Enthesopathy, unspecified: Secondary | ICD-10-CM

## 2017-06-26 DIAGNOSIS — M2041 Other hammer toe(s) (acquired), right foot: Secondary | ICD-10-CM

## 2017-06-26 DIAGNOSIS — M7751 Other enthesopathy of right foot: Secondary | ICD-10-CM

## 2017-06-26 DIAGNOSIS — L84 Corns and callosities: Secondary | ICD-10-CM | POA: Diagnosis not present

## 2017-06-26 DIAGNOSIS — M216X9 Other acquired deformities of unspecified foot: Secondary | ICD-10-CM

## 2017-06-26 DIAGNOSIS — M79671 Pain in right foot: Secondary | ICD-10-CM | POA: Diagnosis not present

## 2017-06-26 NOTE — Progress Notes (Signed)
   Subjective:    Patient ID: Tammy Hogan, female    DOB: [birth year], 42 y.o.   MRN: [id number]  HPI    Review of Systems  All other systems reviewed and are negative.      Objective:   Physical Exam        Assessment & Plan:

## 2017-06-26 NOTE — Patient Instructions (Signed)
Pre-Operative Instructions  Congratulations, you have decided to take an important step towards improving your quality of life.  You can be assured that the doctors and staff at [facility] will be with you every step of the way.  Here are some important things you should know:  1. Plan to be at the surgery center/hospital at least 1 (one) hour prior to your scheduled time, unless otherwise directed by the surgical center/hospital staff.  You must have a responsible adult accompany you, remain during the surgery and drive you home.  Make sure you have directions to the surgical center/hospital to ensure you arrive on time. 2. If you are having surgery at [facility] or [facility] hospitals, you will need a copy of your medical history and physical form from your family physician within one month prior to the date of surgery. We will give you a form for your primary physician to complete.  3. We make every effort to accommodate the date you request for surgery.  However, there are times where surgery dates or times have to be moved.  We will contact you as soon as possible if a change in schedule is required.   4. No aspirin/ibuprofen for one week before surgery.  If you are on aspirin, any non-steroidal anti-inflammatory medications (Mobic, Aleve, Ibuprofen) should not be taken seven (7) days prior to your surgery.  You make take Tylenol for pain prior to surgery.  5. Medications - If you are taking daily heart and blood pressure medications, seizure, reflux, allergy, asthma, anxiety, pain or diabetes medications, make sure you notify the surgery center/hospital before the day of surgery so they can tell you which medications you should take or avoid the day of surgery. 6. No food or drink after midnight the night before surgery unless directed otherwise by surgical center/hospital staff. 7. No alcoholic beverages 24-hours prior to surgery.  No smoking 24-hours prior or 24-hours after  surgery. 8. Wear loose pants or shorts. They should be loose enough to fit over bandages, boots, and casts. 9. Don't wear slip-on shoes. Sneakers are preferred. 10. Bring your boot with you to the surgery center/hospital.  Also bring crutches or a walker if your physician has prescribed it for you.  If you do not have this equipment, it will be provided for you after surgery. 11. If you have not been contacted by the surgery center/hospital by the day before your surgery, call to confirm the date and time of your surgery. 12. Leave-time from work may vary depending on the type of surgery you have.  Appropriate arrangements should be made prior to surgery with your employer. 13. Prescriptions will be provided immediately following surgery by your doctor.  Fill these as soon as possible after surgery and take the medication as directed. Pain medications will not be refilled on weekends and must be approved by the doctor. 14. Remove nail polish on the operative foot and avoid getting pedicures prior to surgery. 15. Wash the night before surgery.  The night before surgery wash the foot and leg well with water and the antibacterial soap provided. Be sure to pay special attention to beneath the toenails and in between the toes.  Wash for at least three (3) minutes. Rinse thoroughly with water and dry well with a towel.  Perform this wash unless told not to do so by your physician.  Enclosed: 1 Ice pack (please put in freezer the night before surgery)   1 Hibiclens skin cleaner     Pre-op instructions  If you have any questions regarding the instructions, please do not hesitate to call our office.  [place]: [address], [place], [place] [address] -- [phone]  [place]: [address] [place], [place] [address] -- [phone]  [place]: [address]  [place], [place] [address] -- [phone]  [place]: [address], [address], [place],  [address] -- [phone]  Website:  [email or web address] 

## 2017-06-26 NOTE — Progress Notes (Signed)
Subjective:   Patient ID: Tammy Hogan, female   DOB: 42 y.o.   MRN: [id number]   HPI Patient presents with pain in the fourth and fifth digit right of long-term nature with a history of and she was young of trying to have it addressed with chronic discomfort and a lesion underneath the right outside foot that's been very sore and making it hard to walk. States she's tried to trim it she's tried to pad it and she's tried shoe gear modifications without relief of symptoms   Review of Systems  All other systems reviewed and are negative.       Objective:  Physical Exam  Constitutional: She appears well-developed and well-nourished.  Cardiovascular: Intact distal pulses.  Pulmonary/Chest: Effort normal.  Musculoskeletal: Normal range of motion.  Neurological: She is alert.  Skin: Skin is warm.  Nursing note and vitals reviewed.   Neurovascular status intact with patient noted to have significant lesion underneath the fifth metatarsal right with nucleated core and significant interspace lesion fourth interspace right that's very painful when pressed and making shoe gear difficult. 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 Discussed condition at great length and patient states she's had this for so many years that she's so sick of the pain and at this point due to this I've recommended surgical intervention with a partial soft tissue syndactylization along with removal of the head of the fifth digit right rock small phalanx and elevating osteotomy right with removal of plantar skin wedge right. I allowed her to read consent form going over at great length that there is no guarantee that this will solve her problems and that all complications that are  listed in consent form can occur and we also reviewed alternative treatments. Patient is completely comfortable with this and wants it done as soon as possible and at this time she signs consent form and is given all preoperative instructions for surgery and also I did dispense air fracture walker cast. Patient understands total recovery will take 6 months to one year  X-ray report indicates that there is abnormal position of the fifth digit right pressing against the fourth digit right

## 2017-06-29 ENCOUNTER — Other Ambulatory Visit: Payer: Self-pay | Admitting: Interventional Cardiology

## 2017-06-29 DIAGNOSIS — I10 Essential (primary) hypertension: Secondary | ICD-10-CM

## 2017-07-01 ENCOUNTER — Other Ambulatory Visit (INDEPENDENT_AMBULATORY_CARE_PROVIDER_SITE_OTHER): Payer: Self-pay

## 2017-07-01 ENCOUNTER — Ambulatory Visit (INDEPENDENT_AMBULATORY_CARE_PROVIDER_SITE_OTHER): Payer: 59 | Admitting: Orthopaedic Surgery

## 2017-07-01 ENCOUNTER — Encounter (INDEPENDENT_AMBULATORY_CARE_PROVIDER_SITE_OTHER): Payer: Self-pay | Admitting: Orthopaedic Surgery

## 2017-07-01 ENCOUNTER — Ambulatory Visit (INDEPENDENT_AMBULATORY_CARE_PROVIDER_SITE_OTHER): Payer: Self-pay

## 2017-07-01 VITALS — BP 158/94 | HR 72 | Resp 12 | Ht 62.0 in | Wt 258.0 lb

## 2017-07-01 DIAGNOSIS — M79641 Pain in right hand: Secondary | ICD-10-CM | POA: Diagnosis not present

## 2017-07-01 NOTE — Progress Notes (Signed)
Office Visit Note   Patient: Tammy Hogan           Date of Birth: May 24, 1976           MRN: [id number] Visit Date: 07/01/2017              Requested by: Tammy Moron, MD [place], [place] [address] PCP: Tammy Moron, MD   Assessment & Plan: Visit Diagnoses:  1. 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 We will apply a volar wrist splint. Continue with NSAIDs. Obtain EMGs and nerve conduction studies. Return after the studies have been performed  Follow-Up Instructions: Return after Tammy Hogan.   Orders:  Orders Placed This Encounter  Procedures  . XR Hand Complete Right   No orders of the defined types were placed in this encounter.     Procedures: No procedures performed   Clinical Data: No additional findings.   Subjective: Chief Complaint  Patient presents with  . Right Hand - Pain    [name] is a 42 y o here today for Right hand pain. Pt relatesher shoulder/neck pain radiating down to hand. R hand has weakness, numbness x 1 month. 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  Has had a chronic problem with her neck and right shoulder previously treated with Flexeril. She presently is having numbness and tingling along the radial 3 digits of her right hand particularly when she is using her hands on a repetitive basis i.e. typing using a pen. Doesn't really have  trouble  at night with pain. Some numbness while driving a car. No history of injury or trauma  HPI  Review of Systems  Constitutional: Negative for chills, fatigue and fever.  Eyes: Negative for itching.  Respiratory: Negative for chest tightness and shortness of breath.   Cardiovascular: Negative for chest pain, palpitations and leg swelling.  Gastrointestinal: Positive for rectal pain. Negative for blood in stool, constipation and diarrhea.  Endocrine: Negative for polyuria.  Genitourinary: Negative for dysuria.    Musculoskeletal: Positive for neck pain and neck stiffness. Negative for back pain and joint swelling.  Allergic/Immunologic: Negative for immunocompromised state.  Neurological: Negative for dizziness and numbness.  Hematological: Does not bruise/bleed easily.  Psychiatric/Behavioral: Positive for sleep disturbance. The patient is not nervous/anxious.      Objective: Vital Signs: BP (!) 158/94   Pulse 72   Resp 12   Ht 5\' 2"  (1.575 m)   Wt 258 lb (117 kg)   BMI 47.19 kg/m   Physical Exam  Ortho Exam awake alert and oriented 3 comfortable sitting. No pain referred to right upper extremity with motion of the cervical spine. Full overhead motion of shoulder. No impingement. Good grip and good release. Good opposition of thumb to little finger. Skin intact. Negative Tinel's but positive Phalen's right hand consistent with carpal tunnel syndrome.  Specialty Comments:  No specialty comments available.  Imaging: Xr Hand Complete Right  Result Date: 07/01/2017 Films of the right hand obtained several projections. No obvious fracture or dislocation. No obvious arthritis.    PMFS History: Patient Active Problem List   Diagnosis Date Noted  . Chemical dermatitis 03/15/2017  . Scalp pain 03/15/2017  . Itching 03/15/2017  . Hair loss 11/07/2016  . Gastroesophageal reflux disease 06/20/2016  . Mixed hyperlipidemia 05/30/2016  . Cervicalgia 05/21/2016  . Generalized anxiety disorder 05/15/2016  . Arthralgia of multiple joints 05/15/2016  .  BMI 45.0-49.9, adult ([place]) 05/15/2016  . Essential hypertension 09/07/2014  . Bimalleolar fracture of left ankle 07/10/2013  . Cardiomyopathy ([place]) 07/02/2012  . Systolic heart failure ([place]) 07/02/2012  . HTN (hypertension), malignant 07/01/2012  . Pulmonary edema 07/01/2012  . Back pain, acute 07/01/2012  . Medically noncompliant 07/01/2012  . Tobacco use disorder 07/01/2012   Past Medical History:  Diagnosis Date  . Anginal pain ([place])    . [place], [place]  . Depression   . GERD (gastroesophageal reflux disease)   . History of cardiac cath 07/23/2012   Dr Irish Lack  . History of echocardiogram    Cardiomyopathy-reduced EF 35-40% with wall m otion abnormality concerning for ischemia. Hypokinesis of the anterior and anteroseptal walls to the apex  . HPV (human papilloma virus) infection   . Hyperlipidemia   . Hypertension   . PCOS (polycystic ovarian syndrome)   . PPD positive, treated 2008   [place], [place]    Family History  Problem Relation Age of Onset  . Hypertension Mother   . Hyperlipidemia Mother   . Heart failure Paternal Grandmother   . Hypertension Paternal Grandmother   . Diabetes Paternal Grandmother   . Glaucoma Paternal Grandmother   . Other Father        drug overdose  . Drug abuse Father        overdose  . Hypertension Sister   . Cancer Maternal Grandmother   . Cirrhosis Paternal Grandfather   . Alcohol abuse Paternal Grandfather   . Cancer Other 78       breast, cervical and colon cancer    Past Surgical History:  Procedure Laterality Date  . CARDIAC CATHETERIZATION  feb, 2014   Dr Irish Lack  . ORIF ANKLE FRACTURE Left 07/10/2013   Procedure: OPEN REDUCTION INTERNAL FIXATION (ORIF) LEFT BIMALLEOLAR ANKLE FRACTURE;  Surgeon: Mcarthur Rossetti, MD;  Location: [facility];  Service: Orthopedics;  Laterality: Left;   Social History   Occupational History  . Not on file  Tobacco Use  . Smoking status: Current Every Day Smoker    Packs/day: 0.50    Years: 16.00    Pack years: 8.00  . Smokeless tobacco: Never Used  Substance and Sexual Activity  . Alcohol use: No    Alcohol/week: 0.0 oz  . Drug use: No  . Sexual activity: Yes    Birth control/protection: IUD

## 2017-07-02 ENCOUNTER — Telehealth: Payer: Self-pay | Admitting: *Deleted

## 2017-07-02 NOTE — Telephone Encounter (Signed)
"  I'm scheduled for surgery on February 5.  I need to reschedule that appointment until March around the 24th."  Dr. Paulla Dolly can do it on March 26.  "That will be perfect."  I will get it rescheduled.  I called Caren Griffins at [facility] and rescheduled the surgery from February 5 to March 26.

## 2017-07-17 ENCOUNTER — Encounter: Payer: Self-pay | Admitting: Interventional Cardiology

## 2017-07-22 NOTE — Progress Notes (Signed)
Cardiology Office Note   Date:  07/23/2017   ID:  Tammy Hogan, DOB 1975/08/11, MRN [id number]  PCP:  Forrest Moron, MD    No chief complaint on file. NICM/HTN   Wt Readings from Last 3 Encounters:  07/23/17 261 lb 6.4 oz (118.6 kg)  07/01/17 258 lb (117 kg)  05/15/17 258 lb 6.4 oz (117.2 kg)       History of Present Illness: Tammy Hogan is a 42 y.o. female   with obesity, PCOS and a nonischemic cardiomyopathy-diagnosed in 2014. She has had difficulty controlled her BP. No CAD by cath in 2/14.  She continues to smoke, but has cut back.  She stopped for a while.  See below.  She is now working for the city of [employer].  She enjoys her job.    Normal LV function noted in 2/16.  She is concerned about her hair loss and that the BP meds are contributing..  She continues to smoke, about 8 cigs/day, down from a pack a day.  BP at home is in the 196-222 range systolic and 97-989 diastolic.  Denies : Chest pain. Dizziness. Leg edema. Nitroglycerin use. Orthopnea. Palpitations. Paroxysmal nocturnal dyspnea. Shortness of breath. Syncope.   She reports a cough that she thinks is a smokers cough.  Walking daily.  Avoiding salt.      Past Medical History:  Diagnosis Date  . Anginal pain ([place])   . [place], [place]  . Depression   . GERD (gastroesophageal reflux disease)   . History of cardiac cath 07/23/2012   Dr Irish Lack  . History of echocardiogram    Cardiomyopathy-reduced EF 35-40% with wall m otion abnormality concerning for ischemia. Hypokinesis of the anterior and anteroseptal walls to the apex  . HPV (human papilloma virus) infection   . Hyperlipidemia   . Hypertension   . PCOS (polycystic ovarian syndrome)   . PPD positive, treated 2008   [place], [place]    Past Surgical History:  Procedure Laterality Date  . CARDIAC CATHETERIZATION  feb, 2014   Dr Irish Lack  . ORIF ANKLE FRACTURE Left 07/10/2013   Procedure: OPEN REDUCTION INTERNAL  FIXATION (ORIF) LEFT BIMALLEOLAR ANKLE FRACTURE;  Surgeon: Mcarthur Rossetti, MD;  Location: [facility];  Service: Orthopedics;  Laterality: Left;     Current Outpatient Medications  Medication Sig Dispense Refill  . amLODipine (NORVASC) 10 MG tablet TAKE 1 TABLET BY MOUTH  DAILY 90 tablet 1  . aspirin 81 MG tablet Take 81 mg by mouth daily.    [name] atorvastatin (LIPITOR) 10 MG tablet TAKE 1 TABLET BY MOUTH  DAILY 90 tablet 2  . buPROPion (WELLBUTRIN SR) 150 MG 12 hr tablet Take 1 tablet (150 mg total) by mouth 2 (two) times daily. 60 tablet 5  . carvedilol (COREG) 6.25 MG tablet Take 1 tablet (6.25 mg total) by mouth 2 (two) times daily. 180 tablet 3  . furosemide (LASIX) 20 MG tablet TAKE 1 TABLET BY MOUTH  EVERY DAY 90 tablet 1  . gabapentin (NEURONTIN) 300 MG capsule Take 1 capsule (300 mg total) by mouth at bedtime. 90 capsule 1  . levonorgestrel (MIRENA) 20 MCG/24HR IUD 1 each by Intrauterine route once.    [name] lisinopril (PRINIVIL,ZESTRIL) 20 MG tablet TAKE 1 TABLET BY MOUTH  DAILY 90 tablet 1  . nitroGLYCERIN (NITROSTAT) 0.4 MG SL tablet Place 1 tablet (0.4 mg total) under the tongue every 5 (five) minutes as needed for chest pain.  25 tablet 3  . pantoprazole (PROTONIX) 40 MG tablet Take 1 tablet (40 mg total) by mouth daily. 90 tablet 3  . psyllium (METAMUCIL SMOOTH TEXTURE) 28 % packet Take 1 packet by mouth 2 (two) times daily.     No current facility-administered medications for this visit.     Allergies:   Percocet [oxycodone-acetaminophen]    Social History:  The patient  reports that she has been smoking.  She has a 8.00 pack-year smoking history. she has never used smokeless tobacco. She reports that she does not drink alcohol or use drugs.   Family History:  The patient's family history includes Alcohol abuse in her paternal grandfather; Cancer in her maternal grandmother; Cancer (age of onset: 53) in her other; Cirrhosis in her paternal grandfather; Diabetes in her paternal  grandmother; Drug abuse in her father; Glaucoma in her paternal grandmother; Heart failure in her paternal grandmother; Hyperlipidemia in her mother; Hypertension in her mother, paternal grandmother, and sister; Other in her father.    ROS:  Please see the history of present illness.   Otherwise, review of systems are positive for hair loss- worked up by OB/GYN.   All other systems are reviewed and negative.    PHYSICAL EXAM: VS:  BP 140/90   Pulse 90   Ht 5\' 4"  (1.626 m)   Wt 261 lb 6.4 oz (118.6 kg)   BMI 44.87 kg/m  , BMI Body mass index is 44.87 kg/m. GEN: Well nourished, well developed, in no acute distress  HEENT: normal  Neck: no JVD, carotid bruits, or masses Cardiac: RRR; no murmurs, rubs, or gallops,no edema  Respiratory:  clear to auscultation bilaterally, normal work of breathing GI: soft, nontender, nondistended, + BS MS: no deformity or atrophy  Skin: warm and dry, no rash Neuro:  Strength and sensation are intact Psych: euthymic mood, full affect   EKG:   The ekg ordered today demonstrates normal ECG   Recent Labs: 03/11/2017: ALT 13; BUN 9; Creatinine, Ser 0.67; Hemoglobin 12.3; Platelets 431; Potassium 4.0; Sodium 140; TSH 1.260 04/04/2017: BNP 3.0   Lipid Panel    Component Value Date/Time   CHOL 150 03/11/2017 0950   TRIG 146 03/11/2017 0950   HDL 39 (L) 03/11/2017 0950   CHOLHDL 3.8 03/11/2017 0950   CHOLHDL 5.6 07/03/2012 0510   VLDL 35 07/03/2012 0510   LDLCALC 82 03/11/2017 0950     Other studies Reviewed: Additional studies/ records that were reviewed today with results demonstrating: 2016 echo showed normal LV function, mild MR .   ASSESSMENT AND PLAN:  1. NICM: No sx of CHF.   Clean cath in 2014.  COntinue to minimize salt.  Resolved. 2. HTN: BP is elevated.  Increase Coreg.  She needs to take this BID.  She had been taking 12.5 mg qHS. 3. Hyperlipidemia: LDL 82 in 10/18. 4. Tobacco abuse: She needs to stop smoking.   Current  medicines are reviewed at length with the patient today.  The patient concerns regarding her medicines were addressed.  The following changes have been made:  Increase Coreg to 12.5 mg BID.   Labs/ tests ordered today include:  No orders of the defined types were placed in this encounter.   Recommend 150 minutes/week of aerobic exercise Low fat, low carb, high fiber diet recommended  Disposition:   FU in 1 year;  She will f/u with PMD for BP check.   SignedLarae Grooms, MD  07/23/2017 3:47 PM    [facility] [address], [place], [place]  [address] Phone: [phone]; Fax: [phone]

## 2017-07-23 ENCOUNTER — Encounter: Payer: Self-pay | Admitting: Interventional Cardiology

## 2017-07-23 ENCOUNTER — Ambulatory Visit (INDEPENDENT_AMBULATORY_CARE_PROVIDER_SITE_OTHER): Payer: 59 | Admitting: Physical Medicine and Rehabilitation

## 2017-07-23 ENCOUNTER — Ambulatory Visit: Payer: 59 | Admitting: Interventional Cardiology

## 2017-07-23 ENCOUNTER — Encounter (INDEPENDENT_AMBULATORY_CARE_PROVIDER_SITE_OTHER): Payer: Self-pay | Admitting: Physical Medicine and Rehabilitation

## 2017-07-23 VITALS — BP 140/90 | HR 90 | Ht 64.0 in | Wt 261.4 lb

## 2017-07-23 DIAGNOSIS — I420 Dilated cardiomyopathy: Secondary | ICD-10-CM | POA: Diagnosis not present

## 2017-07-23 DIAGNOSIS — I10 Essential (primary) hypertension: Secondary | ICD-10-CM

## 2017-07-23 DIAGNOSIS — E782 Mixed hyperlipidemia: Secondary | ICD-10-CM | POA: Diagnosis not present

## 2017-07-23 DIAGNOSIS — R202 Paresthesia of skin: Secondary | ICD-10-CM | POA: Diagnosis not present

## 2017-07-23 DIAGNOSIS — F17200 Nicotine dependence, unspecified, uncomplicated: Secondary | ICD-10-CM

## 2017-07-23 MED ORDER — CARVEDILOL 12.5 MG PO TABS: 12.5000 mg | ORAL_TABLET | Freq: Two times a day (BID) | ORAL | 3 refills | Status: DC

## 2017-07-23 NOTE — Progress Notes (Deleted)
Pt states a constant pain in neck the radiates to the right arm and right hand. Pt also states numbness and tingling in the right hand, no specific fingers. Pt states her symptoms have been going on for about 2 years. Pt states she has a hard time gripping items, using her right hand makes the pain worse. Pt states pain medication helps with pain, but not numbness and tingling. Right hand dominant. No lotions or creams.

## 2017-07-23 NOTE — Patient Instructions (Signed)
Medication Instructions:  Your physician has recommended you make the following change in your medication:   INCREASE: carvedilol to 12.5 mg twice a day  Labwork: None ordered  Testing/Procedures: None ordered  Follow-Up: Keep your follow up appointment with your primary care doctor  Your physician wants you to follow-up in: 1 year with Dr. Irish Lack. You will receive a reminder letter in the mail two months in advance. If you don't receive a letter, please call our office to schedule the follow-up appointment.   Any Other Special Instructions Will Be Listed Below (If Applicable).     If you need a refill on your cardiac medications before your next appointment, please call your pharmacy.

## 2017-07-24 ENCOUNTER — Other Ambulatory Visit: Payer: 59

## 2017-07-26 ENCOUNTER — Ambulatory Visit (INDEPENDENT_AMBULATORY_CARE_PROVIDER_SITE_OTHER): Payer: 59 | Admitting: Orthopaedic Surgery

## 2017-07-26 ENCOUNTER — Encounter (INDEPENDENT_AMBULATORY_CARE_PROVIDER_SITE_OTHER): Payer: Self-pay | Admitting: Orthopaedic Surgery

## 2017-07-26 VITALS — BP 121/80 | HR 70 | Resp 14 | Ht 63.0 in | Wt 261.0 lb

## 2017-07-26 DIAGNOSIS — G5601 Carpal tunnel syndrome, right upper limb: Secondary | ICD-10-CM

## 2017-07-26 NOTE — Progress Notes (Signed)
Tammy Hogan - 42 y.o. female MRN [id number]  Date of birth: 09-08-1975  Office Visit Note: Visit Date: 07/23/2017 PCP: Forrest Moron, MD Referred by: Forrest Moron, MD  Subjective: Chief Complaint  Patient presents with  . Neck - Pain  . Right Arm - Pain, Numbness, Tingling  . Right Hand - Pain, Numbness, Tingling   HPI: Tammy Hogan is a 42 year old right-hand-dominant female who comes in today at the request of Dr. Durward Fortes for electrodiagnostic study of her right upper limb.  She reports 2 years of ongoing symptoms with constant pain in the neck which refers to the right arm and right shoulder.  She also gets numbness and tingling in the right hand and she endorses no specific fingers today although if really pressed it does seem like it is more of the radial 3 digits.  She reports a hard time gripping objects and using her right hand makes the pain worse.  She reports some relief with pain medication but not with the numbness or tingling.  She has not had prior electrodiagnostic studies or cervical imaging.  Per Dr. Rudene Anda notes this is been worsening over the last several months but the patient really today endorses chronic symptoms.  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  She reports no specific trauma.  She does use gabapentin.  Her case is complicated by history of cardiomyopathy as well as PCOS but no history of diabetes.    ROS Otherwise per HPI.  Assessment & Plan: Visit Diagnoses:  1. Paresthesia of skin     Plan: No additional findings.  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   There is no significant electrodiagnostic evidence of any other focal nerve entrapment, brachial plexopathy or cervical radiculopathy.   * As you know, this  particular electrodiagnostic study cannot rule out chemical radiculitis or sensory only radiculopathy.  Recommendations: 1.  Follow-up with referring physician. 2.  Continue current management of symptoms. 3.  Continue use of resting splint at night-time and as needed during the day.    Meds & Orders: No orders of the defined types were placed in this encounter.   Orders Placed This Encounter  Procedures  . NCV with EMG (electromyography)    Follow-up: Return for Dr. Durward Fortes.   Procedures: No procedures performed  EMG & NCV Findings: Evaluation of the right median motor nerve showed reduced amplitude (4.3 mV).  The right median (across palm) sensory nerve showed prolonged distal peak latency (Wrist, 4.3 ms).  All remaining nerves (as indicated in the following tables) were within normal limits.    All examined muscles (as indicated in the following table) showed no evidence of electrical instability.    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   There is no significant electrodiagnostic evidence of any other focal nerve entrapment, brachial plexopathy or cervical radiculopathy.   * As you know, this particular electrodiagnostic study cannot rule out chemical radiculitis or sensory only radiculopathy.  Recommendations: 1.  Follow-up with referring physician. 2.  Continue current management of symptoms. 3.  Continue use of resting splint at night-time and as needed during the day.  Nerve Conduction Studies Anti Sensory Summary Table  Stim Site NR Peak (ms) Norm Peak (ms) P-T Amp (V) Norm P-T Amp Site1 Site2 Delta-P (ms) Dist (cm) Vel (m/s) Norm Vel (m/s)  Right Median Acr Palm Anti Sensory (2nd Digit)  32.8C  Wrist    *4.3 <3.6 16.8 >10 Wrist Palm 2.3 0.0    Palm    2.0 <2.0 5.0         Right Radial Anti Sensory (Base 1st Digit)   32.4C  Wrist    2.0 <3.1 30.2  Wrist Base 1st Digit 2.0 0.0    Right Ulnar Anti Sensory (5th Digit)  33.2C  Wrist    2.9 <3.7 23.8 >15.0 Wrist 5th Digit 2.9 14.0 48 >38   Motor Summary Table   Stim Site NR Onset (ms) Norm Onset (ms) O-P Amp (mV) Norm O-P Amp Site1 Site2 Delta-0 (ms) Dist (cm) Vel (m/s) Norm Vel (m/s)  Right Median Motor (Abd Poll Brev)  32.2C  Wrist    4.1 <4.2 *4.3 >5 Elbow Wrist 3.7 21.0 57 >50  Elbow    7.8  4.9         Right Ulnar Motor (Abd Dig Min)  32.1C  Wrist    2.9 <4.2 6.6 >3 B Elbow Wrist 3.7 20.5 55 >53  B Elbow    6.6  9.7  A Elbow B Elbow 1.4 11.0 79 >53  A Elbow    8.0  9.4          EMG   Side Muscle Nerve Root Ins Act Fibs Psw Amp Dur Poly Recrt Int Fraser Din Comment  Right 1stDorInt Ulnar C8-T1 Nml Nml Nml Nml Nml 0 Nml Nml   Right Abd Poll Brev Median C8-T1 Nml Nml Nml Nml Nml 0 Nml Nml   Right ExtDigCom   Nml Nml Nml Nml Nml 0 Nml Nml   Right Triceps Radial C6-7-8 Nml Nml Nml Nml Nml 0 Nml Nml   Right Deltoid Axillary C5-6 Nml Nml Nml Nml Nml 0 Nml Nml     Nerve Conduction Studies Anti Sensory Left/Right Comparison   Stim Site L Lat (ms) R Lat (ms) L-R Lat (ms) L Amp (V) R Amp (V) L-R Amp (%) Site1 Site2 L Vel (m/s) R Vel (m/s) L-R Vel (m/s)  Median Acr Palm Anti Sensory (2nd Digit)  32.8C  Wrist  *4.3   16.8  Wrist Palm     Palm  2.0   5.0        Radial Anti Sensory (Base 1st Digit)  32.4C  Wrist  2.0   30.2  Wrist Base 1st Digit     Ulnar Anti Sensory (5th Digit)  33.2C  Wrist  2.9   23.8  Wrist 5th Digit  48    Motor Left/Right Comparison   Stim Site L Lat (ms) R Lat (ms) L-R Lat (ms) L Amp (mV) R Amp (mV) L-R Amp (%) Site1 Site2 L Vel (m/s) R Vel (m/s) L-R Vel (m/s)  Median Motor (Abd Poll Brev)  32.2C  Wrist  4.1   *4.3  Elbow Wrist  57   Elbow  7.8   4.9        Ulnar Motor (Abd Dig Min)  32.1C  Wrist  2.9   6.6  B Elbow Wrist  55   B Elbow  6.6   9.7  A Elbow B Elbow  79   A Elbow  8.0   9.4           Waveforms:  Clinical History: No specialty comments available.  She reports that she has been smoking.  She has a 8.00 pack-year smoking history. she has never used smokeless tobacco.  Recent Labs    03/11/17 0950  HGBA1C 5.1    Objective:  VS:  HT:    WT:   BMI:     BP:   HR: bpm  TEMP: ( )  RESP:  Physical Exam  Musculoskeletal:  Inspection reveals no atrophy of the bilateral APB or FDI or hand intrinsics. There is no swelling, color changes, allodynia or dystrophic changes. There is 5 out of 5 strength in the bilateral wrist extension, finger abduction and long finger flexion. There is intact sensation to light touch in all dermatomal and peripheral nerve distributions. There is a negative Hoffmann's test bilaterally.    Ortho Exam Imaging: No results found.  Past Medical/Family/Surgical/Social History: Medications & Allergies reviewed per EMR Patient Active Problem List   Diagnosis Date Noted  . Chemical dermatitis 03/15/2017  . Scalp pain 03/15/2017  . Itching 03/15/2017  . Hair loss 11/07/2016  . Gastroesophageal reflux disease 06/20/2016  . Mixed hyperlipidemia 05/30/2016  . Cervicalgia 05/21/2016  . Generalized anxiety disorder 05/15/2016  . Arthralgia of multiple joints 05/15/2016  . BMI 45.0-49.9, adult ([place]) 05/15/2016  . Essential hypertension 09/07/2014  . Bimalleolar fracture of left ankle 07/10/2013  . Cardiomyopathy ([place]) 07/02/2012  . Systolic heart failure ([place]) 07/02/2012  . HTN (hypertension), malignant 07/01/2012  . Pulmonary edema 07/01/2012  . Back pain, acute 07/01/2012  . Medically noncompliant 07/01/2012  . Tobacco use disorder 07/01/2012   Past Medical History:  Diagnosis Date  . Anginal pain ([place])   . [place], [place]  . Depression   . GERD (gastroesophageal reflux disease)   . History of cardiac cath 07/23/2012   Dr Irish Lack  . History of echocardiogram    Cardiomyopathy-reduced EF 35-40% with wall m otion abnormality  concerning for ischemia. Hypokinesis of the anterior and anteroseptal walls to the apex  . HPV (human papilloma virus) infection   . Hyperlipidemia   . Hypertension   . PCOS (polycystic ovarian syndrome)   . PPD positive, treated 2008   [place], [place]   Family History  Problem Relation Age of Onset  . Hypertension Mother   . Hyperlipidemia Mother   . Heart failure Paternal Grandmother   . Hypertension Paternal Grandmother   . Diabetes Paternal Grandmother   . Glaucoma Paternal Grandmother   . Other Father        drug overdose  . Drug abuse Father        overdose  . Hypertension Sister   . Cancer Maternal Grandmother   . Cirrhosis Paternal Grandfather   . Alcohol abuse Paternal Grandfather   . Cancer Other 78       breast, cervical and colon cancer   Past Surgical History:  Procedure Laterality Date  . CARDIAC CATHETERIZATION  feb, 2014   Dr Irish Lack  . ORIF ANKLE FRACTURE Left 07/10/2013   Procedure: OPEN REDUCTION INTERNAL FIXATION (ORIF) LEFT BIMALLEOLAR ANKLE FRACTURE;  Surgeon: Mcarthur Rossetti, MD;  Location: [facility];  Service: Orthopedics;  Laterality: Left;   Social History   Occupational History  . Not on file  Tobacco Use  . Smoking status: Current Every Day Smoker    Packs/day: 0.50    Years: 16.00    Pack years: 8.00  . Smokeless tobacco: Never Used  Substance and Sexual Activity  .  Alcohol use: No    Alcohol/week: 0.0 oz  . Drug use: No  . Sexual activity: Yes    Birth control/protection: IUD

## 2017-07-26 NOTE — Progress Notes (Signed)
Office Visit Note   Patient: Tammy Hogan           Date of Birth: 1975-10-06           MRN: [id number] Visit Date: 07/26/2017              Requested by: Forrest Moron, MD [place], [place] [id number] PCP: Forrest Moron, MD   Assessment & Plan: Visit Diagnoses:  1. Carpal tunnel syndrome, right upper limb     Plan: EMGs and nerve conduction studies performed by Dr. Ernestina Patches indicated mild right median nerve entrapment at the wrist. There is no significant evidence of any other focal nerve entrapments. Tammy Hogan is doing well at the present time wearing the splint and her right wrist on occasions in taking gabapentin. We will simply monitor this over time and consider further treatment as necessary this may include carpal tunnel injection or even surgery. We've had a long discussion with this she is fine for the present  Follow-Up Instructions: Return if symptoms worsen or fail to improve.   Orders:  No orders of the defined types were placed in this encounter.  No orders of the defined types were placed in this encounter.     Procedures: No procedures performed   Clinical Data: No additional findings.   Subjective: Chief Complaint  Patient presents with  . Right Hand - Pain    Tammy Hogan is a 42 year old right-hand-dominant female that had [place] perform for electrodiagnostic study of her right upper limb.  She reports 2 years of ongoing symptoms.  Chronic pain on the right upper extremity which may or may not be related to her cervical spine or possibly a peripheral neuropathy. Presently doing well with time and gabapentin. Had EMGs and nerve conduction studies by Dr. Ernestina Patches indicating a mild right carpal tunnel compression. She is not having much pain. Comfortable knowing that the results indicate carpal tunnel rather than some other  aggressive lesion. Or diagnosis. Does perform a lot of repetitive activity at work. Comfortable at  present  HPI  Review of Systems  Constitutional: Negative for chills, fatigue and fever.  HENT: Negative for hearing loss and tinnitus.   Eyes: Negative for itching.  Respiratory: Negative for chest tightness.   Cardiovascular: Negative for chest pain, palpitations and leg swelling.  Gastrointestinal: Negative for blood in stool, constipation and diarrhea.  Endocrine: Negative for polyuria.  Genitourinary: Negative for dysuria.  Musculoskeletal: Negative for back pain, neck pain and neck stiffness.  Allergic/Immunologic: Negative for immunocompromised state.  Neurological: Negative for dizziness, numbness and headaches.  Hematological: Does not bruise/bleed easily.  Psychiatric/Behavioral: Negative for sleep disturbance. The patient is not nervous/anxious.      Objective: Vital Signs: BP 121/80   Pulse 70   Resp 14   Ht 5\' 3"  (1.6 m)   Wt 261 lb (118.4 kg)   BMI 46.23 kg/m   Physical Exam  Ortho Exam awake alert noted 3. Comfortable sitting. No pain with range of motion of the cervical spine. Cannot reproduce any pain with motion of her cervical spine. No pain with range of motion of the right shoulder or right elbow. Very minimal Tinel's over the median nerve at the right wrist. Full grip and full release. No swelling. Neurovascular exam intact. Good operative position of thumb to little finger. Presently asymptomatic  Specialty Comments:  No specialty comments available.  Imaging: No results found.   PMFS History: Patient Active Problem List   Diagnosis Date  Noted  . Chemical dermatitis 03/15/2017  . Scalp pain 03/15/2017  . Itching 03/15/2017  . Hair loss 11/07/2016  . Gastroesophageal reflux disease 06/20/2016  . Mixed hyperlipidemia 05/30/2016  . Cervicalgia 05/21/2016  . Generalized anxiety disorder 05/15/2016  . Arthralgia of multiple joints 05/15/2016  . BMI 45.0-49.9, adult ([place]) 05/15/2016  . Essential hypertension 09/07/2014  . Bimalleolar fracture  of left ankle 07/10/2013  . Cardiomyopathy ([place]) 07/02/2012  . Systolic heart failure ([place]) 07/02/2012  . HTN (hypertension), malignant 07/01/2012  . Pulmonary edema 07/01/2012  . Back pain, acute 07/01/2012  . Medically noncompliant 07/01/2012  . Tobacco use disorder 07/01/2012   Past Medical History:  Diagnosis Date  . Anginal pain ([place])   . [place], [place]  . Depression   . GERD (gastroesophageal reflux disease)   . History of cardiac cath 07/23/2012   Dr Irish Lack  . History of echocardiogram    Cardiomyopathy-reduced EF 35-40% with wall m otion abnormality concerning for ischemia. Hypokinesis of the anterior and anteroseptal walls to the apex  . HPV (human papilloma virus) infection   . Hyperlipidemia   . Hypertension   . PCOS (polycystic ovarian syndrome)   . PPD positive, treated 2008   [place], [place]    Family History  Problem Relation Age of Onset  . Hypertension Mother   . Hyperlipidemia Mother   . Heart failure Paternal Grandmother   . Hypertension Paternal Grandmother   . Diabetes Paternal Grandmother   . Glaucoma Paternal Grandmother   . Other Father        drug overdose  . Drug abuse Father        overdose  . Hypertension Sister   . Cancer Maternal Grandmother   . Cirrhosis Paternal Grandfather   . Alcohol abuse Paternal Grandfather   . Cancer Other 78       breast, cervical and colon cancer    Past Surgical History:  Procedure Laterality Date  . CARDIAC CATHETERIZATION  feb, 2014   Dr Irish Lack  . ORIF ANKLE FRACTURE Left 07/10/2013   Procedure: OPEN REDUCTION INTERNAL FIXATION (ORIF) LEFT BIMALLEOLAR ANKLE FRACTURE;  Surgeon: Mcarthur Rossetti, MD;  Location: [facility];  Service: Orthopedics;  Laterality: Left;   Social History   Occupational History  . Not on file  Tobacco Use  . Smoking status: Current Every Day Smoker    Packs/day: 0.50    Years: 16.00    Pack years: 8.00  . Smokeless tobacco: Never Used  Substance and Sexual  Activity  . Alcohol use: No    Alcohol/week: 0.0 oz  . Drug use: No  . Sexual activity: Yes    Birth control/protection: IUD

## 2017-07-26 NOTE — Procedures (Signed)
EMG & NCV Findings: Evaluation of the right median motor nerve showed reduced amplitude (4.3 mV).  The right median (across palm) sensory nerve showed prolonged distal peak latency (Wrist, 4.3 ms).  All remaining nerves (as indicated in the following tables) were within normal limits.    All examined muscles (as indicated in the following table) showed no evidence of electrical instability.    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   There is no significant electrodiagnostic evidence of any other focal nerve entrapment, brachial plexopathy or cervical radiculopathy.   * As you know, this particular electrodiagnostic study cannot rule out chemical radiculitis or sensory only radiculopathy.  Recommendations: 1.  Follow-up with referring physician. 2.  Continue current management of symptoms. 3.  Continue use of resting splint at night-time and as needed during the day.  Nerve Conduction Studies Anti Sensory Summary Table   Stim Site NR Peak (ms) Norm Peak (ms) P-T Amp (V) Norm P-T Amp Site1 Site2 Delta-P (ms) Dist (cm) Vel (m/s) Norm Vel (m/s)  Right Median Acr Palm Anti Sensory (2nd Digit)  32.8C  Wrist    *4.3 <3.6 16.8 >10 Wrist Palm 2.3 0.0    Palm    2.0 <2.0 5.0         Right Radial Anti Sensory (Base 1st Digit)  32.4C  Wrist    2.0 <3.1 30.2  Wrist Base 1st Digit 2.0 0.0    Right Ulnar Anti Sensory (5th Digit)  33.2C  Wrist    2.9 <3.7 23.8 >15.0 Wrist 5th Digit 2.9 14.0 48 >38   Motor Summary Table   Stim Site NR Onset (ms) Norm Onset (ms) O-P Amp (mV) Norm O-P Amp Site1 Site2 Delta-0 (ms) Dist (cm) Vel (m/s) Norm Vel (m/s)  Right Median Motor (Abd Poll Brev)  32.2C  Wrist    4.1 <4.2 *4.3 >5 Elbow Wrist 3.7 21.0 57 >50  Elbow    7.8  4.9         Right Ulnar Motor (Abd Dig Min)  32.1C  Wrist    2.9 <4.2 6.6 >3 B Elbow Wrist  3.7 20.5 55 >53  B Elbow    6.6  9.7  A Elbow B Elbow 1.4 11.0 79 >53  A Elbow    8.0  9.4          EMG   Side Muscle Nerve Root Ins Act Fibs Psw Amp Dur Poly Recrt Int Fraser Din Comment  Right 1stDorInt Ulnar C8-T1 Nml Nml Nml Nml Nml 0 Nml Nml   Right Abd Poll Brev Median C8-T1 Nml Nml Nml Nml Nml 0 Nml Nml   Right ExtDigCom   Nml Nml Nml Nml Nml 0 Nml Nml   Right Triceps Radial C6-7-8 Nml Nml Nml Nml Nml 0 Nml Nml   Right Deltoid Axillary C5-6 Nml Nml Nml Nml Nml 0 Nml Nml     Nerve Conduction Studies Anti Sensory Left/Right Comparison   Stim Site L Lat (ms) R Lat (ms) L-R Lat (ms) L Amp (V) R Amp (V) L-R Amp (%) Site1 Site2 L Vel (m/s) R Vel (m/s) L-R Vel (m/s)  Median Acr Palm Anti Sensory (2nd Digit)  32.8C  Wrist  *4.3   16.8  Wrist Palm     Palm  2.0   5.0        Radial Anti Sensory (Base 1st Digit)  32.4C  Wrist  2.0   30.2  Wrist Base 1st Digit     Ulnar Anti Sensory (5th Digit)  33.2C  Wrist  2.9   23.8  Wrist 5th Digit  48    Motor Left/Right Comparison   Stim Site L Lat (ms) R Lat (ms) L-R Lat (ms) L Amp (mV) R Amp (mV) L-R Amp (%) Site1 Site2 L Vel (m/s) R Vel (m/s) L-R Vel (m/s)  Median Motor (Abd Poll Brev)  32.2C  Wrist  4.1   *4.3  Elbow Wrist  57   Elbow  7.8   4.9        Ulnar Motor (Abd Dig Min)  32.1C  Wrist  2.9   6.6  B Elbow Wrist  55   B Elbow  6.6   9.7  A Elbow B Elbow  79   A Elbow  8.0   9.4           Waveforms:

## 2017-08-14 ENCOUNTER — Ambulatory Visit: Payer: 59 | Admitting: Family Medicine

## 2017-08-14 NOTE — Progress Notes (Deleted)
No chief complaint on file.   HPI  4 review of systems  Past Medical History:  Diagnosis Date  . Anginal pain ([place])   . [place], [place]  . Depression   . GERD (gastroesophageal reflux disease)   . History of cardiac cath 07/23/2012   Dr Irish Lack  . History of echocardiogram    Cardiomyopathy-reduced EF 35-40% with wall m otion abnormality concerning for ischemia. Hypokinesis of the anterior and anteroseptal walls to the apex  . HPV (human papilloma virus) infection   . Hyperlipidemia   . Hypertension   . PCOS (polycystic ovarian syndrome)   . PPD positive, treated 2008   [place], [place]    Current Outpatient Medications  Medication Sig Dispense Refill  . amLODipine (NORVASC) 10 MG tablet TAKE 1 TABLET BY MOUTH  DAILY 90 tablet 1  . aspirin 81 MG tablet Take 81 mg by mouth daily.    [name] atorvastatin (LIPITOR) 10 MG tablet TAKE 1 TABLET BY MOUTH  DAILY 90 tablet 2  . buPROPion (WELLBUTRIN SR) 150 MG 12 hr tablet Take 1 tablet (150 mg total) by mouth 2 (two) times daily. 60 tablet 5  . carvedilol (COREG) 12.5 MG tablet Take 1 tablet (12.5 mg total) by mouth 2 (two) times daily. 180 tablet 3  . furosemide (LASIX) 20 MG tablet TAKE 1 TABLET BY MOUTH  EVERY DAY 90 tablet 1  . gabapentin (NEURONTIN) 300 MG capsule Take 1 capsule (300 mg total) by mouth at bedtime. 90 capsule 1  . levonorgestrel (MIRENA) 20 MCG/24HR IUD 1 each by Intrauterine route once.    [name] lisinopril (PRINIVIL,ZESTRIL) 20 MG tablet TAKE 1 TABLET BY MOUTH  DAILY 90 tablet 1  . nitroGLYCERIN (NITROSTAT) 0.4 MG SL tablet Place 1 tablet (0.4 mg total) under the tongue every 5 (five) minutes as needed for chest pain. 25 tablet 3  . pantoprazole (PROTONIX) 40 MG tablet Take 1 tablet (40 mg total) by mouth daily. 90 tablet 3  . psyllium (METAMUCIL SMOOTH TEXTURE) 28 % packet Take 1 packet by mouth 2 (two) times daily.     No current facility-administered medications for this visit.     Allergies:  Allergies    Allergen Reactions  . Percocet [Oxycodone-Acetaminophen] Nausea Only    Past Surgical History:  Procedure Laterality Date  . CARDIAC CATHETERIZATION  feb, 2014   Dr Irish Lack  . ORIF ANKLE FRACTURE Left 07/10/2013   Procedure: OPEN REDUCTION INTERNAL FIXATION (ORIF) LEFT BIMALLEOLAR ANKLE FRACTURE;  Surgeon: Mcarthur Rossetti, MD;  Location: [facility];  Service: Orthopedics;  Laterality: Left;    Social History   Socioeconomic History  . Marital status: Single    Spouse name: Not on file  . Number of children: Not on file  . Years of education: Not on file  . Highest education level: Not on file  Social Needs  . Financial resource strain: Not on file  . Food insecurity - worry: Not on file  . Food insecurity - inability: Not on file  . Transportation needs - medical: Not on file  . Transportation needs - non-medical: Not on file  Occupational History  . Not on file  Tobacco Use  . Smoking status: Current Every Day Smoker    Packs/day: 0.50    Years: 16.00    Pack years: 8.00  . Smokeless tobacco: Never Used  Substance and Sexual Activity  . Alcohol use: No    Alcohol/week: 0.0 oz  . Drug  use: No  . Sexual activity: Yes    Birth control/protection: IUD  Other Topics Concern  . Not on file  Social History Narrative  . Not on file    Family History  Problem Relation Age of Onset  . Hypertension Mother   . Hyperlipidemia Mother   . Heart failure Paternal Grandmother   . Hypertension Paternal Grandmother   . Diabetes Paternal Grandmother   . Glaucoma Paternal Grandmother   . Other Father        drug overdose  . Drug abuse Father        overdose  . Hypertension Sister   . Cancer Maternal Grandmother   . Cirrhosis Paternal Grandfather   . Alcohol abuse Paternal Grandfather   . Cancer Other 78       breast, cervical and colon cancer     ROS Review of Systems See HPI Constitution: No fevers or chills No malaise No diaphoresis Skin: No rash or  itching Eyes: no blurry vision, no double vision GU: no dysuria or hematuria Neuro: no dizziness or headaches * all others reviewed and negative   Objective: There were no vitals filed for this visit.  Physical Exam  Assessment and Plan There are no diagnoses linked to this encounter.   Tammy Hogan P [employer]

## 2017-08-20 ENCOUNTER — Telehealth: Payer: Self-pay | Admitting: *Deleted

## 2017-08-20 NOTE — Telephone Encounter (Signed)
"  This is [employer].  I need to reschedule my surgery again."  When are you scheduled?  "I am scheduled for March 26 and I'd like to move it to April 15."  He can't do it on April 15 but he can do it on April 16.  "Okay that date will be fine.  Someone will call me few days before with the arrival time, correct?"  Yes, that's correct.  I called Caren Griffins at [facility] and rescheduled her surgery from 09/03/2017 to 09/24/2017.

## 2017-09-03 ENCOUNTER — Ambulatory Visit: Payer: 59 | Admitting: Family Medicine

## 2017-09-03 ENCOUNTER — Other Ambulatory Visit: Payer: Self-pay

## 2017-09-03 ENCOUNTER — Encounter: Payer: Self-pay | Admitting: Family Medicine

## 2017-09-03 VITALS — BP 158/78 | HR 94 | Temp 97.9°F | Ht 64.57 in | Wt 258.0 lb

## 2017-09-03 DIAGNOSIS — L250 Unspecified contact dermatitis due to cosmetics: Secondary | ICD-10-CM

## 2017-09-03 MED ORDER — CLOBETASOL PROPIONATE 0.05 % EX CREA: 1.0000 "application " | TOPICAL_CREAM | Freq: Two times a day (BID) | CUTANEOUS | 0 refills | Status: DC

## 2017-09-03 MED ORDER — PREDNISONE 20 MG PO TABS: 40.0000 mg | ORAL_TABLET | Freq: Every day | ORAL | 0 refills | Status: DC

## 2017-09-03 NOTE — Progress Notes (Signed)
3/26/20198:25 AM  Tammy Hogan [birth year], 42 y.o. female [id number]  Chief Complaint  Patient presents with  . Allergic Reaction    Used an organi hairdye that caused burning itching and swelling to the scalp, spreading to neck and arms. Causing headach as well. Taking otc for pain with no resolve    HPI:   Patient is a 42 y.o. female who presents today rash of scalp that started after applying hair dye 3 days ago. She states that rash is itchy, spreading down into neck. She had this happen once before when she tried dying her hair. She though that by using an organic product she would not have these issues. Last time she needed a long steroid course plus clobetasol that was added after a week of incomplete resolution. She denies any swelling of lips, tongue, etc. She denies any problems with breathing or swallowing.  Depression screen [facility] 2/9 09/03/2017 05/15/2017 04/04/2017  Decreased Interest 0 0 0  Down, Depressed, Hopeless 0 0 0  PHQ - 2 Score 0 0 0  Altered sleeping - - -  Tired, decreased energy - - -  Change in appetite - - -  Feeling bad or failure about yourself  - - -  Trouble concentrating - - -  Moving slowly or fidgety/restless - - -  Suicidal thoughts - - -  PHQ-9 Score - - -  Difficult doing work/chores - - -    Allergies  Allergen Reactions  . Percocet [Oxycodone-Acetaminophen] Nausea Only    Prior to Admission medications   Medication Sig Start Date End Date Taking? Authorizing Provider  amLODipine (NORVASC) 10 MG tablet TAKE 1 TABLET BY MOUTH  DAILY 07/01/17  Yes Jettie Booze, MD  aspirin 81 MG tablet Take 81 mg by mouth daily.   Yes [provider]  atorvastatin (LIPITOR) 10 MG tablet TAKE 1 TABLET BY MOUTH  DAILY 02/20/17  Yes Jettie Booze, MD  buPROPion [facility] SR) 150 MG 12 hr tablet Take 1 tablet (150 mg total) by mouth 2 (two) times daily. 10/05/16  Yes Nche, Charlene Brooke, NP  carvedilol (COREG) 12.5 MG tablet Take 1  tablet (12.5 mg total) by mouth 2 (two) times daily. 07/23/17 10/21/17 Yes Jettie Booze, MD  furosemide (LASIX) 20 MG tablet TAKE 1 TABLET BY MOUTH  EVERY DAY 07/01/17  Yes Jettie Booze, MD  gabapentin (NEURONTIN) 300 MG capsule Take 1 capsule (300 mg total) by mouth at bedtime. 06/14/17  Yes Forrest Moron, MD  levonorgestrel (MIRENA) 20 MCG/24HR IUD 1 each by Intrauterine route once.   Yes [provider]  lisinopril (PRINIVIL,ZESTRIL) 20 MG tablet TAKE 1 TABLET BY MOUTH  DAILY 07/01/17  Yes Jettie Booze, MD  nitroGLYCERIN (NITROSTAT) 0.4 MG SL tablet Place 1 tablet (0.4 mg total) under the tongue every 5 (five) minutes as needed for chest pain. 05/30/16  Yes Jettie Booze, MD  pantoprazole (PROTONIX) 40 MG tablet Take 1 tablet (40 mg total) by mouth daily. 03/25/17  Yes Wardell Honour, MD  psyllium (METAMUCIL SMOOTH TEXTURE) 28 % packet Take 1 packet by mouth 2 (two) times daily. 01/30/17  Yes Nche, Charlene Brooke, NP    Past Medical History:  Diagnosis Date  . Anginal pain ([place])   . [place], [place]  . Depression   . GERD (gastroesophageal reflux disease)   . History of cardiac cath 07/23/2012   Dr Irish Lack  . History of echocardiogram  Cardiomyopathy-reduced EF 35-40% with wall m otion abnormality concerning for ischemia. Hypokinesis of the anterior and anteroseptal walls to the apex  . HPV (human papilloma virus) infection   . Hyperlipidemia   . Hypertension   . PCOS (polycystic ovarian syndrome)   . PPD positive, treated 2008   [place], [place]    Past Surgical History:  Procedure Laterality Date  . CARDIAC CATHETERIZATION  feb, 2014   Dr Irish Lack  . ORIF ANKLE FRACTURE Left 07/10/2013   Procedure: OPEN REDUCTION INTERNAL FIXATION (ORIF) LEFT BIMALLEOLAR ANKLE FRACTURE;  Surgeon: Mcarthur Rossetti, MD;  Location: [facility];  Service: Orthopedics;  Laterality: Left;    Social History   Tobacco Use  . Smoking status: Current Every  Day Smoker    Packs/day: 0.50    Years: 16.00    Pack years: 8.00  . Smokeless tobacco: Never Used  Substance Use Topics  . Alcohol use: No    Alcohol/week: 0.0 oz    Family History  Problem Relation Age of Onset  . Hypertension Mother   . Hyperlipidemia Mother   . Heart failure Paternal Grandmother   . Hypertension Paternal Grandmother   . Diabetes Paternal Grandmother   . Glaucoma Paternal Grandmother   . Other Father        drug overdose  . Drug abuse Father        overdose  . Hypertension Sister   . Cancer Maternal Grandmother   . Cirrhosis Paternal Grandfather   . Alcohol abuse Paternal Grandfather   . Cancer Other 78       breast, cervical and colon cancer    ROS Per hpi  OBJECTIVE:  Blood pressure (!) 158/78, pulse 94, temperature 97.9 F (36.6 C), temperature source Oral, height 5' 4.57" (1.64 m), weight 258 lb (117 kg), SpO2 97 %.  Physical Exam  Constitutional: She is oriented to person, place, and time and well-developed, well-nourished, and in no distress.  HENT:  Head: Normocephalic and atraumatic.  Mouth/Throat: Oropharynx is clear and moist. No oropharyngeal exudate.  Eyes: Pupils are equal, round, and reactive to light. EOM are normal. No scleral icterus.  Neck: Neck supple.  Cardiovascular: Normal rate, regular rhythm and normal heart sounds. Exam reveals no gallop and no friction rub.  No murmur heard. Pulmonary/Chest: Effort normal and breath sounds normal. She has no wheezes. She has no rales.  Musculoskeletal: She exhibits no edema.  Neurological: She is alert and oriented to person, place, and time. Gait normal.  Skin: Skin is warm and dry.  Scalp with erythema, no swelling, pustules or skin breakdown noted. She also is having erythema and swelling of her pinnas and mild spreading down her neck.      ASSESSMENT and PLAN  1. Contact dermatitis due to cosmetics, unspecified contact dermatitis type Discussed supportive measures, new meds  r/se/b and RTC precautions.  - predniSONE (DELTASONE) 20 MG tablet; Take 2 tablets (40 mg total) by mouth daily with breakfast. - clobetasol cream (TEMOVATE) 0.05 %; Apply 1 application topically 2 (two) times daily.  Return if symptoms worsen or fail to improve.    Rutherford Guys, MD Primary Care at [facility] [place], [place] [address] Ph.  [phone] Fax [phone]

## 2017-09-03 NOTE — Patient Instructions (Addendum)
1. Start prednisone and clobetasol use simultaneously. Continue with benadryl as needed for itching. Return if not getting better or if you start having swelling of lips, tongue, problems with breathing.     IF you received an x-ray today, you will receive an invoice from [facility]. Please contact [facility] at [phone] with questions or concerns regarding your invoice.   IF you received labwork today, you will receive an invoice from [facility]. Please contact [facility] at [phone] with questions or concerns regarding your invoice.   Our billing staff will not be able to assist you with questions regarding bills from these companies.  You will be contacted with the lab results as soon as they are available. The fastest way to get your results is to activate your My Chart account. Instructions are located on the last page of this paperwork. If you have not heard from [place] regarding the results in 2 weeks, please contact this office.

## 2017-09-11 ENCOUNTER — Other Ambulatory Visit: Payer: 59

## 2017-09-16 ENCOUNTER — Telehealth: Payer: Self-pay | Admitting: *Deleted

## 2017-09-16 NOTE — Telephone Encounter (Signed)
"  I'm calling to cancel my surgery.  I know I have called and rescheduled several times but I just started a new job and I can't take off work right now."  I understand.  I'll cancel the appointment at the surgical center and I will let Dr. Paulla Dolly know.

## 2017-09-17 NOTE — Telephone Encounter (Signed)
I will cancel her post-op visits.

## 2017-10-02 ENCOUNTER — Other Ambulatory Visit: Payer: Self-pay | Admitting: Obstetrics and Gynecology

## 2017-10-02 ENCOUNTER — Other Ambulatory Visit: Payer: 59

## 2017-10-02 DIAGNOSIS — N6310 Unspecified lump in the right breast, unspecified quadrant: Secondary | ICD-10-CM

## 2017-10-04 DIAGNOSIS — Z01419 Encounter for gynecological examination (general) (routine) without abnormal findings: Secondary | ICD-10-CM | POA: Diagnosis not present

## 2017-10-04 DIAGNOSIS — Z6841 Body Mass Index (BMI) 40.0 and over, adult: Secondary | ICD-10-CM | POA: Diagnosis not present

## 2017-10-07 ENCOUNTER — Ambulatory Visit
Admission: RE | Admit: 2017-10-07 | Discharge: 2017-10-07 | Disposition: A | Discharge status: Home / Self Care | Payer: 59 | Source: Ambulatory Visit | Attending: Obstetrics and Gynecology | Admitting: Obstetrics and Gynecology

## 2017-10-07 ENCOUNTER — Ambulatory Visit: Payer: 59

## 2017-10-07 DIAGNOSIS — N6310 Unspecified lump in the right breast, unspecified quadrant: Secondary | ICD-10-CM

## 2017-10-07 DIAGNOSIS — R928 Other abnormal and inconclusive findings on diagnostic imaging of breast: Secondary | ICD-10-CM | POA: Diagnosis not present

## 2017-10-19 ENCOUNTER — Other Ambulatory Visit: Payer: Self-pay | Admitting: Interventional Cardiology

## 2017-10-31 ENCOUNTER — Encounter: Payer: Self-pay | Admitting: Family Medicine

## 2017-12-04 ENCOUNTER — Other Ambulatory Visit: Payer: Self-pay | Admitting: Interventional Cardiology

## 2017-12-04 DIAGNOSIS — I10 Essential (primary) hypertension: Secondary | ICD-10-CM

## 2018-01-08 ENCOUNTER — Ambulatory Visit: Payer: 59 | Admitting: Family Medicine

## 2018-01-15 ENCOUNTER — Other Ambulatory Visit: Payer: Self-pay

## 2018-01-15 ENCOUNTER — Encounter: Payer: Self-pay | Admitting: Family Medicine

## 2018-01-15 ENCOUNTER — Ambulatory Visit: Payer: 59 | Admitting: Family Medicine

## 2018-01-15 VITALS — BP 158/106 | HR 86 | Temp 98.5°F | Resp 16 | Ht 64.57 in | Wt 260.4 lb

## 2018-01-15 DIAGNOSIS — L739 Follicular disorder, unspecified: Secondary | ICD-10-CM

## 2018-01-15 DIAGNOSIS — R29898 Other symptoms and signs involving the musculoskeletal system: Secondary | ICD-10-CM

## 2018-01-15 DIAGNOSIS — Z8679 Personal history of other diseases of the circulatory system: Secondary | ICD-10-CM

## 2018-01-15 DIAGNOSIS — R457 State of emotional shock and stress, unspecified: Secondary | ICD-10-CM

## 2018-01-15 DIAGNOSIS — K219 Gastro-esophageal reflux disease without esophagitis: Secondary | ICD-10-CM | POA: Diagnosis not present

## 2018-01-15 DIAGNOSIS — I10 Essential (primary) hypertension: Secondary | ICD-10-CM | POA: Diagnosis not present

## 2018-01-15 MED ORDER — CLINDAMYCIN PHOSPHATE 1 % EX LOTN: TOPICAL_LOTION | Freq: Two times a day (BID) | CUTANEOUS | 0 refills | Status: DC

## 2018-01-15 MED ORDER — GABAPENTIN 300 MG PO CAPS: 300.0000 mg | ORAL_CAPSULE | Freq: Every day | ORAL | 1 refills | Status: DC

## 2018-01-15 MED ORDER — BUPROPION HCL ER (XL) 300 MG PO TB24: 300.0000 mg | ORAL_TABLET | Freq: Every day | ORAL | 1 refills | Status: DC

## 2018-01-15 NOTE — Progress Notes (Signed)
Chief Complaint  Patient presents with  . discuss medications    ? if she is getting any benefits from the wellbutrin and protonix not helping either.  Pt is burping all day.   . Medication Refill    gabapentin and ? xanax to help with the stress, insomnia and general unrest she is feeling.  Gabapentin [facility] as well as whatever is  sent for the acid reflux    HPI   Pt reports that she is taking gabapentin and would like it sent to [facility]   GERD: Paitent complains of heartburn. This has been associated with belching, bilious reflux and heartburn.  She denies chest pain, choking on food, cough, difficulty swallowing, dysphagia, early satiety, fullness after meals, hematemesis, laryngitis, melena, midespigastric pain, regurgitation of undigested food, unexpected weight loss and upper abdominal discomfort. Symptoms have been present for several years. She denies dysphagia.  She has not lost weight. She denies melena, hematochezia, hematemesis, and coffee ground emesis. Medical therapy in the past has included proton pump inhibitorsShe is sleeping propped up in the bed to help her symptoms.  She is walking for exercise for 1-2 miles a day walking at least 3 days a week.  Wt Readings from Last 3 Encounters:  01/15/18 260 lb 6.4 oz (118.1 kg)  09/03/17 258 lb (117 kg)  07/26/17 261 lb (118.4 kg)    Hypertension: Patient here for follow-up of elevated blood pressure. She is exercising and is adherent to low salt diet.  Blood pressure is not well controlled at home because she often misses her evening dose. Cardiac symptoms none. Patient denies chest pain, chest pressure/discomfort, dyspnea, exertional chest pressure/discomfort, fatigue, lower extremity edema, palpitations and paroxysmal nocturnal dyspnea.  Cardiovascular risk factors: hypertension and obesity (BMI >= 30 kg/m2). Use of agents associated with hypertension: none. History of target organ damage: heart failure. BP Readings  from Last 3 Encounters:  01/15/18 (!) 158/106  09/03/17 (!) 158/78  07/26/17 121/80   She takes her bp at 18:29HB at night Systolic Heart Failure -  She sees cardiology for mgmt with Dr. 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  She states that she takes amlodipine 10mg  and carvedilol 12.5mg  in the morning and lisinopril, furosemide and carvedilol at night.  Carpal Tunnel Gabapentin is helping her carpal tunnel and shoulder pain She states hat the numbness and tingling in the right hand got better She sleeps with a brace on the right  Emotional Stress She reports that she is unable to relax, her father died 2022-07-21 She works 2 jobs She has a transgender niece She states that she cannot make her mind shut off She denies depression She reports that she does not feel anxious just tense She reports that she does not know if the wellbutrin is helping her  Depression screen [facility] 2/9 01/15/2018 09/03/2017 05/15/2017 04/04/2017 03/25/2017  Decreased Interest 0 0 0 0 0  Down, Depressed, Hopeless 0 0 0 0 0  PHQ - 2 Score 0 0 0 0 0  Altered sleeping - - - - -  Tired, decreased energy - - - - -  Change in appetite - - - - -  Feeling bad or failure about yourself  - - - - -  Trouble concentrating - - - - -  Moving slowly or fidgety/restless - - - - -  Suicidal thoughts - - - - -  PHQ-9 Score - - - - -  Difficult doing work/chores - - - - -  Past Medical History:  Diagnosis Date  . Anginal pain ([place])   . [place], [place]  . Depression   . GERD (gastroesophageal reflux disease)   . History of cardiac cath 07/23/2012   Dr Irish Lack  . History of echocardiogram    Cardiomyopathy-reduced EF 35-40% with wall m otion abnormality concerning for ischemia. Hypokinesis of the anterior and anteroseptal walls to the apex  . HPV (human papilloma virus) infection   . Hyperlipidemia   . Hypertension   . PCOS (polycystic ovarian syndrome)   . PPD positive, treated 2008   [place], [place]    Current Outpatient Medications  Medication Sig Dispense Refill  . amLODipine (NORVASC) 10 MG tablet TAKE 1 TABLET BY MOUTH  DAILY 90 tablet 2  . aspirin 81 MG tablet Take 81 mg by mouth daily.    [name] atorvastatin (LIPITOR) 10 MG tablet TAKE 1 TABLET BY MOUTH  DAILY 90 tablet 2  . furosemide (LASIX) 20 MG tablet TAKE 1 TABLET BY MOUTH  EVERY DAY 90 tablet 2  . gabapentin (NEURONTIN) 300 MG capsule Take 1 capsule (300 mg total) by mouth at bedtime. 90 capsule 1  . levonorgestrel (MIRENA) 20 MCG/24HR IUD 1 each by Intrauterine route once.    [name] lisinopril (PRINIVIL,ZESTRIL) 20 MG tablet TAKE 1 TABLET BY MOUTH  DAILY 90 tablet 2  . nitroGLYCERIN (NITROSTAT) 0.4 MG SL tablet Place 1 tablet (0.4 mg total) under the tongue every 5 (five) minutes as needed for chest pain. 25 tablet 3  . pantoprazole (PROTONIX) 40 MG tablet Take 1 tablet (40 mg total) by mouth daily. 90 tablet 3  . psyllium (METAMUCIL SMOOTH TEXTURE) 28 % packet Take 1 packet by mouth 2 (two) times daily.    [name] buPROPion (WELLBUTRIN XL) 300 MG 24 hr tablet Take 1 tablet (300 mg total) by mouth daily. 90 tablet 1  . carvedilol (COREG) 12.5 MG tablet Take 1 tablet (12.5 mg total) by mouth 2 (two) times daily. 180 tablet 3  . clindamycin (CLEOCIN-T) 1 % lotion Apply topically 2 (two) times daily. 60 mL 0   No current facility-administered medications for this visit.     Allergies:  Allergies  Allergen Reactions  . Percocet [Oxycodone-Acetaminophen] Nausea Only    Past Surgical History:  Procedure Laterality Date  . CARDIAC CATHETERIZATION  feb, 2014   Dr Irish Lack  . ORIF ANKLE FRACTURE Left 07/10/2013   Procedure: OPEN REDUCTION INTERNAL FIXATION (ORIF) LEFT BIMALLEOLAR ANKLE FRACTURE;  Surgeon: Mcarthur Rossetti, MD;  Location: [facility];  Service: Orthopedics;  Laterality: Left;    Social History   Socioeconomic History  . Marital status: Single    Spouse name: Not on file  . Number of children: Not on file    . Years of education: Not on file  . Highest education level: Not on file  Occupational History  . Not on file  Social Needs  . Financial resource strain: Not on file  . Food insecurity:    Worry: Not on file    Inability: Not on file  . Transportation needs:    Medical: Not on file    Non-medical: Not on file  Tobacco Use  . Smoking status: Current Every Day Smoker    Packs/day: 0.50    Years: 16.00    Pack years: 8.00  . Smokeless tobacco: Never Used  Substance and Sexual Activity  . Alcohol use: No    Alcohol/week: 0.0 oz  . Drug  use: No  . Sexual activity: Yes    Birth control/protection: IUD  Lifestyle  . Physical activity:    Days per week: Not on file    Minutes per session: Not on file  . Stress: Not on file  Relationships  . Social connections:    Talks on phone: Not on file    Gets together: Not on file    Attends religious service: Not on file    Active member of club or organization: Not on file    Attends meetings of clubs or organizations: Not on file    Relationship status: Not on file  Other Topics Concern  . Not on file  Social History Narrative  . Not on file    Family History  Problem Relation Age of Onset  . Hypertension Mother   . Hyperlipidemia Mother   . Heart failure Paternal Grandmother   . Hypertension Paternal Grandmother   . Diabetes Paternal Grandmother   . Glaucoma Paternal Grandmother   . Other Father        drug overdose  . Drug abuse Father        overdose  . Hypertension Sister   . Cancer Maternal Grandmother   . Cirrhosis Paternal Grandfather   . Alcohol abuse Paternal Grandfather   . Cancer Other 78       breast, cervical and colon cancer     ROS Review of Systems See HPI Constitution: No fevers or chills No malaise No diaphoresis Skin: No rash or itching Eyes: no blurry vision, no double vision GU: no dysuria or hematuria Neuro: no dizziness or headaches all others reviewed and negative    Objective: Vitals:   01/15/18 1426  BP: (!) 158/106  Pulse: 86  Resp: 16  Temp: 98.5 F (36.9 C)  TempSrc: Oral  SpO2: 99%  Weight: 260 lb 6.4 oz (118.1 kg)  Height: 5' 4.57" (1.64 m)    Physical Exam  Constitutional: She is oriented to person, place, and time. She appears well-developed and well-nourished.  HENT:  Head: Normocephalic and atraumatic.  Right Ear: External ear normal.  Left Ear: External ear normal.  Mouth/Throat: Oropharynx is clear and moist.  Eyes: Conjunctivae and EOM are normal.  Neck: Normal range of motion. Neck supple.  Cardiovascular: Normal rate, regular rhythm and normal heart sounds.  No murmur heard. Pulmonary/Chest: Effort normal and breath sounds normal. No stridor. No respiratory distress. She has no wheezes.  Musculoskeletal: Normal range of motion. She exhibits no edema or deformity.  Neurological: She is alert and oriented to person, place, and time.  Skin: Skin is warm. Capillary refill takes less than 2 seconds. No rash noted.  Small follicular nodules at the mons pubis  Psychiatric: She has a normal mood and affect. Her behavior is normal. Judgment and thought content normal.    Assessment and Plan [place] was seen today for discuss medications and medication refill.  Diagnoses and all orders for this visit:  Gastroesophageal reflux disease without esophagitis- would advised EGD, referral placed for GI  Continue protonix -     Ambulatory referral to Gastroenterology  Right hand weakness- improved with brace and gabapentin  History of heart failure and Essential hypertension- advised pt to set an alarm to take her morning meds to see if she can achieve better control  Emotional stress- pt was not taking wellbutrin once daily instead of twice daily Will increase dose and change to once daily dosing  Follulitis- will give topical clindamycin Other orders -  buPROPion (WELLBUTRIN XL) 300 MG 24 hr tablet; Take 1 tablet (300 mg  total) by mouth daily. -     gabapentin (NEURONTIN) 300 MG capsule; Take 1 capsule (300 mg total) by mouth at bedtime. -     clindamycin (CLEOCIN-T) 1 % lotion; Apply topically 2 (two) times daily.     [place]

## 2018-01-15 NOTE — Patient Instructions (Addendum)
IF you received an x-ray today, you will receive an invoice from [facility]. Please contact [facility] at [phone] with questions or concerns regarding your invoice.   IF you received labwork today, you will receive an invoice from [facility]. Please contact [facility] at [phone] with questions or concerns regarding your invoice.   Our billing staff will not be able to assist you with questions regarding bills from these companies.  You will be contacted with the lab results as soon as they are available. The fastest way to get your results is to activate your My Chart account. Instructions are located on the last page of this paperwork. If you have not heard from [place] regarding the results in 2 weeks, please contact this office.     Folliculitis Folliculitis is inflammation of the hair follicles. Folliculitis most commonly occurs on the scalp, thighs, legs, back, and buttocks. However, it can occur anywhere on the body. What are the causes? This condition may be caused by:  A bacterial infection (common).  A fungal infection.  A viral infection.  Coming into contact with certain chemicals, especially oils and tars.  Shaving or waxing.  Applying greasy ointments or creams to your skin often.  Long-lasting folliculitis and folliculitis that keeps coming back can be caused by bacteria that live in the nostrils. What increases the risk? This condition is more likely to develop in people with:  A weakened immune system.  Diabetes.  Obesity.  What are the signs or symptoms? Symptoms of this condition include:  Redness.  Soreness.  Swelling.  Itching.  Small white or yellow, pus-filled, itchy spots (pustules) that appear over a reddened area. If there is an infection that goes deep into the follicle, these may develop into a boil (furuncle).  A group of closely packed boils (carbuncle). These tend to form in hairy, sweaty areas of the  body.  How is this diagnosed? This condition is diagnosed with a skin exam. To find what is causing the condition, your health care provider may take a sample of one of the pustules or boils for testing. How is this treated? This condition may be treated by:  Applying warm compresses to the affected areas.  Taking an antibiotic medicine or applying an antibiotic medicine to the skin.  Applying or bathing with an antiseptic solution.  Taking an over-the-counter medicine to help with itching.  Having a procedure to drain any pustules or boils. This may be done if a pustule or boil contains a lot of pus or fluid.  Laser hair removal. This may be done to treat long-lasting folliculitis.  Follow these instructions at home:  If directed, apply heat to the affected area as often as told by your health care provider. Use the heat source that your health care provider recommends, such as a moist heat pack or a heating pad. ? Place a towel between your skin and the heat source. ? Leave the heat on for 20-30 minutes. ? Remove the heat if your skin turns bright red. This is especially important if you are unable to feel pain, heat, or cold. You may have a greater risk of getting burned.  If you were prescribed an antibiotic medicine, use it as told by your health care provider. Do not stop using the antibiotic even if you start to feel better.  Take over-the-counter and prescription medicines only as told by your health care provider.  Do not shave irritated skin.  Keep all follow-up visits  as told by your health care provider. This is important. Get help right away if:  You have more redness, swelling, or pain in the affected area.  Red streaks are spreading from the affected area.  You have a fever. This information is not intended to replace advice given to you by your health care provider. Make sure you discuss any questions you have with your health care provider. Document Released:  08/06/2001 Document Revised: 12/16/2015 Document Reviewed: 03/18/2015 Elsevier Interactive Patient Education  2018 [employer].

## 2018-01-16 ENCOUNTER — Encounter: Payer: Self-pay | Admitting: Family Medicine

## 2018-01-23 ENCOUNTER — Telehealth: Payer: Self-pay | Admitting: Family Medicine

## 2018-01-23 NOTE — Telephone Encounter (Signed)
Left a voicemail in regards to her appt she has with Dr. Stallings on 02/17/2018. The provider is leaving early and needs to be rescheduled. °

## 2018-01-30 ENCOUNTER — Encounter: Payer: Self-pay | Admitting: Internal Medicine

## 2018-02-12 ENCOUNTER — Ambulatory Visit: Payer: 59 | Admitting: Family Medicine

## 2018-02-12 ENCOUNTER — Encounter: Payer: Self-pay | Admitting: Family Medicine

## 2018-02-12 ENCOUNTER — Other Ambulatory Visit: Payer: Self-pay

## 2018-02-12 VITALS — BP 165/82 | HR 86 | Temp 97.9°F | Ht 64.0 in | Wt 255.2 lb

## 2018-02-12 DIAGNOSIS — J0140 Acute pansinusitis, unspecified: Secondary | ICD-10-CM | POA: Diagnosis not present

## 2018-02-12 MED ORDER — FLUCONAZOLE 150 MG PO TABS: 150.0000 mg | ORAL_TABLET | Freq: Once | ORAL | 0 refills | Status: AC

## 2018-02-12 MED ORDER — AMOXICILLIN-POT CLAVULANATE 875-125 MG PO TABS: 1.0000 | ORAL_TABLET | Freq: Two times a day (BID) | ORAL | 0 refills | Status: DC

## 2018-02-12 NOTE — Progress Notes (Signed)
9/4/20193:47 PM  Tammy Hogan Oct 14, 1975, 42 y.o. female [id number]  Chief Complaint  Patient presents with  . Cough    Coughing for 1 wk with cold sweats. Was given prednisone by work place physician    HPI:   Patient is a 42 y.o. female with past medical history significant for CHF, CAD, HTN, GERD who presents today for cough  A week ago started with cold sx Cont with sign nasal congestion with thick yellow mucous Cough has persisted, most of the time dry A week ago seen at work and was given pred taper - gave her thrush, has nyastatin swish and swallow at home Now having burning pain of lungs with coughing  Having sweats + sinus pain No SOB or swelling Using albuterol but does not help   Fall Risk  02/12/2018 01/15/2018 09/03/2017 05/15/2017 04/04/2017  Falls in the past year? No No No No No     Depression screen [facility] 2/9 02/12/2018 01/15/2018 09/03/2017  Decreased Interest 0 0 0  Down, Depressed, Hopeless 0 0 0  PHQ - 2 Score 0 0 0  Altered sleeping - - -  Tired, decreased energy - - -  Change in appetite - - -  Feeling bad or failure about yourself  - - -  Trouble concentrating - - -  Moving slowly or fidgety/restless - - -  Suicidal thoughts - - -  PHQ-9 Score - - -  Difficult doing work/chores - - -    Allergies  Allergen Reactions  . Percocet [Oxycodone-Acetaminophen] Nausea Only    Prior to Admission medications   Medication Sig Start Date End Date Taking? Authorizing Provider  amLODipine (NORVASC) 10 MG tablet TAKE 1 TABLET BY MOUTH  DAILY 12/05/17  Yes Jettie Booze, MD  aspirin 81 MG tablet Take 81 mg by mouth daily.   Yes [provider]  atorvastatin (LIPITOR) 10 MG tablet TAKE 1 TABLET BY MOUTH  DAILY 10/21/17  Yes Jettie Booze, MD  buPROPion (WELLBUTRIN XL) 300 MG 24 hr tablet Take 1 tablet (300 mg total) by mouth daily. 01/15/18  Yes Stallings, Zoe A, MD  clindamycin (CLEOCIN-T) 1 % lotion Apply topically 2 (two) times daily.  01/15/18  Yes Delia Chimes A, MD  furosemide (LASIX) 20 MG tablet TAKE 1 TABLET BY MOUTH  EVERY DAY 12/05/17  Yes Jettie Booze, MD  gabapentin (NEURONTIN) 300 MG capsule Take 1 capsule (300 mg total) by mouth at bedtime. 01/15/18  Yes Forrest Moron, MD  levonorgestrel (MIRENA) 20 MCG/24HR IUD 1 each by Intrauterine route once.   Yes [provider]  lisinopril (PRINIVIL,ZESTRIL) 20 MG tablet TAKE 1 TABLET BY MOUTH  DAILY 12/05/17  Yes Jettie Booze, MD  nitroGLYCERIN (NITROSTAT) 0.4 MG SL tablet Place 1 tablet (0.4 mg total) under the tongue every 5 (five) minutes as needed for chest pain. 05/30/16  Yes Jettie Booze, MD  pantoprazole (PROTONIX) 40 MG tablet Take 1 tablet (40 mg total) by mouth daily. 03/25/17  Yes Wardell Honour, MD  psyllium (METAMUCIL SMOOTH TEXTURE) 28 % packet Take 1 packet by mouth 2 (two) times daily. 01/30/17  Yes Nche, Charlene Brooke, NP  carvedilol (COREG) 12.5 MG tablet Take 1 tablet (12.5 mg total) by mouth 2 (two) times daily. 07/23/17 10/21/17  Jettie Booze, MD  predniSONE (DELTASONE) 10 MG tablet TAKE 4 TABLETS BY MOUTH ONCE DAILY IN THE MORNING FOR 2 DAYS THEN 3 ONCE DAILY IN THE MORNING FOR 2 DAYS  THEN 2 ONCE DAILY IN THE MORNING FO 02/04/18   [provider]    Past Medical History:  Diagnosis Date  . Anginal pain ([place])   . [place], [place]  . Depression   . GERD (gastroesophageal reflux disease)   . History of cardiac cath 07/23/2012   Dr Irish Lack  . History of echocardiogram    Cardiomyopathy-reduced EF 35-40% with wall m otion abnormality concerning for ischemia. Hypokinesis of the anterior and anteroseptal walls to the apex  . HPV (human papilloma virus) infection   . Hyperlipidemia   . Hypertension   . PCOS (polycystic ovarian syndrome)   . PPD positive, treated 2008   [place], [place]    Past Surgical History:  Procedure Laterality Date  . CARDIAC CATHETERIZATION  feb, 2014   Dr Irish Lack  . ORIF  ANKLE FRACTURE Left 07/10/2013   Procedure: OPEN REDUCTION INTERNAL FIXATION (ORIF) LEFT BIMALLEOLAR ANKLE FRACTURE;  Surgeon: Mcarthur Rossetti, MD;  Location: [facility];  Service: Orthopedics;  Laterality: Left;    Social History   Tobacco Use  . Smoking status: Current Every Day Smoker    Packs/day: 0.50    Years: 16.00    Pack years: 8.00  . Smokeless tobacco: Never Used  Substance Use Topics  . Alcohol use: No    Alcohol/week: 0.0 standard drinks    Family History  Problem Relation Age of Onset  . Hypertension Mother   . Hyperlipidemia Mother   . Heart failure Paternal Grandmother   . Hypertension Paternal Grandmother   . Diabetes Paternal Grandmother   . Glaucoma Paternal Grandmother   . Other Father        drug overdose  . Drug abuse Father        overdose  . Hypertension Sister   . Cancer Maternal Grandmother   . Cirrhosis Paternal Grandfather   . Alcohol abuse Paternal Grandfather   . Cancer Other 78       breast, cervical and colon cancer    ROS Per hpi  OBJECTIVE:  Blood pressure (!) 165/82, pulse 86, temperature 97.9 F (36.6 C), temperature source Oral, height 5\' 4"  (1.626 m), weight 255 lb 3.2 oz (115.8 kg), SpO2 99 %. Body mass index is 43.8 kg/m.   Physical Exam  Constitutional: She is oriented to person, place, and time. She appears well-developed and well-nourished.  HENT:  Head: Normocephalic and atraumatic.  Right Ear: Hearing, tympanic membrane, external ear and ear canal normal.  Left Ear: Hearing, tympanic membrane, external ear and ear canal normal.  Nose: Mucosal edema and rhinorrhea present. Right sinus exhibits maxillary sinus tenderness and frontal sinus tenderness. Left sinus exhibits maxillary sinus tenderness and frontal sinus tenderness.  Mouth/Throat: Oropharynx is clear and moist.  Thrush on tongue  Eyes: Pupils are equal, round, and reactive to light. Conjunctivae and EOM are normal.  Neck: Neck supple.  Cardiovascular:  Normal rate, regular rhythm and normal heart sounds. Exam reveals no gallop and no friction rub.  No murmur heard. Pulmonary/Chest: Effort normal and breath sounds normal. She has no wheezes. She has no rales.  Musculoskeletal: She exhibits no edema.  Lymphadenopathy:    She has no cervical adenopathy.  Neurological: She is alert and oriented to person, place, and time.  Skin: Skin is warm and dry.  Psychiatric: She has a normal mood and affect.  Nursing note and vitals reviewed.      ASSESSMENT and PLAN  1. Acute non-recurrent pansinusitis Discussed  supportive measures, new meds r/se/b and RTC precautions. Patient educational handout given.  Other orders - amoxicillin-clavulanate (AUGMENTIN) 875-125 MG tablet; Take 1 tablet by mouth 2 (two) times daily. - fluconazole (DIFLUCAN) 150 MG tablet; Take 1 tablet (150 mg total) by mouth once for 1 dose.  Return if symptoms worsen or fail to improve.    Rutherford Guys, MD [facility] [place], [place] [address] Ph.  [phone] Fax [phone]

## 2018-02-12 NOTE — Patient Instructions (Signed)
° ° ° °  If you have lab work done today you will be contacted with your lab results within the next 2 weeks.  If you have not heard from us then please contact us. The fastest way to get your results is to register for My Chart. ° ° °IF you received an x-ray today, you will receive an invoice from [facility]. Please contact [facility] at [phone] with questions or concerns regarding your invoice.  ° °IF you received labwork today, you will receive an invoice from [facility]. Please contact [facility] at [phone] with questions or concerns regarding your invoice.  ° °Our billing staff will not be able to assist you with questions regarding bills from these companies. ° °You will be contacted with the lab results as soon as they are available. The fastest way to get your results is to activate your My Chart account. Instructions are located on the last page of this paperwork. If you have not heard from us regarding the results in 2 weeks, please contact this office. °  ° ° ° °

## 2018-02-17 ENCOUNTER — Ambulatory Visit: Payer: 59 | Admitting: Family Medicine

## 2018-03-18 ENCOUNTER — Encounter: Payer: Self-pay | Admitting: Internal Medicine

## 2018-03-18 ENCOUNTER — Ambulatory Visit: Payer: 59 | Admitting: Internal Medicine

## 2018-03-18 VITALS — BP 124/76 | HR 80 | Ht 63.5 in | Wt 259.5 lb

## 2018-03-18 DIAGNOSIS — Z720 Tobacco use: Secondary | ICD-10-CM

## 2018-03-18 DIAGNOSIS — K219 Gastro-esophageal reflux disease without esophagitis: Secondary | ICD-10-CM | POA: Diagnosis not present

## 2018-03-18 DIAGNOSIS — R1013 Epigastric pain: Secondary | ICD-10-CM | POA: Diagnosis not present

## 2018-03-18 DIAGNOSIS — R142 Eructation: Secondary | ICD-10-CM

## 2018-03-18 NOTE — Progress Notes (Signed)
HISTORY OF PRESENT ILLNESS:  Tammy Hogan is a pleasant, 42 y.o. 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  Morbidly obese.  Prior history of cardiomyopathy with most recent ejection fraction normal at 65 to 70%.  She is sent today by Dr. Nolon Rod regarding chronic GERD and the chief complaint of excessive belching.  Patient tells me that she has had GERD for at least 10 years.  Previously on AcipHex.  Over the past year problems with chronic belching.  She was changed to Protonix.  PPI therapy helps classic reflux symptoms such as pyrosis.  However, no effect on belching.  Her weight has been stable though she is morbidly obese.  She is a chronic smoker and has a history of anxiety.  She does have occasional nausea and regurgitation.  No abdominal pain.  She does have occasional constipation and a history of anal fissure.  No prior GI evaluations.  GI review of systems otherwise negative.  Review of relevant radiology includes echocardiogram 2016 with ejection fraction 65 to 70%.  Review of outside laboratories includes comprehensive metabolic panel and CBC from March 11, 2017.  These were unremarkable.  Hemoglobin 12.3.  REVIEW OF SYSTEMS:  All non-GI ROS negative unless otherwise stated in the HPI except for anxiety, cough, fatigue, sleeping problems  Past Medical History:  Diagnosis Date  . Anal fissure   . Anginal pain ([place])   . [place], [place]  . Depression   . Esophageal stricture   . GERD (gastroesophageal reflux disease)   . History of cardiac cath 07/23/2012   Dr Irish Lack  . History of echocardiogram    Cardiomyopathy-reduced EF 35-40% with wall m otion abnormality concerning for ischemia. Hypokinesis of the anterior and anteroseptal walls to the apex  . HPV (human papilloma virus) infection   . Hyperlipidemia   . Hypertension   . PCOS (polycystic ovarian  syndrome)    pt denies  . PPD positive, treated 2008   [place], [place]    Past Surgical History:  Procedure Laterality Date  . CARDIAC CATHETERIZATION  feb, 2014   Dr Irish Lack  . ORIF ANKLE FRACTURE Left 07/10/2013   Procedure: OPEN REDUCTION INTERNAL FIXATION (ORIF) LEFT BIMALLEOLAR ANKLE FRACTURE;  Surgeon: Mcarthur Rossetti, MD;  Location: [facility];  Service: Orthopedics;  Laterality: Left;    Social History Rasheida Broden  reports that she has been smoking. She has a 8.00 pack-year smoking history. She has never used smokeless tobacco. She reports that she drinks alcohol. She reports that she does not use drugs.  family history includes Alcohol abuse in her paternal grandfather; Cancer in her maternal grandmother; Cancer (age of onset: 33) in her other; Cirrhosis in her paternal grandfather; Diabetes in her paternal grandmother; Drug abuse in her father; Glaucoma in her paternal grandmother; Heart failure in her paternal grandmother; Hyperlipidemia in her mother; Hypertension in her mother, paternal grandmother, and sister; Other in her father.  Allergies  Allergen Reactions  . Percocet [Oxycodone-Acetaminophen] Nausea Only       PHYSICAL EXAMINATION: Vital signs: BP 124/76 (BP Location: Left Arm, Patient Position: Sitting, Cuff Size: Large)   Pulse 80   Ht 5' 3.5" (1.613 m) Comment: height measured without shoes  Wt 259 lb 8 oz (117.7 kg)   BMI 45.25 kg/m   Constitutional: Pleasant, generally well-appearing, no acute distress Psychiatric: alert and oriented x3, cooperative Eyes: extraocular  movements intact, anicteric, conjunctiva pink Mouth: oral pharynx moist, no lesions.  No thrush Neck: supple no lymphadenopathy Cardiovascular: heart regular rate and rhythm, no murmur Lungs: clear to auscultation bilaterally Abdomen: soft obese, nontender, nondistended, no obvious ascites, no peritoneal signs, normal bowel sounds, no organomegaly Rectal: Omitted Extremities: no  clubbing, cyanosis, or lower extremity edema bilaterally Skin: no lesions on visible extremities Neuro: No focal deficits.  Cranial nerves intact  ASSESSMENT:  1.  Chronic GERD.  Classic symptoms controlled with PPI 2.  Dyspepsia with chronic belching.  Common etiologies for chronic belching include aerophagia, particularly with anxiety or smoking 3.  Morbid obesity with BMI 45.25 4.  Tobacco abuse   PLAN:  1.  Reflux precautions with attention to weight loss.  Strictly advised 2.  Stop smoking.  Strictly advised 3.  Upper endoscopy to evaluate chronic GERD and dyspepsia.  The patient is high risk given her body habitus.The nature of the procedure, as well as the risks, benefits, and alternatives were carefully and thoroughly reviewed with the patient. Ample time for discussion and questions allowed. The patient understood, was satisfied, and agreed to proceed. 4.  Ongoing general medical care with Dr. Nolon Rod  A copy of this consultation note has been sent to Dr. Nolon Rod

## 2018-03-18 NOTE — Patient Instructions (Signed)

## 2018-03-19 DIAGNOSIS — Z30433 Encounter for removal and reinsertion of intrauterine contraceptive device: Secondary | ICD-10-CM | POA: Diagnosis not present

## 2018-03-28 ENCOUNTER — Ambulatory Visit (AMBULATORY_SURGERY_CENTER): Payer: 59 | Admitting: Internal Medicine

## 2018-03-28 ENCOUNTER — Encounter: Payer: Self-pay | Admitting: Internal Medicine

## 2018-03-28 VITALS — BP 128/71 | HR 76 | Temp 97.1°F | Resp 14 | Ht 63.5 in | Wt 259.0 lb

## 2018-03-28 DIAGNOSIS — K253 Acute gastric ulcer without hemorrhage or perforation: Secondary | ICD-10-CM

## 2018-03-28 DIAGNOSIS — K219 Gastro-esophageal reflux disease without esophagitis: Secondary | ICD-10-CM

## 2018-03-28 DIAGNOSIS — I2510 Atherosclerotic heart disease of native coronary artery without angina pectoris: Secondary | ICD-10-CM | POA: Diagnosis not present

## 2018-03-28 DIAGNOSIS — I10 Essential (primary) hypertension: Secondary | ICD-10-CM | POA: Diagnosis not present

## 2018-03-28 MED ORDER — SODIUM CHLORIDE 0.9 % IV SOLN: 500.0000 mL | Freq: Once | INTRAVENOUS | Status: DC

## 2018-03-28 NOTE — Progress Notes (Signed)
Called to room to assist during endoscopic procedure.  Patient ID and intended procedure confirmed with present staff. Received instructions for my participation in the procedure from the performing physician.  

## 2018-03-28 NOTE — Patient Instructions (Signed)
*  per dr quit smoking, continue pantoprazole*  YOU HAD AN ENDOSCOPIC PROCEDURE TODAY AT [facility]:   Refer to the procedure report that was given to you for any specific questions about what was found during the examination.  If the procedure report does not answer your questions, please call your gastroenterologist to clarify.  If you requested that your care partner not be given the details of your procedure findings, then the procedure report has been included in a sealed envelope for you to review at your convenience later.  YOU SHOULD EXPECT: Some feelings of bloating in the abdomen. Passage of more gas than usual.  Walking can help get rid of the air that was put into your GI tract during the procedure and reduce the bloating. If you had a lower endoscopy (such as a colonoscopy or flexible sigmoidoscopy) you may notice spotting of blood in your stool or on the toilet paper. If you underwent a bowel prep for your procedure, you may not have a normal bowel movement for a few days.  Please Note:  You might notice some irritation and congestion in your nose or some drainage.  This is from the oxygen used during your procedure.  There is no need for concern and it should clear up in a day or so.  SYMPTOMS TO REPORT IMMEDIATELY:    Following upper endoscopy (EGD)  Vomiting of blood or coffee ground material  New chest pain or pain under the shoulder blades  Painful or persistently difficult swallowing  New shortness of breath  Fever of 100F or higher  Black, tarry-looking stools  For urgent or emergent issues, a gastroenterologist can be reached at any hour by calling [phone].   DIET:  We do recommend a small meal at first, but then you may proceed to your regular diet.  Drink plenty of fluids but you should avoid alcoholic beverages for 24 hours.  ACTIVITY:  You should plan to take it easy for the rest of today and you should NOT DRIVE or use heavy machinery until  tomorrow (because of the sedation medicines used during the test).    FOLLOW UP: Our staff will call the number listed on your records the next business day following your procedure to check on you and address any questions or concerns that you may have regarding the information given to you following your procedure. If we do not reach you, we will leave a message.  However, if you are feeling well and you are not experiencing any problems, there is no need to return our call.  We will assume that you have returned to your regular daily activities without incident.  If any biopsies were taken you will be contacted by phone or by letter within the next 1-3 weeks.  Please call us at [phone] if you have not heard about the biopsies in 3 weeks.    SIGNATURES/CONFIDENTIALITY: You and/or your care partner have signed paperwork which will be entered into your electronic medical record.  These signatures attest to the fact that that the information above on your After Visit Summary has been reviewed and is understood.  Full responsibility of the confidentiality of this discharge information lies with you and/or your care-partner.

## 2018-03-28 NOTE — Progress Notes (Signed)
Report to PACU, RN, vss, BBS= Clear.  

## 2018-03-28 NOTE — Op Note (Signed)
[facility] Patient Name: Tammy Hogan Procedure Date: 03/28/2018 10:51 AM MRN: [id number] Endoscopist: Docia Chuck. Henrene Pastor , MD Age: 42 Referring MD:  Date of Birth: [birth year] Gender: Female Account #: [id number] Procedure:                Upper GI endoscopy with biopsies Indications:              Dyspepsia, Esophageal reflux Medicines:                Monitored Anesthesia Care Procedure:                Pre-Anesthesia Assessment:                           - Prior to the procedure, a History and Physical                            was performed, and patient medications and                            allergies were reviewed. The patient's tolerance of                            previous anesthesia was also reviewed. The risks                            and benefits of the procedure and the sedation                            options and risks were discussed with the patient.                            All questions were answered, and informed consent                            was obtained. Prior Anticoagulants: The patient has                            taken no previous anticoagulant or antiplatelet                            agents. ASA Grade Assessment: II - A patient with                            mild systemic disease. After reviewing the risks                            and benefits, the patient was deemed in                            satisfactory condition to undergo the procedure.                           After obtaining informed consent, the endoscope was  passed under direct vision. Throughout the                            procedure, the patient's blood pressure, pulse, and                            oxygen saturations were monitored continuously. The                            Endoscope was introduced through the mouth, and                            advanced to the second part of duodenum. The upper                            GI  endoscopy was accomplished without difficulty.                            The patient tolerated the procedure well. Scope In: Scope Out: Findings:                 The esophagus was normal.                           The stomach revealed multiple antral erosions and a                            small sliding hiatal hernia.. Biopsies were taken                            with a cold forceps for Helicobacter pylori testing                            using CLOtest.                           The examined duodenum was normal.                           The cardia and gastric fundus were normal on                            retroflexion. Complications:            No immediate complications. Estimated Blood Loss:     Estimated blood loss: none. Impression:               1. GERD                           2. Antral erosions                           3. Otherwise normal exam. Recommendation:           1. Continue pantoprazole 40 mg daily  2. Reflux precautions                           3. Stop smoking                           4. Sustained weight loss                           5. Follow-up CLO biopsy and treat if positive. Docia Chuck. Henrene Pastor, MD 03/28/2018 11:16:46 AM This report has been signed electronically.

## 2018-03-31 ENCOUNTER — Telehealth: Payer: Self-pay

## 2018-03-31 LAB — HELICOBACTER PYLORI SCREEN-BIOPSY: UREASE: NEGATIVE

## 2018-03-31 NOTE — Telephone Encounter (Signed)
Second post procedure follow up call, no answer 

## 2018-03-31 NOTE — Telephone Encounter (Signed)
Attempted to reach patient for post-procedure f/u call. No answer. Left message that we will make another attempt to reach her again later today and for her to please not hesitate to call us if she has any questions/concerns regarding her care. 

## 2018-05-02 DIAGNOSIS — Z30431 Encounter for routine checking of intrauterine contraceptive device: Secondary | ICD-10-CM | POA: Diagnosis not present

## 2018-05-02 DIAGNOSIS — L732 Hidradenitis suppurativa: Secondary | ICD-10-CM | POA: Diagnosis not present

## 2018-05-11 DIAGNOSIS — M76892 Other specified enthesopathies of left lower limb, excluding foot: Secondary | ICD-10-CM | POA: Insufficient documentation

## 2018-06-16 ENCOUNTER — Ambulatory Visit (INDEPENDENT_AMBULATORY_CARE_PROVIDER_SITE_OTHER): Payer: Self-pay

## 2018-06-16 ENCOUNTER — Encounter (INDEPENDENT_AMBULATORY_CARE_PROVIDER_SITE_OTHER): Payer: Self-pay | Admitting: Physician Assistant

## 2018-06-16 ENCOUNTER — Other Ambulatory Visit: Payer: Self-pay | Admitting: Family Medicine

## 2018-06-16 ENCOUNTER — Ambulatory Visit (INDEPENDENT_AMBULATORY_CARE_PROVIDER_SITE_OTHER): Payer: 59 | Admitting: Physician Assistant

## 2018-06-16 DIAGNOSIS — M25562 Pain in left knee: Secondary | ICD-10-CM | POA: Diagnosis not present

## 2018-06-16 DIAGNOSIS — M25572 Pain in left ankle and joints of left foot: Secondary | ICD-10-CM

## 2018-06-16 MED ORDER — METHYLPREDNISOLONE 4 MG PO TABS: ORAL_TABLET | ORAL | 0 refills | Status: DC

## 2018-06-16 NOTE — Progress Notes (Signed)
Office Visit Note   Patient: Tammy Hogan           Date of Birth: [birth year]           MRN: [id number] Visit Date: 06/16/2018              Requested by: Forrest Moron, MD [place], [place] [address] PCP: Forrest Moron, MD   Assessment & Plan: Visit Diagnoses:  1. Left knee pain, unspecified chronicity   2. Pain in left ankle and joints of left foot     Plan: We will send her to physical therapy for gastrocsoleus stretching, range of motion of the left ankle, quad strengthening, modalities and home exercise program.  Placed on a Medrol Dosepak which she will take no anti-inflammatories while on this.  She is given 916 2 heel lifts placed in both shoes which she will wear for the next 2 weeks.  She is advised to change her shoes out daily.  Also should benefit from wearing a shoe with a slight heel versus flat shoes and avoid open back shoes.  Follow-Up Instructions: Return in about 4 weeks (around 07/14/2018).   Orders:  Orders Placed This Encounter  Procedures  . XR KNEE 3 VIEW LEFT  . XR Ankle Complete Left   Meds ordered this encounter  Medications  . methylPREDNISolone (MEDROL) 4 MG tablet    Sig: Take as directed    Dispense:  21 tablet    Refill:  0      Procedures: No procedures performed   Clinical Data: No additional findings.   Subjective: Chief Complaint  Patient presents with  . Left Knee - Pain, Follow-up  . Left Ankle - Pain  . Knee Pain    Pt stated painful especially when bending for 2 months  . Ankle Pain    surgery 2015 having numbness, painful, swollen 1 year ago.    HPI Tammy Hogan is a 43 year old female well-known to our service history of left ankle bimalleolar fracture requiring ORIF in 2015.  She has been having increased pain in the ankle over the last year no known injury.  Pain she points to the back of the ankle the medial portion of the ankle.  She notes some swelling ankle she denies any mechanical symptoms ankle.   Left knee pain anterior aspect of the knee occasional numbness in the knee whenever she is sitting crosslegged.  No mechanical symptoms.  She has pain with prolonged sitting and bending of the knee.  She is tried ibuprofen for both these which helps some. Review of Systems See HPI otherwise negative  Objective: Vital Signs: There were no vitals taken for this visit.  Physical Exam Constitutional:      Appearance: She is obese. She is not ill-appearing or diaphoretic.  Pulmonary:     Effort: Pulmonary effort is normal.  Neurological:     Mental Status: She is alert and oriented to person, place, and time.  Psychiatric:        Mood and Affect: Mood normal.     Ortho Exam Left Achilles she has tenderness over the mid to Achilles insertion.  No deficit.  Thompson test is negative bilaterally.  She has 5 out of 5 strength with inversion eversion of the left ankle against resistance.  Decreased range of motion with dorsiflexion plantarflexion of the ankle compared to the right.  Surgical incisions are well-healed.  Nontender remainder ankle foot on the left. Bilateral knees good range of motion.  Slight hyperextension of both knees.  Tenderness over the lateral patella tibial tendon.  No tenderness over the medial lateral joint line no instability valgus varus stressing of either knee.  McMurray's negative bilaterally.  No abnormal warmth erythema or effusion of either knee. Specialty Comments:  No specialty comments available.  Imaging: Xr Ankle Complete Left  Result Date: 06/16/2018 Left ankle 3 views: Talus well located within the ankle mortise.  No diastases.  No hardware failure.  No significant arthritic changes throughout the ankle joint no obvious osteochondral lesion of the talus.  Xr Knee 3 View Left  Result Date: 06/16/2018 Left knee AP and lateral views: Mild narrowing of the medial compartment left knee.  Otherwise knee is well-preserved.  No acute fractures no bony  abnormalities.    PMFS History: Patient Active Problem List   Diagnosis Date Noted  . Chemical dermatitis 03/15/2017  . Scalp pain 03/15/2017  . Itching 03/15/2017  . Hair loss 11/07/2016  . Gastroesophageal reflux disease 06/20/2016  . Mixed hyperlipidemia 05/30/2016  . Cervicalgia 05/21/2016  . Generalized anxiety disorder 05/15/2016  . Arthralgia of multiple joints 05/15/2016  . BMI 45.0-49.9, adult ([place]) 05/15/2016  . Essential hypertension 09/07/2014  . Bimalleolar fracture of left ankle 07/10/2013  . Cardiomyopathy ([place]) 07/02/2012  . Systolic heart failure ([place]) 07/02/2012  . HTN (hypertension), malignant 07/01/2012  . Pulmonary edema 07/01/2012  . Back pain, acute 07/01/2012  . Medically noncompliant 07/01/2012  . Tobacco use disorder 07/01/2012   Past Medical History:  Diagnosis Date  . Anal fissure   . Anginal pain ([place])   . [place], [place]  . Depression   . GERD (gastroesophageal reflux disease)   . History of cardiac cath 07/23/2012   Dr Irish Lack  . History of echocardiogram    Cardiomyopathy-reduced EF 35-40% with wall m otion abnormality concerning for ischemia. Hypokinesis of the anterior and anteroseptal walls to the apex  . HPV (human papilloma virus) infection   . Hyperlipidemia   . Hypertension   . PCOS (polycystic ovarian syndrome)    pt denies  . PPD positive, treated 2008   [place], [place]    Family History  Problem Relation Age of Onset  . Hypertension Mother   . Hyperlipidemia Mother   . Heart failure Paternal Grandmother   . Hypertension Paternal Grandmother   . Diabetes Paternal Grandmother   . Glaucoma Paternal Grandmother   . Other Father        drug overdose  . Drug abuse Father        overdose  . Hypertension Sister   . Cancer Maternal Grandmother   . Cirrhosis Paternal Grandfather   . Alcohol abuse Paternal Grandfather   . Cancer Other 78       breast, cervical and colon cancer    Past Surgical History:  Procedure  Laterality Date  . CARDIAC CATHETERIZATION  feb, 2014   Dr Irish Lack  . ORIF ANKLE FRACTURE Left 07/10/2013   Procedure: OPEN REDUCTION INTERNAL FIXATION (ORIF) LEFT BIMALLEOLAR ANKLE FRACTURE;  Surgeon: Mcarthur Rossetti, MD;  Location: [facility];  Service: Orthopedics;  Laterality: Left;   Social History   Occupational History  . Not on file  Tobacco Use  . Smoking status: Current Every Day Smoker    Packs/day: 0.50    Years: 16.00    Pack years: 8.00  . Smokeless tobacco: Never Used  Substance and Sexual Activity  . Alcohol use: Yes    Alcohol/week: 0.0  standard drinks    Comment: rarely  . Drug use: No  . Sexual activity: Yes    Birth control/protection: I.U.D.

## 2018-06-16 NOTE — Telephone Encounter (Unsigned)
Copied from [place] [id number]. Topic: Quick Communication - Rx Refill/Question >> Jun 16, 2018 12:22 PM Yvette Rack wrote: Medication: pantoprazole (PROTONIX) 40 MG tablet  Has the patient contacted their pharmacy? yes   Preferred Pharmacy (with phone number or street name): [place], [place] [phone] (Phone)  [phone] (Fax)  Agent: Please be advised that RX refills may take up to 3 business days. We ask that you follow-up with your pharmacy.

## 2018-06-17 NOTE — Telephone Encounter (Signed)
Requested medication (s) are due for refill today: yes  Requested medication (s) are on the active medication list: yes  Last refill:  03/25/17 for 90 and 3 refills  Future visit scheduled: no  Notes to clinic:  Prescription expired on 03/25/18.  Requested Prescriptions  Pending Prescriptions Disp Refills   pantoprazole (PROTONIX) 40 MG tablet 90 tablet 3    Sig: Take 1 tablet (40 mg total) by mouth daily.     Gastroenterology: Proton Pump Inhibitors Passed - 06/17/2018  9:51 AM      Passed - Valid encounter within last 12 months    Recent Outpatient Visits          4 months ago Acute non-recurrent pansinusitis   [facility], Lilia Argue, MD   5 months ago Gastroesophageal reflux disease without esophagitis   [facility] at [facility], [place], [place]   9 months ago Contact dermatitis due to cosmetics, unspecified contact dermatitis type   [facility], Lilia Argue, MD   1 year ago Numbness and tingling in right hand   [facility], [facility]   1 year ago Cough   Primary Care at [place] [facility], [place]

## 2018-06-19 MED ORDER — PANTOPRAZOLE SODIUM 40 MG PO TBEC: 40.0000 mg | DELAYED_RELEASE_TABLET | Freq: Every day | ORAL | 0 refills | Status: DC

## 2018-06-23 ENCOUNTER — Ambulatory Visit: Payer: 59 | Admitting: Family Medicine

## 2018-06-23 DIAGNOSIS — L732 Hidradenitis suppurativa: Secondary | ICD-10-CM | POA: Diagnosis not present

## 2018-06-26 ENCOUNTER — Ambulatory Visit: Payer: 59 | Admitting: Emergency Medicine

## 2018-06-26 ENCOUNTER — Ambulatory Visit (INDEPENDENT_AMBULATORY_CARE_PROVIDER_SITE_OTHER): Payer: 59

## 2018-06-26 ENCOUNTER — Ambulatory Visit: Payer: 59 | Admitting: Family Medicine

## 2018-06-26 ENCOUNTER — Encounter: Payer: Self-pay | Admitting: Family Medicine

## 2018-06-26 ENCOUNTER — Other Ambulatory Visit: Payer: Self-pay

## 2018-06-26 VITALS — BP 138/92 | HR 92 | Temp 98.6°F | Resp 17 | Ht 63.5 in | Wt 267.4 lb

## 2018-06-26 DIAGNOSIS — M25611 Stiffness of right shoulder, not elsewhere classified: Secondary | ICD-10-CM | POA: Insufficient documentation

## 2018-06-26 DIAGNOSIS — M25511 Pain in right shoulder: Secondary | ICD-10-CM

## 2018-06-26 DIAGNOSIS — M47812 Spondylosis without myelopathy or radiculopathy, cervical region: Secondary | ICD-10-CM | POA: Diagnosis not present

## 2018-06-26 DIAGNOSIS — G5601 Carpal tunnel syndrome, right upper limb: Secondary | ICD-10-CM

## 2018-06-26 DIAGNOSIS — M79644 Pain in right finger(s): Secondary | ICD-10-CM | POA: Diagnosis not present

## 2018-06-26 DIAGNOSIS — I10 Essential (primary) hypertension: Secondary | ICD-10-CM

## 2018-06-26 DIAGNOSIS — K219 Gastro-esophageal reflux disease without esophagitis: Secondary | ICD-10-CM

## 2018-06-26 MED ORDER — GABAPENTIN 300 MG PO CAPS: 300.0000 mg | ORAL_CAPSULE | Freq: Every day | ORAL | 1 refills | Status: DC

## 2018-06-26 MED ORDER — PANTOPRAZOLE SODIUM 40 MG PO TBEC: 40.0000 mg | DELAYED_RELEASE_TABLET | Freq: Every day | ORAL | 3 refills | Status: DC

## 2018-06-26 MED ORDER — DICLOFENAC SODIUM 1 % TD GEL: 2.0000 g | Freq: Four times a day (QID) | TRANSDERMAL | 0 refills | Status: DC

## 2018-06-26 NOTE — Patient Instructions (Addendum)
     If you have lab work done today you will be contacted with your lab results within the next 2 weeks.  If you have not heard from [place] then please contact us. The fastest way to get your results is to register for My Chart.   IF you received an x-ray today, you will receive an invoice from [facility]. Please contact [facility] at [phone] with questions or concerns regarding your invoice.   IF you received labwork today, you will receive an invoice from [facility]. Please contact [facility] at [phone] with questions or concerns regarding your invoice.   Our billing staff will not be able to assist you with questions regarding bills from these companies.  You will be contacted with the lab results as soon as they are available. The fastest way to get your results is to activate your My Chart account. Instructions are located on the last page of this paperwork. If you have not heard from [place] regarding the results in 2 weeks, please contact this office.    CLINICAL DATA:  Acute pain of the right shoulder.  EXAM: CERVICAL SPINE - 2-3 VIEW  COMPARISON:  None.  FINDINGS: There is no evidence of cervical spine fracture or prevertebral soft tissue swelling. There is straightening of cervical spine. There is narrow intervertebral space at C4-5.  IMPRESSION: Degenerative joint changes at C4-5. Straightening of cervical spine which can be due to muscle spasm or positioning.   Electronically Signed   By: Abelardo Diesel M.D.   On: 06/26/2018 10:32  CLINICAL DATA:  Right shoulder pain  EXAM: RIGHT SHOULDER - 2+ VIEW  COMPARISON:  None.  FINDINGS: Mild degenerative changes at the right [facility] joint. Glenohumeral joint is maintained. No acute bony abnormality. Specifically, no fracture, subluxation, or dislocation.  IMPRESSION: Mild degenerative changes at the right [facility] joint. No acute bony abnormality.   Electronically Signed   By: Rolm Baptise  M.D.   On: 06/26/2018 10:31

## 2018-06-26 NOTE — Assessment & Plan Note (Signed)
Advised to stop using the carpal tunnel brace for now and to use ace wrap to alleviate pain and to continue support of the wrist.

## 2018-06-26 NOTE — Assessment & Plan Note (Signed)
Will refer to PM&R but discussed that she will probably also need to get physical therapy as well.

## 2018-06-26 NOTE — Assessment & Plan Note (Signed)
Stable on current meds, continue present meds, avoid NSAIDs as she has cardiac history  Sent in voltaren gel to local pharmacy

## 2018-06-26 NOTE — Assessment & Plan Note (Signed)
Advised pt to take tylenol arthritis for pain and to use voltaren gel Referral placed for PM&R to address her degenerative changes seen on XRAY

## 2018-06-26 NOTE — Assessment & Plan Note (Signed)
Stable on protonix, refill sent to pharmacy

## 2018-06-26 NOTE — Progress Notes (Signed)
Established Patient Office Visit  Subjective:  Patient ID: Tammy Hogan, female    DOB: Mar 14, 1976  Age: 43 y.o. MRN: [id number]  CC:  Chief Complaint  Patient presents with  . Medication Refill     protonix  . carpal tunnel in  right hand    per pt in lots of pain and unable to sleep  . Shoulder Pain    onset: Sunday, pain when turning her head,eating     HPI Tammy Hogan presents for   Medication Refill protonix refill as well as Gabapentin for her carpal tunnel and her reflux medication. She is tolerating both well.  She needs them sent to mail order  Hypertension: Patient here for follow-up of elevated blood pressure. She is not exercising and is adherent to low salt diet.  Blood pressure is well controlled at home. Cardiac symptoms none. Patient denies chest pain, chest pressure/discomfort, claudication, dyspnea and exertional chest pressure/discomfort.  Cardiovascular risk factors: hypertension, obesity (BMI >= 30 kg/m2) and sedentary lifestyle. Use of agents associated with hypertension: NSAIDS. History of target organ damage: left ventricular hypertrophy.  She states that she is taking her bp medications.  She states that she has an appt with her Cardiologist February  BP Readings from Last 3 Encounters:  06/26/18 (!) 138/92  03/28/18 128/71  03/18/18 124/76    Shoulder pain - right  4 days ago she felt like the bone was snapping in her right shoulder. She is a right handed female.  She states that she has carpal tunnel and was wearing the brace and is haivng pain in right 2nd and 3rd digit. She took 3 alleve and 3 advil this morning for pain.  She states that she cannot type due to the pain.  She reports she has stiffness and pain in the index and middle finger.   She would like evaluation for her shoulder pain which radiates into the neck and down into the biceps.    Past Medical History:  Diagnosis Date  . Anal fissure   . Anginal pain ([place])   .  [place], [place]  . Depression   . GERD (gastroesophageal reflux disease)   . History of cardiac cath 07/23/2012   Dr Irish Lack  . History of echocardiogram    Cardiomyopathy-reduced EF 35-40% with wall m otion abnormality concerning for ischemia. Hypokinesis of the anterior and anteroseptal walls to the apex  . HPV (human papilloma virus) infection   . Hyperlipidemia   . Hypertension   . PCOS (polycystic ovarian syndrome)    pt denies  . PPD positive, treated 2008   [place], [place]    Past Surgical History:  Procedure Laterality Date  . CARDIAC CATHETERIZATION  feb, 2014   Dr Irish Lack  . ORIF ANKLE FRACTURE Left 07/10/2013   Procedure: OPEN REDUCTION INTERNAL FIXATION (ORIF) LEFT BIMALLEOLAR ANKLE FRACTURE;  Surgeon: Tammy Rossetti, MD;  Location: [facility];  Service: Orthopedics;  Laterality: Left;    Family History  Problem Relation Age of Onset  . Hypertension Mother   . Hyperlipidemia Mother   . Heart failure Paternal Grandmother   . Hypertension Paternal Grandmother   . Diabetes Paternal Grandmother   . Glaucoma Paternal Grandmother   . Other Father        drug overdose  . Drug abuse Father        overdose  . Hypertension Sister   . Cancer Maternal Grandmother   . Cirrhosis Paternal Grandfather   .  Alcohol abuse Paternal Grandfather   . Cancer Other 78       breast, cervical and colon cancer    Social History   Socioeconomic History  . Marital status: Single    Spouse name: Not on file  . Number of children: 0  . Years of education: Not on file  . Highest education level: Not on file  Occupational History  . Not on file  Social Needs  . Financial resource strain: Not on file  . Food insecurity:    Worry: Not on file    Inability: Not on file  . Transportation needs:    Medical: Not on file    Non-medical: Not on file  Tobacco Use  . Smoking status: Current Every Day Smoker    Packs/day: 0.50    Years: 16.00    Pack years: 8.00  .  Smokeless tobacco: Never Used  Substance and Sexual Activity  . Alcohol use: Yes    Alcohol/week: 0.0 standard drinks    Comment: rarely  . Drug use: No  . Sexual activity: Yes    Birth control/protection: I.U.D.  Lifestyle  . Physical activity:    Days per week: Not on file    Minutes per session: Not on file  . Stress: Not on file  Relationships  . Social connections:    Talks on phone: Not on file    Gets together: Not on file    Attends religious service: Not on file    Active member of club or organization: Not on file    Attends meetings of clubs or organizations: Not on file    Relationship status: Not on file  . Intimate partner violence:    Fear of current or ex partner: Not on file    Emotionally abused: Not on file    Physically abused: Not on file    Forced sexual activity: Not on file  Other Topics Concern  . Not on file  Social History Narrative  . Not on file    Outpatient Medications Prior to Visit  Medication Sig Dispense Refill  . amLODipine (NORVASC) 10 MG tablet TAKE 1 TABLET BY MOUTH  DAILY 90 tablet 2  . aspirin 81 MG tablet Take 81 mg by mouth daily.    [name] atorvastatin (LIPITOR) 10 MG tablet TAKE 1 TABLET BY MOUTH  DAILY 90 tablet 2  . buPROPion (WELLBUTRIN XL) 300 MG 24 hr tablet Take 1 tablet (300 mg total) by mouth daily. 90 tablet 1  . clindamycin (CLEOCIN-T) 1 % lotion Apply topically 2 (two) times daily. 60 mL 0  . furosemide (LASIX) 20 MG tablet TAKE 1 TABLET BY MOUTH  EVERY DAY 90 tablet 2  . levonorgestrel (MIRENA) 20 MCG/24HR IUD 1 each by Intrauterine route once.    [name] lisinopril (PRINIVIL,ZESTRIL) 20 MG tablet TAKE 1 TABLET BY MOUTH  DAILY 90 tablet 2  . nitroGLYCERIN (NITROSTAT) 0.4 MG SL tablet Place 1 tablet (0.4 mg total) under the tongue every 5 (five) minutes as needed for chest pain. 25 tablet 3  . psyllium (METAMUCIL SMOOTH TEXTURE) 28 % packet Take 1 packet by mouth 2 (two) times daily.    [name] gabapentin (NEURONTIN) 300 MG capsule  Take 1 capsule (300 mg total) by mouth at bedtime. 90 capsule 1  . methylPREDNISolone (MEDROL) 4 MG tablet Take as directed 21 tablet 0  . pantoprazole (PROTONIX) 40 MG tablet Take 1 tablet (40 mg total) by mouth daily. 30 tablet 0  . carvedilol (  COREG) 12.5 MG tablet Take 1 tablet (12.5 mg total) by mouth 2 (two) times daily. 180 tablet 3   No facility-administered medications prior to visit.     Allergies  Allergen Reactions  . Percocet [Oxycodone-Acetaminophen] Nausea Only    ROS Review of Systems See hpi    Objective:    Physical Exam  BP (!) 138/92 (BP Location: Left Arm, Patient Position: Sitting, Cuff Size: Large)   Pulse 92   Temp 98.6 F (37 C) (Oral)   Resp 17   Ht 5' 3.5" (1.613 m)   Wt 267 lb 6.4 oz (121.3 kg)   SpO2 98%   BMI 46.62 kg/m  Wt Readings from Last 3 Encounters:  06/26/18 267 lb 6.4 oz (121.3 kg)  03/28/18 259 lb (117.5 kg)  03/18/18 259 lb 8 oz (117.7 kg)   Physical Exam  Constitutional: Oriented to person, place, and time. Appears well-developed and well-nourished.  HENT:  Head: Normocephalic and atraumatic.  Eyes: Conjunctivae and EOM are normal.  Cardiovascular: Normal rate, regular rhythm, normal heart sounds and intact distal pulses.  No murmur heard. Pulmonary/Chest: Effort normal and breath sounds normal. No stridor. No respiratory distress. Has no wheezes.  Neurological: Is alert and oriented to person, place, and time.  Skin: Skin is warm. Capillary refill takes less than 2 seconds.  Psychiatric: Has a normal mood and affect. Behavior is normal. Judgment and thought content normal.  Musculoskeletal Normal range of motion of the neck but pain with palpation of the trapezius muscle on the right with spasm Tenderness of the AC joint on the right and the biceps No tenderness over the scapula No winged scapula Internal rotation of the arm elicits tenderness  Wrist exam - nontender over the carpal bones on the right, tender over the  dorum of the hand over 2nd and third digit near the knuckle  No soft tissue swelling    CLINICAL DATA:  Acute pain of the right shoulder.  EXAM: CERVICAL SPINE - 2-3 VIEW  COMPARISON:  None.  FINDINGS: There is no evidence of cervical spine fracture or prevertebral soft tissue swelling. There is straightening of cervical spine. There is narrow intervertebral space at C4-5.  IMPRESSION: Degenerative joint changes at C4-5. Straightening of cervical spine which can be due to muscle spasm or positioning.   Electronically Signed   By: Abelardo Diesel M.D.   On: 06/26/2018 10:32  CLINICAL DATA:  Right shoulder pain  EXAM: RIGHT SHOULDER - 2+ VIEW  COMPARISON:  None.  FINDINGS: Mild degenerative changes at the right [facility] joint. Glenohumeral joint is maintained. No acute bony abnormality. Specifically, no fracture, subluxation, or dislocation.  IMPRESSION: Mild degenerative changes at the right [facility] joint. No acute bony abnormality.   Electronically Signed   By: Rolm Baptise M.D.   On: 06/26/2018 10:31   Health Maintenance Due  Topic Date Due  . HIV Screening  12/28/1989  . INFLUENZA VACCINE  01/09/2018    There are no preventive care reminders to display for this patient.  Lab Results  Component Value Date   TSH 1.260 03/11/2017   Lab Results  Component Value Date   WBC 8.1 03/11/2017   HGB 12.3 03/11/2017   HCT 38.9 03/11/2017   MCV 90 03/11/2017   PLT 431 (H) 03/11/2017   Lab Results  Component Value Date   NA 140 03/11/2017   K 4.0 03/11/2017   CO2 25 03/11/2017   GLUCOSE 78 03/11/2017   BUN 9 03/11/2017  CREATININE 0.67 03/11/2017   BILITOT 0.2 03/11/2017   ALKPHOS 97 03/11/2017   AST 16 03/11/2017   ALT 13 03/11/2017   PROT 7.1 03/11/2017   ALBUMIN 3.9 03/11/2017   CALCIUM 9.1 03/11/2017   GFR 101.16 02/16/2014   Lab Results  Component Value Date   CHOL 150 03/11/2017   Lab Results  Component Value Date   HDL 39 (L)  03/11/2017   Lab Results  Component Value Date   LDLCALC 82 03/11/2017   Lab Results  Component Value Date   TRIG 146 03/11/2017   Lab Results  Component Value Date   CHOLHDL 3.8 03/11/2017   Lab Results  Component Value Date   HGBA1C 5.1 03/11/2017      Assessment & Plan:   Problem List Items Addressed This Visit    None    Visit Diagnoses    Acute pain of right shoulder    -  Primary   Relevant Orders   DG Shoulder Right (Completed)   DG Cervical Spine 2 or 3 views (Completed)   Ambulatory referral to Physical Medicine Rehab   Decreased right shoulder range of motion       Relevant Orders   DG Shoulder Right (Completed)   DG Cervical Spine 2 or 3 views (Completed)   Ambulatory referral to Physical Medicine Rehab   Carpal tunnel syndrome of right wrist       Relevant Medications   gabapentin (NEURONTIN) 300 MG capsule   Other Relevant Orders   Ambulatory referral to Physical Medicine Rehab   Pain of finger of right hand       Relevant Orders   Ambulatory referral to Physical Medicine Rehab      Meds ordered this encounter  Medications  . gabapentin (NEURONTIN) 300 MG capsule    Sig: Take 1 capsule (300 mg total) by mouth at bedtime.    Dispense:  90 capsule    Refill:  1    Please consider 90 day supplies to promote better adherence  . pantoprazole (PROTONIX) 40 MG tablet    Sig: Take 1 tablet (40 mg total) by mouth daily.    Dispense:  90 tablet    Refill:  3    Follow-up: No follow-ups on file.    Forrest Moron, MD

## 2018-06-28 ENCOUNTER — Other Ambulatory Visit: Payer: Self-pay | Admitting: Family Medicine

## 2018-06-30 ENCOUNTER — Other Ambulatory Visit: Payer: Self-pay | Admitting: Family Medicine

## 2018-06-30 DIAGNOSIS — M7662 Achilles tendinitis, left leg: Secondary | ICD-10-CM | POA: Diagnosis not present

## 2018-06-30 DIAGNOSIS — M25562 Pain in left knee: Secondary | ICD-10-CM | POA: Diagnosis not present

## 2018-06-30 MED ORDER — BUPROPION HCL ER (XL) 300 MG PO TB24: 300.0000 mg | ORAL_TABLET | Freq: Every day | ORAL | 1 refills | Status: DC

## 2018-06-30 NOTE — Telephone Encounter (Signed)
Copied from [place]. Topic: Quick Communication - Rx Refill/Question >> Jun 30, 2018  2:18 PM Leward Quan A wrote: Medication: buPROPion (WELLBUTRIN XL) 300 MG 24 hr tablet  Has the patient contacted their pharmacy? Yes.   (Agent: If no, request that the patient contact the pharmacy for the refill.) (Agent: If yes, when and what did the pharmacy advise?)  Preferred Pharmacy (with phone number or street name): [place], [place] [phone] (Phone) [phone] (Fax)    Agent: Please be advised that RX refills may take up to 3 business days. We ask that you follow-up with your pharmacy.

## 2018-06-30 NOTE — Telephone Encounter (Signed)
Requested medication (s) are due for refill today: yes  Requested medication (s) are on the active medication list: yes  Last refill:  01/15/18 for 90 tabs and 1 refill  Future visit scheduled: no  Notes to clinic:  [employer]  06/26/2018.  Bupropion failed.  Requested Prescriptions  Pending Prescriptions Disp Refills   buPROPion (WELLBUTRIN XL) 300 MG 24 hr tablet [Pharmacy Med Name: BUPROPION  300MG   TAB  XL 24 HR] 90 tablet 1    Sig: TAKE 1 TABLET BY MOUTH  DAILY     Psychiatry: Antidepressants - bupropion Failed - 06/28/2018  1:36 PM      Failed - Last BP in normal range    BP Readings from Last 1 Encounters:  06/26/18 (!) 138/92         Passed - Valid encounter within last 6 months    Recent Outpatient Visits          4 days ago Acute pain of right shoulder   [facility] at [facility], Zoe A, MD   4 months ago Acute non-recurrent pansinusitis   [facility] at [facility], Lilia Argue, MD   5 months ago Gastroesophageal reflux disease without esophagitis   Primary Care at [facility] [place] A, MD   10 months ago Contact dermatitis due to cosmetics, unspecified contact dermatitis type   [facility] at [facility], Lilia Argue, MD   1 year ago Numbness and tingling in right hand   Primary Care at [facility], Arlie Solomons, MD

## 2018-07-08 DIAGNOSIS — L732 Hidradenitis suppurativa: Secondary | ICD-10-CM | POA: Diagnosis not present

## 2018-07-21 ENCOUNTER — Ambulatory Visit: Payer: 59

## 2018-07-29 ENCOUNTER — Ambulatory Visit: Payer: 59 | Attending: Family Medicine

## 2018-07-29 ENCOUNTER — Other Ambulatory Visit: Payer: Self-pay

## 2018-07-29 DIAGNOSIS — M25511 Pain in right shoulder: Secondary | ICD-10-CM | POA: Diagnosis not present

## 2018-07-29 DIAGNOSIS — M25611 Stiffness of right shoulder, not elsewhere classified: Secondary | ICD-10-CM

## 2018-07-29 DIAGNOSIS — G8929 Other chronic pain: Secondary | ICD-10-CM | POA: Diagnosis not present

## 2018-07-29 NOTE — Therapy (Addendum)
[place], [place], [address] Phone: [phone]   Fax:  [phone]  Physical Therapy Evaluation/Discharge  Patient Details  Name: Tammy Hogan MRN: [id number] Date of Birth: 1976/04/24 Referring Provider (PT): Tammy Chimes, MD   Encounter Date: 07/29/2018  PT End of Session - 07/29/18 1429    Visit Number  1    Number of Visits  12    Date for PT Re-Evaluation  09/05/18    Authorization Type  [employer]    PT Start Time  0215    PT Stop Time  0300    PT Time Calculation (min)  45 min    Activity Tolerance  Patient limited by pain    Behavior During Therapy  [facility] for tasks assessed/performed       Past Medical History:  Diagnosis Date  . Anal fissure   . Anginal pain ([place])   . [place], [place]  . Depression   . GERD (gastroesophageal reflux disease)   . History of cardiac cath 07/23/2012   Dr Irish Lack  . History of echocardiogram    Cardiomyopathy-reduced EF 35-40% with wall m otion abnormality concerning for ischemia. Hypokinesis of the anterior and anteroseptal walls to the apex  . HPV (human papilloma virus) infection   . Hyperlipidemia   . Hypertension   . PCOS (polycystic ovarian syndrome)    pt denies  . PPD positive, treated 2008   [place], [place]    Past Surgical History:  Procedure Laterality Date  . CARDIAC CATHETERIZATION  feb, 2014   Dr Irish Lack  . ORIF ANKLE FRACTURE Left 07/10/2013   Procedure: OPEN REDUCTION INTERNAL FIXATION (ORIF) LEFT BIMALLEOLAR ANKLE FRACTURE;  Surgeon: Mcarthur Rossetti, MD;  Location: [facility];  Service: Orthopedics;  Laterality: Left;    There were no vitals filed for this visit.   Subjective Assessment - 07/29/18 1424    Subjective  She reports chronic shoulder pain and is not able to lift /push and pull now .   This has been going on for 4 years.     Limitations  Lifting;House hold activities   work activity, lifting / pull /push    Diagnostic tests  xray : OA AC joint    Patient Stated Goals  She wants to have decr pain and use of RT shoulder    Currently in Pain?  No/denies    Pain Score  --   in past week 4/10   Pain Location  Shoulder    Pain Orientation  Right;Lateral   from mid deltoid to  trap   Pain Descriptors / Indicators  Stabbing;Aching    Pain Type  Chronic pain    Pain Onset  More than a month ago    Pain Frequency  Intermittent    Aggravating Factors   lifting /pushing /pulling/ voltarin gel    Pain Relieving Factors  rest,  aleve, tylenol arthritis,            [facility] PT Assessment - 07/29/18 0001      Assessment   Medical Diagnosis  RT shoulder pain    Referring Provider (PT)  Tammy Chimes, MD    Onset Date/Surgical Date  --   years since 2013.   Hand Dominance  Right    Next MD Visit  As needed    Prior Therapy  No      Precautions   Precautions  None      Restrictions  Weight Bearing Restrictions  No      Balance Screen   Has the patient fallen in the past 6 months  No      Prior Function   Level of Independence  Independent    Vocation Requirements  Not able to lift /push and pull at work      Cognition   Overall Cognitive Status  Within Functional Limits for tasks assessed      Observation/Other Assessments   Focus on Therapeutic Outcomes (FOTO)   40% limited      ROM / Strength   AROM / PROM / Strength  AROM;PROM;Strength      AROM   Overall AROM Comments  scapula WNL    AROM Assessment Site  Shoulder    Right/Left Shoulder  Right;Left    Right Shoulder Flexion  135 Degrees    Right Shoulder ABduction  142 Degrees    Right Shoulder Internal Rotation  70 Degrees    Right Shoulder External Rotation  90 Degrees    Right Shoulder Horizontal ABduction  5 Degrees    Right Shoulder Horizontal  ADduction  90 Degrees    Left Shoulder Flexion  150 Degrees    Left Shoulder ABduction  170 Degrees    Left Shoulder Internal Rotation  70 Degrees    Left Shoulder External  Rotation  93 Degrees    Left Shoulder Horizontal ABduction  10 Degrees    Left Shoulder Horizontal ADduction  105 Degrees      PROM   PROM Assessment Site  Shoulder    Right/Left Shoulder  Right      Strength   Overall Strength Comments  shoulders all groups normal                Objective measurements completed on examination: See above findings.              PT Education - 07/29/18 1428    Education Details  POC,   management oif kineseotape removal if skin irritted , remove by Friday nite if no irritation    Person(s) Educated  Patient    Methods  Explanation    Comprehension  Verbalized understanding       PT Short Term Goals - 07/29/18 1506      PT SHORT TERM GOAL #1   Title  She will be indpendent with initial HEP     Time  2    Period  Weeks    Status  New      PT SHORT TERM GOAL #2   Title  She will report 10-20% inmprovement in pain with use of RT arm    Time  3    Period  Weeks    Status  New      PT SHORT TERM GOAL #3   Title  RT shoulder flexion will improve to equal LT shoulder    Time  3    Period  Weeks    Status  New        PT Long Term Goals - 07/29/18 1508      PT LONG TERM GOAL #1   Title  She will be indpendent with all hEP issued.     Time  6    Period  Weeks    Status  New      PT LONG TERM GOAL #2   Title  She will report 75% decr in pain with using RT arm at home and work  Time  6    Period  Weeks    Status  New      PT LONG TERM GOAL #3   Title  Her RT shoulder AROM will equal LT.      Time  6    Period  Weeks    Status  New      PT LONG TERM GOAL #4   Title  She will be able to lift  and carry 15 pounds with RT arm with 1-2 max pain    Time  6    Period  Weeks    Status  New      PT LONG TERM GOAL #5   Title  FOTO score limitation will decr to 30% limited or less    Time  6    Period  Weeks    Status  New             Plan - 07/29/18 1502    Clinical Impression Statement  [name]  presents with chronic RT shoulder pain with progressive decreasein ability to use her RT arm for lifting /pushing and pulling at work and home.    Clinical Decision Making  Low    Rehab Potential  Good    PT Frequency  2x / week    PT Duration  6 weeks    PT Treatment/Interventions  Taping;Passive range of motion;Manual techniques;Patient/family education;Therapeutic exercise;Therapeutic activities;Moist Heat;Cryotherapy;Iontophoresis 29m/ml Dexamethasone;Ultrasound;Dry needling    PT Next Visit Plan  Assess tape , modalities, manual for pain and ROM.  HEP    Consulted and Agree with Plan of Care  Patient       Patient will benefit from skilled therapeutic intervention in order to improve the following deficits and impairments:  Pain, Decreased activity tolerance, Impaired UE functional use, Decreased range of motion, Obesity  Visit Diagnosis: Stiffness of right shoulder, not elsewhere classified  Chronic right shoulder pain     Problem List Patient Active Problem List   Diagnosis Date Noted  . Acute pain of right shoulder 06/26/2018  . Decreased right shoulder range of motion 06/26/2018  . 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 Pain of finger of right hand 06/26/2018  . Chemical dermatitis 03/15/2017  . Scalp pain 03/15/2017  . Itching 03/15/2017  . Hair loss 11/07/2016  . Gastroesophageal reflux disease 06/20/2016  . Mixed hyperlipidemia 05/30/2016  . Cervicalgia 05/21/2016  . Generalized anxiety disorder 05/15/2016  . Arthralgia of multiple joints 05/15/2016  . BMI 45.0-49.9, adult ([place] 05/15/2016  . Essential hypertension 09/07/2014  . Bimalleolar fracture of left ankle 07/10/2013  . Cardiomyopathy ([place] 07/02/2012  . Systolic heart failure ([facility] 07/02/2012  . HTN (hypertension), malignant 07/01/2012  . Pulmonary edema 07/01/2012  . Back pain, acute 07/01/2012  . Medically noncompliant 07/01/2012  . Tobacco use disorder 07/01/2012    CDarrel Hoover  PT 07/29/2018, 3:15 PM  [facility] [address]Phone: [phone]  Fax:  [phone] Name: Tammy DillonMRN: [id number] of Birth: 712-11-1977PHYSICAL THERAPY DISCHARGE SUMMARY  Visits from Start of Care: 1  Current functional level related to goals / functional outcomes: Unknown . She no showed and cancelled multiple appointments . She asked for later appointment and was given a 4:30 slot and no showed this appointment.   Remaining deficits: Unknown   Education / Equipment: NA  Plan:  Patient goals were not met. Patient is being discharged due to not returning since the last visit.  ?????  Pearson Forster PT  11/18/18

## 2018-07-31 ENCOUNTER — Other Ambulatory Visit: Payer: Self-pay | Admitting: Interventional Cardiology

## 2018-08-04 ENCOUNTER — Ambulatory Visit: Payer: 59

## 2018-08-07 ENCOUNTER — Telehealth: Payer: Self-pay | Admitting: Physical Therapy

## 2018-08-07 ENCOUNTER — Ambulatory Visit: Payer: 59

## 2018-08-07 NOTE — Telephone Encounter (Signed)
Message left reminding [name] of today's missed appointment and that her next appointment is 08/11/18 at 3:45 PM.  Clinic number also left on message.

## 2018-08-11 ENCOUNTER — Ambulatory Visit: Payer: 59

## 2018-08-12 ENCOUNTER — Ambulatory Visit: Payer: 59 | Attending: Family Medicine | Admitting: Physical Therapy

## 2018-08-12 ENCOUNTER — Ambulatory Visit: Payer: 59

## 2018-08-15 ENCOUNTER — Encounter

## 2018-08-20 ENCOUNTER — Other Ambulatory Visit: Payer: Self-pay | Admitting: Obstetrics and Gynecology

## 2018-08-20 DIAGNOSIS — Z1231 Encounter for screening mammogram for malignant neoplasm of breast: Secondary | ICD-10-CM

## 2018-08-26 DIAGNOSIS — L821 Other seborrheic keratosis: Secondary | ICD-10-CM | POA: Diagnosis not present

## 2018-08-26 DIAGNOSIS — L668 Other cicatricial alopecia: Secondary | ICD-10-CM | POA: Diagnosis not present

## 2018-08-26 DIAGNOSIS — L820 Inflamed seborrheic keratosis: Secondary | ICD-10-CM | POA: Diagnosis not present

## 2018-09-16 ENCOUNTER — Telehealth (INDEPENDENT_AMBULATORY_CARE_PROVIDER_SITE_OTHER): Payer: 59 | Admitting: Family Medicine

## 2018-09-16 DIAGNOSIS — F410 Panic disorder [episodic paroxysmal anxiety] without agoraphobia: Secondary | ICD-10-CM

## 2018-09-16 DIAGNOSIS — F329 Major depressive disorder, single episode, unspecified: Secondary | ICD-10-CM | POA: Diagnosis not present

## 2018-09-16 DIAGNOSIS — F419 Anxiety disorder, unspecified: Secondary | ICD-10-CM | POA: Diagnosis not present

## 2018-09-16 DIAGNOSIS — F430 Acute stress reaction: Secondary | ICD-10-CM | POA: Diagnosis not present

## 2018-09-16 DIAGNOSIS — F32A Depression, unspecified: Secondary | ICD-10-CM

## 2018-09-16 MED ORDER — CLONAZEPAM 0.5 MG PO TABS: 0.5000 mg | ORAL_TABLET | Freq: Every day | ORAL | 0 refills | Status: DC

## 2018-09-16 MED ORDER — BUPROPION HCL ER (XL) 300 MG PO TB24: 300.0000 mg | ORAL_TABLET | Freq: Every day | ORAL | 1 refills | Status: DC

## 2018-09-16 MED ORDER — GABAPENTIN 300 MG PO CAPS: 300.0000 mg | ORAL_CAPSULE | Freq: Every day | ORAL | 1 refills | Status: DC

## 2018-09-16 MED ORDER — PANTOPRAZOLE SODIUM 40 MG PO TBEC: 40.0000 mg | DELAYED_RELEASE_TABLET | Freq: Every day | ORAL | 3 refills | Status: DC

## 2018-09-16 NOTE — Progress Notes (Signed)
CC-anxiety and panic attack- Patient PHQ9=12 patient stated she is have a stressful time since she lost job. Will be losing  insurance in next month due to job loss. If get a rx would like to have a paper rx and if its ok with you  could she get a 90 day supply so she can have medications when the ins runs out.

## 2018-09-16 NOTE — Patient Instructions (Signed)
° ° ° °  If you have lab work done today you will be contacted with your lab results within the next 2 weeks.  If you have not heard from us then please contact us. The fastest way to get your results is to register for My Chart. ° ° °IF you received an x-ray today, you will receive an invoice from [facility]. Please contact [facility] at [phone] with questions or concerns regarding your invoice.  ° °IF you received labwork today, you will receive an invoice from [facility]. Please contact [facility] at [phone] with questions or concerns regarding your invoice.  ° °Our billing staff will not be able to assist you with questions regarding bills from these companies. ° °You will be contacted with the lab results as soon as they are available. The fastest way to get your results is to activate your My Chart account. Instructions are located on the last page of this paperwork. If you have not heard from us regarding the results in 2 weeks, please contact this office. °  ° ° ° °

## 2018-09-16 NOTE — Progress Notes (Signed)
Telemedicine Encounter- SOAP NOTE Established Patient  This telephone encounter was conducted with the patient's (or proxy's) verbal consent via audio telecommunications: yes/no: Yes Patient was instructed to have this encounter in a suitably private space; and to only have persons present to whom they give permission to participate. In addition, patient identity was confirmed by use of name plus two identifiers (DOB and address).  I discussed the limitations, risks, security and privacy concerns of performing an evaluation and management service by telephone and the availability of in person appointments. I also discussed with the patient that there may be a patient responsible charge related to this service. The patient expressed understanding and agreed to proceed.  I spent a total of TIME; 0 MIN TO 60 MIN: 20 minutes talking with the patient or their proxy.  CC: panic attacks, anxiety   Subjective   Tammy Hogan is a 43 y.o. established patient. Telephone visit today for  HPI  Patient lost her job and is working with the Software engineer of the [employer] about her termination. She reports that she has been having panic attacks States that she will continue to see her Cardiologist but the stress is getting to her and sometimes she feels like she cannot breath  Depression screen [facility] 2/9 09/16/2018 06/26/2018 02/12/2018 01/15/2018 09/03/2017  Decreased Interest 1 0 0 0 0  Down, Depressed, Hopeless 1 0 0 0 0  PHQ - 2 Score 2 0 0 0 0  Altered sleeping 2 - - - -  Tired, decreased energy 1 - - - -  Change in appetite 2 - - - -  Feeling bad or failure about yourself  2 - - - -  Trouble concentrating 2 - - - -  Moving slowly or fidgety/restless 1 - - - -  Suicidal thoughts 0 - - - -  PHQ-9 Score 12 - - - -  Difficult doing work/chores Very difficult - - - -     Patient Active Problem List   Diagnosis Date Noted  . Acute pain of right shoulder 06/26/2018  . Decreased right  shoulder range of motion 06/26/2018  . 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 Pain of finger of right hand 06/26/2018  . Chemical dermatitis 03/15/2017  . Scalp pain 03/15/2017  . Itching 03/15/2017  . Hair loss 11/07/2016  . Gastroesophageal reflux disease 06/20/2016  . Mixed hyperlipidemia 05/30/2016  . Cervicalgia 05/21/2016  . Generalized anxiety disorder 05/15/2016  . Arthralgia of multiple joints 05/15/2016  . BMI 45.0-49.9, adult ([place]) 05/15/2016  . Essential hypertension 09/07/2014  . Bimalleolar fracture of left ankle 07/10/2013  . Cardiomyopathy ([place]) 07/02/2012  . Systolic heart failure ([place]) 07/02/2012  . HTN (hypertension), malignant 07/01/2012  . Pulmonary edema 07/01/2012  . Back pain, acute 07/01/2012  . Medically noncompliant 07/01/2012  . Tobacco use disorder 07/01/2012    Past Medical History:  Diagnosis Date  . Anal fissure   . Anginal pain ([place])   . [place], [place]  . Depression   . GERD (gastroesophageal reflux disease)   . History of cardiac cath 07/23/2012   Dr Irish Lack  . History of echocardiogram    Cardiomyopathy-reduced EF 35-40% with wall m otion abnormality concerning for ischemia. Hypokinesis of the anterior and anteroseptal walls to the apex  . HPV (human papilloma virus) infection   . Hyperlipidemia   . Hypertension   . PCOS (polycystic ovarian syndrome)    pt  denies  . PPD positive, treated 2008   [place], [place]    Current Outpatient Medications  Medication Sig Dispense Refill  . amLODipine (NORVASC) 10 MG tablet TAKE 1 TABLET BY MOUTH  DAILY 90 tablet 2  . aspirin 81 MG tablet Take 81 mg by mouth daily.    [name] atorvastatin (LIPITOR) 10 MG tablet TAKE 1 TABLET BY MOUTH  DAILY 90 tablet 2  . buPROPion (WELLBUTRIN XL) 300 MG 24 hr tablet Take 1 tablet (300 mg total) by mouth daily. 90 tablet 1  . carvedilol (COREG) 12.5 MG tablet Take 1 tablet (12.5 mg total) by mouth 2 (two) times daily. 180 tablet 0  . clindamycin  (CLEOCIN-T) 1 % lotion Apply topically 2 (two) times daily. 60 mL 0  . diclofenac sodium (VOLTAREN) 1 % GEL Apply 2 g topically 4 (four) times daily. 100 g 0  . furosemide (LASIX) 20 MG tablet TAKE 1 TABLET BY MOUTH  EVERY DAY 90 tablet 2  . gabapentin (NEURONTIN) 300 MG capsule Take 1 capsule (300 mg total) by mouth at bedtime. 90 capsule 1  . levonorgestrel (MIRENA) 20 MCG/24HR IUD 1 each by Intrauterine route once.    [name] lisinopril (PRINIVIL,ZESTRIL) 20 MG tablet TAKE 1 TABLET BY MOUTH  DAILY 90 tablet 2  . pantoprazole (PROTONIX) 40 MG tablet Take 1 tablet (40 mg total) by mouth daily. 90 tablet 3  . psyllium (METAMUCIL SMOOTH TEXTURE) 28 % packet Take 1 packet by mouth 2 (two) times daily.    . clonazePAM (KLONOPIN) 0.5 MG tablet Take 1 tablet (0.5 mg total) by mouth at bedtime. As needed. 90 tablet 0  . nitroGLYCERIN (NITROSTAT) 0.4 MG SL tablet Place 1 tablet (0.4 mg total) under the tongue every 5 (five) minutes as needed for chest pain. (Patient not taking: Reported on 09/16/2018) 25 tablet 3   No current facility-administered medications for this visit.     Allergies  Allergen Reactions  . Percocet [Oxycodone-Acetaminophen] Nausea Only    Social History   Socioeconomic History  . Marital status: Single    Spouse name: Not on file  . Number of children: 0  . Years of education: Not on file  . Highest education level: Not on file  Occupational History  . Not on file  Social Needs  . Financial resource strain: Not on file  . Food insecurity:    Worry: Not on file    Inability: Not on file  . Transportation needs:    Medical: Not on file    Non-medical: Not on file  Tobacco Use  . Smoking status: Current Every Day Smoker    Packs/day: 0.50    Years: 16.00    Pack years: 8.00  . Smokeless tobacco: Never Used  Substance and Sexual Activity  . Alcohol use: Yes    Alcohol/week: 0.0 standard drinks    Comment: rarely  . Drug use: No  . Sexual activity: Yes    Birth  control/protection: I.U.D.  Lifestyle  . Physical activity:    Days per week: Not on file    Minutes per session: Not on file  . Stress: Not on file  Relationships  . Social connections:    Talks on phone: Not on file    Gets together: Not on file    Attends religious service: Not on file    Active member of club or organization: Not on file    Attends meetings of clubs or organizations: Not on file  Relationship status: Not on file  . Intimate partner violence:    Fear of current or ex partner: Not on file    Emotionally abused: Not on file    Physically abused: Not on file    Forced sexual activity: Not on file  Other Topics Concern  . Not on file  Social History Narrative  . Not on file    ROS  Objective   Vitals as reported by the patient: There were no vitals filed for this visit.  Diagnoses and all orders for this visit:  Panic attack-  Advised a trial of klonopin, discussed risks and benefits  Anxiety and depression- continue wellbutrin  Acute stress reaction- discussed support, she is already getting help from the [employer]  Other orders -     gabapentin (NEURONTIN) 300 MG capsule; Take 1 capsule (300 mg total) by mouth at bedtime. -     buPROPion (WELLBUTRIN XL) 300 MG 24 hr tablet; Take 1 tablet (300 mg total) by mouth daily. -     pantoprazole (PROTONIX) 40 MG tablet; Take 1 tablet (40 mg total) by mouth daily. -     clonazePAM (KLONOPIN) 0.5 MG tablet; Take 1 tablet (0.5 mg total) by mouth at bedtime. As needed.     I discussed the assessment and treatment plan with the patient. The patient was provided an opportunity to ask questions and all were answered. The patient agreed with the plan and demonstrated an understanding of the instructions.   The patient was advised to call back or seek an in-person evaluation if the symptoms worsen or if the condition fails to improve as anticipated.  I provided 20 minutes of non-face-to-face time during this  encounter.  Forrest Moron, MD  Primary Care at [facility]

## 2018-09-17 ENCOUNTER — Telehealth: Payer: Self-pay | Admitting: Interventional Cardiology

## 2018-09-17 NOTE — Telephone Encounter (Signed)
Virtual Visit Pre-Appointment Phone Call   TELEPHONE CALL NOTE  Tammy Hogan has been deemed a candidate for a follow-up tele-health visit to limit community exposure during the Covid-19 pandemic. I spoke with the patient via phone to ensure availability of phone/video source, confirm preferred email & phone number, and discuss instructions and expectations.  I reminded Tammy Hogan to be prepared with any vital sign and/or heart rhythm information that could potentially be obtained via home monitoring, at the time of her visit. I reminded Tammy Hogan to expect a phone call at the time of her visit if her visit.  Did the patient verbally acknowledge consent to treatment? YES  Patient agrees to VIDEO Visit via [facility] with Dr. Irish Lack on 4/9  Cleon Gustin, RN 09/17/2018 3:04 PM   DOWNLOADING THE [place], go to [employer] and type in WebEx in the search bar. [place] [employer], the blue/green circle. The app is free but as with any other app downloads, their phone may require them to verify saved payment information or Apple password. The patient does NOT have to create an account.  - If Android, ask patient to go to [employer] and type in WebEx in the search bar. [place] [employer], the blue/green circle. The app is free but as with any other app downloads, their phone may require them to verify saved payment information or Android password. The patient does NOT have to create an account.   CONSENT FOR TELE-HEALTH VISIT - PLEASE REVIEW  I hereby voluntarily request, consent and authorize [facility] and its employed or contracted physicians, physician assistants, nurse practitioners or other licensed health care professionals (the Practitioner), to provide me with telemedicine health care services (the "Services") as deemed necessary by the treating Practitioner. I acknowledge and consent to receive the  Services by the Practitioner via telemedicine. I understand that the telemedicine visit will involve communicating with the Practitioner through live audiovisual communication technology and the disclosure of certain medical information by electronic transmission. I acknowledge that I have been given the opportunity to request an in-person assessment or other available alternative prior to the telemedicine visit and am voluntarily participating in the telemedicine visit.  I understand that I have the right to withhold or withdraw my consent to the use of telemedicine in the course of my care at any time, without affecting my right to future care or treatment, and that the Practitioner or I may terminate the telemedicine visit at any time. I understand that I have the right to inspect all information obtained and/or recorded in the course of the telemedicine visit and may receive copies of available information for a reasonable fee.  I understand that some of the potential risks of receiving the Services via telemedicine include:  [name] Delay or interruption in medical evaluation due to technological equipment failure or disruption; . Information transmitted may not be sufficient (e.g. poor resolution of images) to allow for appropriate medical decision making by the Practitioner; and/or  . In rare instances, security protocols could fail, causing a breach of personal health information.  Furthermore, I acknowledge that it is my responsibility to provide information about my medical history, conditions and care that is complete and accurate to the best of my ability. I acknowledge that Practitioner's advice, recommendations, and/or decision may be based on factors not within their control, such as incomplete or inaccurate data provided by me or distortions of diagnostic images  or specimens that may result from electronic transmissions. I understand that the practice of medicine is not an exact science and that  Practitioner makes no warranties or guarantees regarding treatment outcomes. I acknowledge that I will receive a copy of this consent concurrently upon execution via email to the email address I last provided but may also request a printed copy by calling the office of [place].    I understand that my insurance will be billed for this visit.   I have read or had this consent read to me. . I understand the contents of this consent, which adequately explains the benefits and risks of the Services being provided via telemedicine.  . I have been provided ample opportunity to ask questions regarding this consent and the Services and have had my questions answered to my satisfaction. . I give my informed consent for the services to be provided through the use of telemedicine in my medical care  By participating in this telemedicine visit I agree to the above.

## 2018-09-17 NOTE — Telephone Encounter (Signed)
°*  STAT* If patient is at the pharmacy, call can be transferred to refill team.   1. Which medications need to be refilled? (please list name of each medication and dose if known)  amLODipine (NORVASC) 10 MG tablet atorvastatin (LIPITOR) 10 MG tablet carvedilol (COREG) 12.5 MG tablet furosemide (LASIX) 20 MG tablet nitroGLYCERIN (NITROSTAT) 0.4 MG SL tablet lisinopril (PRINIVIL,ZESTRIL) 20 MG tablet   2. Which pharmacy/location (including street and city if local pharmacy) is medication to be sent to? [facility] [phone] - [place], [place] - [address] AT [place]  3. Do they need a 30 day or 90 day supply? 58   Pt will be losing healthcare coverage as of 04/15. She would like Dr.  Irish Lack to refill all of her rx, because it may be some time before she will have health insurance again

## 2018-09-17 NOTE — Telephone Encounter (Signed)
Pt was wondering if her appt scheduled for 04/17 could be moved up and become a virtual visit. She is loosing her health insurance coverage on 04/15. She would like to follow up before she loses her coverage.

## 2018-09-18 ENCOUNTER — Telehealth (INDEPENDENT_AMBULATORY_CARE_PROVIDER_SITE_OTHER): Payer: 59 | Admitting: Interventional Cardiology

## 2018-09-18 ENCOUNTER — Other Ambulatory Visit: Payer: Self-pay

## 2018-09-18 ENCOUNTER — Encounter: Payer: Self-pay | Admitting: Interventional Cardiology

## 2018-09-18 DIAGNOSIS — Z6841 Body Mass Index (BMI) 40.0 and over, adult: Secondary | ICD-10-CM

## 2018-09-18 DIAGNOSIS — I10 Essential (primary) hypertension: Secondary | ICD-10-CM

## 2018-09-18 DIAGNOSIS — I119 Hypertensive heart disease without heart failure: Secondary | ICD-10-CM | POA: Diagnosis not present

## 2018-09-18 DIAGNOSIS — I428 Other cardiomyopathies: Secondary | ICD-10-CM

## 2018-09-18 MED ORDER — CARVEDILOL 12.5 MG PO TABS: 12.5000 mg | ORAL_TABLET | Freq: Two times a day (BID) | ORAL | 3 refills | Status: DC

## 2018-09-18 MED ORDER — FUROSEMIDE 20 MG PO TABS: 20.0000 mg | ORAL_TABLET | Freq: Every day | ORAL | 3 refills | Status: DC

## 2018-09-18 MED ORDER — AMLODIPINE BESYLATE 10 MG PO TABS: 10.0000 mg | ORAL_TABLET | Freq: Every day | ORAL | 3 refills | Status: DC

## 2018-09-18 MED ORDER — LISINOPRIL 20 MG PO TABS: 20.0000 mg | ORAL_TABLET | Freq: Every day | ORAL | 3 refills | Status: DC

## 2018-09-18 MED ORDER — ATORVASTATIN CALCIUM 10 MG PO TABS: 10.0000 mg | ORAL_TABLET | Freq: Every day | ORAL | 3 refills | Status: DC

## 2018-09-18 MED ORDER — NITROGLYCERIN 0.4 MG SL SUBL: 0.4000 mg | SUBLINGUAL_TABLET | SUBLINGUAL | 3 refills | Status: DC | PRN

## 2018-09-18 NOTE — Telephone Encounter (Signed)
Pt's medications were sent to pt's pharmacy as requested. Confirmation received.  

## 2018-09-18 NOTE — Progress Notes (Signed)
Virtual Visit via Video Note   This visit type was conducted due to national recommendations for restrictions regarding the COVID-19 Pandemic (e.g. social distancing) in an effort to limit this patient's exposure and mitigate transmission in our community.  Due to her co-morbid illnesses, this patient is at least at moderate risk for complications without adequate follow up.  This format is felt to be most appropriate for this patient at this time.  All issues noted in this document were discussed and addressed.  A limited physical exam was performed with this format.  Please refer to the patient's chart for her consent to telehealth for [facility].   Evaluation Performed:  Follow-up visit  Date:  09/18/2018   ID:  Tammy Hogan, DOB [birth year], MRN [id number]  Patient Location: Home  Provider Location: Home  PCP:  Forrest Moron, MD  Cardiologist:  Larae Grooms, MD  Electrophysiologist:  None   Chief Complaint:  NICM  History of Present Illness:    Tammy Hogan is a 43 y.o. female who presents via audio/video conferencing for a telehealth visit today.    She has obesity, PCOS and a nonischemic cardiomyopathy-diagnosed in 2014. She has had difficulty controlled her BP. No CAD by cath in 2/14.  She has had trouble stopping smoking.    Normal LV function noted in 2/16. She was concerned about her hair loss and that the BP meds are contributing.[name]  Recently, She was working for the city of [employer].  She enjoyed her job, but was terminated in 09/2018.  She is fighting legally for her job.  Denies : Chest pain. Dizziness. Leg edema. Nitroglycerin use. Orthopnea. Palpitations. Paroxysmal nocturnal dyspnea. Shortness of breath. Syncope.   She continues to walk.  She avoids crowds.  BP at home has not been checked as frequently.    Staying away from salt.   The patient does not have symptoms concerning for COVID-19 infection (fever, chills, cough, or new shortness of  breath).    Past Medical History:  Diagnosis Date  . Anal fissure   . Anginal pain ([place])   . [place], [place]  . Depression   . GERD (gastroesophageal reflux disease)   . History of cardiac cath 07/23/2012   Dr Irish Lack  . History of echocardiogram    Cardiomyopathy-reduced EF 35-40% with wall m otion abnormality concerning for ischemia. Hypokinesis of the anterior and anteroseptal walls to the apex  . HPV (human papilloma virus) infection   . Hyperlipidemia   . Hypertension   . PCOS (polycystic ovarian syndrome)    pt denies  . PPD positive, treated 2008   [place], [place]   Past Surgical History:  Procedure Laterality Date  . CARDIAC CATHETERIZATION  feb, 2014   Dr Irish Lack  . ORIF ANKLE FRACTURE Left 07/10/2013   Procedure: OPEN REDUCTION INTERNAL FIXATION (ORIF) LEFT BIMALLEOLAR ANKLE FRACTURE;  Surgeon: Mcarthur Rossetti, MD;  Location: [facility];  Service: Orthopedics;  Laterality: Left;     Current Meds  Medication Sig  . amLODipine (NORVASC) 10 MG tablet TAKE 1 TABLET BY MOUTH  DAILY  . aspirin 81 MG tablet Take 81 mg by mouth daily.  [name] atorvastatin (LIPITOR) 10 MG tablet TAKE 1 TABLET BY MOUTH  DAILY  . buPROPion (WELLBUTRIN XL) 300 MG 24 hr tablet Take 1 tablet (300 mg total) by mouth daily.  . carvedilol (COREG) 12.5 MG tablet Take 1 tablet (12.5 mg total) by mouth 2 (two) times daily.  [name]  clindamycin (CLEOCIN-T) 1 % lotion Apply topically 2 (two) times daily.  . clonazePAM (KLONOPIN) 0.5 MG tablet Take 1 tablet (0.5 mg total) by mouth at bedtime. As needed.  . diclofenac sodium (VOLTAREN) 1 % GEL Apply 2 g topically 4 (four) times daily.  . furosemide (LASIX) 20 MG tablet TAKE 1 TABLET BY MOUTH  EVERY DAY  . gabapentin (NEURONTIN) 300 MG capsule Take 1 capsule (300 mg total) by mouth at bedtime.  [name] levonorgestrel (MIRENA) 20 MCG/24HR IUD 1 each by Intrauterine route once.  [name] lisinopril (PRINIVIL,ZESTRIL) 20 MG tablet TAKE 1 TABLET BY MOUTH  DAILY  .  nitroGLYCERIN (NITROSTAT) 0.4 MG SL tablet Place 1 tablet (0.4 mg total) under the tongue every 5 (five) minutes as needed for chest pain.  . pantoprazole (PROTONIX) 40 MG tablet Take 1 tablet (40 mg total) by mouth daily.  . psyllium (METAMUCIL SMOOTH TEXTURE) 28 % packet Take 1 packet by mouth 2 (two) times daily.     Allergies:   Percocet [oxycodone-acetaminophen]   Social History   Tobacco Use  . Smoking status: Current Every Day Smoker    Packs/day: 0.50    Years: 16.00    Pack years: 8.00  . Smokeless tobacco: Never Used  Substance Use Topics  . Alcohol use: Yes    Alcohol/week: 0.0 standard drinks    Comment: rarely  . Drug use: No     Family Hx: The patient's family history includes Alcohol abuse in her paternal grandfather; Cancer in her maternal grandmother; Cancer (age of onset: 34) in an other family member; Cirrhosis in her paternal grandfather; Diabetes in her paternal grandmother; Drug abuse in her father; Glaucoma in her paternal grandmother; Heart failure in her paternal grandmother; Hyperlipidemia in her mother; Hypertension in her mother, paternal grandmother, and sister; Other in her father.  ROS:   Please see the history of present illness.     All other systems reviewed and are negative.   Prior CV studies:   The following studies were reviewed today:  As above  Labs/Other Tests and Data Reviewed:    EKG:  2/19 NSR  Recent Labs: No results found for requested labs within last 8760 hours.   Recent Lipid Panel Lab Results  Component Value Date/Time   CHOL 150 03/11/2017 09:50 AM   TRIG 146 03/11/2017 09:50 AM   HDL 39 (L) 03/11/2017 09:50 AM   CHOLHDL 3.8 03/11/2017 09:50 AM   CHOLHDL 5.6 07/03/2012 05:10 AM   LDLCALC 82 03/11/2017 09:50 AM    Wt Readings from Last 3 Encounters:  09/18/18 256 lb (116.1 kg)  06/26/18 267 lb 6.4 oz (121.3 kg)  03/28/18 259 lb (117.5 kg)     Objective:    Vital Signs:  BP (!) 156/98 Comment: bp machine  isn't working  Pulse 99   Ht 5' 3.5" (1.613 m)   Wt 256 lb (116.1 kg)   BMI 44.64 kg/m    Well nourished, well developed female in no acute distress. No Shortness of breath  ASSESSMENT & PLAN:    1. Nonischemic cardiomyopathy:  Resolved several years ago.  Appears euvolemic.  Following weight regularly.  2. Hypertensive heart disease: High today but has not taken meds today.  Continue check, avoid salt, regular exercise.  Will refill meds.  Typically readings in the 381O systolic.   3.  Morbid obesity: COntinue to try to work on weight loss.  Avoid carbs, and processed foods.  Eating plant based foods more.  She does  eat a fair amount of meat still.   4. Rx fro BP cuff given.   COVID-19 Education: The signs and symptoms of COVID-19 were discussed with the patient and how to seek care for testing (follow up with PCP or arrange E-visit).  The importance of social distancing was discussed today.  Time:   Today, I have spent 15 minutes with the patient with telehealth technology discussing the above problems.     Medication Adjustments/Labs and Tests Ordered: Current medicines are reviewed at length with the patient today.  Concerns regarding medicines are outlined above.  Tests Ordered: No orders of the defined types were placed in this encounter.  Medication Changes: No orders of the defined types were placed in this encounter.   Disposition:  Follow up in 1 year(s)  Signed, Larae Grooms, MD  09/18/2018 11:43 AM    [facility]

## 2018-09-18 NOTE — Patient Instructions (Signed)
Medication Instructions:  Your physician recommends that you continue on your current medications as directed. Please refer to the Current Medication list given to you today.  If you need a refill on your cardiac medications before your next appointment, please call your pharmacy.   Lab work: None Ordered  If you have labs (blood work) drawn today and your tests are completely normal, you will receive your results only by: [name] MyChart Message (if you have MyChart) OR . A paper copy in the mail If you have any lab test that is abnormal or we need to change your treatment, we will call you to review the results.  Testing/Procedures: None ordered  Follow-Up: At [facility], you and your health needs are our priority.  As part of our continuing mission to provide you with exceptional heart care, we have created designated Provider Care Teams.  These Care Teams include your primary Cardiologist (physician) and Advanced Practice Providers (APPs -  Physician Assistants and Nurse Practitioners) who all work together to provide you with the care you need, when you need it. . You will need a follow up appointment in 1 year.  Please call our office 2 months in advance to schedule this appointment.  You may see Casandra Doffing, MD or one of the following Advanced Practice Providers on your designated Care Team:   . Lyda Jester, PA-C . Dayna Dunn, PA-C . Ermalinda Barrios, PA-C  Any Other Special Instructions Will Be Listed Below (If Applicable).  MONITOR YOUR BLOOD PRESSURE AND LET us KNOW IF IT IS CONSISTENTLY HIGHER THAN 140/90  WE WILL MAIL YOU YOUR PRESCRIPTION FOR BLOOD PRESSURE MONITOR

## 2018-09-26 ENCOUNTER — Ambulatory Visit: Payer: 59 | Admitting: Interventional Cardiology

## 2018-10-10 ENCOUNTER — Ambulatory Visit: Payer: 59

## 2019-01-18 ENCOUNTER — Other Ambulatory Visit: Payer: Self-pay | Admitting: Family Medicine

## 2019-01-18 NOTE — Telephone Encounter (Signed)
Requested Prescriptions  Pending Prescriptions Disp Refills  . buPROPion (WELLBUTRIN XL) 300 MG 24 hr tablet [Pharmacy Med Name: buPROPion HCL XL 300 MG TABLET] 30 tablet 0    Sig: TAKE ONE TABLET BY MOUTH DAILY     Psychiatry: Antidepressants - bupropion Failed - 01/18/2019 11:10 AM      Failed - Last BP in normal range    BP Readings from Last 1 Encounters:  09/18/18 (!) 156/98         Failed - Valid encounter within last 6 months    Recent Outpatient Visits          6 months ago Acute pain of right shoulder   Primary Care at [facility], Zoe A, MD   11 months ago Acute non-recurrent pansinusitis   [facility] at [facility], Lilia Argue, MD   1 year ago Gastroesophageal reflux disease without esophagitis   Primary Care at [facility], [place] A, MD   1 year ago Contact dermatitis due to cosmetics, unspecified contact dermatitis type   [facility] at [facility], Lilia Argue, MD   1 year ago Numbness and tingling in right hand   Primary Care at [place], MD             Failed - Completed PHQ-2 or PHQ-9 in the last 360 days.      [name] gabapentin (NEURONTIN) 300 MG capsule [Pharmacy Med Name: GABAPENTIN 300 MG CAPSULE] 90 capsule 0    Sig: TAKE ONE CAPSULE BY MOUTH EVERY NIGHT AT BEDTIME     Neurology: Anticonvulsants - gabapentin Passed - 01/18/2019 11:10 AM      Passed - Valid encounter within last 12 months    Recent Outpatient Visits          6 months ago Acute pain of right shoulder   Primary Care at [facility], Zoe A, MD   11 months ago Acute non-recurrent pansinusitis   [facility] at [facility], Lilia Argue, MD   1 year ago Gastroesophageal reflux disease without esophagitis   Primary Care at [facility], [place] A, MD   1 year ago Contact dermatitis due to cosmetics, unspecified contact dermatitis type   [facility] at [facility], Lilia Argue, MD   1 year ago Numbness and tingling in right hand   Primary Care at [facility], Arlie Solomons, MD

## 2019-01-23 ENCOUNTER — Encounter: Payer: Self-pay | Admitting: Family Medicine

## 2019-01-23 DIAGNOSIS — I10 Essential (primary) hypertension: Secondary | ICD-10-CM

## 2019-01-25 ENCOUNTER — Encounter (HOSPITAL_COMMUNITY): Payer: Self-pay | Admitting: Emergency Medicine

## 2019-01-25 ENCOUNTER — Ambulatory Visit (HOSPITAL_COMMUNITY)
Admission: EM | Admit: 2019-01-25 | Discharge: 2019-01-25 | Disposition: A | Discharge status: Home / Self Care | Payer: Self-pay | Attending: Family Medicine | Admitting: Family Medicine

## 2019-01-25 ENCOUNTER — Other Ambulatory Visit: Payer: Self-pay

## 2019-01-25 ENCOUNTER — Telehealth: Payer: 59

## 2019-01-25 DIAGNOSIS — N61 Mastitis without abscess: Secondary | ICD-10-CM

## 2019-01-25 MED ORDER — DOXYCYCLINE HYCLATE 100 MG PO CAPS: 100.0000 mg | ORAL_CAPSULE | Freq: Two times a day (BID) | ORAL | 1 refills | Status: AC

## 2019-01-25 NOTE — ED Triage Notes (Signed)
Pt states she noticed an abscess on her L breast yesterday, hx of multiple abscesses with some drained, pt states she thinks this one is not able to be drained because it is hard and new.

## 2019-01-25 NOTE — ED Provider Notes (Signed)
[place]    CSN: [id number] Arrival date & time: 01/25/19  1650      History   Chief Complaint No chief complaint on file.   HPI Tammy Hogan is a 43 y.o. female.   HPI   History of recurrent skin infections States has had at least 5 skin abscesses Today here for infection in the left nipple, red, firm, very tender No fever, no trauma Has had a mammogram years ago but is overdue   Past Medical History:  Diagnosis Date  . Anal fissure   . Anginal pain ([place])   . [place], [place]  . Depression   . GERD (gastroesophageal reflux disease)   . History of cardiac cath 07/23/2012   Dr Irish Lack  . History of echocardiogram    Cardiomyopathy-reduced EF 35-40% with wall m otion abnormality concerning for ischemia. Hypokinesis of the anterior and anteroseptal walls to the apex  . HPV (human papilloma virus) infection   . Hyperlipidemia   . Hypertension   . PCOS (polycystic ovarian syndrome)    pt denies  . PPD positive, treated 2008   [place], [place]    Patient Active Problem List   Diagnosis Date Noted  . Acute pain of right shoulder 06/26/2018  . Decreased right shoulder range of motion 06/26/2018  . 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 Pain of finger of right hand 06/26/2018  . Chemical dermatitis 03/15/2017  . Scalp pain 03/15/2017  . Itching 03/15/2017  . Hair loss 11/07/2016  . Gastroesophageal reflux disease 06/20/2016  . Mixed hyperlipidemia 05/30/2016  . Cervicalgia 05/21/2016  . Generalized anxiety disorder 05/15/2016  . Arthralgia of multiple joints 05/15/2016  . BMI 45.0-49.9, adult ([place]) 05/15/2016  . Essential hypertension 09/07/2014  . Bimalleolar fracture of left ankle 07/10/2013  . Cardiomyopathy ([place]) 07/02/2012  . Systolic heart failure ([place]) 07/02/2012  . HTN (hypertension), malignant 07/01/2012  . Pulmonary edema 07/01/2012  . Back pain, acute 07/01/2012  . Medically noncompliant 07/01/2012  . Tobacco  use disorder 07/01/2012    Past Surgical History:  Procedure Laterality Date  . CARDIAC CATHETERIZATION  feb, 2014   Dr Irish Lack  . ORIF ANKLE FRACTURE Left 07/10/2013   Procedure: OPEN REDUCTION INTERNAL FIXATION (ORIF) LEFT BIMALLEOLAR ANKLE FRACTURE;  Surgeon: Mcarthur Rossetti, MD;  Location: [facility];  Service: Orthopedics;  Laterality: Left;    OB History   No obstetric history on file.      Home Medications    Prior to Admission medications   Medication Sig Start Date End Date Taking? Authorizing Provider  amLODipine (NORVASC) 10 MG tablet Take 1 tablet (10 mg total) by mouth daily. 09/18/18   Jettie Booze, MD  aspirin 81 MG tablet Take 81 mg by mouth daily.    [provider]  atorvastatin (LIPITOR) 10 MG tablet Take 1 tablet (10 mg total) by mouth daily. 09/18/18   Jettie Booze, MD  buPROPion (WELLBUTRIN XL) 300 MG 24 hr tablet TAKE ONE TABLET BY MOUTH DAILY 01/18/19   Delia Chimes A, MD  carvedilol (COREG) 12.5 MG tablet Take 1 tablet (12.5 mg total) by mouth 2 (two) times daily. 09/18/18   Jettie Booze, MD  clindamycin (CLEOCIN-T) 1 % lotion Apply topically 2 (two) times daily. 01/15/18   Forrest Moron, MD  clonazePAM (KLONOPIN) 0.5 MG tablet Take 1 tablet (0.5 mg total) by mouth at bedtime. As needed. 09/16/18   Forrest Moron, MD  diclofenac sodium (VOLTAREN) 1 % GEL Apply 2 g topically 4 (four) times daily. 06/26/18   Forrest Moron, MD  doxycycline (VIBRAMYCIN) 100 MG capsule Take 1 capsule (100 mg total) by mouth 2 (two) times daily for 7 days. 01/25/19 02/01/19  Raylene Everts, MD  furosemide (LASIX) 20 MG tablet Take 1 tablet (20 mg total) by mouth daily. 09/18/18   Jettie Booze, MD  gabapentin (NEURONTIN) 300 MG capsule TAKE ONE CAPSULE BY MOUTH EVERY NIGHT AT BEDTIME 01/18/19   Forrest Moron, MD  levonorgestrel (MIRENA) 20 MCG/24HR IUD 1 each by Intrauterine route once.    [provider]  lisinopril  (PRINIVIL,ZESTRIL) 20 MG tablet Take 1 tablet (20 mg total) by mouth daily. 09/18/18   Jettie Booze, MD  nitroGLYCERIN (NITROSTAT) 0.4 MG SL tablet Place 1 tablet (0.4 mg total) under the tongue every 5 (five) minutes as needed for chest pain. 09/18/18   Jettie Booze, MD  pantoprazole (PROTONIX) 40 MG tablet Take 1 tablet (40 mg total) by mouth daily. 09/16/18   Forrest Moron, MD  psyllium (METAMUCIL SMOOTH TEXTURE) 28 % packet Take 1 packet by mouth 2 (two) times daily. 01/30/17   [place], Charlene Brooke, NP    Family History Family History  Problem Relation Age of Onset  . Hypertension Mother   . Hyperlipidemia Mother   . Heart failure Paternal Grandmother   . Hypertension Paternal Grandmother   . Diabetes Paternal Grandmother   . Glaucoma Paternal Grandmother   . Other Father        drug overdose  . Drug abuse Father        overdose  . Hypertension Sister   . Cancer Maternal Grandmother   . Cirrhosis Paternal Grandfather   . Alcohol abuse Paternal Grandfather   . Cancer Other 78       breast, cervical and colon cancer    Social History Social History   Tobacco Use  . Smoking status: Current Every Day Smoker    Packs/day: 0.50    Years: 16.00    Pack years: 8.00  . Smokeless tobacco: Never Used  Substance Use Topics  . Alcohol use: Yes    Alcohol/week: 0.0 standard drinks    Comment: rarely  . Drug use: No     Allergies   Percocet [oxycodone-acetaminophen]   Review of Systems Review of Systems  Constitutional: Negative for chills and fever.  HENT: Negative for ear pain and sore throat.   Eyes: Negative for pain and visual disturbance.  Respiratory: Negative for cough and shortness of breath.   Cardiovascular: Negative for chest pain and palpitations.  Gastrointestinal: Negative for abdominal pain and vomiting.  Genitourinary: Negative for dysuria and hematuria.  Musculoskeletal: Negative for arthralgias and back pain.  Skin: Positive for color  change. Negative for rash.  Neurological: Negative for seizures and syncope.  All other systems reviewed and are negative.    Physical Exam Triage Vital Signs ED Triage Vitals  Enc Vitals Group     BP 01/25/19 1714 (!) 187/114     Pulse Rate 01/25/19 1714 90     Resp 01/25/19 1714 14     Temp 01/25/19 1714 97.8 F (36.6 C)     Temp src --      SpO2 01/25/19 1714 98 %     Weight --      Height --      Head Circumference --      Peak Flow --  Pain Score 01/25/19 1715 7     Pain Loc --      Pain Edu? --      Excl. in [place]? --    No data found.  Updated Vital Signs BP (!) 187/114   Pulse 90   Temp 97.8 F (36.6 C)   Resp 14   SpO2 98%      Physical Exam Constitutional:      General: She is not in acute distress.    Appearance: She is well-developed.  HENT:     Head: Normocephalic and atraumatic.  Eyes:     Conjunctiva/sclera: Conjunctivae normal.     Pupils: Pupils are equal, round, and reactive to light.  Neck:     Musculoskeletal: Normal range of motion.  Cardiovascular:     Rate and Rhythm: Normal rate.  Pulmonary:     Effort: Pulmonary effort is normal. No respiratory distress.  Chest:    Abdominal:     General: There is no distension.     Palpations: Abdomen is soft.  Musculoskeletal: Normal range of motion.  Skin:    General: Skin is warm and dry.  Neurological:     Mental Status: She is alert.      UC Treatments / Results  Labs (all labs ordered are listed, but only abnormal results are displayed) Labs Reviewed - No data to display  EKG   Radiology No results found.  Procedures Procedures (including critical care time)  Medications Ordered in UC Medications - No data to display  Initial Impression / Assessment and Plan / UC Course  I have reviewed the triage vital signs and the nursing notes.  Pertinent labs & imaging results that were available during my care of the patient were reviewed by me and considered in my medical  decision making (see chart for details).     This may be a routine skin infection.  I am going to give her Vibramycin, warm compresses, and have her watch the area carefully.  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  If her symptoms go away completely with antibiotics, as I believe they will, this is not to worry.  If however she has difficulty getting this to clear up or has persistent symptoms she needs to call her personal physician to consider additional follow-up/mammography Final Clinical Impressions(s) / UC Diagnoses   Final diagnoses:  Breast infection in female     Discharge Instructions     Warm compresses to area for 20 minutes every couple of hours Take antibiotic 2 times a day If your redness and pain failed to improve with antibiotics, call your primary care doctor for additional advice Make sure you are taking your blood pressure medication daily   ED Prescriptions    Medication Sig Dispense Auth. Provider   doxycycline (VIBRAMYCIN) 100 MG capsule Take 1 capsule (100 mg total) by mouth 2 (two) times daily for 7 days. 14 capsule Raylene Everts, MD     Controlled Substance Prescriptions [place] Controlled Substance Registry consulted? Not Applicable   Raylene Everts, MD 01/25/19 1746

## 2019-01-25 NOTE — Discharge Instructions (Signed)
Warm compresses to area for 20 minutes every couple of hours Take antibiotic 2 times a day If your redness and pain failed to improve with antibiotics, call your primary care doctor for additional advice Make sure you are taking your blood pressure medication daily

## 2019-01-27 NOTE — Telephone Encounter (Signed)
Called and made patient aware that an office visit is not required at this time. Made patient aware that we will need to check BMET this week with spironolactone increase. Appointment made for tomorrow. Instructed for patient to continue to monitor BP.

## 2019-01-28 ENCOUNTER — Other Ambulatory Visit: Payer: Self-pay | Admitting: *Deleted

## 2019-01-28 ENCOUNTER — Other Ambulatory Visit: Payer: Self-pay

## 2019-01-28 DIAGNOSIS — I10 Essential (primary) hypertension: Secondary | ICD-10-CM

## 2019-01-28 LAB — BASIC METABOLIC PANEL
BUN/Creatinine Ratio: 9 (ref 9–23)
BUN: 6 mg/dL (ref 6–24)
CO2: 21 mmol/L (ref 20–29)
Calcium: 9.3 mg/dL (ref 8.7–10.2)
Chloride: 100 mmol/L (ref 96–106)
Creatinine, Ser: 0.68 mg/dL (ref 0.57–1.00)
GFR calc Af Amer: 123 mL/min/{1.73_m2} (ref >59)
GFR calc non Af Amer: 107 mL/min/{1.73_m2} (ref >59)
Glucose: 103 mg/dL — ABNORMAL HIGH (ref 65–99)
Potassium: 4.3 mmol/L (ref 3.5–5.2)
Sodium: 136 mmol/L (ref 134–144)

## 2019-02-25 ENCOUNTER — Telehealth (INDEPENDENT_AMBULATORY_CARE_PROVIDER_SITE_OTHER): Payer: Self-pay | Admitting: Family Medicine

## 2019-02-25 VITALS — BP 127/75 | HR 88 | Wt 256.0 lb

## 2019-02-25 DIAGNOSIS — F329 Major depressive disorder, single episode, unspecified: Secondary | ICD-10-CM

## 2019-02-25 DIAGNOSIS — F419 Anxiety disorder, unspecified: Secondary | ICD-10-CM

## 2019-02-25 DIAGNOSIS — F32A Depression, unspecified: Secondary | ICD-10-CM

## 2019-02-25 DIAGNOSIS — F430 Acute stress reaction: Secondary | ICD-10-CM

## 2019-02-25 DIAGNOSIS — I5022 Chronic systolic (congestive) heart failure: Secondary | ICD-10-CM

## 2019-02-25 DIAGNOSIS — I10 Essential (primary) hypertension: Secondary | ICD-10-CM

## 2019-02-25 MED ORDER — BUPROPION HCL ER (XL) 300 MG PO TB24: 300.0000 mg | ORAL_TABLET | Freq: Every day | ORAL | 1 refills | Status: DC

## 2019-02-25 MED ORDER — GABAPENTIN 300 MG PO CAPS: 300.0000 mg | ORAL_CAPSULE | Freq: Every day | ORAL | 1 refills | Status: DC

## 2019-02-25 MED ORDER — PANTOPRAZOLE SODIUM 40 MG PO TBEC: 40.0000 mg | DELAYED_RELEASE_TABLET | Freq: Every day | ORAL | 3 refills | Status: DC

## 2019-02-25 NOTE — Progress Notes (Signed)
Scheduled

## 2019-02-25 NOTE — Progress Notes (Signed)
VIDEO Encounter- SOAP NOTE Established Patient  This VIDEO encounter was conducted with the patient's (or proxy's) verbal consent via audio telecommunications: yes/no: Yes Patient was instructed to have this encounter in a suitably private space; and to only have persons present to whom they give permission to participate. In addition, patient identity was confirmed by use of name plus two identifiers (DOB and address).  I discussed the limitations, risks, security and privacy concerns of performing an evaluation and management service by telephone and the availability of in person appointments. I also discussed with the patient that there may be a patient responsible charge related to this service. The patient expressed understanding and agreed to proceed.  I spent a total of TIME; 0 MIN TO 60 MIN: 25 minutes talking with the patient or their proxy.  CC: depression, med refills  Subjective   Tammy Hogan is a 43 y.o. established patient. Telephone visit today for  HPI  Depression Pt reports that she continues to have depressed moods and occasional suicidal thoughts but would not do that to her family She reports htat she takes her wellbutrin which is helping but she is just stressed not working and being in school. The reason she does not every try to commit suicide is that her boyfriend and family mean too much to her and she would not want to hurt them. She thinks things will get better while working and she starts working next week. Depression screen [facility] 2/9 02/25/2019 09/16/2018 06/26/2018 02/12/2018 01/15/2018  Decreased Interest 1 1 0 0 0  Down, Depressed, Hopeless 1 1 0 0 0  PHQ - 2 Score 2 2 0 0 0  Altered sleeping 3 2 - - -  Tired, decreased energy 1 1 - - -  Change in appetite 1 2 - - -  Feeling bad or failure about yourself  1 2 - - -  Trouble concentrating 1 2 - - -  Moving slowly or fidgety/restless - 1 - - -  Suicidal thoughts 1 0 - - -  PHQ-9 Score 10 12 - - -   Difficult doing work/chores Very difficult Very difficult - - -    She reports that her blood pressure is better She was started on spironolactone for her blood pressure and her dermatologist suggested it for hair loss and the cardiologist is now managing it since it helps her bp She denies dizziness, fatigue, sob, lower extremity edema BP Readings from Last 3 Encounters:  02/25/19 127/75  01/25/19 (!) 187/114  09/18/18 (!) 156/98     Patient Active Problem List   Diagnosis Date Noted  . Acute pain of right shoulder 06/26/2018  . Decreased right shoulder range of motion 06/26/2018  . 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 Pain of finger of right hand 06/26/2018  . Chemical dermatitis 03/15/2017  . Scalp pain 03/15/2017  . Itching 03/15/2017  . Hair loss 11/07/2016  . Gastroesophageal reflux disease 06/20/2016  . Mixed hyperlipidemia 05/30/2016  . Cervicalgia 05/21/2016  . Generalized anxiety disorder 05/15/2016  . Arthralgia of multiple joints 05/15/2016  . BMI 45.0-49.9, adult ([place]) 05/15/2016  . Essential hypertension 09/07/2014  . Bimalleolar fracture of left ankle 07/10/2013  . Cardiomyopathy ([place]) 07/02/2012  . Systolic heart failure ([place]) 07/02/2012  . HTN (hypertension), malignant 07/01/2012  . Pulmonary edema 07/01/2012  . Back pain, acute 07/01/2012  . Medically noncompliant 07/01/2012  . Tobacco use disorder 07/01/2012    Past Medical History:  Diagnosis Date  .  Anal fissure   . Anginal pain ([place])   . [place], [place]  . Depression   . GERD (gastroesophageal reflux disease)   . History of cardiac cath 07/23/2012   Dr Irish Lack  . History of echocardiogram    Cardiomyopathy-reduced EF 35-40% with wall m otion abnormality concerning for ischemia. Hypokinesis of the anterior and anteroseptal walls to the apex  . HPV (human papilloma virus) infection   . Hyperlipidemia   . Hypertension   . PCOS (polycystic ovarian syndrome)    pt denies   . PPD positive, treated 2008   [place], [place]    Current Outpatient Medications  Medication Sig Dispense Refill  . amLODipine (NORVASC) 10 MG tablet Take 1 tablet (10 mg total) by mouth daily. 90 tablet 3  . aspirin 81 MG tablet Take 81 mg by mouth daily.    [name] atorvastatin (LIPITOR) 10 MG tablet Take 1 tablet (10 mg total) by mouth daily. 90 tablet 3  . buPROPion (WELLBUTRIN XL) 300 MG 24 hr tablet Take 1 tablet (300 mg total) by mouth daily. 90 tablet 1  . carvedilol (COREG) 12.5 MG tablet Take 1 tablet (12.5 mg total) by mouth 2 (two) times daily. 180 tablet 3  . clindamycin (CLEOCIN-T) 1 % lotion Apply topically 2 (two) times daily. 60 mL 0  . clobetasol (TEMOVATE) 0.05 % external solution clobetasol 0.05 % scalp solution  APP 1 ML EXT AA D    . clonazePAM (KLONOPIN) 0.5 MG tablet Take 1 tablet (0.5 mg total) by mouth at bedtime. As needed. 90 tablet 0  . diclofenac sodium (VOLTAREN) 1 % GEL Apply 2 g topically 4 (four) times daily. 100 g 0  . furosemide (LASIX) 20 MG tablet Take 1 tablet (20 mg total) by mouth daily. 90 tablet 3  . gabapentin (NEURONTIN) 300 MG capsule Take 1 capsule (300 mg total) by mouth at bedtime. 90 capsule 1  . levonorgestrel (MIRENA) 20 MCG/24HR IUD 1 each by Intrauterine route once.    [name] lisinopril (PRINIVIL,ZESTRIL) 20 MG tablet Take 1 tablet (20 mg total) by mouth daily. 90 tablet 3  . pantoprazole (PROTONIX) 40 MG tablet Take 1 tablet (40 mg total) by mouth daily. 90 tablet 3  . psyllium (METAMUCIL SMOOTH TEXTURE) 28 % packet Take 1 packet by mouth 2 (two) times daily.    [name] spironolactone (ALDACTONE) 50 MG tablet Take 50 mg by mouth daily.    [name] albuterol (VENTOLIN HFA) 108 (90 Base) MCG/ACT inhaler Ventolin HFA 90 mcg/actuation aerosol inhaler    . nitroGLYCERIN (NITROSTAT) 0.4 MG SL tablet Place 1 tablet (0.4 mg total) under the tongue every 5 (five) minutes as needed for chest pain. 25 tablet 3   No current facility-administered medications for this visit.      Allergies  Allergen Reactions  . Percocet [Oxycodone-Acetaminophen] Nausea Only    Social History   Socioeconomic History  . Marital status: Single    Spouse name: Not on file  . Number of children: 0  . Years of education: Not on file  . Highest education level: Not on file  Occupational History  . Not on file  Social Needs  . Financial resource strain: Not on file  . Food insecurity    Worry: Not on file    Inability: Not on file  . Transportation needs    Medical: Not on file    Non-medical: Not on file  Tobacco Use  . Smoking status: Current Every  Day Smoker    Packs/day: 0.50    Years: 16.00    Pack years: 8.00  . Smokeless tobacco: Never Used  Substance and Sexual Activity  . Alcohol use: Yes    Alcohol/week: 0.0 standard drinks    Comment: rarely  . Drug use: No  . Sexual activity: Yes    Birth control/protection: I.U.D.  Lifestyle  . Physical activity    Days per week: Not on file    Minutes per session: Not on file  . Stress: Not on file  Relationships  . Social Herbalist on phone: Not on file    Gets together: Not on file    Attends religious service: Not on file    Active member of club or organization: Not on file    Attends meetings of clubs or organizations: Not on file    Relationship status: Not on file  . Intimate partner violence    Fear of current or ex partner: Not on file    Emotionally abused: Not on file    Physically abused: Not on file    Forced sexual activity: Not on file  Other Topics Concern  . Not on file  Social History Narrative  . Not on file    ROS Review of Systems  Constitutional: Negative for activity change, appetite change, chills and fever.  HENT: Negative for congestion, nosebleeds, trouble swallowing and voice change.   Respiratory: Negative for cough, shortness of breath and wheezing.   Gastrointestinal: Negative for diarrhea, nausea and vomiting.  Genitourinary: Negative for difficulty  urinating, dysuria, flank pain and hematuria.  Musculoskeletal: Negative for back pain, joint swelling and neck pain.  Neurological: Negative for dizziness, speech difficulty, light-headedness and numbness.  See HPI. All other review of systems negative.   Objective   Vitals as reported by the patient: Today's Vitals   02/25/19 1603  BP: 127/75  Pulse: 88  Weight: 256 lb (116.1 kg)   Physical Exam Constitutional:      Appearance: Normal appearance. She is obese.  HENT:     Head: Atraumatic.     Nose: Nose normal. No congestion.  Eyes:     Extraocular Movements: Extraocular movements intact.     Conjunctiva/sclera: Conjunctivae normal.  Pulmonary:     Effort: Pulmonary effort is normal.     Breath sounds: No wheezing.  Skin:    Coloration: Skin is not jaundiced or pale.     Findings: No erythema.  Neurological:     Mental Status: She is alert.  Psychiatric:        Mood and Affect: Mood normal.        Behavior: Behavior normal.        Thought Content: Thought content normal.        Judgment: Judgment normal.     Component     Latest Ref Rng & Units 01/28/2019  Glucose     65 - 99 mg/dL 103 (H)  BUN     6 - 24 mg/dL 6  Creatinine     0.57 - 1.00 mg/dL 0.68  GFR, Est Non African American     >59 mL/min/1.73 107  GFR, Est African American     >59 mL/min/1.73 123  BUN/Creatinine Ratio     9 - 23 9  Sodium     134 - 144 mmol/L 136  Potassium     3.5 - 5.2 mmol/L 4.3  Chloride     96 - 106 mmol/L 100  CO2     20 - 29 mmol/L 21  Calcium     8.7 - 10.2 mg/dL 9.3     Diagnoses and all orders for this visit:  Anxiety and depression Acute stress reaction -  Discussed resources for stress mgmt Discussed adding adjunctive medication as well as counseling Pt talks to her mother and sister often and feels well supported thus declined counseling She has a probation period at work before her health benefits kick in and she is optimistic that working will help her  mood so she will wait 90 days before making any changes She just knows that making money will help her stress levels.   Essential hypertension Chronic systolic heart failure ([place]) -   bp in good range, on multiple cardiac meds managed by Cardiology She uses goodrx savings card to help with her meds Advised pt to add exercise  Other orders -     gabapentin (NEURONTIN) 300 MG capsule; Take 1 capsule (300 mg total) by mouth at bedtime. -     buPROPion (WELLBUTRIN XL) 300 MG 24 hr tablet; Take 1 tablet (300 mg total) by mouth daily. -     pantoprazole (PROTONIX) 40 MG tablet; Take 1 tablet (40 mg total) by mouth daily.     I discussed the assessment and treatment plan with the patient. The patient was provided an opportunity to ask questions and all were answered. The patient agreed with the plan and demonstrated an understanding of the instructions.   The patient was advised to call back or seek an in-person evaluation if the symptoms worsen or if the condition fails to improve as anticipated.  I provided 25 minutes of VIDEO face-to-face time during this encounter.  Forrest Moron, MD  Primary Care at [facility] \

## 2019-04-06 ENCOUNTER — Ambulatory Visit (INDEPENDENT_AMBULATORY_CARE_PROVIDER_SITE_OTHER): Payer: Self-pay | Admitting: Family Medicine

## 2019-04-06 ENCOUNTER — Other Ambulatory Visit: Payer: Self-pay

## 2019-04-06 ENCOUNTER — Ambulatory Visit (INDEPENDENT_AMBULATORY_CARE_PROVIDER_SITE_OTHER): Payer: Self-pay

## 2019-04-06 ENCOUNTER — Encounter: Payer: Self-pay | Admitting: Family Medicine

## 2019-04-06 VITALS — BP 164/106 | HR 95 | Temp 98.1°F | Ht 64.0 in | Wt 259.0 lb

## 2019-04-06 DIAGNOSIS — M7711 Lateral epicondylitis, right elbow: Secondary | ICD-10-CM

## 2019-04-06 DIAGNOSIS — I10 Essential (primary) hypertension: Secondary | ICD-10-CM

## 2019-04-06 NOTE — Progress Notes (Signed)
Established Patient Office Visit  Subjective:  Patient ID: Tammy Hogan, female    DOB: Oct 28, 1975  Age: 43 y.o. MRN: [id number]  CC:  Chief Complaint  Patient presents with  . Wrist Pain    Pt stated Rt wrist and elbow which is worse--pain especially when typing, holding is worse. --since August    HPI Tammy Hogan presents for    Pt reports that her mood has been okay She reports that she "hasn't jumped off a building" so she is doing great. (she laughs) She took a half of the clonazepam the other day because of sadness. She denies suicidal thoughts.  Depression screen [facility] 2/9 04/06/2019 02/25/2019 09/16/2018 06/26/2018 02/12/2018  Decreased Interest 0 1 1 0 0  Down, Depressed, Hopeless 0 1 1 0 0  PHQ - 2 Score 0 2 2 0 0  Altered sleeping - 3 2 - -  Tired, decreased energy - 1 1 - -  Change in appetite - 1 2 - -  Feeling bad or failure about yourself  - 1 2 - -  Trouble concentrating - 1 2 - -  Moving slowly or fidgety/restless - - 1 - -  Suicidal thoughts - 1 0 - -  PHQ-9 Score - 10 12 - -  Difficult doing work/chores - Very difficult Very difficult - -   She reports that she is having right elbow pain She tried to lift her couch with one hand back in August 2020 and she hurt her elbow  She is wearing a tennis elbow brace which helps She states that typing or writing really hurts The pain radiates down the forearm She has trouble with her grip and holding things too long Her pain is 6/10  Hypertension: Patient here for follow-up of elevated blood pressure. She is not exercising and is adherent to low salt diet.  Blood pressure is not well controlled at home. Cardiac symptoms none. Patient denies chest pain, claudication, exertional chest pressure/discomfort, irregular heart beat and lower extremity edema.  Cardiovascular risk factors: dyslipidemia and hypertension. Use of agents associated with hypertension: none. She is currently at a new job and has some stress and  pain but that the blood pressure at home is elevated as well. She wants to talk to her heart doctor first.  BP Readings from Last 3 Encounters:  04/06/19 (!) 164/106  02/25/19 127/75  01/25/19 (!) 187/114     Past Medical History:  Diagnosis Date  . Anal fissure   . Anginal pain ([place])   . [place], [place]  . Depression   . GERD (gastroesophageal reflux disease)   . History of cardiac cath 07/23/2012   Dr Irish Lack  . History of echocardiogram    Cardiomyopathy-reduced EF 35-40% with wall m otion abnormality concerning for ischemia. Hypokinesis of the anterior and anteroseptal walls to the apex  . HPV (human papilloma virus) infection   . Hyperlipidemia   . Hypertension   . PCOS (polycystic ovarian syndrome)    pt denies  . PPD positive, treated 2008   [place], [place]    Past Surgical History:  Procedure Laterality Date  . CARDIAC CATHETERIZATION  feb, 2014   Dr Irish Lack  . ORIF ANKLE FRACTURE Left 07/10/2013   Procedure: OPEN REDUCTION INTERNAL FIXATION (ORIF) LEFT BIMALLEOLAR ANKLE FRACTURE;  Surgeon: Mcarthur Rossetti, MD;  Location: [facility];  Service: Orthopedics;  Laterality: Left;    Family History  Problem Relation Age of Onset  .  Hypertension Mother   . Hyperlipidemia Mother   . Heart failure Paternal Grandmother   . Hypertension Paternal Grandmother   . Diabetes Paternal Grandmother   . Glaucoma Paternal Grandmother   . Other Father        drug overdose  . Drug abuse Father        overdose  . Hypertension Sister   . Cancer Maternal Grandmother   . Cirrhosis Paternal Grandfather   . Alcohol abuse Paternal Grandfather   . Cancer Other 78       breast, cervical and colon cancer    Social History   Socioeconomic History  . Marital status: Single    Spouse name: Not on file  . Number of children: 0  . Years of education: Not on file  . Highest education level: Not on file  Occupational History  . Not on file  Social Needs  . Financial  resource strain: Not on file  . Food insecurity    Worry: Not on file    Inability: Not on file  . Transportation needs    Medical: Not on file    Non-medical: Not on file  Tobacco Use  . Smoking status: Current Every Day Smoker    Packs/day: 0.50    Years: 16.00    Pack years: 8.00  . Smokeless tobacco: Never Used  Substance and Sexual Activity  . Alcohol use: Yes    Alcohol/week: 0.0 standard drinks    Comment: rarely  . Drug use: No  . Sexual activity: Yes    Birth control/protection: I.U.D.  Lifestyle  . Physical activity    Days per week: Not on file    Minutes per session: Not on file  . Stress: Not on file  Relationships  . Social Herbalist on phone: Not on file    Gets together: Not on file    Attends religious service: Not on file    Active member of club or organization: Not on file    Attends meetings of clubs or organizations: Not on file    Relationship status: Not on file  . Intimate partner violence    Fear of current or ex partner: Not on file    Emotionally abused: Not on file    Physically abused: Not on file    Forced sexual activity: Not on file  Other Topics Concern  . Not on file  Social History Narrative  . Not on file    Outpatient Medications Prior to Visit  Medication Sig Dispense Refill  . albuterol (VENTOLIN HFA) 108 (90 Base) MCG/ACT inhaler Ventolin HFA 90 mcg/actuation aerosol inhaler    . amLODipine (NORVASC) 10 MG tablet Take 1 tablet (10 mg total) by mouth daily. 90 tablet 3  . aspirin 81 MG tablet Take 81 mg by mouth daily.    [name] atorvastatin (LIPITOR) 10 MG tablet Take 1 tablet (10 mg total) by mouth daily. 90 tablet 3  . buPROPion (WELLBUTRIN XL) 300 MG 24 hr tablet Take 1 tablet (300 mg total) by mouth daily. 90 tablet 1  . carvedilol (COREG) 12.5 MG tablet Take 1 tablet (12.5 mg total) by mouth 2 (two) times daily. 180 tablet 3  . clindamycin (CLEOCIN-T) 1 % lotion Apply topically 2 (two) times daily. 60 mL 0  .  clobetasol (TEMOVATE) 0.05 % external solution clobetasol 0.05 % scalp solution  APP 1 ML EXT AA D    . clonazePAM (KLONOPIN) 0.5 MG tablet Take 1 tablet (  0.5 mg total) by mouth at bedtime. As needed. 90 tablet 0  . diclofenac sodium (VOLTAREN) 1 % GEL Apply 2 g topically 4 (four) times daily. 100 g 0  . furosemide (LASIX) 20 MG tablet Take 1 tablet (20 mg total) by mouth daily. 90 tablet 3  . gabapentin (NEURONTIN) 300 MG capsule Take 1 capsule (300 mg total) by mouth at bedtime. 90 capsule 1  . levonorgestrel (MIRENA) 20 MCG/24HR IUD 1 each by Intrauterine route once.    [name] lisinopril (PRINIVIL,ZESTRIL) 20 MG tablet Take 1 tablet (20 mg total) by mouth daily. 90 tablet 3  . nitroGLYCERIN (NITROSTAT) 0.4 MG SL tablet Place 1 tablet (0.4 mg total) under the tongue every 5 (five) minutes as needed for chest pain. 25 tablet 3  . pantoprazole (PROTONIX) 40 MG tablet Take 1 tablet (40 mg total) by mouth daily. 90 tablet 3  . psyllium (METAMUCIL SMOOTH TEXTURE) 28 % packet Take 1 packet by mouth 2 (two) times daily.    [name] spironolactone (ALDACTONE) 50 MG tablet Take 50 mg by mouth daily.     No facility-administered medications prior to visit.     Allergies  Allergen Reactions  . Percocet [Oxycodone-Acetaminophen] Nausea Only    ROS Review of Systems Review of Systems  Constitutional: Negative for activity change, appetite change, chills and fever.  HENT: Negative for congestion, nosebleeds, trouble swallowing and voice change.   Respiratory: Negative for cough, shortness of breath and wheezing.   Gastrointestinal: Negative for diarrhea, nausea and vomiting.  Genitourinary: Negative for difficulty urinating, dysuria, flank pain and hematuria.  Musculoskeletal: see hpi Neurological: Negative for dizziness, speech difficulty, light-headedness and numbness.  See HPI. All other review of systems negative.     Objective:    Physical Exam  BP (!) 164/106 (BP Location: Left Arm, Patient  Position: Sitting, Cuff Size: Large)   Pulse 95   Temp 98.1 F (36.7 C)   Ht 5\' 4"  (1.626 m)   Wt 259 lb (117.5 kg)   SpO2 99%   BMI 44.46 kg/m  Wt Readings from Last 3 Encounters:  04/06/19 259 lb (117.5 kg)  02/25/19 256 lb (116.1 kg)  09/18/18 256 lb (116.1 kg)   Physical Exam  Constitutional: Oriented to person, place, and time. Appears well-developed and well-nourished.  HENT:  Head: Normocephalic and atraumatic.  Eyes: Conjunctivae and EOM are normal.  Cardiovascular: Normal rate, regular rhythm, normal heart sounds and intact distal pulses.  No murmur heard. Pulmonary/Chest: Effort normal and breath sounds normal. No stridor. No respiratory distress. Has no wheezes.  Neurological: Is alert and oriented to person, place, and time.  Skin: Skin is warm. Capillary refill takes less than 2 seconds.  Psychiatric: Has a normal mood and affect. Behavior is normal. Judgment and thought content normal.   Musculoskeletal  Right elbow tender over the lateral epicondyl Normal olecranon process Tender with pronation and supination Grip strength reduced slightly on right  Health Maintenance Due  Topic Date Due  . HIV Screening  12/28/1989  . PAP SMEAR-Modifier  05/26/2019    There are no preventive care reminders to display for this patient.  Lab Results  Component Value Date   TSH 1.260 03/11/2017   Lab Results  Component Value Date   WBC 8.1 03/11/2017   HGB 12.3 03/11/2017   HCT 38.9 03/11/2017   MCV 90 03/11/2017   PLT 431 (H) 03/11/2017   Lab Results  Component Value Date   NA 136 01/28/2019   K  4.3 01/28/2019   CO2 21 01/28/2019   GLUCOSE 103 (H) 01/28/2019   BUN 6 01/28/2019   CREATININE 0.68 01/28/2019   BILITOT 0.2 03/11/2017   ALKPHOS 97 03/11/2017   AST 16 03/11/2017   ALT 13 03/11/2017   PROT 7.1 03/11/2017   ALBUMIN 3.9 03/11/2017   CALCIUM 9.3 01/28/2019   GFR 101.16 02/16/2014   Lab Results  Component Value Date   CHOL 150 03/11/2017    Lab Results  Component Value Date   HDL 39 (L) 03/11/2017   Lab Results  Component Value Date   LDLCALC 82 03/11/2017   Lab Results  Component Value Date   TRIG 146 03/11/2017   Lab Results  Component Value Date   CHOLHDL 3.8 03/11/2017   Lab Results  Component Value Date   HGBA1C 5.1 03/11/2017      Assessment & Plan:   Problem List Items Addressed This Visit      Cardiovascular and Mediastinum   Essential hypertension  - bp uncontrolled, follow up with Cardiology Stop nsaids for pain and use tylenol    Other Visit Diagnoses    Lateral epicondylitis of right elbow    -  Primary Pt should decrease nsaids for pain Follow up with Orthopedics to discuss corticosteroid injection   Relevant Orders   DG ELBOW COMPLETE RIGHT (3+VIEW) (Completed)      No orders of the defined types were placed in this encounter.   Follow-up: Return for follow up with Orthopedics for elbow and Cardiology for blood pressure.    Forrest Moron, MD

## 2019-04-06 NOTE — Patient Instructions (Addendum)
Call orthopedics about lateral epicondylitis  Take tylenol arthritis for pain Topical rub - aspercreme with lidocaine   Tennis Elbow  Tennis elbow (lateral epicondylitis) is inflammation of tendons in your outer forearm, near your elbow. Tendons are tissues that connect muscle to bone. When you have tennis elbow, inflammation affects the tendons that you use to bend your wrist and move your hand up. Inflammation occurs in the lower part of the upper arm bone (humerus), where the tendons connect to the bone (lateral epicondyle). Tennis elbow often affects people who play tennis, but anyone may get the condition from repeatedly extending the wrist or turning the forearm. What are the causes? This condition is usually caused by repeatedly extending the wrist, turning the forearm, and using the hands. It can result from sports or work that requires repetitive forearm movements. In some cases, it may be caused by a sudden injury. What increases the risk? You are more likely to develop tennis elbow if you play tennis or another racket sport. You also have a higher risk if you frequently use your hands for work. Besides people who play tennis, others at greater risk include:  Musicians.  Carpenters, painters, and plumbers.  Cooks.  Cashiers.  People who work in [employer].  Architect workers.  Butchers.  People who use computers. What are the signs or symptoms? Symptoms of this condition include:  Pain and tenderness in the forearm and the outer part of the elbow. Pain may be felt only when using the arm, or it may be there all the time.  A burning feeling that starts in the elbow and spreads down the forearm.  A weak grip in the hand. How is this diagnosed? This condition may be diagnosed based on:  Your symptoms and medical history.  A physical exam.  X-rays.  MRI. How is this treated? Resting and icing your arm is often the first treatment. Your health care provider may  also recommend:  Medicines to reduce pain and inflammation. These may be in the form of a pill, topical gels, or shots of a steroid medicine (cortisone).  An elbow strap to reduce stress on the area.  Physical therapy. This may include massage or exercises.  An elbow brace to restrict the movements that cause symptoms. If these treatments do not help relieve your symptoms, your health care provider may recommend surgery to remove damaged muscle and reattach healthy muscle to bone. Follow these instructions at home: Activity  Rest your elbow and wrist and avoid activities that cause symptoms, as told by your health care provider.  Do physical therapy exercises as instructed.  If you lift an object, lift it with your palm facing up. This reduces stress on your elbow. Lifestyle  If your tennis elbow is caused by sports, check your equipment and make sure that: ? You are using it correctly. ? It is the best fit for you.  If your tennis elbow is caused by work or computer use, take frequent breaks to stretch your arm. Talk with your manager about ways to manage your condition at work. If you have a brace:  Wear the brace or strap as told by your health care provider. Remove it only as told by your health care provider.  Loosen the brace if your fingers tingle, become numb, or turn cold and blue.  Keep the brace clean.  If the brace is not waterproof, ask if you may remove it for bathing. If you must keep the brace on while  bathing: ? Do not let it get wet. ? Cover it with a watertight covering when you take a bath or a shower. General instructions   If directed, put ice on the painful area: ? Put ice in a plastic bag. ? Place a towel between your skin and the bag. ? Leave the ice on for 20 minutes, 2-3 times a day.  Take over-the-counter and prescription medicines only as told by your health care provider.  Keep all follow-up visits as told by your health care provider. This  is important. Contact a health care provider if:  You have pain that gets worse or does not get better with treatment.  You have numbness or weakness in your forearm, hand, or fingers. Summary  Tennis elbow (lateral epicondylitis) is inflammation of tendons in your outer forearm, near your elbow.  Common symptoms include pain and tenderness in your forearm and the outer part of your elbow.  This condition is usually caused by repeatedly extending your wrist, turning your forearm, and using your hands.  The first treatment is often resting and icing your arm to relieve symptoms. Further treatment may include taking medicine, getting physical therapy, wearing a brace or strap, or having surgery. This information is not intended to replace advice given to you by your health care provider. Make sure you discuss any questions you have with your health care provider. Document Released: 05/28/2005 Document Revised: 02/21/2018 Document Reviewed: 03/12/2017 Elsevier Patient Education  2020 [employer].

## 2019-04-07 ENCOUNTER — Telehealth: Payer: Self-pay | Admitting: Family Medicine

## 2019-04-07 NOTE — Telephone Encounter (Signed)
Pt dropped off medical accomodation request to be filled out by provider / paperwork put in provider box [facility] [place] desk   FR

## 2019-04-08 ENCOUNTER — Telehealth: Payer: Self-pay

## 2019-04-08 DIAGNOSIS — I10 Essential (primary) hypertension: Secondary | ICD-10-CM

## 2019-04-08 MED ORDER — LISINOPRIL 40 MG PO TABS: 40.0000 mg | ORAL_TABLET | Freq: Every day | ORAL | 3 refills | Status: DC

## 2019-04-08 NOTE — Telephone Encounter (Signed)
Called and spoke to patient and instructed her to increase her lisinopril to 40 mg QD. Rx sent to preferred pharmacy. Patient will send MyChart message with readings in a couple of weeks and let us know if she needs anything before then.

## 2019-04-08 NOTE — Telephone Encounter (Signed)
-----   Message from Jettie Booze, MD sent at 04/06/2019  9:49 PM EDT ----- Regarding: FW: Blood pressures Increase lisinopril to 40 mg daily. If HR will tolerate, next step woud be to increase Coreg  JV ----- Message ----- From: Forrest Moron, MD Sent: 04/06/2019   5:37 PM EDT To: Jettie Booze, MD Subject: Blood pressures                                [name] was seen today and her blood pressure was elevated. She is uninsured until January 2021 when her new employment kicks in. Please review the notes and see if any adjustments can be made.   Dr. Nolon Rod

## 2019-04-09 NOTE — Telephone Encounter (Signed)
Medication updated

## 2019-04-09 NOTE — Telephone Encounter (Signed)
-----   Message from Jettie Booze, MD sent at 04/06/2019  9:49 PM EDT ----- Regarding: FW: Blood pressures Increase lisinopril to 40 mg daily. If HR will tolerate, next step woud be to increase Coreg  JV ----- Message ----- From: Forrest Moron, MD Sent: 04/06/2019   5:37 PM EDT To: Jettie Booze, MD Subject: Blood pressures                                [name] was seen today and her blood pressure was elevated. She is uninsured until January 2021 when her new employment kicks in. Please review the notes and see if any adjustments can be made.   Dr. Nolon Rod

## 2019-04-10 ENCOUNTER — Other Ambulatory Visit: Payer: Self-pay

## 2019-04-10 DIAGNOSIS — Z20822 Contact with and (suspected) exposure to covid-19: Secondary | ICD-10-CM

## 2019-04-11 LAB — NOVEL CORONAVIRUS, NAA: SARS-CoV-2, NAA: NOT DETECTED

## 2019-04-17 ENCOUNTER — Telehealth: Payer: Self-pay | Admitting: *Deleted

## 2019-04-17 NOTE — Telephone Encounter (Signed)
Notified pt-- FMLA is ready for pick at the front office. Pt stated will come to the office today before closing.

## 2019-07-03 ENCOUNTER — Ambulatory Visit: Payer: Self-pay | Admitting: Family Medicine

## 2019-07-06 ENCOUNTER — Ambulatory Visit: Payer: Self-pay | Admitting: Family Medicine

## 2019-07-13 ENCOUNTER — Other Ambulatory Visit: Payer: Self-pay

## 2019-07-13 ENCOUNTER — Encounter: Payer: Self-pay | Admitting: Podiatry

## 2019-07-13 ENCOUNTER — Ambulatory Visit: Payer: Managed Care, Other (non HMO) | Admitting: Podiatry

## 2019-07-13 VITALS — Temp 97.1°F

## 2019-07-13 DIAGNOSIS — M21621 Bunionette of right foot: Secondary | ICD-10-CM | POA: Diagnosis not present

## 2019-07-13 DIAGNOSIS — L600 Ingrowing nail: Secondary | ICD-10-CM

## 2019-07-13 DIAGNOSIS — M21622 Bunionette of left foot: Secondary | ICD-10-CM | POA: Diagnosis not present

## 2019-07-13 MED ORDER — NEOMYCIN-POLYMYXIN-HC 3.5-10000-1 OT SOLN: OTIC | 0 refills | Status: DC

## 2019-07-13 NOTE — Patient Instructions (Signed)

## 2019-07-13 NOTE — Progress Notes (Signed)
Subjective:   Patient ID: Tammy Hogan, female   DOB: 44 y.o.   MRN: [id number]   HPI Patient presents stating she has a painful ingrown toenail on her right big toe and also the spot underneath her foot right over left is very sore and she knows she needs to get it fixed as was discussed several years ago.  Patient states is gradually become more of an issue for her   ROS      Objective:  Physical Exam  Neurovascular status intact negative Bevelyn Buckles' sign noted with patient's right and left fifth metatarsals being very tender in the head of the metatarsal.  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  Today I went ahead I infiltrated the right hallux 60 mg like Marcaine mixture sterile prep applied and using sterile instrumentation I removed the medial border exposed matrix applied phenol 3 applications 30 seconds followed by alcohol lavage sterile dressing gave instructions on soaks to leave dressing on 24 hours but take it off earlier but start to drain.  Patient is to be seen back 2 weeks and is encouraged to call with questions concerns which may come up before that

## 2019-07-19 ENCOUNTER — Other Ambulatory Visit: Payer: Self-pay | Admitting: Family Medicine

## 2019-07-20 ENCOUNTER — Ambulatory Visit: Payer: Self-pay | Admitting: Family Medicine

## 2019-07-20 NOTE — Telephone Encounter (Signed)
Patient is requesting a refill of the following medications: Requested Prescriptions   Pending Prescriptions Disp Refills   clonazePAM (KLONOPIN) 0.5 MG tablet [Pharmacy Med Name: clonazePAM 0.5 MG TABLET] 30 tablet 0    Sig: TAKE ONE TABLET BY MOUTH EVERY NIGHT AT BEDTIME AS NEEDED    Date of patient request: 07/19/19 Last office visit: 02/25/19 Date of last refill: 09/16/18 Last refill amount: 90 Follow up time period per chart: 08/05/19

## 2019-07-29 ENCOUNTER — Ambulatory Visit: Payer: Managed Care, Other (non HMO) | Admitting: Podiatry

## 2019-08-05 ENCOUNTER — Ambulatory Visit: Payer: Managed Care, Other (non HMO) | Admitting: Family Medicine

## 2019-08-05 ENCOUNTER — Encounter: Payer: Self-pay | Admitting: Family Medicine

## 2019-08-05 ENCOUNTER — Other Ambulatory Visit: Payer: Self-pay

## 2019-08-05 VITALS — BP 131/83 | HR 97 | Temp 97.3°F | Resp 17 | Ht 64.0 in | Wt 254.2 lb

## 2019-08-05 DIAGNOSIS — F419 Anxiety disorder, unspecified: Secondary | ICD-10-CM

## 2019-08-05 DIAGNOSIS — F329 Major depressive disorder, single episode, unspecified: Secondary | ICD-10-CM | POA: Diagnosis not present

## 2019-08-05 DIAGNOSIS — F32A Depression, unspecified: Secondary | ICD-10-CM

## 2019-08-05 DIAGNOSIS — Z5181 Encounter for therapeutic drug level monitoring: Secondary | ICD-10-CM | POA: Diagnosis not present

## 2019-08-05 DIAGNOSIS — L669 Cicatricial alopecia, unspecified: Secondary | ICD-10-CM

## 2019-08-05 NOTE — Progress Notes (Signed)
Established Patient Office Visit  Subjective:  Patient ID: Tammy Hogan, female    DOB: 04-24-1976  Age: 44 y.o. MRN: [id number]  CC:  Chief Complaint  Patient presents with  . Depression    4 month f/u  . Foot Burn    tip of big toes burn x couple months.  derm wants to see if pt iron was low    HPI Aviv Salz presents for   Patient reports that her mood is better with working and that her job helps her mood.  She is getting married on 09/12/2019. She states that her smoking is less since her mood is less.  She is smoking about a pack a week.  Depression screen [facility] 2/9 08/05/2019 04/06/2019 02/25/2019 09/16/2018 06/26/2018  Decreased Interest 0 0 1 1 0  Down, Depressed, Hopeless 0 0 1 1 0  PHQ - 2 Score 0 0 2 2 0  Altered sleeping - - 3 2 -  Tired, decreased energy - - 1 1 -  Change in appetite - - 1 2 -  Feeling bad or failure about yourself  - - 1 2 -  Trouble concentrating - - 1 2 -  Moving slowly or fidgety/restless - - - 1 -  Suicidal thoughts - - 1 0 -  PHQ-9 Score - - 10 12 -  Difficult doing work/chores - - Very difficult Very difficult -    She is doing rogaine, propecia, spironolactone  She has been craving salt on the new meds She states that she feels anxious sometimes  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 Anal fissure   . Anginal pain ([place])   . [place], [place]  . Depression   . GERD (gastroesophageal reflux disease)   . History of cardiac cath 07/23/2012   Dr Irish Lack  . History of echocardiogram    Cardiomyopathy-reduced EF 35-40% with wall m otion abnormality concerning for ischemia. Hypokinesis of the anterior and anteroseptal walls to the apex  . HPV (human papilloma virus) infection   . Hyperlipidemia   . Hypertension   . PCOS (polycystic ovarian syndrome)    pt denies  . PPD positive,  treated 2008   [place], [place]    Past Surgical History:  Procedure Laterality Date  . CARDIAC CATHETERIZATION  feb, 2014   Dr Irish Lack  . ORIF ANKLE FRACTURE Left 07/10/2013   Procedure: OPEN REDUCTION INTERNAL FIXATION (ORIF) LEFT BIMALLEOLAR ANKLE FRACTURE;  Surgeon: Mcarthur Rossetti, MD;  Location: [facility];  Service: Orthopedics;  Laterality: Left;    Family History  Problem Relation Age of Onset  . Hypertension Mother   . Hyperlipidemia Mother   . Heart failure Paternal Grandmother   . Hypertension Paternal Grandmother   . Diabetes Paternal Grandmother   . Glaucoma Paternal Grandmother   . Other Father        drug overdose  . Drug abuse Father        overdose  . Hypertension Sister   . Cancer Maternal Grandmother   . Cirrhosis Paternal Grandfather   . Alcohol abuse Paternal Grandfather   . Cancer Other 78       breast, cervical and colon cancer    Social History   Socioeconomic History  . Marital status: Single    Spouse name: Not  on file  . Number of children: 0  . Years of education: Not on file  . Highest education level: Not on file  Occupational History  . Not on file  Tobacco Use  . Smoking status: Current Every Day Smoker    Packs/day: 0.50    Years: 16.00    Pack years: 8.00  . Smokeless tobacco: Never Used  Substance and Sexual Activity  . Alcohol use: Yes    Alcohol/week: 0.0 standard drinks    Comment: rarely  . Drug use: No  . Sexual activity: Yes    Birth control/protection: I.U.D.  Other Topics Concern  . Not on file  Social History Narrative  . Not on file   Social Determinants of Health   Financial Resource Strain:   . Difficulty of Paying Living Expenses: Not on file  Food Insecurity:   . Worried About Charity fundraiser in the Last Year: Not on file  . Ran Out of Food in the Last Year: Not on file  Transportation Needs:   . Lack of Transportation (Medical): Not on file  . Lack of Transportation (Non-Medical): Not on file    Physical Activity:   . Days of Exercise per Week: Not on file  . Minutes of Exercise per Session: Not on file  Stress:   . Feeling of Stress : Not on file  Social Connections:   . Frequency of Communication with Friends and Family: Not on file  . Frequency of Social Gatherings with Friends and Family: Not on file  . Attends Religious Services: Not on file  . Active Member of Clubs or Organizations: Not on file  . Attends Archivist Meetings: Not on file  . Marital Status: Not on file  Intimate Partner Violence:   . Fear of Current or Ex-Partner: Not on file  . Emotionally Abused: Not on file  . Physically Abused: Not on file  . Sexually Abused: Not on file    Outpatient Medications Prior to Visit  Medication Sig Dispense Refill  . amLODipine (NORVASC) 10 MG tablet Take 1 tablet (10 mg total) by mouth daily. 90 tablet 3  . aspirin 81 MG tablet Take 81 mg by mouth daily.    [name] atorvastatin (LIPITOR) 10 MG tablet Take 1 tablet (10 mg total) by mouth daily. 90 tablet 3  . buPROPion (WELLBUTRIN XL) 300 MG 24 hr tablet Take 1 tablet (300 mg total) by mouth daily. 90 tablet 1  . carvedilol (COREG) 12.5 MG tablet Take 1 tablet (12.5 mg total) by mouth 2 (two) times daily. 180 tablet 3  . clobetasol (TEMOVATE) 0.05 % external solution clobetasol 0.05 % scalp solution  APP 1 ML EXT AA D    . clonazePAM (KLONOPIN) 0.5 MG tablet TAKE ONE TABLET BY MOUTH EVERY NIGHT AT BEDTIME AS NEEDED 30 tablet 0  . diclofenac sodium (VOLTAREN) 1 % GEL Apply 2 g topically 4 (four) times daily. 100 g 0  . finasteride (PROPECIA) 1 MG tablet Take 1 mg by mouth daily.    . furosemide (LASIX) 20 MG tablet Take 1 tablet (20 mg total) by mouth daily. 90 tablet 3  . gabapentin (NEURONTIN) 300 MG capsule Take 1 capsule (300 mg total) by mouth at bedtime. 90 capsule 1  . levonorgestrel (MIRENA) 20 MCG/24HR IUD 1 each by Intrauterine route once.    [name] lisinopril (ZESTRIL) 40 MG tablet Take 1 tablet (40 mg  total) by mouth daily. 90 tablet 3  . neomycin-polymyxin-hydrocortisone (CORTISPORIN)  OTIC solution Apply 1-2 drops to toe after soaking twice a day 10 mL 0  . nitroGLYCERIN (NITROSTAT) 0.4 MG SL tablet Place 1 tablet (0.4 mg total) under the tongue every 5 (five) minutes as needed for chest pain. 25 tablet 3  . pantoprazole (PROTONIX) 40 MG tablet Take 1 tablet (40 mg total) by mouth daily. 90 tablet 3  . psyllium (METAMUCIL SMOOTH TEXTURE) 28 % packet Take 1 packet by mouth 2 (two) times daily.    [name] spironolactone (ALDACTONE) 50 MG tablet Take 50 mg by mouth daily.    . finasteride (PROSCAR) 5 MG tablet Take 1 mg by mouth daily.     No facility-administered medications prior to visit.    Allergies  Allergen Reactions  . Percocet [Oxycodone-Acetaminophen] Nausea Only    ROS Review of Systems Review of Systems  Constitutional: Negative for activity change, appetite change, chills and fever.  HENT: Negative for congestion, nosebleeds, trouble swallowing and voice change.   Respiratory: Negative for cough, shortness of breath and wheezing.   Gastrointestinal: Negative for diarrhea, nausea and vomiting.  Genitourinary: Negative for difficulty urinating, dysuria, flank pain and hematuria.  Musculoskeletal: Negative for back pain, joint swelling and neck pain.  Neurological: Negative for dizziness, speech difficulty, light-headedness and numbness.  See HPI. All other review of systems negative.     Objective:    Physical Exam  BP 131/83 (BP Location: Right Arm, Patient Position: Sitting, Cuff Size: Large)   Pulse 97   Temp (!) 97.3 F (36.3 C) (Temporal)   Resp 17   Ht 5\' 4"  (1.626 m)   Wt 254 lb 3.2 oz (115.3 kg)   SpO2 97%   BMI 43.63 kg/m  Wt Readings from Last 3 Encounters:  08/05/19 254 lb 3.2 oz (115.3 kg)  04/06/19 259 lb (117.5 kg)  02/25/19 256 lb (116.1 kg)    Physical Exam  Constitutional: Oriented to person, place, and time. Appears well-developed and  well-nourished.  HENT:  Head: Normocephalic and atraumatic.  Eyes: Conjunctivae and EOM are normal.  Cardiovascular: Normal rate, regular rhythm, normal heart sounds and intact distal pulses.  No murmur heard. Pulmonary/Chest: Effort normal and breath sounds normal. No stridor. No respiratory distress. Has no wheezes.  Neurological: Is alert and oriented to person, place, and time.  Skin: Skin is warm. Capillary refill takes less than 2 seconds. Hair loss noted on the crown of the head and the frontal area of scalp. Psychiatric: Has a normal mood and affect. Behavior is normal. Judgment and thought content normal.   Health Maintenance Due  Topic Date Due  . HIV Screening  12/28/1989  . PAP SMEAR-Modifier  05/26/2019    There are no preventive care reminders to display for this patient.  Lab Results  Component Value Date   TSH 1.260 03/11/2017   Lab Results  Component Value Date   WBC 8.1 03/11/2017   HGB 12.3 03/11/2017   HCT 38.9 03/11/2017   MCV 90 03/11/2017   PLT 431 (H) 03/11/2017   Lab Results  Component Value Date   NA 136 01/28/2019   K 4.3 01/28/2019   CO2 21 01/28/2019   GLUCOSE 103 (H) 01/28/2019   BUN 6 01/28/2019   CREATININE 0.68 01/28/2019   BILITOT 0.2 03/11/2017   ALKPHOS 97 03/11/2017   AST 16 03/11/2017   ALT 13 03/11/2017   PROT 7.1 03/11/2017   ALBUMIN 3.9 03/11/2017   CALCIUM 9.3 01/28/2019   GFR 101.16 02/16/2014   Lab Results  Component Value Date   CHOL 150 03/11/2017   Lab Results  Component Value Date   HDL 39 (L) 03/11/2017   Lab Results  Component Value Date   LDLCALC 82 03/11/2017   Lab Results  Component Value Date   TRIG 146 03/11/2017   Lab Results  Component Value Date   CHOLHDL 3.8 03/11/2017   Lab Results  Component Value Date   HGBA1C 5.1 03/11/2017      Assessment & Plan:   Problem List Items Addressed This Visit    None    Visit Diagnoses    Central centrifugal scarring alopecia    -  Primary Will  screen for deficiencies and send to Dermatology Pt already on treatment   Relevant Orders   Iron, TIBC and Ferritin Panel   CBC   TSH   VITAMIN D 25 Hydroxy (Vit-D Deficiency, Fractures)   Basic metabolic panel   Encounter for medication monitoring     Pt on spironolactone and furosemide Will check lytes especially with her salt cravings   Relevant Orders   Basic metabolic panel   Anxiety and depression   - Patient doing well with her mood , cpm       No orders of the defined types were placed in this encounter.   Follow-up: No follow-ups on file.    Forrest Moron, MD

## 2019-08-05 NOTE — Patient Instructions (Signed)
° ° ° °  If you have lab work done today you will be contacted with your lab results within the next 2 weeks.  If you have not heard from us then please contact us. The fastest way to get your results is to register for My Chart. ° ° °IF you received an x-ray today, you will receive an invoice from [facility]. Please contact [facility] at [phone] with questions or concerns regarding your invoice.  ° °IF you received labwork today, you will receive an invoice from [facility]. Please contact [facility] at [phone] with questions or concerns regarding your invoice.  ° °Our billing staff will not be able to assist you with questions regarding bills from these companies. ° °You will be contacted with the lab results as soon as they are available. The fastest way to get your results is to activate your My Chart account. Instructions are located on the last page of this paperwork. If you have not heard from us regarding the results in 2 weeks, please contact this office. °  ° ° ° °

## 2019-08-06 LAB — CBC
Hematocrit: 39.5 % (ref 34.0–46.6)
Hemoglobin: 13.5 g/dL (ref 11.1–15.9)
MCH: 30.6 pg (ref 26.6–33.0)
MCHC: 34.2 g/dL (ref 31.5–35.7)
MCV: 90 fL (ref 79–97)
Platelets: 444 10*3/uL (ref 150–450)
RBC: 4.41 x10E6/uL (ref 3.77–5.28)
RDW: 12.4 % (ref 11.7–15.4)
WBC: 11.2 10*3/uL — ABNORMAL HIGH (ref 3.4–10.8)

## 2019-08-06 LAB — BASIC METABOLIC PANEL
BUN/Creatinine Ratio: 14 (ref 9–23)
BUN: 11 mg/dL (ref 6–24)
CO2: 22 mmol/L (ref 20–29)
Calcium: 9.4 mg/dL (ref 8.7–10.2)
Chloride: 98 mmol/L (ref 96–106)
Creatinine, Ser: 0.81 mg/dL (ref 0.57–1.00)
GFR calc Af Amer: 102 mL/min/{1.73_m2} (ref >59)
GFR calc non Af Amer: 89 mL/min/{1.73_m2} (ref >59)
Glucose: 118 mg/dL — ABNORMAL HIGH (ref 65–99)
Potassium: 4.4 mmol/L (ref 3.5–5.2)
Sodium: 136 mmol/L (ref 134–144)

## 2019-08-06 LAB — IRON,TIBC AND FERRITIN PANEL
Ferritin: 133 ng/mL (ref 15–150)
Iron Saturation: 21 % (ref 15–55)
Iron: 69 ug/dL (ref 27–159)
Total Iron Binding Capacity: 326 ug/dL (ref 250–450)
UIBC: 257 ug/dL (ref 131–425)

## 2019-08-06 LAB — TSH: TSH: 1.94 u[IU]/mL (ref 0.450–4.500)

## 2019-08-06 LAB — VITAMIN D 25 HYDROXY (VIT D DEFICIENCY, FRACTURES): Vit D, 25-Hydroxy: 15.9 ng/mL — ABNORMAL LOW (ref 30.0–100.0)

## 2019-08-07 ENCOUNTER — Telehealth: Payer: Self-pay

## 2019-08-07 ENCOUNTER — Ambulatory Visit: Payer: Managed Care, Other (non HMO) | Admitting: Podiatry

## 2019-08-07 NOTE — Telephone Encounter (Signed)
Recent Lab Results have been faxed to [place] office at [phone].

## 2019-08-12 ENCOUNTER — Other Ambulatory Visit: Payer: Self-pay

## 2019-08-12 ENCOUNTER — Ambulatory Visit: Payer: Managed Care, Other (non HMO) | Admitting: Podiatry

## 2019-08-19 ENCOUNTER — Other Ambulatory Visit: Payer: Self-pay

## 2019-08-19 ENCOUNTER — Ambulatory Visit (INDEPENDENT_AMBULATORY_CARE_PROVIDER_SITE_OTHER): Payer: Managed Care, Other (non HMO)

## 2019-08-19 ENCOUNTER — Encounter: Payer: Self-pay | Admitting: Podiatry

## 2019-08-19 ENCOUNTER — Ambulatory Visit: Payer: Managed Care, Other (non HMO) | Admitting: Podiatry

## 2019-08-19 VITALS — Temp 97.6°F

## 2019-08-19 DIAGNOSIS — M779 Enthesopathy, unspecified: Secondary | ICD-10-CM

## 2019-08-19 DIAGNOSIS — L600 Ingrowing nail: Secondary | ICD-10-CM

## 2019-08-19 DIAGNOSIS — M21622 Bunionette of left foot: Secondary | ICD-10-CM | POA: Diagnosis not present

## 2019-08-19 DIAGNOSIS — M21621 Bunionette of right foot: Secondary | ICD-10-CM | POA: Diagnosis not present

## 2019-08-19 NOTE — Patient Instructions (Addendum)
Pre-Operative Instructions  Congratulations, you have decided to take an important step towards improving your quality of life.  You can be assured that the doctors and staff at [facility] will be with you every step of the way.  Here are some important things you should know:  1. Plan to be at the surgery center/hospital at least 1 (one) hour prior to your scheduled time, unless otherwise directed by the surgical center/hospital staff.  You must have a responsible adult accompany you, remain during the surgery and drive you home.  Make sure you have directions to the surgical center/hospital to ensure you arrive on time. 2. If you are having surgery at [facility] or [facility] hospitals, you will need a copy of your medical history and physical form from your family physician within one month prior to the date of surgery. We will give you a form for your primary physician to complete.  3. We make every effort to accommodate the date you request for surgery.  However, there are times where surgery dates or times have to be moved.  We will contact you as soon as possible if a change in schedule is required.   4. No aspirin/ibuprofen for one week before surgery.  If you are on aspirin, any non-steroidal anti-inflammatory medications (Mobic, Aleve, Ibuprofen) should not be taken seven (7) days prior to your surgery.  You make take Tylenol for pain prior to surgery.  5. Medications - If you are taking daily heart and blood pressure medications, seizure, reflux, allergy, asthma, anxiety, pain or diabetes medications, make sure you notify the surgery center/hospital before the day of surgery so they can tell you which medications you should take or avoid the day of surgery. 6. No food or drink after midnight the night before surgery unless directed otherwise by surgical center/hospital staff. 7. No alcoholic beverages 24-hours prior to surgery.  No smoking 24-hours prior or 24-hours after  surgery. 8. Wear loose pants or shorts. They should be loose enough to fit over bandages, boots, and casts. 9. Don't wear slip-on shoes. Sneakers are preferred. 10. Bring your boot with you to the surgery center/hospital.  Also bring crutches or a walker if your physician has prescribed it for you.  If you do not have this equipment, it will be provided for you after surgery. 11. If you have not been contacted by the surgery center/hospital by the day before your surgery, call to confirm the date and time of your surgery. 12. Leave-time from work may vary depending on the type of surgery you have.  Appropriate arrangements should be made prior to surgery with your employer. 13. Prescriptions will be provided immediately following surgery by your doctor.  Fill these as soon as possible after surgery and take the medication as directed. Pain medications will not be refilled on weekends and must be approved by the doctor. 14. Remove nail polish on the operative foot and avoid getting pedicures prior to surgery. 15. Wash the night before surgery.  The night before surgery wash the foot and leg well with water and the antibacterial soap provided. Be sure to pay special attention to beneath the toenails and in between the toes.  Wash for at least three (3) minutes. Rinse thoroughly with water and dry well with a towel.  Perform this wash unless told not to do so by your physician.  Enclosed: 1 Ice pack (please put in freezer the night before surgery)   1 Hibiclens skin cleaner     Pre-op instructions  If you have any questions regarding the instructions, please do not hesitate to call our office.  [place]: [address], [place], [place] [address] -- [phone]  [place]: [address] [place], [place] [address] -- [phone]  [place]: [address], [place], [place] [address] -- [phone]   Website: [email or web address] 

## 2019-08-20 NOTE — Progress Notes (Signed)
Subjective:   Patient ID: Tammy Hogan, female   DOB: 44 y.o.   MRN: [id number]   HPI Patient presents stating my nail seems to be healing well but I have this terrible lesion bottom of the foot right over left and it is getting worse and I am getting married in April and I like it fixed after that.  States is very hard for her to walk comfortably   ROS      Objective:  Physical Exam  Neurovascular status intact with patient found to have well-healing surgical site right hallux with no drainage and on the bottom of the fifth metatarsal right severe lesion formation with prominence of the fifth metatarsal head with same true for left but not symptomatic     Assessment:  Plantarflexed metatarsal with chronic keratotic lesion and pain with palpation with lesion formation right over left with well-healed ingrown toenail site H     Plan:  P and condition reviewed and at this point I have reviewed x-rays.  I then went ahead recommended surgery with fifth metatarsal head resection along with wide plantar skin resection.  Patient wants surgery and I allowed her to go over consent form explaining what we would do at all possible complications that can occur and alternative treatments.  Patient is willing to accept risk of surgery wants to have this done and after extensive review signed consent form and is scheduled for outpatient surgery and I am going to do plantar skin wedge resection so I did dispense air fracture walker to reduce all plantar pressure.  Patient will be seen back for surgery in May is encouraged to call with questions understands total recovery can take up to 6 months  X-rays indicate there is some prominence around the fifth metatarsal head bilateral with no indications arthritis stress fracture osteoporosis

## 2019-09-01 ENCOUNTER — Other Ambulatory Visit: Payer: Self-pay | Admitting: Family Medicine

## 2019-09-24 ENCOUNTER — Telehealth: Payer: Self-pay

## 2019-09-24 NOTE — Telephone Encounter (Signed)
DOS 10/20/19  METATARSAL OSTEOTOMY RT - 88280 EXC BENIGN LESION RT - 11426  [employer] EFFECTIVE DATE - 06/15/19  PLAN DEDUCTIBLE - $1000 W/$62.50 MET OUT OF POCKET - $4500 K/$349.17 MET COPAY  COINSURANCE - 80%  PER NICOLE NO PRECERT IS REQUIRED FOR CPT 28113 & 11426. CALL REF # [id number]

## 2019-10-07 ENCOUNTER — Other Ambulatory Visit: Payer: Self-pay | Admitting: Family Medicine

## 2019-10-07 ENCOUNTER — Ambulatory Visit: Payer: Managed Care, Other (non HMO) | Admitting: Interventional Cardiology

## 2019-10-07 ENCOUNTER — Other Ambulatory Visit: Payer: Self-pay | Admitting: Interventional Cardiology

## 2019-10-07 ENCOUNTER — Encounter: Payer: Self-pay | Admitting: Interventional Cardiology

## 2019-10-07 ENCOUNTER — Other Ambulatory Visit: Payer: Self-pay

## 2019-10-07 VITALS — BP 138/88 | HR 85 | Ht 64.0 in | Wt 258.2 lb

## 2019-10-07 DIAGNOSIS — I428 Other cardiomyopathies: Secondary | ICD-10-CM

## 2019-10-07 DIAGNOSIS — Z6841 Body Mass Index (BMI) 40.0 and over, adult: Secondary | ICD-10-CM | POA: Diagnosis not present

## 2019-10-07 DIAGNOSIS — I119 Hypertensive heart disease without heart failure: Secondary | ICD-10-CM

## 2019-10-07 DIAGNOSIS — I10 Essential (primary) hypertension: Secondary | ICD-10-CM

## 2019-10-07 MED ORDER — CARVEDILOL 25 MG PO TABS: 25.0000 mg | ORAL_TABLET | Freq: Two times a day (BID) | ORAL | 3 refills | Status: DC

## 2019-10-07 NOTE — Progress Notes (Signed)
Cardiology Office Note   Date:  10/07/2019   ID:  Tammy Hogan, DOB [birth year], MRN [id number]  PCP:  Forrest Moron, MD    No chief complaint on file.  HTN  Wt Readings from Last 3 Encounters:  10/07/19 258 lb 3.2 oz (117.1 kg)  08/05/19 254 lb 3.2 oz (115.3 kg)  04/06/19 259 lb (117.5 kg)       History of Present Illness: Tammy Hogan is a 44 y.o. female  has obesity, PCOS and a nonischemic cardiomyopathy-diagnosed in 2014. She has had difficulty controlled her BP. No CAD by cath in 2/14.  She has had trouble stopping smoking.    Normal LV function noted in 2/16. She was concerned about her hair loss and that the BP meds are contributing.Tammy Hogan  Recently, She was working for the city of [employer].She enjoyed her job, but was terminated in 09/2018.  She was fighting legally for her job.  Since the last visit, she is now working for city of [employer].  She got married in 2021. Mild activity, not as much as she would like.  She had COVID in Jan and had only mild sx.  Her fiancee had it at the time as well.     Denies : Chest pain. Dizziness. Leg edema. Nitroglycerin use. Orthopnea. Palpitations. Paroxysmal nocturnal dyspnea. Shortness of breath. Syncope.   She has had some high HR and higher BP.  Se changed the timing of her BP meds.  Highest has been 160/92.  HR 98.      Past Medical History:  Diagnosis Date  . Anal fissure   . Anginal pain ([place])   . [place], [place]  . Depression   . GERD (gastroesophageal reflux disease)   . History of cardiac cath 07/23/2012   Dr Irish Lack  . History of echocardiogram    Cardiomyopathy-reduced EF 35-40% with wall m otion abnormality concerning for ischemia. Hypokinesis of the anterior and anteroseptal walls to the apex  . HPV (human papilloma virus) infection   . Hyperlipidemia   . Hypertension   . PCOS (polycystic ovarian syndrome)    pt denies  . PPD positive, treated 2008   [place], [place]    Past  Surgical History:  Procedure Laterality Date  . CARDIAC CATHETERIZATION  feb, 2014   Dr Irish Lack  . ORIF ANKLE FRACTURE Left 07/10/2013   Procedure: OPEN REDUCTION INTERNAL FIXATION (ORIF) LEFT BIMALLEOLAR ANKLE FRACTURE;  Surgeon: Mcarthur Rossetti, MD;  Location: [facility];  Service: Orthopedics;  Laterality: Left;     Current Outpatient Medications  Medication Sig Dispense Refill  . amLODipine (NORVASC) 10 MG tablet Take 1 tablet (10 mg total) by mouth daily. 90 tablet 3  . aspirin 81 MG tablet Take 81 mg by mouth daily.    Tammy Hogan atorvastatin (LIPITOR) 10 MG tablet Take 1 tablet (10 mg total) by mouth daily. 90 tablet 3  . buPROPion (WELLBUTRIN XL) 300 MG 24 hr tablet TAKE ONE TABLET BY MOUTH DAILY 90 tablet 0  . carvedilol (COREG) 12.5 MG tablet TAKE ONE TABLET BY MOUTH TWICE A DAY 60 tablet 0  . clobetasol (TEMOVATE) 0.05 % external solution clobetasol 0.05 % scalp solution  APP 1 ML EXT AA D    . clonazePAM (KLONOPIN) 0.5 MG tablet TAKE ONE TABLET BY MOUTH EVERY NIGHT AT BEDTIME AS NEEDED 30 tablet 0  . diclofenac sodium (VOLTAREN) 1 % GEL Apply 2 g topically 4 (four) times daily. 100  g 0  . finasteride (PROPECIA) 1 MG tablet Take 1 mg by mouth daily.    . finasteride (PROSCAR) 5 MG tablet Take 1 mg by mouth daily.    . furosemide (LASIX) 20 MG tablet Take 1 tablet (20 mg total) by mouth daily. PLEASE SCHEDULE FOLLOW UP APPT FOR FURTHER REFILLS [phone] 1ST ATTEMPT 30 tablet 0  . gabapentin (NEURONTIN) 300 MG capsule TAKE ONE CAPSULE BY MOUTH EVERY NIGHT AT BEDTIME 90 capsule 0  . levonorgestrel (MIRENA) 20 MCG/24HR IUD 1 each by Intrauterine route once.    Tammy Hogan lisinopril (ZESTRIL) 40 MG tablet Take 1 tablet (40 mg total) by mouth daily. 90 tablet 3  . neomycin-polymyxin-hydrocortisone (CORTISPORIN) OTIC solution Apply 1-2 drops to toe after soaking twice a day 10 mL 0  . nitroGLYCERIN (NITROSTAT) 0.4 MG SL tablet Place 1 tablet (0.4 mg total) under the tongue every 5 (five) minutes  as needed for chest pain. 25 tablet 3  . pantoprazole (PROTONIX) 40 MG tablet Take 1 tablet (40 mg total) by mouth daily. 90 tablet 3  . psyllium (METAMUCIL SMOOTH TEXTURE) 28 % packet Take 1 packet by mouth 2 (two) times daily.    Tammy Hogan spironolactone (ALDACTONE) 50 MG tablet Take 50 mg by mouth daily.     No current facility-administered medications for this visit.    Allergies:   Percocet [oxycodone-acetaminophen]    Social History:  The patient  reports that she has been smoking. She has a 8.00 pack-year smoking history. She has never used smokeless tobacco. She reports current alcohol use. She reports that she does not use drugs.   Family History:  The patient's family history includes Alcohol abuse in her paternal grandfather; Cancer in her maternal grandmother; Cancer (age of onset: 68) in an other family member; Cirrhosis in her paternal grandfather; Diabetes in her paternal grandmother; Drug abuse in her father; Glaucoma in her paternal grandmother; Heart failure in her paternal grandmother; Hyperlipidemia in her mother; Hypertension in her mother, paternal grandmother, and sister; Other in her father.    ROS:  Please see the history of present illness.   Otherwise, review of systems are positive for high BP readings.   All other systems are reviewed and negative.    PHYSICAL EXAM: VS:  BP 138/88   Pulse 85   Ht 5\' 4"  (1.626 m)   Wt 258 lb 3.2 oz (117.1 kg)   SpO2 95%   BMI 44.32 kg/m  , BMI Body mass index is 44.32 kg/m. GEN: Well nourished, well developed, in no acute distress  HEENT: normal  Neck: no JVD, carotid bruits, or masses Cardiac: RRR; no murmurs, rubs, or gallops,no edema  Respiratory:  clear to auscultation bilaterally, normal work of breathing GI: soft, nontender, nondistended, + BS MS: no deformity or atrophy  Skin: warm and dry, no rash Neuro:  Strength and sensation are intact Psych: euthymic mood, full affect   EKG:   The ekg ordered today demonstrates  NSR, no ST changes   Recent Labs: 08/05/2019: BUN 11; Creatinine, Ser 0.81; Hemoglobin 13.5; Platelets 444; Potassium 4.4; Sodium 136; TSH 1.940   Lipid Panel    Component Value Date/Time   CHOL 150 03/11/2017 0950   TRIG 146 03/11/2017 0950   HDL 39 (L) 03/11/2017 0950   CHOLHDL 3.8 03/11/2017 0950   CHOLHDL 5.6 07/03/2012 0510   VLDL 35 07/03/2012 0510   LDLCALC 82 03/11/2017 0950     Other studies Reviewed: Additional studies/ records that were reviewed today  with results demonstrating: .   ASSESSMENT AND PLAN:  1. NICM: Appears euvolemic. EF normal in 2016. 2. Hypertensive heart disease: Rx for BP cuff was given in 2020.  She checks at home and it is variable.  Increase Coreg 25 mg BID.  Check BP at home.  May need HTN clinic visit if BP stays high.  Check home cuff as well.  3. Morbid obesity: Working on increased exercise. 4. Needs surgery on her right foot, May 11. 5. Although she was just married, she is not interested in having children and has an IUD in place.  I think pregnancy for her would be high risk given her age and prior cardiomyopathy.    Current medicines are reviewed at length with the patient today.  The patient concerns regarding her medicines were addressed.  The following changes have been made:  No change  Labs/ tests ordered today include:  No orders of the defined types were placed in this encounter.   Recommend 150 minutes/week of aerobic exercise Low fat, low carb, high fiber diet recommended  Disposition:   FU in 6 months   Signed, Larae Grooms, MD  10/07/2019 4:43 PM    [facility] [address], [place], [place]  [address] Phone: [phone]; Fax: [phone]

## 2019-10-07 NOTE — Patient Instructions (Signed)
Medication Instructions:  Your physician has recommended you make the following change in your medication:   INCREASE: carvedilol (coreg) to 25 mg twice a day  *If you need a refill on your cardiac medications before your next appointment, please call your pharmacy*   Lab Work: None ordered  If you have labs (blood work) drawn today and your tests are completely normal, you will receive your results only by: [name] MyChart Message (if you have MyChart) OR . A paper copy in the mail If you have any lab test that is abnormal or we need to change your treatment, we will call you to review the results.   Testing/Procedures: None ordered   Follow-Up: At [facility], you and your health needs are our priority.  As part of our continuing mission to provide you with exceptional heart care, we have created designated Provider Care Teams.  These Care Teams include your primary Cardiologist (physician) and Advanced Practice Providers (APPs -  Physician Assistants and Nurse Practitioners) who all work together to provide you with the care you need, when you need it.  We recommend signing up for the patient portal called "MyChart".  Sign up information is provided on this After Visit Summary.  MyChart is used to connect with patients for Virtual Visits (Telemedicine).  Patients are able to view lab/test results, encounter notes, upcoming appointments, etc.  Non-urgent messages can be sent to your provider as well.   To learn more about what you can do with MyChart, go to [email or web address].    Your next appointment:   6 month(s)  The format for your next appointment:   In Person  Provider:   You may see Larae Grooms, MD or one of the following Advanced Practice Providers on your designated Care Team:    Melina Copa, PA-C  Ermalinda Barrios, PA-C    Other Instructions

## 2019-10-07 NOTE — Telephone Encounter (Signed)
Requested Prescriptions  Pending Prescriptions Disp Refills  . 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, [place], MD   1 year ago Acute pain of right shoulder   Primary Care at [facility], Arlie Solomons, MD      Future Appointments            Today Jettie Booze, MD [place], [facility]   In 1 week Carlis Abbott, Gillermo Murdoch, PA-C [facility]   In 1 week Rutherford Guys, MD Primary Care at [place], [facility]

## 2019-10-10 HISTORY — PX: FOOT SURGERY: SHX648

## 2019-10-14 ENCOUNTER — Other Ambulatory Visit: Payer: Self-pay | Admitting: Interventional Cardiology

## 2019-10-14 ENCOUNTER — Encounter: Payer: Self-pay | Admitting: Physician Assistant

## 2019-10-14 ENCOUNTER — Ambulatory Visit: Payer: Managed Care, Other (non HMO) | Admitting: Physician Assistant

## 2019-10-14 ENCOUNTER — Ambulatory Visit (INDEPENDENT_AMBULATORY_CARE_PROVIDER_SITE_OTHER): Payer: Managed Care, Other (non HMO)

## 2019-10-14 ENCOUNTER — Other Ambulatory Visit: Payer: Self-pay

## 2019-10-14 DIAGNOSIS — M25521 Pain in right elbow: Secondary | ICD-10-CM

## 2019-10-14 MED ORDER — METHYLPREDNISOLONE ACETATE 40 MG/ML IJ SUSP
40.0000 mg | INTRAMUSCULAR | Status: AC | PRN
  Administered 2019-10-14: 40 mg

## 2019-10-14 MED ORDER — LIDOCAINE HCL 1 % IJ SOLN
1.0000 mL | INTRAMUSCULAR | Status: AC | PRN
  Administered 2019-10-14: 1 mL

## 2019-10-14 NOTE — Progress Notes (Signed)
Office Visit Note   Patient: Tammy Hogan           Date of Birth: December 06, 1975           MRN: [id number] Visit Date: 10/14/2019              Requested by: Forrest Moron, MD [place],  [place] [address] PCP: Forrest Moron, MD   Assessment & Plan: Visit Diagnoses:  1. Pain in right elbow     Plan: Discussed with her lifting techniques.  Discussed Sarah formal therapy, deep tissue massage versus injection.  She politely injection today.  Did suggest she try lifting techniques of the shoulder and the stretching techniques that were shown to her today.  She will follow-up with [place] if pain persist or becomes worse.  She tolerated the injection well today.  If she decides she wants to go to formal therapy she can call our office and we can definitely send her to therapy for modalities and stretching techniques.  Follow-Up Instructions: Return if symptoms worsen or fail to improve.   Orders:  Orders Placed This Encounter  Procedures  . Hand/UE Inj  . XR Elbow Complete Right (3+View)   No orders of the defined types were placed in this encounter.     Procedures: Hand/UE Inj: R elbow for lateral epicondylitis on 10/14/2019 4:08 PM Indications: pain Details: lateral approach Medications: 1 mL lidocaine 1 %; 40 mg methylPREDNISolone acetate 40 MG/ML      Clinical Data: No additional findings.   Subjective: Chief Complaint  Patient presents with  . Right Elbow - Pain    HPI  Tammy Hogan comes in today for right elbow pain is been ongoing for at least last 6 months.  She was lifting up a couch arm to put her on a slick cover felt a pull lateral aspect of her elbow.  She seen her primary care physician who felt is due to tennis elbow and is given her an elbow strap to wear.  She is also taking some over-the-counter NSAIDs.  She has pain that lateral aspect of the right elbow.  Pain is worse with carrying or lifting objects.  No numbness tingling.  She has tenderness  over the lateral aspect of the elbow.  She is also taking Tylenol for this. Review of Systems No fevers chills shortness of breath chest pain  Objective: Vital Signs: There were no vitals taken for this visit.  Physical Exam Constitutional:      Appearance: She is not ill-appearing or diaphoretic.  Pulmonary:     Effort: Pulmonary effort is normal.  Neurological:     Mental Status: She is alert and oriented to person, place, and time.  Psychiatric:        Mood and Affect: Mood normal.     Ortho Exam Right elbow wound full extension and flexion.  Full supination pronation right forearm.  Radial pulse right wrist is intact.  Tenderness over the right lateral epicondyle no tenderness over the medial epicondyle.  Provocative maneuvers cause pain at the right lateral elbow.  Specialty Comments:  No specialty comments available.  Imaging: XR Elbow Complete Right (3+View)  Result Date: 10/14/2019 Elbow 3 views: No acute fractures.  No subluxation dislocation.  No significant arthritic changes.  No bony abnormalities.    PMFS History: Patient Active Problem List   Diagnosis Date Noted  . Acute pain of right shoulder 06/26/2018  . Decreased right shoulder range of motion 06/26/2018  .  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 Pain of finger of right hand 06/26/2018  . Chemical dermatitis 03/15/2017  . Scalp pain 03/15/2017  . Itching 03/15/2017  . Hair loss 11/07/2016  . Gastroesophageal reflux disease 06/20/2016  . Mixed hyperlipidemia 05/30/2016  . Cervicalgia 05/21/2016  . Generalized anxiety disorder 05/15/2016  . Arthralgia of multiple joints 05/15/2016  . BMI 45.0-49.9, adult ([place]) 05/15/2016  . Essential hypertension 09/07/2014  . Bimalleolar fracture of left ankle 07/10/2013  . Cardiomyopathy ([place]) 07/02/2012  . Systolic heart failure ([place]) 07/02/2012  . HTN (hypertension), malignant 07/01/2012  . Pulmonary edema 07/01/2012  . Back pain, acute 07/01/2012   . Medically noncompliant 07/01/2012  . Tobacco use disorder 07/01/2012   Past Medical History:  Diagnosis Date  . Anal fissure   . Anginal pain ([place])   . [place], [place]  . Depression   . GERD (gastroesophageal reflux disease)   . History of cardiac cath 07/23/2012   Dr Irish Lack  . History of echocardiogram    Cardiomyopathy-reduced EF 35-40% with wall m otion abnormality concerning for ischemia. Hypokinesis of the anterior and anteroseptal walls to the apex  . HPV (human papilloma virus) infection   . Hyperlipidemia   . Hypertension   . PCOS (polycystic ovarian syndrome)    pt denies  . PPD positive, treated 2008   [place], [place]    Family History  Problem Relation Age of Onset  . Hypertension Mother   . Hyperlipidemia Mother   . Heart failure Paternal Grandmother   . Hypertension Paternal Grandmother   . Diabetes Paternal Grandmother   . Glaucoma Paternal Grandmother   . Other Father        drug overdose  . Drug abuse Father        overdose  . Hypertension Sister   . Cancer Maternal Grandmother   . Cirrhosis Paternal Grandfather   . Alcohol abuse Paternal Grandfather   . Cancer Other 78       breast, cervical and colon cancer    Past Surgical History:  Procedure Laterality Date  . CARDIAC CATHETERIZATION  feb, 2014   Dr Irish Lack  . ORIF ANKLE FRACTURE Left 07/10/2013   Procedure: OPEN REDUCTION INTERNAL FIXATION (ORIF) LEFT BIMALLEOLAR ANKLE FRACTURE;  Surgeon: Mcarthur Rossetti, MD;  Location: [facility];  Service: Orthopedics;  Laterality: Left;   Social History   Occupational History  . Not on file  Tobacco Use  . Smoking status: Current Every Day Smoker    Packs/day: 0.50    Years: 16.00    Pack years: 8.00  . Smokeless tobacco: Never Used  Substance and Sexual Activity  . Alcohol use: Yes    Alcohol/week: 0.0 standard drinks    Comment: rarely  . Drug use: No  . Sexual activity: Yes    Birth control/protection: I.U.D.

## 2019-10-19 ENCOUNTER — Encounter: Payer: Managed Care, Other (non HMO) | Admitting: Family Medicine

## 2019-10-20 ENCOUNTER — Encounter: Payer: Self-pay | Admitting: Family Medicine

## 2019-10-20 ENCOUNTER — Encounter: Payer: Self-pay | Admitting: Podiatry

## 2019-10-20 DIAGNOSIS — D2371 Other benign neoplasm of skin of right lower limb, including hip: Secondary | ICD-10-CM

## 2019-10-20 DIAGNOSIS — M21541 Acquired clubfoot, right foot: Secondary | ICD-10-CM

## 2019-10-20 MED ORDER — HYDROCODONE-ACETAMINOPHEN 10-325 MG PO TABS: 1.0000 | ORAL_TABLET | Freq: Three times a day (TID) | ORAL | 0 refills | Status: AC | PRN

## 2019-10-20 NOTE — Addendum Note (Signed)
Addended by: Wallene Huh on: 10/20/2019 06:45 AM   Modules accepted: Orders

## 2019-10-21 ENCOUNTER — Telehealth: Payer: Self-pay | Admitting: *Deleted

## 2019-10-21 ENCOUNTER — Encounter: Payer: Self-pay | Admitting: Podiatry

## 2019-10-21 NOTE — Telephone Encounter (Signed)
Patient called and left a message wanting to ask a few questions about the surgery patient had and I returned the phone call by leaving a message for the patient to call me back. Lattie Haw

## 2019-10-26 ENCOUNTER — Telehealth: Payer: Self-pay | Admitting: *Deleted

## 2019-10-26 NOTE — Telephone Encounter (Signed)
Called patient back to answer their question about getting an antibiotic refill. I talked to Dr. Paulla Dolly and he said patient did not need a refill for antibiotic.  I let patient know that. Patient will be coming this Wednesday for an appointment and I told patient Dr. Paulla Dolly will assess her foot and if he thought she needed another antibiotic, he would let her know.  Patient stated she understood.

## 2019-10-28 ENCOUNTER — Other Ambulatory Visit: Payer: Self-pay

## 2019-10-28 ENCOUNTER — Encounter: Payer: Self-pay | Admitting: Podiatry

## 2019-10-28 ENCOUNTER — Ambulatory Visit (INDEPENDENT_AMBULATORY_CARE_PROVIDER_SITE_OTHER): Payer: Managed Care, Other (non HMO)

## 2019-10-28 ENCOUNTER — Ambulatory Visit (INDEPENDENT_AMBULATORY_CARE_PROVIDER_SITE_OTHER): Payer: Managed Care, Other (non HMO) | Admitting: Podiatry

## 2019-10-28 VITALS — BP 146/85 | HR 76 | Temp 96.7°F | Resp 16

## 2019-10-28 DIAGNOSIS — M779 Enthesopathy, unspecified: Secondary | ICD-10-CM

## 2019-10-28 NOTE — Progress Notes (Signed)
Subjective:   Patient ID: Tammy Hogan, female   DOB: 44 y.o.   MRN: [id number]   HPI Patient states right foot is doing well and very pleased so far with how she is doing and has returned to work   ROS      Objective:  Physical Exam  Neurovascular status intact negative Bevelyn Buckles' sign noted wound edges right healing well with stitches intact both dorsal plantar     Assessment:  Doing well post fifth metatarsal head resection with resection of large lesion right     Plan:  H&P reviewed condition x-ray and reapplied sterile dressing and reappoint 2 weeks suture removal or earlier if needed  X-rays indicate satisfactory resection head of fifth metatarsal right foot

## 2019-11-02 ENCOUNTER — Other Ambulatory Visit: Payer: Self-pay | Admitting: Interventional Cardiology

## 2019-11-02 DIAGNOSIS — I10 Essential (primary) hypertension: Secondary | ICD-10-CM

## 2019-11-11 ENCOUNTER — Ambulatory Visit (INDEPENDENT_AMBULATORY_CARE_PROVIDER_SITE_OTHER): Payer: Managed Care, Other (non HMO) | Admitting: Podiatry

## 2019-11-11 ENCOUNTER — Ambulatory Visit (INDEPENDENT_AMBULATORY_CARE_PROVIDER_SITE_OTHER): Payer: Managed Care, Other (non HMO)

## 2019-11-11 ENCOUNTER — Encounter: Payer: Self-pay | Admitting: Podiatry

## 2019-11-11 ENCOUNTER — Other Ambulatory Visit: Payer: Self-pay

## 2019-11-11 VITALS — Temp 97.1°F

## 2019-11-11 DIAGNOSIS — M21621 Bunionette of right foot: Secondary | ICD-10-CM

## 2019-11-11 NOTE — Progress Notes (Signed)
Subjective:   Patient ID: Tammy Hogan, female   DOB: 44 y.o.   MRN: [id number]   HPI Patient states overall doing well with swelling if I been on my foot for too long of a time   ROS      Objective:  Physical Exam  Neurovascular status intact negative Bevelyn Buckles' sign noted wound edges well coapted for fifth metatarsal dorsal plantar with stitches in place     Assessment:  Doing well post foot surgery right     Plan:  All stitches removed x-rays reviewed and ankle compression stocking dispensed with instructions for elevation and to be seen back in approximately 6 weeks or earlier if needed with return to shoe gear recommended now  X-rays indicate satisfactory resection fifth metatarsal head right no indications of other pathology

## 2019-11-25 ENCOUNTER — Encounter: Payer: Managed Care, Other (non HMO) | Admitting: Podiatry

## 2019-12-23 ENCOUNTER — Encounter: Payer: Self-pay | Admitting: Podiatry

## 2019-12-23 ENCOUNTER — Other Ambulatory Visit: Payer: Self-pay

## 2019-12-23 ENCOUNTER — Ambulatory Visit (INDEPENDENT_AMBULATORY_CARE_PROVIDER_SITE_OTHER): Payer: Managed Care, Other (non HMO) | Admitting: Podiatry

## 2019-12-23 ENCOUNTER — Ambulatory Visit (INDEPENDENT_AMBULATORY_CARE_PROVIDER_SITE_OTHER): Payer: Managed Care, Other (non HMO)

## 2019-12-23 VITALS — Temp 97.3°F

## 2019-12-23 DIAGNOSIS — M21621 Bunionette of right foot: Secondary | ICD-10-CM

## 2019-12-23 DIAGNOSIS — L03119 Cellulitis of unspecified part of limb: Secondary | ICD-10-CM

## 2019-12-23 DIAGNOSIS — L02619 Cutaneous abscess of unspecified foot: Secondary | ICD-10-CM | POA: Diagnosis not present

## 2019-12-23 MED ORDER — DOXYCYCLINE HYCLATE 100 MG PO TABS
100.0000 mg | ORAL_TABLET | Freq: Two times a day (BID) | ORAL | 0 refills | Status: DC
Start: 2019-12-23 — End: 2020-04-13

## 2019-12-23 NOTE — Progress Notes (Signed)
Subjective:   Patient ID: Tammy Hogan, female   DOB: 44 y.o.   MRN: [id number]   HPI Patient presents stating of had some discomfort underneath the foot and it feels somewhat inflamed.  I am doing well with surgery and I am not having the same symptoms I had prior   ROS      Objective:  Physical Exam  Neurovascular status intact with some keratotic tissue plantar right with slight area of redness localized around the suture site.  No proximal edema erythema drainage noted and it is quite sensitive in this area     Assessment:  Possibility for low-grade abscess of this area plantar right fifth metatarsal     Plan:  H&P x-ray reviewed and today I did a proximal nerve block of the area.  Sterile application around the area and using sterile instrumentation I opened the area up and I did find a very light amount of low-grade drainage that is not purulent currently I cleaned this up there was a small piece of stitch which I removed I flushed the area applied sterile dressing and place patient on doxycycline 100 mg twice daily.  Gave strict instructions of any redness erythema edema drainage or pain were to occur to let me know right away  X-ray indicates satisfactory section head fifth metatarsal no indications of lysis

## 2020-01-04 ENCOUNTER — Other Ambulatory Visit: Payer: Self-pay | Admitting: Family Medicine

## 2020-04-02 ENCOUNTER — Other Ambulatory Visit: Payer: Self-pay | Admitting: Interventional Cardiology

## 2020-04-02 DIAGNOSIS — I10 Essential (primary) hypertension: Secondary | ICD-10-CM

## 2020-04-12 NOTE — Progress Notes (Signed)
Cardiology Office Note   Date:  04/13/2020   ID:  Tammy Hogan, DOB [birth year], MRN [id number]  PCP:  Forrest Moron, MD    No chief complaint on file.  NICM  Wt Readings from Last 3 Encounters:  04/13/20 260 lb (117.9 kg)  10/07/19 258 lb 3.2 oz (117.1 kg)  08/05/19 254 lb 3.2 oz (115.3 kg)       History of Present Illness: Tammy Hogan is a 44 y.o. female    hasobesity, PCOS and a nonischemic cardiomyopathy-diagnosed in 2014. She has had difficulty controlled her BP. No CAD by cath in 2/14.  She has had trouble stopping smoking.  Normal LV function noted in 2/16. Shewas concerned about her hair loss and that the BP meds are contributing.Tammy Hogan  Recently,Shewasworking for the city of [place].She enjoyedher job, but was terminated in 09/2018. She was fighting legally for her job.  Since the last visit, she is now working for city of [employer].  She got married in 2021.  She is not interested in children and we discussed that pregnancy would be high risk for her given NICM.   Mild activity, not as much as she would like.  She had COVID in Jan and had only mild sx.  Her fiancee had it at the time as well.   She got vaccinated in June 2021.   Since the last visit, she has felt well.  BP has been controlled.   Denies : Chest pain. Dizziness. Leg edema. Nitroglycerin use.  Palpitations. Paroxysmal nocturnal dyspnea. Shortness of breath. Syncope.   Rare orthopnea- resolved.    Cutting back on cigarettes.  Down to 3/day.    She is not exercising much.      Past Medical History:  Diagnosis Date   Anal fissure    Anginal pain ([place])    Anxiety    [place], [place]   Depression    GERD (gastroesophageal reflux disease)    History of cardiac cath 07/23/2012   Dr Irish Lack   History of echocardiogram    Cardiomyopathy-reduced EF 35-40% with wall m otion abnormality concerning for ischemia. Hypokinesis of the anterior and anteroseptal  walls to the apex   HPV (human papilloma virus) infection    Hyperlipidemia    Hypertension    PCOS (polycystic ovarian syndrome)    pt denies   PPD positive, treated 2008   [place], [place]    Past Surgical History:  Procedure Laterality Date   CARDIAC CATHETERIZATION  feb, 2014   Dr Irish Lack   ORIF ANKLE FRACTURE Left 07/10/2013   Procedure: OPEN REDUCTION INTERNAL FIXATION (ORIF) LEFT BIMALLEOLAR ANKLE FRACTURE;  Surgeon: Mcarthur Rossetti, MD;  Location: [facility];  Service: Orthopedics;  Laterality: Left;     Current Outpatient Medications  Medication Sig Dispense Refill   amLODipine (NORVASC) 10 MG tablet Take 1 tablet (10 mg total) by mouth daily. 90 tablet 3   aspirin 81 MG tablet Take 81 mg by mouth daily.     atorvastatin (LIPITOR) 10 MG tablet TAKE ONE TABLET BY MOUTH DAILY 90 tablet 3   buPROPion (WELLBUTRIN XL) 300 MG 24 hr tablet TAKE ONE TABLET BY MOUTH DAILY 90 tablet 0   carvedilol (COREG) 25 MG tablet Take 1 tablet (25 mg total) by mouth 2 (two) times daily. 180 tablet 3   clobetasol (TEMOVATE) 0.05 % external solution clobetasol 0.05 % scalp solution  APP 1 ML EXT AA D     clonazePAM (  KLONOPIN) 0.5 MG tablet TAKE ONE TABLET BY MOUTH EVERY NIGHT AT BEDTIME AS NEEDED 30 tablet 0   finasteride (PROSCAR) 5 MG tablet Take 1 mg by mouth daily.     furosemide (LASIX) 20 MG tablet TAKE ONE TABLET BY MOUTH DAILY **PLEASE SCHEDULE APPOINTMENT FOR FURTHER REFILLS [phone]** 90 tablet 3   gabapentin (NEURONTIN) 300 MG capsule TAKE ONE CAPSULE BY MOUTH EVERY NIGHT AT BEDTIME 90 capsule 0   levonorgestrel (MIRENA) 20 MCG/24HR IUD 1 each by Intrauterine route once.     lisinopril (ZESTRIL) 40 MG tablet TAKE ONE TABLET BY MOUTH DAILY 90 tablet 1   nitroGLYCERIN (NITROSTAT) 0.4 MG SL tablet Place 1 tablet (0.4 mg total) under the tongue every 5 (five) minutes as needed for chest pain. 25 tablet 3   pantoprazole (PROTONIX) 40 MG tablet Take 1 tablet (40 mg  total) by mouth daily. 90 tablet 3   sertraline (ZOLOFT) 25 MG tablet Take 37.5 mg by mouth daily.     spironolactone (ALDACTONE) 50 MG tablet Take 50 mg by mouth daily.     No current facility-administered medications for this visit.    Allergies:   Percocet [oxycodone-acetaminophen]    Social History:  The patient  reports that she has been smoking. She has a 8.00 pack-year smoking history. She has never used smokeless tobacco. She reports current alcohol use. She reports that she does not use drugs.   Family History:  The patient's family history includes Alcohol abuse in her paternal grandfather; Cancer in her maternal grandmother; Cancer (age of onset: 1) in an other family member; Cirrhosis in her paternal grandfather; Diabetes in her paternal grandmother; Drug abuse in her father; Glaucoma in her paternal grandmother; Heart failure in her paternal grandmother; Hyperlipidemia in her mother; Hypertension in her mother, paternal grandmother, and sister; Other in her father.    ROS:  Please see the history of present illness.   Otherwise, review of systems are positive for occasional shortness of breath.   All other systems are reviewed and negative.    PHYSICAL EXAM: VS:  BP 138/82    Pulse 85    Ht 5\' 4"  (1.626 m)    Wt 260 lb (117.9 kg)    SpO2 99%    BMI 44.63 kg/m  , BMI Body mass index is 44.63 kg/m. GEN: Well nourished, well developed, in no acute distress  HEENT: normal  Neck: no JVD, carotid bruits, or masses Cardiac: RRR; no murmurs, rubs, or gallops,no edema  Respiratory:  clear to auscultation bilaterally, normal work of breathing GI: soft, nontender, nondistended, + BS MS: no deformity or atrophy  Skin: warm and dry, no rash Neuro:  Strength and sensation are intact Psych: euthymic mood, full affect   EKG:   The ekg ordered 4/21 demonstrates NSR, no ST changes   Recent Labs: 08/05/2019: BUN 11; Creatinine, Ser 0.81; Hemoglobin 13.5; Platelets 444; Potassium  4.4; Sodium 136; TSH 1.940   Lipid Panel    Component Value Date/Time   CHOL 150 03/11/2017 0950   TRIG 146 03/11/2017 0950   HDL 39 (L) 03/11/2017 0950   CHOLHDL 3.8 03/11/2017 0950   CHOLHDL 5.6 07/03/2012 0510   VLDL 35 07/03/2012 0510   LDLCALC 82 03/11/2017 0950     Other studies Reviewed: Additional studies/ records that were reviewed today with results demonstrating: labs reviewed; normal Cr, K in 2021.   ASSESSMENT AND PLAN:  1. NICM: SOme SHOB.  Appears euvolemic.  WIll check echo.  2.  Hypertensive heart disease: 138/82 today.  Increase walking to 150 min/week.  THis may help get her closer to 130/80.  Valsartan was tried, but it was expensive.  3. Morbid obesity: We spoke about healthy diet and regular exercise.     Current medicines are reviewed at length with the patient today.  The patient concerns regarding her medicines were addressed.  The following changes have been made:  No change  Labs/ tests ordered today include:  No orders of the defined types were placed in this encounter.   Recommend 150 minutes/week of aerobic exercise Low fat, low carb, high fiber diet recommended  Disposition:   FU in 1 year   Signed, Larae Grooms, MD  04/13/2020 4:37 PM    [facility] [address], [place], [place]  [address] Phone: [phone]; Fax: [phone]

## 2020-04-13 ENCOUNTER — Encounter: Payer: Self-pay | Admitting: Interventional Cardiology

## 2020-04-13 ENCOUNTER — Ambulatory Visit: Payer: Managed Care, Other (non HMO) | Admitting: Interventional Cardiology

## 2020-04-13 VITALS — BP 138/82 | HR 85 | Ht 64.0 in | Wt 260.0 lb

## 2020-04-13 DIAGNOSIS — I428 Other cardiomyopathies: Secondary | ICD-10-CM | POA: Diagnosis not present

## 2020-04-13 DIAGNOSIS — I10 Essential (primary) hypertension: Secondary | ICD-10-CM

## 2020-04-13 DIAGNOSIS — I119 Hypertensive heart disease without heart failure: Secondary | ICD-10-CM | POA: Diagnosis not present

## 2020-04-13 DIAGNOSIS — R0602 Shortness of breath: Secondary | ICD-10-CM | POA: Diagnosis not present

## 2020-04-13 DIAGNOSIS — Z6841 Body Mass Index (BMI) 40.0 and over, adult: Secondary | ICD-10-CM

## 2020-04-13 NOTE — Patient Instructions (Signed)
Medication Instructions:  Your physician recommends that you continue on your current medications as directed. Please refer to the Current Medication list given to you today.  *If you need a refill on your cardiac medications before your next appointment, please call your pharmacy*   Lab Work: None If you have labs (blood work) drawn today and your tests are completely normal, you will receive your results only by:  [place] (if you have MyChart) OR  A paper copy in the mail If you have any lab test that is abnormal or we need to change your treatment, we will call you to review the results.   Testing/Procedures: Your physician has requested that you have an echocardiogram. Echocardiography is a painless test that uses sound waves to create images of your heart. It provides your doctor with information about the size and shape of your heart and how well your hearts chambers and valves are working. This procedure takes approximately one hour. There are no restrictions for this procedure.  Follow-Up: At [facility], you and your health needs are our priority.  As part of our continuing mission to provide you with exceptional heart care, we have created designated Provider Care Teams.  These Care Teams include your primary Cardiologist (physician) and Advanced Practice Providers (APPs -  Physician Assistants and Nurse Practitioners) who all work together to provide you with the care you need, when you need it.  We recommend signing up for the patient portal called "MyChart".  Sign up information is provided on this After Visit Summary.  MyChart is used to connect with patients for Virtual Visits (Telemedicine).  Patients are able to view lab/test results, encounter notes, upcoming appointments, etc.  Non-urgent messages can be sent to your provider as well.   To learn more about what you can do with MyChart, go to [email or web address].    Your next appointment:   1 year(s)  The  format for your next appointment:   In Person  Provider:   You may see Larae Grooms, MD or one of the following Advanced Practice Providers on your designated Care Team:    Melina Copa, PA-C  Ermalinda Barrios, PA-C

## 2020-05-04 ENCOUNTER — Other Ambulatory Visit (HOSPITAL_COMMUNITY): Payer: Managed Care, Other (non HMO)

## 2020-05-04 ENCOUNTER — Encounter (HOSPITAL_COMMUNITY): Payer: Self-pay

## 2020-05-04 NOTE — Progress Notes (Signed)
Verified appointment "no show" status with A. Carter at 07:30.

## 2020-05-09 ENCOUNTER — Encounter (HOSPITAL_COMMUNITY): Payer: Self-pay | Admitting: Interventional Cardiology

## 2020-05-16 ENCOUNTER — Ambulatory Visit: Payer: Managed Care, Other (non HMO) | Admitting: Podiatry

## 2020-05-19 ENCOUNTER — Telehealth (HOSPITAL_COMMUNITY): Payer: Self-pay | Admitting: Interventional Cardiology

## 2020-05-19 NOTE — Telephone Encounter (Signed)
Just an FYI. We have made several attempts to contact this patient including sending a letter to schedule or reschedule their echocardiogram. We will be removing the patient from the echo [place].   05/04/20 NO SHOW- MAILED LETTER/LBW   Thank you

## 2020-05-23 ENCOUNTER — Ambulatory Visit: Payer: Managed Care, Other (non HMO) | Admitting: Podiatry

## 2020-05-26 ENCOUNTER — Ambulatory Visit: Payer: Managed Care, Other (non HMO) | Admitting: Podiatry

## 2020-05-28 ENCOUNTER — Ambulatory Visit: Payer: Managed Care, Other (non HMO)

## 2020-06-08 ENCOUNTER — Other Ambulatory Visit: Payer: Self-pay

## 2020-06-08 ENCOUNTER — Ambulatory Visit (HOSPITAL_COMMUNITY): Payer: Managed Care, Other (non HMO) | Attending: Internal Medicine

## 2020-06-08 ENCOUNTER — Ambulatory Visit: Payer: Managed Care, Other (non HMO) | Admitting: Podiatry

## 2020-06-08 DIAGNOSIS — I119 Hypertensive heart disease without heart failure: Secondary | ICD-10-CM | POA: Diagnosis not present

## 2020-06-08 DIAGNOSIS — Z6841 Body Mass Index (BMI) 40.0 and over, adult: Secondary | ICD-10-CM | POA: Diagnosis present

## 2020-06-08 DIAGNOSIS — L600 Ingrowing nail: Secondary | ICD-10-CM | POA: Diagnosis not present

## 2020-06-08 DIAGNOSIS — I428 Other cardiomyopathies: Secondary | ICD-10-CM

## 2020-06-08 DIAGNOSIS — M21621 Bunionette of right foot: Secondary | ICD-10-CM

## 2020-06-08 DIAGNOSIS — R0602 Shortness of breath: Secondary | ICD-10-CM

## 2020-06-08 LAB — ECHOCARDIOGRAM COMPLETE
Area-P 1/2: 4.63 cm2
S' Lateral: 3.3 cm

## 2020-06-08 MED ORDER — HYDROCODONE-ACETAMINOPHEN 10-325 MG PO TABS: 1.0000 | ORAL_TABLET | Freq: Three times a day (TID) | ORAL | 0 refills | Status: AC | PRN

## 2020-06-08 MED ORDER — PERFLUTREN LIPID MICROSPHERE
1.0000 mL | INTRAVENOUS | Status: AC | PRN
  Administered 2020-06-08: 3 mL via INTRAVENOUS

## 2020-06-08 MED ORDER — NEOMYCIN-POLYMYXIN-HC 3.5-10000-1 OT SOLN
OTIC | 0 refills | Status: DC
Start: 2020-06-08 — End: 2020-07-04

## 2020-06-08 NOTE — Patient Instructions (Signed)

## 2020-06-08 NOTE — Progress Notes (Signed)
Subjective:   Patient ID: Tammy Hogan, female   DOB: 44 y.o.   MRN: [id number]   HPI Patient presents stating that she is doing pretty well with her right fifth metatarsal but wanted it checked and states her left hallux toenail has an ingrown toenail that is sore   ROS      Objective:  Physical Exam  Neurovascular status intact with slight keratotic lesion subfifth metatarsal right but better than previous with a incurvated medial border left hallux painful when palpated     Assessment:  Improvement from previous fifth metatarsal head resection with removal of plantar skin wedge with ingrown toenail left hallux medial border     Plan:  H&P reviewed both conditions and for the right continue with supportive shoes trimming as needed and it should continue to do well the left recommended correction allowed her to read and then signed consent form understanding risk and today I infiltrated the left hallux 60 mg like Marcaine mixture sterile prep done using sterile instrumentation remove the medial border exposed matrix applied phenol 3 applications 30 seconds followed by alcohol lavage and sterile dressing and gave instructions for soaks and to leave dressing on 24 hours early and take it off as needed prior if symptoms indicate and encouraged to call questions concerns

## 2020-06-27 ENCOUNTER — Other Ambulatory Visit: Payer: Self-pay

## 2020-07-04 ENCOUNTER — Other Ambulatory Visit: Payer: Self-pay

## 2020-07-04 ENCOUNTER — Ambulatory Visit (AMBULATORY_SURGERY_CENTER): Payer: Managed Care, Other (non HMO) | Admitting: *Deleted

## 2020-07-04 VITALS — Ht 64.0 in | Wt 250.0 lb

## 2020-07-04 DIAGNOSIS — Z1211 Encounter for screening for malignant neoplasm of colon: Secondary | ICD-10-CM

## 2020-07-04 MED ORDER — SUTAB 1479-225-188 MG PO TABS: 24.0000 | ORAL_TABLET | ORAL | 0 refills | Status: DC

## 2020-07-04 NOTE — Progress Notes (Signed)
No egg or soy allergy known to patient  No issues with past sedation with any surgeries or procedures No intubation problems in the past  No FH of Malignant Hyperthermia No diet pills per patient No home 02 use per patient  No blood thinners per patient  Pt denies issues with constipation  No A fib or A flutter  EMMI video to pt or via [place] 19 guidelines implemented in PV today with Pt and RN  Pt is fully vaccinated  for [employer] given to pt in PV today , Code to Pharmacy   Due to the COVID-19 pandemic we are asking patients to follow certain guidelines.  Pt aware of COVID protocols and [facility] guidelines   Pt verified name, DOB, address and insurance during PV today. Pt mailed instruction packet to included paper to complete and mail back to [facility] with addressed and stamped envelope, Emmi video, copy of consent form to read and not return, and instructions. [employer]  coupon mailed in packet. PV completed over the phone. Pt encouraged to call with questions or issues - My chart instructions to pt today - we discused NO PA's for preps at [facility] as well

## 2020-07-05 ENCOUNTER — Encounter: Payer: Managed Care, Other (non HMO) | Admitting: Gastroenterology

## 2020-07-06 ENCOUNTER — Encounter: Payer: Self-pay | Admitting: Internal Medicine

## 2020-07-11 ENCOUNTER — Encounter: Payer: Managed Care, Other (non HMO) | Admitting: Internal Medicine

## 2020-07-13 ENCOUNTER — Other Ambulatory Visit: Payer: Self-pay

## 2020-07-13 ENCOUNTER — Encounter: Payer: Self-pay | Admitting: Internal Medicine

## 2020-07-13 ENCOUNTER — Ambulatory Visit (AMBULATORY_SURGERY_CENTER): Payer: Managed Care, Other (non HMO) | Admitting: Internal Medicine

## 2020-07-13 VITALS — BP 177/99 | HR 87 | Temp 97.3°F | Resp 13 | Ht 64.0 in | Wt 260.0 lb

## 2020-07-13 DIAGNOSIS — Z1211 Encounter for screening for malignant neoplasm of colon: Secondary | ICD-10-CM | POA: Diagnosis present

## 2020-07-13 DIAGNOSIS — D125 Benign neoplasm of sigmoid colon: Secondary | ICD-10-CM

## 2020-07-13 MED ORDER — AMBULATORY NON FORMULARY MEDICATION: 3 refills | Status: DC

## 2020-07-13 MED ORDER — SODIUM CHLORIDE 0.9 % IV SOLN: 500.0000 mL | Freq: Once | INTRAVENOUS | Status: DC

## 2020-07-13 NOTE — Progress Notes (Signed)
Pt Drowsy. VSS. To PACU, report to RN. No anesthetic complications noted.  

## 2020-07-13 NOTE — Progress Notes (Signed)
Called to room to assist during endoscopic procedure.  Patient ID and intended procedure confirmed with present staff. Received instructions for my participation in the procedure from the performing physician.  

## 2020-07-13 NOTE — Op Note (Signed)
[place] Patient Name: Tammy Hogan Procedure Date: 07/13/2020 10:11 AM MRN: [id number] Endoscopist: Docia Chuck. Henrene Pastor , MD Age: 45 Referring MD:  Date of Birth: 09-Apr-1976 Gender: Female Account #: [id number] Procedure:                Colonoscopy with cold snare polypectomy x 1 Indications:              Screening for colorectal malignant neoplasm. Note:                            Patient also complains of symptomatic anal fissure Medicines:                Monitored Anesthesia Care Procedure:                Pre-Anesthesia Assessment:                           - Prior to the procedure, a History and Physical                            was performed, and patient medications and                            allergies were reviewed. The patient's tolerance of                            previous anesthesia was also reviewed. The risks                            and benefits of the procedure and the sedation                            options and risks were discussed with the patient.                            All questions were answered, and informed consent                            was obtained. Prior Anticoagulants: The patient has                            taken no previous anticoagulant or antiplatelet                            agents. After reviewing the risks and benefits, the                            patient was deemed in satisfactory condition to                            undergo the procedure.                           After obtaining informed consent, the colonoscope  was passed under direct vision. Throughout the                            procedure, the patient's blood pressure, pulse, and                            oxygen saturations were monitored continuously. The                            Olympus CF-[id number]) Colonoscope was                            introduced through the anus and advanced to the the                             cecum, identified by appendiceal orifice and                            ileocecal valve. The ileocecal valve, appendiceal                            orifice, and rectum were photographed. The quality                            of the bowel preparation was excellent. The                            colonoscopy was performed without difficulty. The                            patient tolerated the procedure well. The bowel                            preparation used was SUPREP via split dose                            instruction. Scope In: 10:26:18 AM Scope Out: 10:38:22 AM Scope Withdrawal Time: 0 hours 9 minutes 13 seconds  Total Procedure Duration: 0 hours 12 minutes 4 seconds  Findings:                 A 3 mm polyp was found in the sigmoid colon. The                            polyp was sessile. The polyp was removed with a                            cold snare. Resection and retrieval were complete.                           The exam was otherwise without abnormality on                            direct and retroflexion views. Complications:  No immediate complications. Estimated blood loss:                            None. Estimated Blood Loss:     Estimated blood loss: none. Impression:               - One 3 mm polyp in the sigmoid colon, removed with                            a cold snare. Resected and retrieved.                           - The examination was otherwise normal on direct                            and retroflexion views. Recommendation:           - Repeat colonoscopy in 7 or 10 years for                            surveillance, based on final pathology.                           - Patient has a contact number available for                            emergencies. The signs and symptoms of potential                            delayed complications were discussed with the                            patient. Return to normal activities tomorrow.                             Written discharge instructions were provided to the                            patient.                           - Resume previous diet.                           - Continue present medications.                           - Await pathology results.                           - Prescribe 2% diltiazem ointment ([facility]                            [facility]); apply to anal sphincter 5 times daily as  instructed by Dr. Henrene Pastor; dispense 1 quantity; 3                            refills                           - Daily fortified fiber in the form of Citrucel 2                            tablespoons daily to improve the consistency of                            bowel habits Tammy Hogan N. Henrene Pastor, MD 07/13/2020 10:44:09 AM This report has been signed electronically.

## 2020-07-13 NOTE — Progress Notes (Signed)
Vitals by CW 

## 2020-07-13 NOTE — Patient Instructions (Signed)
YOU HAD AN ENDOSCOPIC PROCEDURE TODAY AT THE [facility]:   Refer to the procedure report that was given to you for any specific questions about what was found during the examination.  If the procedure report does not answer your questions, please call your gastroenterologist to clarify.  If you requested that your care partner not be given the details of your procedure findings, then the procedure report has been included in a sealed envelope for you to review at your convenience later.  YOU SHOULD EXPECT: Some feelings of bloating in the abdomen. Passage of more gas than usual.  Walking can help get rid of the air that was put into your GI tract during the procedure and reduce the bloating. If you had a lower endoscopy (such as a colonoscopy or flexible sigmoidoscopy) you may notice spotting of blood in your stool or on the toilet paper. If you underwent a bowel prep for your procedure, you may not have a normal bowel movement for a few days.  Please Note:  You might notice some irritation and congestion in your nose or some drainage.  This is from the oxygen used during your procedure.  There is no need for concern and it should clear up in a day or so.  SYMPTOMS TO REPORT IMMEDIATELY:   Following lower endoscopy (colonoscopy or flexible sigmoidoscopy):  Excessive amounts of blood in the stool  Significant tenderness or worsening of abdominal pains  Swelling of the abdomen that is new, acute  Fever of 100F or higher  For urgent or emergent issues, a gastroenterologist can be reached at any hour by calling [phone]. Do not use MyChart messaging for urgent concerns.    DIET:  We do recommend a small meal at first, but then you may proceed to your regular diet.  Drink plenty of fluids but you should avoid alcoholic beverages for 24 hours.  ACTIVITY:  You should plan to take it easy for the rest of today and you should NOT DRIVE or use heavy machinery until tomorrow (because  of the sedation medicines used during the test).    FOLLOW UP: Our staff will call the number listed on your records 48-72 hours following your procedure to check on you and address any questions or concerns that you may have regarding the information given to you following your procedure. If we do not reach you, we will leave a message.  We will attempt to reach you two times.  During this call, we will ask if you have developed any symptoms of COVID 19. If you develop any symptoms (ie: fever, flu-like symptoms, shortness of breath, cough etc.) before then, please call [phone].  If you test positive for Covid 19 in the 2 weeks post procedure, please call and report this information to us.    If any biopsies were taken you will be contacted by phone or by letter within the next 1-3 weeks.  Please call us at [phone] if you have not heard about the biopsies in 3 weeks.    SIGNATURES/CONFIDENTIALITY: You and/or your care partner have signed paperwork which will be entered into your electronic medical record.  These signatures attest to the fact that that the information above on your After Visit Summary has been reviewed and is understood.  Full responsibility of the confidentiality of this discharge information lies with you and/or your care-partner. 

## 2020-07-15 ENCOUNTER — Telehealth: Payer: Self-pay | Admitting: *Deleted

## 2020-07-15 NOTE — Telephone Encounter (Signed)
  Follow up Call-  Call back number 07/13/2020 03/28/2018  Post procedure Call Back phone  # [phone]  Permission to leave phone message Yes Yes  Some recent data might be hidden     Patient questions:  Do you have a fever, pain , or abdominal swelling? No. Pain Score  0 *  Have you tolerated food without any problems? Yes.    Have you been able to return to your normal activities? Yes.    Do you have any questions about your discharge instructions: Diet   No. Medications  No. Follow up visit  No.  Do you have questions or concerns about your Care? No.  Actions: * If pain score is 4 or above: No action needed, pain <4.  1. Have you developed a fever since your procedure? no  2.   Have you had an respiratory symptoms (SOB or cough) since your procedure? no  3.   Have you tested positive for COVID 19 since your procedure no  4.   Have you had any family members/close contacts diagnosed with the COVID 19 since your procedure?  no   If yes to any of these questions please route to Joylene John, RN and Joella Prince, RN

## 2020-07-20 ENCOUNTER — Encounter: Payer: Self-pay | Admitting: Internal Medicine

## 2020-07-20 ENCOUNTER — Other Ambulatory Visit: Payer: Self-pay

## 2020-07-20 ENCOUNTER — Ambulatory Visit: Payer: Managed Care, Other (non HMO) | Admitting: Internal Medicine

## 2020-07-20 VITALS — BP 110/80 | HR 78 | Temp 98.0°F | Ht 64.0 in | Wt 266.7 lb

## 2020-07-20 DIAGNOSIS — I5042 Chronic combined systolic (congestive) and diastolic (congestive) heart failure: Secondary | ICD-10-CM

## 2020-07-20 DIAGNOSIS — L659 Nonscarring hair loss, unspecified: Secondary | ICD-10-CM

## 2020-07-20 DIAGNOSIS — F17200 Nicotine dependence, unspecified, uncomplicated: Secondary | ICD-10-CM

## 2020-07-20 DIAGNOSIS — G5601 Carpal tunnel syndrome, right upper limb: Secondary | ICD-10-CM

## 2020-07-20 DIAGNOSIS — E782 Mixed hyperlipidemia: Secondary | ICD-10-CM

## 2020-07-20 DIAGNOSIS — K219 Gastro-esophageal reflux disease without esophagitis: Secondary | ICD-10-CM

## 2020-07-20 DIAGNOSIS — F411 Generalized anxiety disorder: Secondary | ICD-10-CM | POA: Diagnosis not present

## 2020-07-20 DIAGNOSIS — I10 Essential (primary) hypertension: Secondary | ICD-10-CM

## 2020-07-20 MED ORDER — GABAPENTIN 300 MG PO CAPS: 300.0000 mg | ORAL_CAPSULE | Freq: Every day | ORAL | 1 refills | Status: DC

## 2020-07-20 MED ORDER — PANTOPRAZOLE SODIUM 40 MG PO TBEC: 40.0000 mg | DELAYED_RELEASE_TABLET | Freq: Every day | ORAL | 1 refills | Status: DC

## 2020-07-20 MED ORDER — CLONAZEPAM 0.5 MG PO TABS
ORAL_TABLET | ORAL | 0 refills | Status: DC
Start: 2020-07-20 — End: 2021-04-21

## 2020-07-20 NOTE — Patient Instructions (Signed)
-  Nice seeing you today!!  -Remember your COVID booster.  -Remember to schedule follow up with GYN including your mammogram.  -Schedule follow up for your physical in 3 months. Please come in fasting that day.

## 2020-07-20 NOTE — Progress Notes (Signed)
New Patient Office Visit     This visit occurred during the SARS-CoV-2 public health emergency.  Safety protocols were in place, including screening questions prior to the visit, additional usage of staff PPE, and extensive cleaning of exam room while observing appropriate contact time as indicated for disinfecting solutions.    CC/Reason for Visit: Establish care, discuss chronic conditions, medication refills Previous PCP: Delia Chimes, MD Last Visit: Unknown  HPI: Tammy Hogan is a 45 y.o. female who is coming in today for the above mentioned reasons. Past Medical History is significant for: Morbid obesity, hypertension, GERD, combined congestive heart failure, right greater than left carpal tunnel syndrome, hair loss followed by dermatology, nicotine dependence as well as anxiety/depression.  She works for the city of [employer], she is recently married, she smokes about 4 to 5 cigarettes and has done so for about 12 years.  She does not drink alcohol.  Her family history significant for a mother sister and a maternal grandmother with hypertension, her maternal grandmother also had diabetes.  She has a gynecologist and had her most recent Pap smear in January 2021.  At that time she also had a negative mammogram.  She has had 2 COVID vaccines and is overdue for her booster.  She believes she has had her flu shot this year although we do not have proof of this.  She just had a colonoscopy last week.  She is requesting refills of Protonix, gabapentin that she takes for carpal tunnel as well as Klonopin that she takes as needed for anxiety with flying.   Past Medical/Surgical History: Past Medical History:  Diagnosis Date  . Anal fissure   . Anemia    past hx   . Anginal pain ([place])   . Anxiety    [place], [place]  . CHF (congestive heart failure) ([place])    systolic heart failure 4580  . Depression   . GERD (gastroesophageal reflux disease)   . History of cardiac cath  07/23/2012   Dr Irish Lack  . History of echocardiogram    Cardiomyopathy-reduced EF 35-40% with wall m otion abnormality concerning for ischemia. Hypokinesis of the anterior and anteroseptal walls to the apex  . HPV (human papilloma virus) infection   . Hyperlipidemia   . Hypertension   . PCOS (polycystic ovarian syndrome)    pt denies  . PPD positive, treated 2008   [place],     Past Surgical History:  Procedure Laterality Date  . CARDIAC CATHETERIZATION  feb, 2014   Dr Irish Lack  . FOOT SURGERY  10/2019  . ORIF ANKLE FRACTURE Left 07/10/2013   Procedure: OPEN REDUCTION INTERNAL FIXATION (ORIF) LEFT BIMALLEOLAR ANKLE FRACTURE;  Surgeon: Mcarthur Rossetti, MD;  Location: [facility];  Service: Orthopedics;  Laterality: Left;    Social History:  reports that she has been smoking. She has a 3.20 pack-year smoking history. She has never used smokeless tobacco. She reports previous alcohol use. She reports that she does not use drugs.  Allergies: Allergies  Allergen Reactions  . Percocet [Oxycodone-Acetaminophen] Nausea Only    Family History:  Family History  Problem Relation Age of Onset  . Hypertension Mother   . Hyperlipidemia Mother   . Colon polyps Mother   . Heart failure Paternal Grandmother   . Hypertension Paternal Grandmother   . Diabetes Paternal Grandmother   . Glaucoma Paternal Grandmother   . Other Father        drug overdose  . Drug  abuse Father        overdose  . Hypertension Sister   . Cancer Maternal Grandmother   . Cirrhosis Paternal Grandfather   . Alcohol abuse Paternal Grandfather   . Cancer Other 78       breast, cervical and colon cancer  . Colon cancer Neg Hx   . Esophageal cancer Neg Hx   . Rectal cancer Neg Hx   . Stomach cancer Neg Hx      Current Outpatient Medications:  .  AMBULATORY NON FORMULARY MEDICATION, Medication Name: Diltiazem ointment 2%- apply to anal sphincter 5 times a day as instructed per Dr. Henrene Pastor, Disp: 30 g, Rfl:  3 .  amLODipine (NORVASC) 10 MG tablet, Take 1 tablet (10 mg total) by mouth daily., Disp: 90 tablet, Rfl: 3 .  aspirin 81 MG tablet, Take 81 mg by mouth daily., Disp: , Rfl:  .  atorvastatin (LIPITOR) 10 MG tablet, TAKE ONE TABLET BY MOUTH DAILY, Disp: 90 tablet, Rfl: 3 .  buPROPion (WELLBUTRIN XL) 300 MG 24 hr tablet, TAKE ONE TABLET BY MOUTH DAILY, Disp: 90 tablet, Rfl: 0 .  carvedilol (COREG) 25 MG tablet, Take 1 tablet (25 mg total) by mouth 2 (two) times daily., Disp: 180 tablet, Rfl: 3 .  clobetasol (TEMOVATE) 0.05 % external solution, clobetasol 0.05 % scalp solution  APP 1 ML EXT AA D, Disp: , Rfl:  .  finasteride (PROSCAR) 5 MG tablet, Take 1 mg by mouth daily., Disp: , Rfl:  .  furosemide (LASIX) 20 MG tablet, TAKE ONE TABLET BY MOUTH DAILY **PLEASE SCHEDULE APPOINTMENT FOR FURTHER REFILLS [phone]**, Disp: 90 tablet, Rfl: 3 .  levonorgestrel (MIRENA) 20 MCG/24HR IUD, 1 each by Intrauterine route once., Disp: , Rfl:  .  lisinopril (ZESTRIL) 40 MG tablet, TAKE ONE TABLET BY MOUTH DAILY, Disp: 90 tablet, Rfl: 1 .  nitroGLYCERIN (NITROSTAT) 0.4 MG SL tablet, Place 1 tablet (0.4 mg total) under the tongue every 5 (five) minutes as needed for chest pain., Disp: 25 tablet, Rfl: 3 .  spironolactone (ALDACTONE) 50 MG tablet, Take 50 mg by mouth daily., Disp: , Rfl:  .  clonazePAM (KLONOPIN) 0.5 MG tablet, Talk half tab daily as needed, Disp: 30 tablet, Rfl: 0 .  gabapentin (NEURONTIN) 300 MG capsule, Take 1 capsule (300 mg total) by mouth at bedtime., Disp: 90 capsule, Rfl: 1 .  pantoprazole (PROTONIX) 40 MG tablet, Take 1 tablet (40 mg total) by mouth daily., Disp: 90 tablet, Rfl: 1  Review of Systems:  Constitutional: Denies fever, chills, diaphoresis, appetite change and fatigue.  HEENT: Denies photophobia, eye pain, redness, hearing loss, ear pain, congestion, sore throat, rhinorrhea, sneezing, mouth sores, trouble swallowing, neck pain, neck stiffness and tinnitus.   Respiratory:  Denies SOB, DOE, cough, chest tightness,  and wheezing.   Cardiovascular: Denies chest pain, palpitations and leg swelling.  Gastrointestinal: Denies nausea, vomiting, abdominal pain, diarrhea, constipation, blood in stool and abdominal distention.  Genitourinary: Denies dysuria, urgency, frequency, hematuria, flank pain and difficulty urinating.  Endocrine: Denies: hot or cold intolerance, sweats, changes in hair or nails, polyuria, polydipsia. Musculoskeletal: Denies myalgias, back pain and gait problem.  Skin: Denies pallor, rash and wound.  Neurological: Denies dizziness, seizures, syncope, weakness, light-headedness, numbness and headaches.  Hematological: Denies adenopathy. Easy bruising, personal or family bleeding history  Psychiatric/Behavioral: Denies suicidal ideation, mood changes, confusion, nervousness, sleep disturbance and agitation    Physical Exam: Vitals:   07/20/20 0918  BP: 110/80  Pulse: 78  Temp: 98  F (36.7 C)  TempSrc: Oral  SpO2: 98%  Weight: 266 lb 11.2 oz (121 kg)  Height: 5\' 4"  (1.626 m)   Body mass index is 45.78 kg/m.  Constitutional: NAD, calm, comfortable, obese Eyes: PERRL, lids and conjunctivae normal ENMT: Mucous membranes are moist.  Respiratory: clear to auscultation bilaterally, no wheezing, no crackles. Normal respiratory effort. No accessory muscle use.  Cardiovascular: Regular rate and rhythm, no murmurs / rubs / gallops. No extremity edema. .  Neurologic: Grossly intact and nonfocal. Psychiatric: Normal judgment and insight. Alert and oriented x 3. Normal mood.    Impression and Plan:  Gastroesophageal reflux disease without esophagitis  - Plan: pantoprazole (PROTONIX) 40 MG tablet  Mixed hyperlipidemia -Last LDL was 82 in 2018, she is on atorvastatin, followed by cardiology.  Generalized anxiety disorder  -She is on Wellbutrin daily, she also takes Klonopin as needed for anxiety with flying.  She has a plane trip coming up soon  and would like a refill.  Tobacco use disorder -She is currently smoking about 4 to 5 cigarettes a day, her goal is to completely wean, we have not had more time to discuss this issue today.  HTN (hypertension), malignant -Blood pressure is currently well controlled.  Chronic combined systolic and diastolic heart failure ([place]) -Followed by cardiology, Dr. Scarlette Calico. -Her most recent echo showed improvement in ejection fraction to 50 to 55% with impaired relaxation. -She is on lisinopril, carvedilol, atorvastatin, spironolactone, aspirin.  Morbid obesity ([place]) -Discussed healthy lifestyle, including increased physical activity and better food choices to promote weight loss.  Hair loss -Followed by dermatology on finasteride.  Carpal tunnel syndrome of right wrist  - Plan: gabapentin (NEURONTIN) 300 MG capsule    Patient Instructions  -Nice seeing you today!!  -Remember your COVID booster.  -Remember to schedule follow up with GYN including your mammogram.  -Schedule follow up for your physical in 3 months. Please come in fasting that day.     Lelon Frohlich, MD [facility]

## 2020-07-21 ENCOUNTER — Encounter: Payer: Self-pay | Admitting: Internal Medicine

## 2020-07-21 DIAGNOSIS — G5601 Carpal tunnel syndrome, right upper limb: Secondary | ICD-10-CM

## 2020-07-29 NOTE — Progress Notes (Signed)
Subjective:   I, Tammy Hogan, LAT, ATC acting as a scribe for Tammy Leader, MD.  I'm seeing this patient as a consultation for Dr. Domingo Mend. Note will be routed back to referring provider/PCP.  CC: Right carpal tunnel syndrome  HPI: Pt is a 45 y/o female c/o bilat wrist pain due to carpal syndrome that's been ongoing for a couple years, R>L. Pt locates R wrist pain to across carpals and into ulnar side of thumb. Pt c/o pain only in L thumb. Pt c/o pain in R side of neck and shoulder. Pt works in administration and types all day. Pt reports for work she is expected to move heavy file boxes and she is unable due to increased pain. Pt had a nerve conduction study 2019. Pt is R hand dominate.  Numbness/tingling: yes- fingers 1-3 Grip strength: yes- holding onto things for awhile Aggravates: grabbing, driving,  Treatments tried: wrist brace- at night, Gabapentin  Dx imaging: 07/01/17 R hand XR  Pt also c/o R hip pain in Oct when she was getting into the back seat of a car, pt experienced a sharp pain and pop in hip.   Past medical history, Surgical history, Family history, Social history, Allergies, and medications have been entered into the medical record, reviewed.   Review of Systems: No new headache, visual changes, nausea, vomiting, diarrhea, constipation, dizziness, abdominal pain, skin rash, fevers, chills, night sweats, weight loss, swollen lymph nodes, body aches, joint swelling, muscle aches, chest pain, shortness of breath, mood changes, visual or auditory hallucinations.   Objective:    Vitals:   08/01/20 1558  BP: (!) 146/88  Pulse: (!) 105  SpO2: 99%   General: Well Developed, well nourished, and in no acute distress.  Neuro/Psych: Alert and oriented x3, extra-ocular muscles intact, able to move all 4 extremities, sensation grossly intact. Skin: Warm and dry, no rashes noted.  Respiratory: Not using accessory muscles, speaking in full sentences, trachea midline.   Cardiovascular: Pulses palpable, no extremity edema. Abdomen: Does not appear distended. MSK: C-spine normal-appearing Nontender midline. Tender palpation bilateral trapezius. Normal cervical motion. Upper extremity strength is intact throughout.  Right shoulder normal-appearing Nontender. Normal motion. Positive Hawkins and Neer's test. Positive empty can test. Negative Yergason's and speeds test.  Right wrist normal-appearing Normal motion. Positive Tinel's and Phalen's test  Right hip normal-appearing Normal motion. Tender palpation greater trochanter. Intervention managed rotation strength 4/5.  Lab and Radiology Results Nerve Conduction study 07/23/17  Interpretation Summary  EMG & NCV Findings: Evaluation of the right median motor nerve showed reduced amplitude (4.3 mV).  The right median (across palm) sensory nerve showed prolonged distal peak latency (Wrist, 4.3 ms).  All remaining nerves (as indicated in the following tables) were within normal limits.    All examined muscles (as indicated in the following table) showed no evidence of electrical instability.    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   There is no significant electrodiagnostic evidence of any other focal nerve entrapment, brachial plexopathy or cervical radiculopathy.   * As you know, this particular electrodiagnostic study cannot rule out chemical radiculitis or sensory only radiculopathy.   Impression and Recommendations:    Assessment and Plan: 45 y.o. female with  Multifactorial arm and shoulder pain.  Patient has trapezius spasm and dysfunction as well as right shoulder rotator cuff tendinopathy and right carpal tunnel syndrome.  Additionally she has right lateral hip pain due to greater trochanteric bursitis.  The shoulder and hip  pain can both be treated with physical therapy.  The carpal tunnel seems to be reasonably well managed.  Plan to continue gabapentin and wrist brace.  I will write note to limit heavy lifting as this will help her carpal tunnel and her shoulder pain. . Recheck back in 6 weeks.  At that time we will consider injection if needed.  PDMP not reviewed this encounter. Orders Placed This Encounter  Procedures  . Ambulatory referral to Physical Therapy    Referral Priority:   Routine    Referral Type:   Physical Medicine    Referral Reason:   Specialty Services Required    Requested Specialty:   Physical Therapy   No orders of the defined types were placed in this encounter.   Discussed warning signs or symptoms. Please see discharge instructions. Patient expresses understanding.   The above documentation has been reviewed and is accurate and complete Tammy Hogan, M.D.

## 2020-08-01 ENCOUNTER — Ambulatory Visit: Payer: Managed Care, Other (non HMO) | Admitting: Family Medicine

## 2020-08-01 ENCOUNTER — Other Ambulatory Visit: Payer: Self-pay

## 2020-08-01 ENCOUNTER — Ambulatory Visit: Payer: Self-pay

## 2020-08-01 VITALS — BP 146/88 | HR 105 | Ht 64.0 in | Wt 268.4 lb

## 2020-08-01 DIAGNOSIS — M25511 Pain in right shoulder: Secondary | ICD-10-CM

## 2020-08-01 DIAGNOSIS — G5601 Carpal tunnel syndrome, right upper limb: Secondary | ICD-10-CM

## 2020-08-01 DIAGNOSIS — M7061 Trochanteric bursitis, right hip: Secondary | ICD-10-CM | POA: Diagnosis not present

## 2020-08-01 NOTE — Patient Instructions (Addendum)
Thank you for coming in today.  Plan for PT.  Use the night splint at bedtime with gabapentin.   Recheck in 6 weeks.  Let me know if this is not working.   Send FMLA or ADA forms to me.

## 2020-08-16 ENCOUNTER — Other Ambulatory Visit: Payer: Self-pay

## 2020-08-16 ENCOUNTER — Ambulatory Visit: Payer: Managed Care, Other (non HMO) | Attending: Family Medicine

## 2020-08-16 DIAGNOSIS — G8929 Other chronic pain: Secondary | ICD-10-CM | POA: Diagnosis present

## 2020-08-16 DIAGNOSIS — M25551 Pain in right hip: Secondary | ICD-10-CM | POA: Diagnosis present

## 2020-08-16 DIAGNOSIS — M25631 Stiffness of right wrist, not elsewhere classified: Secondary | ICD-10-CM | POA: Insufficient documentation

## 2020-08-16 DIAGNOSIS — M25532 Pain in left wrist: Secondary | ICD-10-CM | POA: Diagnosis present

## 2020-08-16 DIAGNOSIS — M25531 Pain in right wrist: Secondary | ICD-10-CM | POA: Diagnosis present

## 2020-08-16 DIAGNOSIS — M6281 Muscle weakness (generalized): Secondary | ICD-10-CM

## 2020-08-16 DIAGNOSIS — M25632 Stiffness of left wrist, not elsewhere classified: Secondary | ICD-10-CM

## 2020-08-16 DIAGNOSIS — M25511 Pain in right shoulder: Secondary | ICD-10-CM | POA: Diagnosis present

## 2020-08-17 NOTE — Therapy (Addendum)
[place]. [place], [place], [address] Phone: [phone]   Fax:  [phone]  Physical Therapy Evaluation  Patient Details  Name: Tammy Hogan MRN: [id number] Date of Birth: 08/14/1975 Referring Provider (PT): Georgina Snell   Encounter Date: 08/16/2020   PT End of Session - 08/16/20    Visit Number 1    Number of Visits 9    Date for PT Re-Evaluation 10/12/20    Authorization Type [employer]    PT Start Time 1700    PT Stop Time 1745    PT Time Calculation (min) 45 min    Activity Tolerance Patient tolerated treatment well;Patient limited by pain    Behavior During Therapy [facility] for tasks assessed/performed           Past Medical History:  Diagnosis Date  . Anal fissure   . Anemia    past hx   . Anginal pain ([place])   . Anxiety    [place], [place]  . CHF (congestive heart failure) ([place])    systolic heart failure 8299  . Depression   . GERD (gastroesophageal reflux disease)   . History of cardiac cath 07/23/2012   Dr Irish Lack  . History of echocardiogram    Cardiomyopathy-reduced EF 35-40% with wall m otion abnormality concerning for ischemia. Hypokinesis of the anterior and anteroseptal walls to the apex  . HPV (human papilloma virus) infection   . Hyperlipidemia   . Hypertension   . PCOS (polycystic ovarian syndrome)    pt denies  . PPD positive, treated 2008   [place], [place]    Past Surgical History:  Procedure Laterality Date  . CARDIAC CATHETERIZATION  feb, 2014   Dr Irish Lack  . FOOT SURGERY  10/2019  . ORIF ANKLE FRACTURE Left 07/10/2013   Procedure: OPEN REDUCTION INTERNAL FIXATION (ORIF) LEFT BIMALLEOLAR ANKLE FRACTURE;  Surgeon: Mcarthur Rossetti, MD;  Location: [facility];  Service: Orthopedics;  Laterality: Left;    There were no vitals filed for this visit.    Subjective Assessment - 08/16/20 1706    Subjective Referred for R carpal tunnel, R shoulder pain, R hip pain. R wrist - has a  wrist splint, takes gabapentin past 2-3 yrs for pain, hasnt gotten any worse. Reports recently has been getting some of the same symptoms into left wrist as well.  R shoulder pain has never really gone away and has been hurting more often - per pt thinks some RTC involvment. R hip: about 6 months ago getting into sports car felt hip pop and had sharp pain, consistent achy pain with intermittent sharp has not gotten better since. Most pain happens wit prolonged sitting standing squatting lifting    Pertinent History Systolic heart failure 3716, HTN, left ankle fx with ORIF 2015, hx of heart cath    Limitations Sitting;Lifting;Standing    Currently in Pain? Yes    Pain Score --   R wrist: 0/10 current - when felt is numbness/tingling into fingers with wrist and forearm pain. R shoulder: 6/10  achy pain, feels tight, "sometimes clicky" inside with lifting. R hip: 7/10 R lower back into side of hip achy   Pain Relieving Factors rubbing hip, gabapentin and brace at night for carpal tunnel, previously had ms relaxers for shoulder pain with minimal relief                 Med Dx: R carpal tunnel of the wrist. R shoulder pain. R trochanteric  bursitis Referring procider: Georgina Snell Hand dominance Right  No hx of falls other than a few months ago when slipped at home, or fear of falling  Living env: house with husband. 3 steps to get in without HR - gets hip pain. 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 Pain worse with prolonged sititng.  Leisure: work/play with dog.   Overall cog status: WFL  AROM: Cervical ROM WFL with c/o tightness R neck withL SB. R shoulder flexion: WFL and full B but Right is painful at 140 Deg. B wrist extension and flexion 75% with pain (L>R). R hip flexion WFL but painful.  Strength: R hip flexion 3+/5 painful, L hip flex 4-/5. Knee ext: 4+5 B. Knee  flex: 4-/5 R with pain/strain 4/5 L. B shoulder ABD: R 3+/5, L 4/5. B elbow flexion 4+/5. Grip strength: R: 33#  L: 45#  Palpation: RIGHT UT tightness, TTP right lower back into right hip  Gait: Step-through pattern;Decreased arm swing - right;Decreased arm swing - left;Trendelenburg; decreased right pelvic rotation, increeased WS R>L  Stairs: Independent - no rails, alternaitng, some incr r hip pain        Objective measurements completed on examination: See above findings.               PT Education - 08/17/20 0754    Education Details PT POC and initial HEP. Access Code: [email or web address]  URL: [email or web address]  Date: 08/17/2020  Prepared by: Sherlynn Stalls    Exercises  Seated Gentle Upper Trapezius Stretch - 1 x daily - 5 x weekly - 3 sets - 20-30 seconds. hold  Seated Piriformis Stretch - 1 x daily - 5 x weekly - 3 sets - 20-30 seconds hold  Seated March - 1 x daily - 5 x weekly - 2 sets - 10 reps  Shoulder Flexion Wall Slide with Towel - 1 x daily - 5 x weekly - 2 sets - 10 reps  Wrist Prayer Stretch at Table - 1 x daily - 5 x weekly - 4 sets - 20 -30 seconds hold  Seated Median Nerve Glide - 1 x daily - 5 x weekly - 1 sets - 10 reps    Person(s) Educated Patient    Methods Explanation;Demonstration;Handout    Comprehension Verbalized understanding;Returned demonstration;Need further instruction            PT Short Term Goals - 08/16/20 1746      PT SHORT TERM GOAL #1   Title independent with initial HEP    Time 2    Period Weeks    Status New    Target Date 08/30/20             PT Long Term Goals - 08/16/20 1747      PT LONG TERM GOAL #1   Title Independent with advanced HEP    Time 8    Period Weeks    Status New    Target Date 10/11/20      PT LONG TERM GOAL #2   Title She will report 75% decr in pain with using R arm, and with prolonged sitting/standing  at home and work    Time 8    Period Weeks    Status New    Target Date  10/11/20      PT LONG TERM GOAL #3   Title BUE and LE strength grossly  4+/5 with </= 2/10 pain    Time 8    Period Weeks    Status New    Target Date 10/11/20      PT LONG TERM GOAL #4   Title She will be able to lift  and carry 15 pounds with RT arm with 1-2 max pain    Time 8    Period Weeks    Status New    Target Date 10/11/20      PT LONG TERM GOAL #5   Title Symmetrical grip strength with </= 2/10 pain    Time 8    Period Weeks    Status New    Target Date 10/11/20                  Plan - 08/16/20 1800    Clinical Impression Statement Pt is a 45 yo female who presents with Referred for R carpal tunnel, R shoulder pain, R hip pain. R wrist and Reports recently has been getting some of the same symptoms into left wrist as well. She presents with some muscle weakness, decreased flexibility/ROM, Abnormal gait, decreased grip strength.As a reuslt pt has diffiiculty typing for long periods of time at work, staying in prolonged positions, squatting, lifting and carrying things. Pt will benefit from skilld physical therapy to address the aforementioned impairments, work towards decreasing pain and improving function.    Personal Factors and Comorbidities Comorbidity 3+    Examination-Activity Limitations Sit;Squat;Stand;Stairs    Stability/Clinical Decision Making Stable/Uncomplicated    Clinical Decision Making Low    Rehab Potential Good    PT Frequency 1x / week    PT Duration 8 weeks    PT Treatment/Interventions ADLs/Self Care Home Management;Cryotherapy;Electrical Stimulation;Iontophoresis 4mg /ml Dexamethasone;Moist Heat;Gait training;Neuromuscular re-education;Therapeutic exercise;Therapeutic activities;Functional mobility training;Patient/family education;Manual techniques;Energy conservation;Taping;Vasopneumatic Device    PT Next Visit Plan Reassess HEP. Slowly progressflexibility and TE as tolerated. Manual and modalities as needed    Consulted and Agree with Plan  of Care Patient           Patient will benefit from skilled therapeutic intervention in order to improve the following deficits and impairments:  Abnormal gait,Decreased range of motion,Impaired UE functional use,Increased muscle spasms,Pain,Hypomobility,Impaired flexibility,Improper body mechanics,Postural dysfunction,Decreased strength,Decreased mobility  Visit Diagnosis: Muscle weakness (generalized) - Plan: PT plan of care cert/re-cert  Chronic right shoulder pain - Plan: PT plan of care cert/re-cert  Pain in right wrist - Plan: PT plan of care cert/re-cert  Stiffness of right wrist, not elsewhere classified - Plan: PT plan of care cert/re-cert  Pain in left wrist - Plan: PT plan of care cert/re-cert  Stiffness of left wrist, not elsewhere classified - Plan: PT plan of care cert/re-cert  Pain in right hip - Plan: PT plan of care cert/re-cert     Problem List Patient Active Problem List   Diagnosis Date Noted  . Trochanteric bursitis of right hip 08/01/2020  . Pain in joint of right shoulder 06/26/2018  . Decreased right shoulder range of motion 06/26/2018  . 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 Pain of finger of right hand 06/26/2018  . Left knee tendonitis 05/11/2018  . Chemical dermatitis 03/15/2017  . Scalp pain 03/15/2017  . Itching 03/15/2017  . Hair loss 11/07/2016  . Gastroesophageal reflux disease 06/20/2016  . Mixed hyperlipidemia 05/30/2016  . Cervicalgia 05/21/2016  . Generalized anxiety disorder 05/15/2016  . Arthralgia of multiple joints 05/15/2016  . BMI 45.0-49.9, adult ([place]) 05/15/2016  . Essential hypertension  09/07/2014  . Bimalleolar fracture of left ankle 07/10/2013  . Cardiomyopathy ([place]) 07/02/2012  . Systolic heart failure ([place]) 07/02/2012  . HTN (hypertension), malignant 07/01/2012  . Pulmonary edema 07/01/2012  . Back pain, acute 07/01/2012  . Medically noncompliant 07/01/2012  . Tobacco use disorder 07/01/2012     Hall Busing, PT, DPT 08/17/2020, 8:56 AM  [place]. [place], [place], [address] Phone: [phone]   Fax:  [phone]  Name: Tammy Hogan MRN: [id number] Date of Birth: August 01, 1975

## 2020-08-17 NOTE — Patient Instructions (Signed)
Access Code: [email or web address] URL: [email or web address] Date: 08/17/2020 Prepared by: Sherlynn Stalls  Exercises Seated Gentle Upper Trapezius Stretch - 1 x daily - 5 x weekly - 3 sets - 20-30 seconds. hold Seated Piriformis Stretch - 1 x daily - 5 x weekly - 3 sets - 20-30 seconds hold Seated March - 1 x daily - 5 x weekly - 2 sets - 10 reps Shoulder Flexion Wall Slide with Towel - 1 x daily - 5 x weekly - 2 sets - 10 reps Wrist Prayer Stretch at Table - 1 x daily - 5 x weekly - 4 sets - 20 -30 seconds hold Seated Median Nerve Glide - 1 x daily - 5 x weekly - 1 sets - 10 reps

## 2020-08-24 ENCOUNTER — Encounter: Payer: Self-pay | Admitting: Physical Therapy

## 2020-08-24 ENCOUNTER — Other Ambulatory Visit: Payer: Self-pay

## 2020-08-24 ENCOUNTER — Ambulatory Visit: Payer: Managed Care, Other (non HMO) | Admitting: Physical Therapy

## 2020-08-24 DIAGNOSIS — M6281 Muscle weakness (generalized): Secondary | ICD-10-CM

## 2020-08-24 DIAGNOSIS — G8929 Other chronic pain: Secondary | ICD-10-CM

## 2020-08-24 DIAGNOSIS — M25632 Stiffness of left wrist, not elsewhere classified: Secondary | ICD-10-CM

## 2020-08-24 DIAGNOSIS — M25551 Pain in right hip: Secondary | ICD-10-CM

## 2020-08-24 DIAGNOSIS — M25631 Stiffness of right wrist, not elsewhere classified: Secondary | ICD-10-CM

## 2020-08-24 DIAGNOSIS — M25531 Pain in right wrist: Secondary | ICD-10-CM

## 2020-08-24 DIAGNOSIS — M25511 Pain in right shoulder: Secondary | ICD-10-CM

## 2020-08-24 DIAGNOSIS — M25532 Pain in left wrist: Secondary | ICD-10-CM

## 2020-08-24 NOTE — Therapy (Signed)
[place]. [place], [place], [address] Phone: [phone]   Fax:  [phone]  Physical Therapy Treatment  Patient Details  Name: Tammy Hogan MRN: [id number] Date of Birth: 1976-02-01 Referring Provider (PT): Marikay Alar Date: 08/24/2020   PT End of Session - 08/24/20 1740    Visit Number 2    Number of Visits 9    Date for PT Re-Evaluation 10/12/20    Authorization Type Cign[employer]    PT Start Time 1655    PT Stop Time 1750    PT Time Calculation (min) 55 min    Activity Tolerance Patient tolerated treatment well;Patient limited by pain    Behavior During Therapy [facility] for tasks assessed/performed           Past Medical History:  Diagnosis Date  . Anal fissure   . Anemia    past hx   . Anginal pain ([place])   . Anxiety    [place], [place]  . CHF (congestive heart failure) ([place])    systolic heart failure 4917  . Depression   . GERD (gastroesophageal reflux disease)   . History of cardiac cath 07/23/2012   Dr Irish Lack  . History of echocardiogram    Cardiomyopathy-reduced EF 35-40% with wall m otion abnormality concerning for ischemia. Hypokinesis of the anterior and anteroseptal walls to the apex  . HPV (human papilloma virus) infection   . Hyperlipidemia   . Hypertension   . PCOS (polycystic ovarian syndrome)    pt denies  . PPD positive, treated 2008   [place], [place]    Past Surgical History:  Procedure Laterality Date  . CARDIAC CATHETERIZATION  feb, 2014   Dr Irish Lack  . FOOT SURGERY  10/2019  . ORIF ANKLE FRACTURE Left 07/10/2013   Procedure: OPEN REDUCTION INTERNAL FIXATION (ORIF) LEFT BIMALLEOLAR ANKLE FRACTURE;  Surgeon: Mcarthur Rossetti, MD;  Location: [facility];  Service: Orthopedics;  Laterality: Left;    There were no vitals filed for this visit.   Subjective Assessment - 08/24/20 1707    Subjective Patient reports that the hip stretch helps some.  Reports pain i nthe right  hip and the shoulder a 5/10    Currently in Pain? Yes    Pain Score 5     Pain Location Shoulder   right shoulder and right hip   Pain Orientation Right    Pain Descriptors / Indicators Aching    Pain Radiating Towards right elbow    Pain Onset More than a month ago    Pain Frequency Constant    Aggravating Factors  use of the shoulder and arm, activity pain up to 8/10    Effect of Pain on Daily Activities reports "just always hurts                             [facility] Adult PT Treatment/Exercise - 08/24/20 0001      Exercises   Exercises Shoulder      Shoulder Exercises: Supine   Other Supine Exercises passive HS, pirifomris and ITB stretches of the LE's      Shoulder Exercises: Seated   Extension Both;20 reps;Theraband    Row Both;20 reps;Theraband    Theraband Level (Shoulder Row) Level 2 (Red)    External Rotation Both;20 reps;Theraband    Theraband Level (Shoulder External Rotation) Level 1 (Yellow)      Shoulder Exercises: Standing  Other Standing Exercises 3# shrugs with upper trap and levator stretches      Shoulder Exercises: ROM/Strengthening   Nustep level 4 x 5 minutes      Modalities   Modalities Moist Heat;Electrical Stimulation      Moist Heat Therapy   Number Minutes Moist Heat 10 Minutes    Moist Heat Location Shoulder      Electrical Stimulation   Electrical Stimulation Location right upper trap and shoulder    Electrical Stimulation Action IFC    Electrical Stimulation Parameters sitting    Electrical Stimulation Goals Pain                    PT Short Term Goals - 08/24/20 1744      PT SHORT TERM GOAL #1   Title independent with initial HEP    Status Partially Met      PT SHORT TERM GOAL #2   Title She will report 10-20% inmprovement in pain with use of RT arm    Status On-going             PT Long Term Goals - 08/16/20 1747      PT LONG TERM GOAL #1   Title Independent with advanced HEP    Time 8     Period Weeks    Status New    Target Date 10/11/20      PT LONG TERM GOAL #2   Title She will report 75% decr in pain with using R arm, and with prolonged sitting/standing  at home and work    Time 8    Period Weeks    Status New    Target Date 10/11/20      PT LONG TERM GOAL #3   Title BUE and LE strength grossly 4+/5 with </= 2/10 pain    Time 8    Period Weeks    Status New    Target Date 10/11/20      PT LONG TERM GOAL #4   Title She will be able to lift  and carry 15 pounds with RT arm with 1-2 max pain    Time 8    Period Weeks    Status New    Target Date 10/11/20      PT LONG TERM GOAL #5   Title Symmetrical grip strength with </= 2/10 pain    Time 8    Period Weeks    Status New    Target Date 10/11/20                 Plan - 08/24/20 1741    Clinical Impression Statement I reviewed the HEP she had a few questions but was able to recall the exercises, I added some scapular stabilization, :LE stretches and modalities  She seemed to like the stretches, saying they "hurt but feels good"    PT Next Visit Plan try to progress exercises, see if she thought the modalities helped, could try ionto and or dry needles    Consulted and Agree with Plan of Care Patient           Patient will benefit from skilled therapeutic intervention in order to improve the following deficits and impairments:  Abnormal gait,Decreased range of motion,Impaired UE functional use,Increased muscle spasms,Pain,Hypomobility,Impaired flexibility,Improper body mechanics,Postural dysfunction,Decreased strength,Decreased mobility  Visit Diagnosis: Muscle weakness (generalized)  Chronic right shoulder pain  Pain in right wrist  Stiffness of right wrist, not elsewhere classified  Pain in left wrist  Stiffness of left wrist, not elsewhere classified  Pain in right hip     Problem List Patient Active Problem List   Diagnosis Date Noted  . Trochanteric bursitis of right hip  08/01/2020  . Pain in joint of right shoulder 06/26/2018  . Decreased right shoulder range of motion 06/26/2018  . 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 Pain of finger of right hand 06/26/2018  . Left knee tendonitis 05/11/2018  . Chemical dermatitis 03/15/2017  . Scalp pain 03/15/2017  . Itching 03/15/2017  . Hair loss 11/07/2016  . Gastroesophageal reflux disease 06/20/2016  . Mixed hyperlipidemia 05/30/2016  . Cervicalgia 05/21/2016  . Generalized anxiety disorder 05/15/2016  . Arthralgia of multiple joints 05/15/2016  . BMI 45.0-49.9, adult (HCC) 05/15/2016  . Essential hypertension 09/07/2014  . Bimalleolar fracture of left ankle 07/10/2013  . Cardiomyopathy (HCC) 07/02/2012  . Systolic heart failure (HCC) 07/02/2012  . HTN (hypertension), malignant 07/01/2012  . Pulmonary edema 07/01/2012  . Back pain, acute 07/01/2012  . Medically noncompliant 07/01/2012  . Tobacco use disorder 07/01/2012    ALBRIGHT,MICHAEL W., PT 08/24/2020, 5:45 PM  [facility] [address] [place], [place], [address] Phone: [phone]   Fax:  [phone]  Name: Siana Calender MRN: [id number] Date of Birth: 04/08/1976   

## 2020-09-01 ENCOUNTER — Other Ambulatory Visit: Payer: Self-pay

## 2020-09-01 ENCOUNTER — Ambulatory Visit: Payer: Managed Care, Other (non HMO) | Admitting: Physical Therapy

## 2020-09-01 ENCOUNTER — Encounter: Payer: Self-pay | Admitting: Physical Therapy

## 2020-09-01 DIAGNOSIS — M25551 Pain in right hip: Secondary | ICD-10-CM

## 2020-09-01 DIAGNOSIS — M25511 Pain in right shoulder: Secondary | ICD-10-CM

## 2020-09-01 DIAGNOSIS — M25532 Pain in left wrist: Secondary | ICD-10-CM

## 2020-09-01 DIAGNOSIS — M25631 Stiffness of right wrist, not elsewhere classified: Secondary | ICD-10-CM

## 2020-09-01 DIAGNOSIS — M25531 Pain in right wrist: Secondary | ICD-10-CM

## 2020-09-01 DIAGNOSIS — G8929 Other chronic pain: Secondary | ICD-10-CM

## 2020-09-01 DIAGNOSIS — M25632 Stiffness of left wrist, not elsewhere classified: Secondary | ICD-10-CM

## 2020-09-01 DIAGNOSIS — M6281 Muscle weakness (generalized): Secondary | ICD-10-CM

## 2020-09-01 NOTE — Therapy (Signed)
[place]. [place], [place], [address] Phone: [phone]   Fax:  [phone]  Physical Therapy Treatment  Patient Details  Name: Tammy Hogan MRN: [id number] Date of Birth: 1976-02-14 Referring Provider (PT): Marikay Alar Date: 09/01/2020   PT End of Session - 09/01/20 1757    Visit Number 3    Number of Visits 9    Date for PT Re-Evaluation 10/12/20    Authorization Type Cigna    PT Start Time 9417    PT Stop Time 1751    PT Time Calculation (min) 58 min    Activity Tolerance Patient tolerated treatment well    Behavior During Therapy [facility] for tasks assessed/performed           Past Medical History:  Diagnosis Date  . Anal fissure   . Anemia    past hx   . Anginal pain ([place])   . Anxiety    [place], [place]  . CHF (congestive heart failure) ([place])    systolic heart failure 4081  . Depression   . GERD (gastroesophageal reflux disease)   . History of cardiac cath 07/23/2012   Dr Irish Lack  . History of echocardiogram    Cardiomyopathy-reduced EF 35-40% with wall m otion abnormality concerning for ischemia. Hypokinesis of the anterior and anteroseptal walls to the apex  . HPV (human papilloma virus) infection   . Hyperlipidemia   . Hypertension   . PCOS (polycystic ovarian syndrome)    pt denies  . PPD positive, treated 2008   [place], [place]    Past Surgical History:  Procedure Laterality Date  . CARDIAC CATHETERIZATION  feb, 2014   Dr Irish Lack  . FOOT SURGERY  10/2019  . ORIF ANKLE FRACTURE Left 07/10/2013   Procedure: OPEN REDUCTION INTERNAL FIXATION (ORIF) LEFT BIMALLEOLAR ANKLE FRACTURE;  Surgeon: Mcarthur Rossetti, MD;  Location[facility];  Service: Orthopedics;  Laterality: Left;    There were no vitals filed for this visit.   Subjective Assessment - 09/01/20 1702    Subjective I think the last time really helped, better motions and less pain    Currently in Pain? Yes     Pain Score 2     Pain Location Shoulder   right hip   Pain Orientation Right    Pain Descriptors / Indicators Aching    Aggravating Factors  carrying thinks, squatting    Pain Relieving Factors that last treatment helped                             [facility] Adult PT Treatment/Exercise - 09/01/20 0001      Shoulder Exercises: Supine   Other Supine Exercises passive HS, pirifomris and ITB stretches of the LE's    Other Supine Exercises feet on ball K2C, trunk rotation, small bridge and isometric      Shoulder Exercises: Seated   Extension Both;20 reps;Theraband    Row Both;20 reps;Theraband    Theraband Level (Shoulder Row) Level 2 (Red)    External Rotation Both;20 reps;Theraband    Theraband Level (Shoulder External Rotation) Level 1 (Yellow)      Shoulder Exercises: Sidelying   Other Sidelying Exercises right hip clams      Shoulder Exercises: Standing   Other Standing Exercises 3# shrugs with upper trap and levator stretches    Other Standing Exercises W backs, weighted ball overhead press with back to  wall      Shoulder Exercises: ROM/Strengthening   Cybex Press 1 plate;20 reps    Cybex Press Limitations leg press      Modalities   Modalities Moist Heat;Electrical Stimulation      Moist Heat Therapy   Number Minutes Moist Heat 10 Minutes    Moist Heat Location Shoulder      Electrical Stimulation   Electrical Stimulation Location right upper trap and shoulder    Electrical Stimulation Action IFC    Electrical Stimulation Parameters sitting    Electrical Stimulation Goals Pain                    PT Short Term Goals - 09/01/20 1759      PT SHORT TERM GOAL #1   Title independent with initial HEP    Status Achieved      PT SHORT TERM GOAL #2   Title She will report 10-20% inmprovement in pain with use of RT arm    Status Achieved             PT Long Term Goals - 08/16/20 1747      PT LONG TERM GOAL #1   Title Independent with  advanced HEP    Time 8    Period Weeks    Status New    Target Date 10/11/20      PT LONG TERM GOAL #2   Title She will report 75% decr in pain with using R arm, and with prolonged sitting/standing  at home and work    Time 8    Period Weeks    Status New    Target Date 10/11/20      PT LONG TERM GOAL #3   Title BUE and LE strength grossly 4+/5 with </= 2/10 pain    Time 8    Period Weeks    Status New    Target Date 10/11/20      PT LONG TERM GOAL #4   Title She will be able to lift  and carry 15 pounds with RT arm with 1-2 max pain    Time 8    Period Weeks    Status New    Target Date 10/11/20      PT LONG TERM GOAL #5   Title Symmetrical grip strength with </= 2/10 pain    Time 8    Period Weeks    Status New    Target Date 10/11/20                 Plan - 09/01/20 1758    Clinical Impression Statement I added to her exercises today to include more posture and scapular stabilization, did the leg press and clams.  She had no c/o increase pain with this, we added supine ball exercises for core and she required cues for core activation and posture.    PT Next Visit Plan try to progress exercises, see if she thought the modalities helped, could try ionto and or dry needles    Consulted and Agree with Plan of Care Patient           Patient will benefit from skilled therapeutic intervention in order to improve the following deficits and impairments:  Abnormal gait,Decreased range of motion,Impaired UE functional use,Increased muscle spasms,Pain,Hypomobility,Impaired flexibility,Improper body mechanics,Postural dysfunction,Decreased strength,Decreased mobility  Visit Diagnosis: Muscle weakness (generalized)  Chronic right shoulder pain  Pain in right wrist  Stiffness of right wrist, not elsewhere classified  Pain in  left wrist  Stiffness of left wrist, not elsewhere classified  Pain in right hip     Problem List Patient Active Problem List    Diagnosis Date Noted  . Trochanteric bursitis of right hip 08/01/2020  . Pain in joint of right shoulder 06/26/2018  . Decreased right shoulder range of motion 06/26/2018  . 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 Pain of finger of right hand 06/26/2018  . Left knee tendonitis 05/11/2018  . Chemical dermatitis 03/15/2017  . Scalp pain 03/15/2017  . Itching 03/15/2017  . Hair loss 11/07/2016  . Gastroesophageal reflux disease 06/20/2016  . Mixed hyperlipidemia 05/30/2016  . Cervicalgia 05/21/2016  . Generalized anxiety disorder 05/15/2016  . Arthralgia of multiple joints 05/15/2016  . BMI 45.0-49.9, adult ([place]) 05/15/2016  . Essential hypertension 09/07/2014  . Bimalleolar fracture of left ankle 07/10/2013  . Cardiomyopathy ([place]) 07/02/2012  . Systolic heart failure ([place]) 07/02/2012  . HTN (hypertension), malignant 07/01/2012  . Pulmonary edema 07/01/2012  . Back pain, acute 07/01/2012  . Medically noncompliant 07/01/2012  . Tobacco use disorder 07/01/2012    Sumner Boast., PT 09/01/2020, 6:00 PM  [place]. [place], [place], [address] Phone: [phone]   Fax:  [phone]  Name: Tammy Hogan MRN: [id number] Date of Birth: January 06, 1976

## 2020-09-05 ENCOUNTER — Ambulatory Visit: Payer: Managed Care, Other (non HMO) | Admitting: Physical Therapy

## 2020-09-15 ENCOUNTER — Other Ambulatory Visit: Payer: Self-pay

## 2020-09-15 ENCOUNTER — Ambulatory Visit: Payer: Managed Care, Other (non HMO) | Attending: Family Medicine | Admitting: Physical Therapy

## 2020-09-15 ENCOUNTER — Encounter: Payer: Self-pay | Admitting: Physical Therapy

## 2020-09-15 DIAGNOSIS — M25532 Pain in left wrist: Secondary | ICD-10-CM | POA: Diagnosis present

## 2020-09-15 DIAGNOSIS — M25631 Stiffness of right wrist, not elsewhere classified: Secondary | ICD-10-CM

## 2020-09-15 DIAGNOSIS — M6281 Muscle weakness (generalized): Secondary | ICD-10-CM | POA: Diagnosis present

## 2020-09-15 DIAGNOSIS — G8929 Other chronic pain: Secondary | ICD-10-CM | POA: Diagnosis present

## 2020-09-15 DIAGNOSIS — M25511 Pain in right shoulder: Secondary | ICD-10-CM | POA: Insufficient documentation

## 2020-09-15 DIAGNOSIS — M25551 Pain in right hip: Secondary | ICD-10-CM | POA: Diagnosis present

## 2020-09-15 DIAGNOSIS — M25531 Pain in right wrist: Secondary | ICD-10-CM

## 2020-09-15 DIAGNOSIS — M25632 Stiffness of left wrist, not elsewhere classified: Secondary | ICD-10-CM | POA: Diagnosis present

## 2020-09-15 NOTE — Therapy (Signed)
[place]. [place], [place], [address] Phone: [phone]   Fax:  [phone]  Physical Therapy Treatment  Patient Details  Name: Tammy Hogan MRN: [id number] Date of Birth: [birth year] Referring Provider (PT): Marikay Alar Date: 09/15/2020   PT End of Session - 09/15/20 1755    Visit Number 4    Number of Visits 9    Date for PT Re-Evaluation 10/12/20    Authorization Type Cigna    PT Start Time 1657    PT Stop Time 1750    PT Time Calculation (min) 53 min    Activity Tolerance Patient tolerated treatment well           Past Medical History:  Diagnosis Date  . Anal fissure   . Anemia    past hx   . Anginal pain ([place])   . Anxiety    [place], [place]  . CHF (congestive heart failure) ([place])    systolic heart failure 5885  . Depression   . GERD (gastroesophageal reflux disease)   . History of cardiac cath 07/23/2012   Dr Irish Lack  . History of echocardiogram    Cardiomyopathy-reduced EF 35-40% with wall m otion abnormality concerning for ischemia. Hypokinesis of the anterior and anteroseptal walls to the apex  . HPV (human papilloma virus) infection   . Hyperlipidemia   . Hypertension   . PCOS (polycystic ovarian syndrome)    pt denies  . PPD positive, treated 2008   [place], [place]    Past Surgical History:  Procedure Laterality Date  . CARDIAC CATHETERIZATION  feb, 2014   Dr Irish Lack  . FOOT SURGERY  10/2019  . ORIF ANKLE FRACTURE Left 07/10/2013   Procedure: OPEN REDUCTION INTERNAL FIXATION (ORIF) LEFT BIMALLEOLAR ANKLE FRACTURE;  Surgeon: Mcarthur Rossetti, MD;  Location[facility];  Service: Orthopedics;  Laterality: Left;    There were no vitals filed for this visit.   Subjective Assessment - 09/15/20 1706    Subjective Patient reports feeling better, much less pain in the hip and the shoulder    Pertinent History Systolic heart failure 0277, HTN, left ankle fx with ORIF 2015,  hx of heart cath    Currently in Pain? Yes    Pain Score 2     Pain Location Shoulder   hip   Pain Relieving Factors stretches, massage and exercise help                             [facility] Adult PT Treatment/Exercise - 09/15/20 0001      Shoulder Exercises: Supine   Other Supine Exercises passive HS, pirifomris and ITB stretches of the LE's    Other Supine Exercises feet on ball K2C, trunk rotation, small bridge and isometric      Shoulder Exercises: Standing   Other Standing Exercises 3# shrugs with upper trap and levator stretches    Other Standing Exercises W backs, weighted ball overhead press with back to wall      Shoulder Exercises: ROM/Strengthening   Nustep level 5 x 6 minutes    Cybex Press 1 plate;20 reps    Cybex Press Limitations leg press      Shoulder Exercises: Stretch   Other Shoulder Stretches LE and UE stretches passively      Moist Heat Therapy   Number Minutes Moist Heat 10 Minutes    Moist Heat Location Shoulder  Acupuncturist Location right upper trap and shoulder    Electrical Stimulation Action IFC    Electrical Stimulation Parameters sitting    Electrical Stimulation Goals Pain                    PT Short Term Goals - 09/01/20 1759      PT SHORT TERM GOAL #1   Title independent with initial HEP    Status Achieved      PT SHORT TERM GOAL #2   Title She will report 10-20% inmprovement in pain with use of RT arm    Status Achieved             PT Long Term Goals - 09/15/20 1758      PT LONG TERM GOAL #1   Title Independent with advanced HEP    Status Partially Met      PT LONG TERM GOAL #2   Title She will report 75% decr in pain with using R arm, and with prolonged sitting/standing  at home and work    Status Partially Met      PT LONG TERM GOAL #3   Title BUE and LE strength grossly 4+/5 with </= 2/10 pain    Status Partially Met      PT LONG TERM GOAL #4    Title She will be able to lift  and carry 15 pounds with RT arm with 1-2 max pain    Status Partially Met      PT LONG TERM GOAL #5   Title Symmetrical grip strength with </= 2/10 pain    Status Partially Met                 Plan - 09/15/20 1756    Clinical Impression Statement Gave her calf stretches to add to her HEP, also went over the ball exercises that could be done at home.  She is doing much better overall with less pain and reports of easier movements and ADL's.  She does have some tightness in the LE's, she gets good core activation with verbal and tactile cues.    PT Next Visit Plan will see a few more times to maximize her function, strength, ROM and pain, we have not tried ionto or dry needles as she is doing very well.  If her pain returns we could try these    Consulted and Agree with Plan of Care Patient           Patient will benefit from skilled therapeutic intervention in order to improve the following deficits and impairments:  Abnormal gait,Decreased range of motion,Impaired UE functional use,Increased muscle spasms,Pain,Hypomobility,Impaired flexibility,Improper body mechanics,Postural dysfunction,Decreased strength,Decreased mobility  Visit Diagnosis: Muscle weakness (generalized)  Chronic right shoulder pain  Pain in right wrist  Stiffness of right wrist, not elsewhere classified  Pain in left wrist  Stiffness of left wrist, not elsewhere classified  Pain in right hip     Problem List Patient Active Problem List   Diagnosis Date Noted  . Trochanteric bursitis of right hip 08/01/2020  . Pain in joint of right shoulder 06/26/2018  . Decreased right shoulder range of motion 06/26/2018  . 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 Pain of finger of right hand 06/26/2018  . Left knee tendonitis 05/11/2018  . Chemical dermatitis 03/15/2017  . Scalp pain 03/15/2017  . Itching 03/15/2017  . Hair loss 11/07/2016  . Gastroesophageal  reflux disease 06/20/2016  .  Mixed hyperlipidemia 05/30/2016  . Cervicalgia 05/21/2016  . Generalized anxiety disorder 05/15/2016  . Arthralgia of multiple joints 05/15/2016  . BMI 45.0-49.9, adult ([place]) 05/15/2016  . Essential hypertension 09/07/2014  . Bimalleolar fracture of left ankle 07/10/2013  . Cardiomyopathy ([place]) 07/02/2012  . Systolic heart failure ([place]) 07/02/2012  . HTN (hypertension), malignant 07/01/2012  . Pulmonary edema 07/01/2012  . Back pain, acute 07/01/2012  . Medically noncompliant 07/01/2012  . Tobacco use disorder 07/01/2012    Sumner Boast., PT 09/15/2020, 5:58 PM  [place]. [place], [place], [address] Phone: [phone]   Fax:  [phone]  Name: Tammy Hogan MRN: [id number] Date of Birth: 08/18/1975

## 2020-09-19 ENCOUNTER — Other Ambulatory Visit: Payer: Self-pay

## 2020-09-19 ENCOUNTER — Encounter: Payer: Self-pay | Admitting: Family Medicine

## 2020-09-19 ENCOUNTER — Ambulatory Visit: Payer: Managed Care, Other (non HMO) | Admitting: Family Medicine

## 2020-09-19 VITALS — BP 150/90 | HR 107 | Ht 64.0 in | Wt 270.0 lb

## 2020-09-19 DIAGNOSIS — G5601 Carpal tunnel syndrome, right upper limb: Secondary | ICD-10-CM

## 2020-09-19 DIAGNOSIS — M7061 Trochanteric bursitis, right hip: Secondary | ICD-10-CM | POA: Diagnosis not present

## 2020-09-19 DIAGNOSIS — M25511 Pain in right shoulder: Secondary | ICD-10-CM

## 2020-09-19 NOTE — Patient Instructions (Addendum)
Thank you for coming in today.  Continue PT and home exercises.   Recheck with me as needed.   I am here for you.   Tammy Hogan

## 2020-09-19 NOTE — Progress Notes (Signed)
   I, Kandace Blitz , LAT, ATC acting as a scribe for Lynne Leader, MD.  Tammy Hogan is a 45 y.o. female who presents to [place] at [facility] today for f/u R carpal tunnel syndrome, R hip pain, and R shoulder/arm pain. Pt was last seen by Dr. Georgina Snell on 08/01/20 and was referred to PT of which she's completed 4 visits. Pt was also advised to continue gabapentin and wearing wrist splints. Today, pt reports she is doing well today. Believes she is making progress.  Dx imaging: 10/14/19 R elbow XR  04/06/19 R elbow XR  06/26/18 C-spine & R shoulder XR    Pertinent review of systems: No fevers or chills  Relevant historical information: History of heart failure   Exam:  BP (!) 150/90 (BP Location: Left Arm, Patient Position: Sitting)   Pulse (!) 107   Ht 5\' 4"  (1.626 m)   Wt 270 lb (122.5 kg)   SpO2 99%   BMI 46.35 kg/m  General: Well Developed, well nourished, and in no acute distress.   MSK: Right trapezius normal-appearing nontender.  Right greater trochanter normal-appearing nontender normal hip motion.  Hands bilaterally no significant atrophy.     Assessment and Plan: 45 y.o. female with right trapezius pain and right greater trochanteric bursitis difficulty improved with physical therapy.  Patient still has some pain left and is still proceeding with physical therapy.  Recommend proceeding with PT and will proceed with watchful waiting on my end.  Recheck back with me as needed for these issues.  Patient continues to have a mild carpal tunnel syndrome that seems to be pretty well managed with night splints.  Watchful waiting and intermittent gabapentin.  Consider injection if needed.  PDMP not reviewed this encounter. No orders of the defined types were placed in this encounter.  No orders of the defined types were placed in this encounter.    Discussed warning signs or symptoms. Please see discharge instructions. Patient expresses  understanding.   The above documentation has been reviewed and is accurate and complete Lynne Leader, M.D.

## 2020-09-22 ENCOUNTER — Ambulatory Visit: Payer: Managed Care, Other (non HMO)

## 2020-09-28 ENCOUNTER — Ambulatory Visit: Payer: Managed Care, Other (non HMO)

## 2020-10-05 ENCOUNTER — Ambulatory Visit: Payer: Managed Care, Other (non HMO)

## 2020-10-12 ENCOUNTER — Ambulatory Visit: Payer: Managed Care, Other (non HMO)

## 2020-11-26 ENCOUNTER — Other Ambulatory Visit: Payer: Self-pay | Admitting: Interventional Cardiology

## 2020-11-26 DIAGNOSIS — I10 Essential (primary) hypertension: Secondary | ICD-10-CM

## 2020-12-26 ENCOUNTER — Other Ambulatory Visit: Payer: Self-pay | Admitting: Internal Medicine

## 2020-12-26 DIAGNOSIS — K219 Gastro-esophageal reflux disease without esophagitis: Secondary | ICD-10-CM

## 2021-01-09 ENCOUNTER — Other Ambulatory Visit: Payer: Self-pay | Admitting: Interventional Cardiology

## 2021-01-12 ENCOUNTER — Other Ambulatory Visit: Payer: Self-pay

## 2021-01-13 ENCOUNTER — Ambulatory Visit: Payer: BC Managed Care – PPO | Admitting: Internal Medicine

## 2021-01-13 ENCOUNTER — Ambulatory Visit (HOSPITAL_BASED_OUTPATIENT_CLINIC_OR_DEPARTMENT_OTHER)
Admission: RE | Admit: 2021-01-13 | Discharge: 2021-01-13 | Disposition: A | Discharge status: Home / Self Care | Payer: BC Managed Care – PPO | Source: Ambulatory Visit | Attending: Internal Medicine | Admitting: Internal Medicine

## 2021-01-13 ENCOUNTER — Encounter: Payer: Self-pay | Admitting: Internal Medicine

## 2021-01-13 VITALS — BP 140/90 | HR 103 | Temp 98.2°F | Wt 267.9 lb

## 2021-01-13 DIAGNOSIS — M549 Dorsalgia, unspecified: Secondary | ICD-10-CM

## 2021-01-13 DIAGNOSIS — R079 Chest pain, unspecified: Secondary | ICD-10-CM | POA: Insufficient documentation

## 2021-01-13 DIAGNOSIS — F17210 Nicotine dependence, cigarettes, uncomplicated: Secondary | ICD-10-CM

## 2021-01-13 MED ORDER — BUPROPION HCL ER (XL) 300 MG PO TB24: 300.0000 mg | ORAL_TABLET | Freq: Every day | ORAL | 1 refills | Status: DC

## 2021-01-13 NOTE — Patient Instructions (Signed)
-  Nice seeing you today!!  -Start wellbutrin 300 mg daily. Make sure you schedule counseling sessions.  -Make sure you arrange follow up with your cardiologist.

## 2021-01-13 NOTE — Progress Notes (Addendum)
Established Patient Office Visit     This visit occurred during the SARS-CoV-2 public health emergency.  Safety protocols were in place, including screening questions prior to the visit, additional usage of staff PPE, and extensive cleaning of exam room while observing appropriate contact time as indicated for disinfecting solutions.    CC/Reason for Visit: Discuss smoking cessation, weight loss and chest/back pain  HPI: Tammy Hogan is a 45 y.o. female who is coming in today for the above mentioned reasons. Past Medical History is significant for: Morbid obesity, hypertension, GERD, combined congestive heart failure followed by Dr. Scarlette Calico, alopecia followed by dermatology with plans for hair transplantation, carpal tunnel syndrome, ongoing nicotine dependence, anxiety/depression.  She is here for some acute issues today:  1.  She would like to discuss smoking cessation.  She tried Chantix in the past unsuccessfully.  She is currently smoking about half a pack a day for about 20 years.  She used to be on Wellbutrin for her mental health, she quit taking it about 6 months ago and has noticed an increase in smoking since then.  2.  She would like to discuss weight loss.  3.  Towards the end of her visit she mentions that 3 days ago she had acute onset of severe substernal chest pain that she describes as sharp in nature and radiated like a knife into her back.  She did not have nitroglycerin on hand so she did not take it.  She was sitting at work when the chest pain began.  It lasted for about 10 to 15 minutes.  It was improved after burping and taking some Tums.  She again had a recurrence of this pain 2 days ago.  She was sitting and again Tums made it better.  She feels like her upper back is still painful.  She has not seen her cardiologist in some time.   Past Medical/Surgical History: Past Medical History:  Diagnosis Date   Anal fissure    Anemia    past hx     Anginal pain ([place])    Anxiety    [place], [place]   CHF (congestive heart failure) (HCC)    systolic heart failure 123456   Depression    GERD (gastroesophageal reflux disease)    History of cardiac cath 07/23/2012   Dr Irish Lack   History of echocardiogram    Cardiomyopathy-reduced EF 35-40% with wall m otion abnormality concerning for ischemia. Hypokinesis of the anterior and anteroseptal walls to the apex   HPV (human papilloma virus) infection    Hyperlipidemia    Hypertension    PCOS (polycystic ovarian syndrome)    pt denies   PPD positive, treated 2008   [place], [place]    Past Surgical History:  Procedure Laterality Date   CARDIAC CATHETERIZATION  feb, 2014   Dr Irish Lack   FOOT SURGERY  10/2019   ORIF ANKLE FRACTURE Left 07/10/2013   Procedure: OPEN REDUCTION INTERNAL FIXATION (ORIF) LEFT BIMALLEOLAR ANKLE FRACTURE;  Surgeon: Mcarthur Rossetti, MD;  Location: [facility];  Service: Orthopedics;  Laterality: Left;    Social History:  reports that she has been smoking cigarettes. She has a 3.20 pack-year smoking history. She has never used smokeless tobacco. She reports previous alcohol use. She reports that she does not use drugs.  Allergies: Allergies  Allergen Reactions   Percocet [Oxycodone-Acetaminophen] Nausea Only    Family History:  Family History  Problem Relation Age of Onset  Hypertension Mother    Hyperlipidemia Mother    Colon polyps Mother    Heart failure Paternal Grandmother    Hypertension Paternal Grandmother    Diabetes Paternal Grandmother    Glaucoma Paternal Grandmother    Other Father        drug overdose   Drug abuse Father        overdose   Hypertension Sister    Cancer Maternal Grandmother    Cirrhosis Paternal Grandfather    Alcohol abuse Paternal Grandfather    Cancer Other 69       breast, cervical and colon cancer   Colon cancer Neg Hx    Esophageal cancer Neg Hx    Rectal cancer Neg Hx    Stomach cancer Neg Hx      Current  Outpatient Medications:    AMBULATORY NON FORMULARY MEDICATION, Medication Name: Diltiazem ointment 2%- apply to anal sphincter 5 times a day as instructed per Dr. Henrene Pastor, Disp: 30 g, Rfl: 3   amLODipine (NORVASC) 10 MG tablet, Take 1 tablet (10 mg total) by mouth daily., Disp: 90 tablet, Rfl: 3   aspirin 81 MG tablet, Take 81 mg by mouth daily., Disp: , Rfl:    atorvastatin (LIPITOR) 10 MG tablet, TAKE ONE TABLET BY MOUTH DAILY, Disp: 60 tablet, Rfl: 3   carvedilol (COREG) 25 MG tablet, TAKE ONE TABLET BY MOUTH TWICE A DAY, Disp: 180 tablet, Rfl: 3   clobetasol (TEMOVATE) 0.05 % external solution, clobetasol 0.05 % scalp solution  APP 1 ML EXT AA D, Disp: , Rfl:    clonazePAM (KLONOPIN) 0.5 MG tablet, Talk half tab daily as needed, Disp: 30 tablet, Rfl: 0   finasteride (PROSCAR) 5 MG tablet, Take 1 mg by mouth daily., Disp: , Rfl:    furosemide (LASIX) 20 MG tablet, TAKE ONE TABLET BY MOUTH DAILY, Disp: 60 tablet, Rfl: 3   gabapentin (NEURONTIN) 300 MG capsule, Take 1 capsule (300 mg total) by mouth at bedtime., Disp: 90 capsule, Rfl: 1   levonorgestrel (MIRENA) 20 MCG/24HR IUD, 1 each by Intrauterine route once., Disp: , Rfl:    lisinopril (ZESTRIL) 40 MG tablet, TAKE ONE TABLET BY MOUTH DAILY, Disp: 90 tablet, Rfl: 1   nitroGLYCERIN (NITROSTAT) 0.4 MG SL tablet, Place 1 tablet (0.4 mg total) under the tongue every 5 (five) minutes as needed for chest pain., Disp: 25 tablet, Rfl: 3   pantoprazole (PROTONIX) 40 MG tablet, TAKE ONE TABLET BY MOUTH DAILY, Disp: 70 tablet, Rfl: 0   spironolactone (ALDACTONE) 50 MG tablet, Take 50 mg by mouth daily., Disp: , Rfl:    buPROPion (WELLBUTRIN XL) 300 MG 24 hr tablet, Take 1 tablet (300 mg total) by mouth daily., Disp: 90 tablet, Rfl: 1  Review of Systems:  Constitutional: Denies fever, chills, diaphoresis, appetite change and fatigue.  HEENT: Denies photophobia, eye pain, redness, hearing loss, ear pain, congestion, sore throat, rhinorrhea, sneezing,  mouth sores, trouble swallowing, neck pain, neck stiffness and tinnitus.   Respiratory: Denies SOB, DOE, cough, chest tightness,  and wheezing.   Cardiovascular: Denies  palpitations and leg swelling.  Gastrointestinal: Denies nausea, vomiting, abdominal pain, diarrhea, constipation, blood in stool and abdominal distention.  Genitourinary: Denies dysuria, urgency, frequency, hematuria, flank pain and difficulty urinating.  Endocrine: Denies: hot or cold intolerance, sweats, changes in hair or nails, polyuria, polydipsia. Musculoskeletal: Denies myalgias,  joint swelling, arthralgias and gait problem.  Skin: Denies pallor, rash and wound.  Neurological: Denies dizziness, seizures, syncope, weakness, light-headedness, numbness  and headaches.  Hematological: Denies adenopathy. Easy bruising, personal or family bleeding history  Psychiatric/Behavioral: Denies suicidal ideation, mood changes, confusion, nervousness, sleep disturbance and agitation    Physical Exam: Vitals:   01/13/21 1505  BP: 140/90  Pulse: (!) 103  Temp: 98.2 F (36.8 C)  TempSrc: Oral  SpO2: 97%  Weight: 267 lb 14.4 oz (121.5 kg)    Body mass index is 45.98 kg/m.   Constitutional: NAD, calm, comfortable Eyes: PERRL, lids and conjunctivae normal ENMT: Mucous membranes are moist.  Respiratory: clear to auscultation bilaterally, no wheezing, no crackles. Normal respiratory effort. No accessory muscle use.  Cardiovascular: Regular rate and rhythm, no murmurs / rubs / gallops. No extremity edema.  Neurologic: Grossly intact and nonfocal Psychiatric: Normal judgment and insight. Alert and oriented x 3. Normal mood.    Impression and Plan:  Chest pain, unspecified type  Upper back pain -Given her comorbidities, of course concerned for cardiac etiology and also possibly aortic dissection. -She has been sent for chest x-ray today. -EKG performed in the office, my personal interpretation: Shows normal sinus rhythm,   no acute ST or T wave changes, she does have a shortened PR interval. -Her chest pain is very atypical for cardiac etiology however and appears to be more likely related to GI issues given it was relieved with burping and with antacids.  She does have a history of GERD.  She has been advised to take her Protonix daily which she has not been. -I have asked her to follow-up with GI and cardiology. -I do not think she warrants urgent/emergent evaluation this week and, although she has been advised to seek emergent care if she has recurrence of severe chest pain.  Cigarette nicotine dependence without complication  -I have discussed tobacco cessation with the patient.  I have counseled the patient regarding the negative impacts of continued tobacco use including but not limited to lung cancer, COPD, and cardiovascular disease.  I have discussed alternatives to tobacco and modalities that may help facilitate tobacco cessation including but not limited to biofeedback, hypnosis, and medications.  Total time spent with tobacco counseling was 4 minutes. -She is in the action phase of change. -We have decided to resume Wellbutrin 300 mg daily. -She agrees to setting up CBT sessions to assist with smoking cessation, information has been provided to her.  Morbid obesity ([place])  -Discussed healthy lifestyle, including increased physical activity and better food choices to promote weight loss. -I believe she would be a good candidate for weight loss surgery.  Bariatric referral has been placed today.  Time spent: 42 minutes reviewing chart, interviewing and examining patient and formulating plan of care. This is in addition to time spent for smoking cessation counseling.   Patient Instructions  -Nice seeing you today!!  -Start wellbutrin 300 mg daily. Make sure you schedule counseling sessions.  -Make sure you arrange follow up with your cardiologist.    Lelon Frohlich, MD [facility]  at [facility]

## 2021-01-17 ENCOUNTER — Encounter: Payer: Self-pay | Admitting: Physician Assistant

## 2021-01-17 ENCOUNTER — Telehealth: Payer: BC Managed Care – PPO | Admitting: Physician Assistant

## 2021-01-17 DIAGNOSIS — U071 COVID-19: Secondary | ICD-10-CM

## 2021-01-17 MED ORDER — MOLNUPIRAVIR EUA 200MG CAPSULE: 4.0000 | ORAL_CAPSULE | Freq: Two times a day (BID) | ORAL | 0 refills | Status: AC

## 2021-01-17 MED ORDER — BENZONATATE 100 MG PO CAPS: 100.0000 mg | ORAL_CAPSULE | Freq: Three times a day (TID) | ORAL | 0 refills | Status: AC

## 2021-01-17 NOTE — Patient Instructions (Signed)
Tammy Hogan, thank you for joining Rodney Booze, PA-C for today's virtual visit.  While this provider is not your primary care provider (PCP), if your PCP is located in our provider database this encounter information will be shared with them immediately following your visit.  Consent: (Patient) Tammy Hogan provided verbal consent for this virtual visit at the beginning of the encounter.  Current Medications:  Current Outpatient Medications:    benzonatate (TESSALON) 100 MG capsule, Take 1 capsule (100 mg total) by mouth every 8 (eight) hours for 5 days., Disp: 15 capsule, Rfl: 0   molnupiravir EUA 200 mg CAPS, Take 4 capsules (800 mg total) by mouth 2 (two) times daily for 5 days., Disp: 40 capsule, Rfl: 0   AMBULATORY NON FORMULARY MEDICATION, Medication Name: Diltiazem ointment 2%- apply to anal sphincter 5 times a day as instructed per Dr. Henrene Pastor, Disp: 30 g, Rfl: 3   amLODipine (NORVASC) 10 MG tablet, Take 1 tablet (10 mg total) by mouth daily., Disp: 90 tablet, Rfl: 3   aspirin 81 MG tablet, Take 81 mg by mouth daily., Disp: , Rfl:    atorvastatin (LIPITOR) 10 MG tablet, TAKE ONE TABLET BY MOUTH DAILY, Disp: 60 tablet, Rfl: 3   buPROPion (WELLBUTRIN XL) 300 MG 24 hr tablet, Take 1 tablet (300 mg total) by mouth daily., Disp: 90 tablet, Rfl: 1   carvedilol (COREG) 25 MG tablet, TAKE ONE TABLET BY MOUTH TWICE A DAY, Disp: 180 tablet, Rfl: 3   clobetasol (TEMOVATE) 0.05 % external solution, clobetasol 0.05 % scalp solution  APP 1 ML EXT AA D, Disp: , Rfl:    clonazePAM (KLONOPIN) 0.5 MG tablet, Talk half tab daily as needed, Disp: 30 tablet, Rfl: 0   finasteride (PROSCAR) 5 MG tablet, Take 1 mg by mouth daily., Disp: , Rfl:    furosemide (LASIX) 20 MG tablet, TAKE ONE TABLET BY MOUTH DAILY, Disp: 60 tablet, Rfl: 3   gabapentin (NEURONTIN) 300 MG capsule, Take 1 capsule (300 mg total) by mouth at bedtime., Disp: 90 capsule, Rfl: 1   levonorgestrel (MIRENA) 20  MCG/24HR IUD, 1 each by Intrauterine route once., Disp: , Rfl:    lisinopril (ZESTRIL) 40 MG tablet, TAKE ONE TABLET BY MOUTH DAILY, Disp: 90 tablet, Rfl: 1   nitroGLYCERIN (NITROSTAT) 0.4 MG SL tablet, Place 1 tablet (0.4 mg total) under the tongue every 5 (five) minutes as needed for chest pain., Disp: 25 tablet, Rfl: 3   pantoprazole (PROTONIX) 40 MG tablet, TAKE ONE TABLET BY MOUTH DAILY, Disp: 70 tablet, Rfl: 0   spironolactone (ALDACTONE) 50 MG tablet, Take 50 mg by mouth daily., Disp: , Rfl:    Medications ordered in this encounter:  Meds ordered this encounter  Medications   molnupiravir EUA 200 mg CAPS    Sig: Take 4 capsules (800 mg total) by mouth 2 (two) times daily for 5 days.    Dispense:  40 capsule    Refill:  0    Order Specific Question:   Supervising Provider    Answer:   MILLER, BRIAN [[id number]]   benzonatate (TESSALON) 100 MG capsule    Sig: Take 1 capsule (100 mg total) by mouth every 8 (eight) hours for 5 days.    Dispense:  15 capsule    Refill:  0    Order Specific Question:   Supervising Provider    Answer:   Sabra Heck, BRIAN [id number]     *If you need refills on other medications prior to  your next appointment, please contact your pharmacy*  Follow-Up: Call back or seek an in-person evaluation if the symptoms worsen or if the condition fails to improve as anticipated.  Other Instructions Take molnupirovir and tessalon as directed. Please follow up with your regular doctor in 5-7 days for reassessment   If you have been instructed to have an in-person evaluation today at a local Urgent Care facility, please use the link below. It will take you to a list of all of our available [facility], including address, phone number and hours of operation. Please do not delay care.  [facility]  If you or a family member do not have a primary care provider, use the link below to schedule a visit and establish care. When you choose a [facility] primary  care physician or advanced practice provider, you gain a long-term partner in health. Find a Primary Care Provider  Learn more about [facility]'s in-office and virtual care options: [facility]

## 2021-01-17 NOTE — Progress Notes (Signed)
Based on the information that you have shared in the e-Visit Questionnaire, we recommend that you convert this visit to a video visit in order for the provider to better assess what is going on.  The provider will be able to give you a more accurate diagnosis and treatment plan if we can more freely discuss your symptoms and with the addition of a virtual examination.   You are positive for COVID and have health issues that raise your risks of more significant illness and complications of infections. As such, antivirals should be considered. These cannot be provided via e-visit so we request you submit a virtual urgent care visit.   If you convert to a video visit, we will bill your insurance (similar to an office visit) and you will not be charged for this e-Visit. You will be able to stay at home and speak with the first available [facility] advanced practice provider. The link to do a video visit is in the drop down Menu tab of your Welcome screen in [place].

## 2021-01-17 NOTE — Progress Notes (Addendum)
Tammy Hogan, Tammy Hogan are scheduled for a virtual visit with your provider today.    Just as we do with appointments in the office, we must obtain your consent to participate.  Your consent will be active for this visit and any virtual visit you may have with one of our providers in the next 365 days.    If you have a MyChart account, I can also send a copy of this consent to you electronically.  All virtual visits are billed to your insurance company just like a traditional visit in the office.  As this is a virtual visit, video technology does not allow for your provider to perform a traditional examination.  This may limit your provider's ability to fully assess your condition.  If your provider identifies any concerns that need to be evaluated in person or the need to arrange testing such as labs, EKG, etc, we will make arrangements to do so.    Although advances in technology are sophisticated, we cannot ensure that it will always work on either your end or our end.  If the connection with a video visit is poor, we may have to switch to a telephone visit.  With either a video or telephone visit, we are not always able to ensure that we have a secure connection.   I need to obtain your verbal consent now.   Are you willing to proceed with your visit today?   Stefannie Messerli has provided verbal consent on 01/17/2021 for a virtual visit (video or telephone).   Rodney Booze, PA-C 01/17/2021  12:40 PM  Tammy Hogan are scheduled for a virtual visit with your provider today.    Just as we do with appointments in the office, we must obtain your consent to participate.  Your consent will be active for this visit and any virtual visit you may have with one of our providers in the next 365 days.    If you have a MyChart account, I can also send a copy of this consent to you electronically.  All virtual visits are billed to your insurance company just like a traditional visit in the  office.  As this is a virtual visit, video technology does not allow for your provider to perform a traditional examination.  This may limit your provider's ability to fully assess your condition.  If your provider identifies any concerns that need to be evaluated in person or the need to arrange testing such as labs, EKG, etc, we will make arrangements to do so.    Although advances in technology are sophisticated, we cannot ensure that it will always work on either your end or our end.  If the connection with a video visit is poor, we may have to switch to a telephone visit.  With either a video or telephone visit, we are not always able to ensure that we have a secure connection.   I need to obtain your verbal consent now.   Are you willing to proceed with your visit today?   Tammy Hogan has provided verbal consent on 01/17/2021 for a virtual visit (video or telephone).   Rodney Booze, PA-C 01/17/2021  12:40 PM   Date:  01/17/2021   ID:  Maryland Pink, DOB 12-05-1975, MRN [id number]  Patient Location: Home Provider Location: Home Office   Participants: Patient and Provider for Visit and Wrap up  Method of visit: Video  Location of Patient: Home Location of Provider: Home Office  Consent was obtain for visit over the video. Services rendered by provider: Visit was performed via video  A video enabled telemedicine application was used and I verified that I am speaking with the correct person using two identifiers.  PCP:  Isaac Bliss, Rayford Halsted, MD   Chief Complaint:  covid  History of Present Illness:    Tammy Hogan is a 45 y.o. female with history as stated below. Presents video telehealth for an acute care visit  Pt states she tested positive for covid last night. She is c/o fatigue, cough, body aches. Denies sob, congestion, fevers. States that her symptoms started 5 days ago. Denies chest pain. Denies vomiting or diarrhea.  She has tried  taking coricidin and has had mild relief.  Past Medical, Surgical, Social History, Allergies, and Medications have been Reviewed.  Past Medical History:  Diagnosis Date   Anal fissure    Anemia    past hx    Anginal pain ([place])    Anxiety    [place], [place]   CHF (congestive heart failure) (HCC)    systolic heart failure [id number]   Depression    GERD (gastroesophageal reflux disease)    History of cardiac cath 07/23/2012   Dr Irish Lack   History of echocardiogram    Cardiomyopathy-reduced EF 35-40% with wall m otion abnormality concerning for ischemia. Hypokinesis of the anterior and anteroseptal walls to the apex   HPV (human papilloma virus) infection    Hyperlipidemia    Hypertension    PCOS (polycystic ovarian syndrome)    pt denies   PPD positive, treated 2008   [place], [place]    Current Meds  Medication Sig   benzonatate (TESSALON) 100 MG capsule Take 1 capsule (100 mg total) by mouth every 8 (eight) hours for 5 days.   molnupiravir EUA 200 mg CAPS Take 4 capsules (800 mg total) by mouth 2 (two) times daily for 5 days.     Allergies:   Percocet [oxycodone-acetaminophen]   ROS See HPI for history of present illness.  Physical Exam Vitals reviewed.  HENT:     Head: Normocephalic.  Pulmonary:     Effort: Pulmonary effort is normal.     Comments: No tachypnea Neurological:     Mental Status: She is alert.   MDM: Pt with covid. Sxs ongoing for 5 days. Rx for molnupirovir and tessalon given. Advised on f/u plan.             There are no diagnoses linked to this encounter.   Time:   Today, I have spent 8 minutes with the patient with telehealth technology discussing the above problems, reviewing the chart, previous notes, medications and orders.    Tests Ordered: No orders of the defined types were placed in this encounter.   Medication Changes: Meds ordered this encounter  Medications   molnupiravir EUA 200 mg CAPS    Sig: Take 4 capsules (800 mg total) by  mouth 2 (two) times daily for 5 days.    Dispense:  40 capsule    Refill:  0    Order Specific Question:   Supervising Provider    Answer:   MILLER, BRIAN [[id number]]   benzonatate (TESSALON) 100 MG capsule    Sig: Take 1 capsule (100 mg total) by mouth every 8 (eight) hours for 5 days.    Dispense:  15 capsule    Refill:  0    Order Specific Question:   Supervising Provider  Answer:   Noemi Chapel [[id number]]     Disposition:  Follow up  Signed, Rodney Booze, PA-C  01/17/2021 12:40 PM

## 2021-03-20 ENCOUNTER — Telehealth: Payer: Self-pay | Admitting: Family Medicine

## 2021-03-20 NOTE — Telephone Encounter (Signed)
Pt is wanting to Transfer her care to Dr. [name] from Isaac Bliss, Rayford Halsted, MD. Let me know and I can schedule her an appointment.

## 2021-03-24 ENCOUNTER — Other Ambulatory Visit: Payer: Self-pay | Admitting: Internal Medicine

## 2021-03-24 DIAGNOSIS — K219 Gastro-esophageal reflux disease without esophagitis: Secondary | ICD-10-CM

## 2021-03-24 DIAGNOSIS — G5601 Carpal tunnel syndrome, right upper limb: Secondary | ICD-10-CM

## 2021-04-11 ENCOUNTER — Encounter: Payer: BC Managed Care – PPO | Admitting: Family Medicine

## 2021-04-21 ENCOUNTER — Other Ambulatory Visit: Payer: Self-pay

## 2021-04-21 ENCOUNTER — Encounter: Payer: Self-pay | Admitting: Family Medicine

## 2021-04-21 ENCOUNTER — Ambulatory Visit: Payer: BC Managed Care – PPO | Admitting: Family Medicine

## 2021-04-21 VITALS — BP 164/90 | HR 104 | Temp 97.6°F | Ht 64.0 in | Wt 267.2 lb

## 2021-04-21 DIAGNOSIS — I5022 Chronic systolic (congestive) heart failure: Secondary | ICD-10-CM

## 2021-04-21 DIAGNOSIS — Z23 Encounter for immunization: Secondary | ICD-10-CM | POA: Diagnosis not present

## 2021-04-21 DIAGNOSIS — L732 Hidradenitis suppurativa: Secondary | ICD-10-CM

## 2021-04-21 DIAGNOSIS — E782 Mixed hyperlipidemia: Secondary | ICD-10-CM | POA: Diagnosis not present

## 2021-04-21 DIAGNOSIS — F411 Generalized anxiety disorder: Secondary | ICD-10-CM

## 2021-04-21 DIAGNOSIS — L858 Other specified epidermal thickening: Secondary | ICD-10-CM | POA: Insufficient documentation

## 2021-04-21 DIAGNOSIS — Z114 Encounter for screening for human immunodeficiency virus [HIV]: Secondary | ICD-10-CM

## 2021-04-21 DIAGNOSIS — F17200 Nicotine dependence, unspecified, uncomplicated: Secondary | ICD-10-CM

## 2021-04-21 DIAGNOSIS — Z1159 Encounter for screening for other viral diseases: Secondary | ICD-10-CM

## 2021-04-21 DIAGNOSIS — K602 Anal fissure, unspecified: Secondary | ICD-10-CM | POA: Insufficient documentation

## 2021-04-21 DIAGNOSIS — G5601 Carpal tunnel syndrome, right upper limb: Secondary | ICD-10-CM

## 2021-04-21 DIAGNOSIS — Z8639 Personal history of other endocrine, nutritional and metabolic disease: Secondary | ICD-10-CM

## 2021-04-21 DIAGNOSIS — L669 Cicatricial alopecia, unspecified: Secondary | ICD-10-CM

## 2021-04-21 DIAGNOSIS — Z131 Encounter for screening for diabetes mellitus: Secondary | ICD-10-CM

## 2021-04-21 MED ORDER — CLINDAMYCIN HCL 150 MG PO CAPS: 150.0000 mg | ORAL_CAPSULE | Freq: Three times a day (TID) | ORAL | 0 refills | Status: DC

## 2021-04-21 MED ORDER — BUPROPION HCL ER (XL) 300 MG PO TB24: 300.0000 mg | ORAL_TABLET | Freq: Every day | ORAL | 1 refills | Status: DC

## 2021-04-21 MED ORDER — CLONAZEPAM 0.5 MG PO TABS: ORAL_TABLET | ORAL | 0 refills | Status: DC

## 2021-04-21 NOTE — Progress Notes (Signed)
[facility] [address] [place] [address] Dept: [phone] Dept Fax: [phone]  New Patient Office Visit  Subjective:    Patient ID: Tammy Hogan, female    DOB: 01/19/1976, 45 y.o..   MRN: [id number]  Chief Complaint  Patient presents with   Establish Care    [facility]-  establish care.  C/o getting abscess in groin area off/on and also having dry skin.  Will get slu shot today.      History of Present Illness:  Patient is in today to establish care. She was born in [place], [place]. She moved to [place], [place] in 2007, and then [place] in 2014. Tammy Hogan has an [place] from [employer] and is currently working on a degree in Education administrator through [employer]. 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 Tammy Hogan has been married less than 2 years. She has no children. She smokes 7-10 cigarettes a day. She has tired to quit and notes bupropion has helped her limit her use. She had tried Chantix, but felt very weak while on the medication.  She only uses alcohol very occasionally and denies drug use.  Tammy Hogan has a history of cardiomyopathy with systolic heart failure, hypertension and hyperlipidemia. She is managed on amlodipine, carvedilol, furosemide, lisinopril, and a daily aspirin. She is on spironolactone, but for another condition. She is also on atorvastatin for lipid management.  Tammy Hogan Has a history of GERD managed with Protonix.  Tammy Hogan has a history of right carpal tunnel syndrome. Apparently, she is managed on gabapentin and bedtime for associated neuropathy, having never pursued a carpal tunnel release.  Tammy Hogan has a history of generalized anxiety, and is managed on bupropion and Klonipin. She uses the Klonopin more for situation anxiety related to plane flight.  Tammy Hogan notes a prior history of an anal fissure. She also describes recurrent small abscess/boil in the  ara under her breast and her groin. She does recall being told this was hidradenitis in the past. She notes she has a current boil in the groin that recently ruptured, but she feels like she is getting a further new boil.  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 She is managed on finasteride and spironolactone.  Tammy Hogan complains of diffuse rash ont he foremas that has been present for some time. She use lotion, but this does not seem to resolve the issue.  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   Gastroesophageal reflux disease 06/20/2016   Mixed hyperlipidemia 05/30/2016   Cervicalgia 05/21/2016   Generalized anxiety disorder 05/15/2016   Arthralgia of multiple joints 05/15/2016   BMI 45.0-49.9, adult ([place]) 05/15/2016   Cardiomyopathy ([place]) 86/76/7209   Systolic heart failure ([place]) 07/02/2012   HTN (hypertension), malignant 07/01/2012   Tobacco use disorder 07/01/2012   Past Surgical History:  Procedure Laterality Date   CARDIAC CATHETERIZATION  feb, 2014   Dr Irish Lack   FOOT SURGERY  10/2019   ORIF ANKLE FRACTURE Left 07/10/2013   Procedure: OPEN REDUCTION INTERNAL FIXATION (ORIF) LEFT BIMALLEOLAR ANKLE FRACTURE;  Surgeon: Mcarthur Rossetti, MD;  Location: [facility];  Service:  Orthopedics;  Laterality: Left;    Family History  Problem Relation Age of Onset   Hypertension Mother    Hyperlipidemia Mother    Colon polyps Mother    Other Father        drug overdose   Drug abuse Father        overdose   Hypertension Sister    Diabetes Maternal Uncle    Diabetes Maternal Grandmother    Cancer Maternal Grandmother    Heart failure  Paternal Grandmother    Hypertension Paternal Grandmother    Diabetes Paternal Grandmother    Glaucoma Paternal Grandmother    Cirrhosis Paternal Grandfather    Alcohol abuse Paternal Grandfather    Cancer Other 57       breast, cervical and colon cancer   Colon cancer Neg Hx    Esophageal cancer Neg Hx    Rectal cancer Neg Hx    Stomach cancer Neg Hx    Outpatient Medications Prior to Visit  Medication Sig Dispense Refill   AMBULATORY NON FORMULARY MEDICATION Medication Name: Diltiazem ointment 2%- apply to anal sphincter 5 times a day as instructed per Dr. Henrene Pastor 30 g 3   amLODipine (NORVASC) 10 MG tablet Take 1 tablet (10 mg total) by mouth daily. 90 tablet 3   aspirin 81 MG tablet Take 81 mg by mouth daily.     atorvastatin (LIPITOR) 10 MG tablet TAKE ONE TABLET BY MOUTH DAILY 60 tablet 3   carvedilol (COREG) 25 MG tablet TAKE ONE TABLET BY MOUTH TWICE A DAY 180 tablet 3   clindamycin (CLINDAGEL) 1 % gel clindamycin 1 % topical gel  APPLY A THIN LAYER TO THE AFFECTED AREA(S) BY TOPICAL ROUTE 2 TIMES PER DAY     finasteride (PROSCAR) 5 MG tablet Take 1 mg by mouth daily.     furosemide (LASIX) 20 MG tablet TAKE ONE TABLET BY MOUTH DAILY 60 tablet 3   gabapentin (NEURONTIN) 300 MG capsule TAKE ONE CAPSULE BY MOUTH EVERY NIGHT AT BEDTIME 90 capsule 1   levonorgestrel (MIRENA) 20 MCG/24HR IUD 1 each by Intrauterine route once.     lisinopril (ZESTRIL) 40 MG tablet TAKE ONE TABLET BY MOUTH DAILY 90 tablet 1   nitroGLYCERIN (NITROSTAT) 0.4 MG SL tablet Place 1 tablet (0.4 mg total) under the tongue every 5 (five) minutes as needed for chest pain. 25 tablet 3   pantoprazole (PROTONIX) 40 MG tablet TAKE ONE TABLET BY MOUTH DAILY 70 tablet 0   spironolactone (ALDACTONE) 50 MG tablet Take 50 mg by mouth daily.     buPROPion (WELLBUTRIN XL) 300 MG 24 hr tablet Take 1 tablet (300 mg total) by mouth daily. 90 tablet 1   clobetasol (TEMOVATE) 0.05 % external solution clobetasol 0.05 % scalp  solution  APP 1 ML EXT AA D (Patient not taking: Reported on 04/21/2021)     clonazePAM (KLONOPIN) 0.5 MG tablet Talk half tab daily as needed (Patient not taking: Reported on 04/21/2021) 30 tablet 0   No facility-administered medications prior to visit.   Allergies  Allergen Reactions   Percocet [Oxycodone-Acetaminophen] Nausea Only   Objective:   Today's Vitals   04/21/21 1336  BP: (!) 164/90  Pulse: (!) 104  Temp: 97.6 F (36.4 C)  TempSrc: Temporal  SpO2: 99%  Weight: 267 lb 3.2 oz (121.2 kg)  Height: 5\' 4"  (1.626 m)   Body mass index is 45.86 kg/m.   General: Well developed, well nourished. No acute distress. Skin; There are small  papular changes that are ina follicular pattern on the forearms without sign of erythema. Psych: Alert and oriented. Normal mood and affect.  Health Maintenance Due  Topic Date Due   Hepatitis C Screening  Never done   Imaging: Echocardiogram (06/08/2020) IMPRESSIONS   1. Left ventricular ejection fraction, by estimation, is 50 to 55%. The left ventricle has low normal function. The left ventricle has no regional  wall motion abnormalities. There is mild concentric left ventricular hypertrophy. Left ventricular diastolic parameters are consistent with Grade I diastolic dysfunction  (impaired relaxation).   2. Right ventricular systolic function is normal. The right ventricular size is normal.   3. The mitral valve is grossly normal. Mild mitral valve regurgitation.   4. The aortic valve is grossly normal. Aortic valve regurgitation is not visualized. No aortic stenosis is present.   5. The inferior vena cava is normal in size with greater than 50% respiratory variability, suggesting right atrial pressure of 3 mmHg.     Assessment & Plan:   1. Chronic systolic heart failure (HCC) Currently appears compensated, managed on carvedilol, lisinopril. and furosemide. - Basic metabolic panel  2. Tobacco use disorder I advised Tammy Hogan of the  importance of stopping tobacco use. She notes she speaks with her cardiologist about this annual, but has been unable to quit.  3. Generalized anxiety disorder Stable on Wellbutrin and occasional Klonipin.  - buPROPion (WELLBUTRIN XL) 300 MG 24 hr tablet; Take 1 tablet (300 mg total) by mouth daily.  Dispense: 90 tablet; Refill: 1 - clonazePAM (KLONOPIN) 0.5 MG tablet; Talk half tab daily as needed  Dispense: 30 tablet; Refill: 0  4. Mixed hyperlipidemia I will recheck her lipids to make sure she is at goal on low to moderate intensity statin therapy. - Lipid panel  5. Central centrifugal scarring alopecia Continue finasteride and spironolactone  6. Carpal tunnel syndrome of right wrist Continue gabapentin.  7. History of vitamin D deficiency I will check a Vitamin d level to assess adequacy of her current supplementation.  - VITAMIN D 25 Hydroxy (Vit-D Deficiency, Fractures)  8. Hidradenitis suppurativa I will treat her with a course of clindamycin to see if this will resolve her current abscesses. If this recurs, I would consider a dermatology referral to assist in a long-term management plan.  - clindamycin (CLEOCIN) 150 MG capsule; Take 1 capsule (150 mg total) by mouth 3 (three) times daily.  Dispense: 30 capsule; Refill: 0  9. Keratosis pilaris Tammy Hogan' rash is consistent with KP. I recommend she use a urea-containing lotion, such as CeraVe to help remove the kerating plugs.  10. Screening for HIV (human immunodeficiency virus)  - HIV Antibody (routine testing w rflx)  11. Screening for diabetes mellitus (DM)  - Hemoglobin A1c  12. Encounter for hepatitis C screening test for low risk patient  - HCV Ab w Reflex to Quant PCR  13. Need for influenza vaccination  - Flu Vaccine QUAD 6+ mos PF IM (Fluarix Quad PF)  Haydee Salter, MD

## 2021-04-25 NOTE — Addendum Note (Signed)
Addended by: Lynnea Ferrier on: 04/25/2021 02:30 PM   Modules accepted: Orders

## 2021-04-26 ENCOUNTER — Other Ambulatory Visit: Payer: BC Managed Care – PPO

## 2021-04-27 ENCOUNTER — Encounter (HOSPITAL_COMMUNITY): Payer: Self-pay | Admitting: Emergency Medicine

## 2021-04-27 ENCOUNTER — Ambulatory Visit (HOSPITAL_COMMUNITY)
Admission: EM | Admit: 2021-04-27 | Discharge: 2021-04-27 | Disposition: A | Discharge status: Home / Self Care | Payer: BC Managed Care – PPO | Attending: Internal Medicine | Admitting: Internal Medicine

## 2021-04-27 ENCOUNTER — Other Ambulatory Visit: Payer: Self-pay

## 2021-04-27 DIAGNOSIS — M549 Dorsalgia, unspecified: Secondary | ICD-10-CM

## 2021-04-27 MED ORDER — IBUPROFEN 600 MG PO TABS: 600.0000 mg | ORAL_TABLET | Freq: Four times a day (QID) | ORAL | 0 refills | Status: DC | PRN

## 2021-04-27 MED ORDER — METHOCARBAMOL 500 MG PO TABS: 500.0000 mg | ORAL_TABLET | Freq: Every evening | ORAL | 0 refills | Status: DC | PRN

## 2021-04-27 NOTE — Discharge Instructions (Addendum)
Entered range of motion exercises Please take medications as prescribed If you have worsening symptoms please return to urgent care to be reevaluated If you have worsening headaches, blurred vision, confusion, nausea or vomiting please go to the emergency room for further evaluation.
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
## 2021-05-09 ENCOUNTER — Ambulatory Visit (INDEPENDENT_AMBULATORY_CARE_PROVIDER_SITE_OTHER): Payer: BC Managed Care – PPO | Admitting: Family Medicine

## 2021-05-09 ENCOUNTER — Encounter: Payer: Self-pay | Admitting: Family Medicine

## 2021-05-09 ENCOUNTER — Other Ambulatory Visit: Payer: Self-pay

## 2021-05-09 VITALS — BP 148/98 | HR 96 | Ht 64.0 in | Wt 267.0 lb

## 2021-05-09 DIAGNOSIS — S29012S Strain of muscle and tendon of back wall of thorax, sequela: Secondary | ICD-10-CM | POA: Diagnosis not present

## 2021-05-09 DIAGNOSIS — M545 Low back pain, unspecified: Secondary | ICD-10-CM
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
## 2021-05-09 NOTE — Patient Instructions (Addendum)
Nice to see you today.  I've referred you to Physical Therapy.  Please let me know if you don't hear from them within one week regarding scheduling.  I've prescribed you Tizanidine.  Take this instead of the methocarbamol.  Use heat.  TENS UNIT: This is helpful for muscle pain and spasm.   Search and Purchase a TENS 7000 2nd edition at  www.tenspros.com or www.[place].com It should be less than $30.     TENS unit instructions: Do not shower or bathe with the unit on Turn the unit off before removing electrodes or batteries If the electrodes lose stickiness add a drop of water to the electrodes after they are disconnected from the unit and place on plastic sheet. If you continued to have difficulty, call the TENS unit company to purchase more electrodes. Do not apply lotion on the skin area prior to use. Make sure the skin is clean and dry as this will help prolong the life of the electrodes. After use, always check skin for unusual red areas, rash or other skin difficulties. If there are any skin problems, does not apply electrodes to the same area. Never remove the electrodes from the unit by pulling the wires. Do not use the TENS unit or electrodes other than as directed. Do not change electrode placement without consultating your therapist or physician. Keep 2 fingers with between each electrode. Wear time ratio is 2:1, on to off times.    For example on for 30 minutes off for 15 minutes and then on for 30 minutes off for 15 minutes    Follow-up: 4 weeks

## 2021-05-10 ENCOUNTER — Other Ambulatory Visit: Payer: Self-pay

## 2021-05-10 ENCOUNTER — Other Ambulatory Visit (INDEPENDENT_AMBULATORY_CARE_PROVIDER_SITE_OTHER): Payer: BC Managed Care – PPO

## 2021-05-10 DIAGNOSIS — I5022 Chronic systolic (congestive) heart failure: Secondary | ICD-10-CM

## 2021-05-10 DIAGNOSIS — Z8639 Personal history of other endocrine, nutritional and metabolic disease: Secondary | ICD-10-CM

## 2021-05-10 DIAGNOSIS — E782 Mixed hyperlipidemia: Secondary | ICD-10-CM

## 2021-05-10 DIAGNOSIS — Z1159 Encounter for screening for other viral diseases: Secondary | ICD-10-CM

## 2021-05-10 DIAGNOSIS — Z131 Encounter for screening for diabetes mellitus: Secondary | ICD-10-CM | POA: Diagnosis not present

## 2021-05-10 DIAGNOSIS — Z114 Encounter for screening for human immunodeficiency virus [HIV]: Secondary | ICD-10-CM

## 2021-05-10 LAB — BASIC METABOLIC PANEL
BUN: 13 mg/dL (ref 6–23)
CO2: 26 mEq/L (ref 19–32)
Calcium: 9.4 mg/dL (ref 8.4–10.5)
Chloride: 104 mEq/L (ref 96–112)
Creatinine, Ser: 0.92 mg/dL (ref 0.40–1.20)
GFR: 74.75 mL/min (ref >60.00)
Glucose, Bld: 110 mg/dL — ABNORMAL HIGH (ref 70–99)
Potassium: 3.8 mEq/L (ref 3.5–5.1)
Sodium: 139 mEq/L (ref 135–145)

## 2021-05-10 LAB — HEMOGLOBIN A1C: Hgb A1c MFr Bld: 5.6 % (ref 4.6–6.5)

## 2021-05-10 LAB — LIPID PANEL
Cholesterol: 161 mg/dL (ref 0–200)
HDL: 34.4 mg/dL — ABNORMAL LOW (ref >39.00)
LDL Cholesterol: 96 mg/dL (ref 0–99)
NonHDL: 126.44
Total CHOL/HDL Ratio: 5
Triglycerides: 152 mg/dL — ABNORMAL HIGH (ref 0.0–149.0)
VLDL: 30.4 mg/dL (ref 0.0–40.0)

## 2021-05-10 LAB — VITAMIN D 25 HYDROXY (VIT D DEFICIENCY, FRACTURES): VITD: 35.51 ng/mL (ref 30.00–100.00)

## 2021-05-11 LAB — HCV INTERPRETATION

## 2021-05-11 LAB — HCV AB W REFLEX TO QUANT PCR: HCV Ab: 0.3 s/co ratio (ref 0.0–0.9)

## 2021-05-11 LAB — HIV ANTIBODY (ROUTINE TESTING W REFLEX): HIV 1&2 Ab, 4th Generation: NONREACTIVE

## 2021-05-12 ENCOUNTER — Telehealth: Payer: Self-pay | Admitting: Family Medicine

## 2021-05-12 NOTE — Telephone Encounter (Signed)
Patient called in regards to the prescription for Tizanidine that was to be sent in at her visit on 05/09/2021. Can this be sent in for her?  Please advise.

## 2021-05-15 MED ORDER — TIZANIDINE HCL 4 MG PO TABS: 4.0000 mg | ORAL_TABLET | Freq: Three times a day (TID) | ORAL | 2 refills | Status: DC | PRN

## 2021-05-15 NOTE — Telephone Encounter (Signed)
Medicine sent to [facility]

## 2021-05-17 ENCOUNTER — Ambulatory Visit: Payer: BC Managed Care – PPO | Attending: Family Medicine | Admitting: Physical Therapy

## 2021-05-17 ENCOUNTER — Other Ambulatory Visit: Payer: Self-pay

## 2021-05-17 DIAGNOSIS — G8929 Other chronic pain: Secondary | ICD-10-CM | POA: Diagnosis present

## 2021-05-17 DIAGNOSIS — M25511 Pain in right shoulder: Secondary | ICD-10-CM | POA: Diagnosis present

## 2021-05-17 DIAGNOSIS — S29012S Strain of muscle and tendon of back wall of thorax, sequela: Secondary | ICD-10-CM | POA: Insufficient documentation

## 2021-05-17 DIAGNOSIS — M545 Low back pain, unspecified: Secondary | ICD-10-CM | POA: Insufficient documentation

## 2021-05-17 DIAGNOSIS — R2689 Other abnormalities of gait and mobility: Secondary | ICD-10-CM | POA: Diagnosis present

## 2021-05-17 DIAGNOSIS — M6281 Muscle weakness (generalized): Secondary | ICD-10-CM | POA: Diagnosis present

## 2021-05-17 NOTE — Addendum Note (Signed)
Addended by: Colbert Ewing MARIE L on: 05/17/2021 05:25 PM   Modules accepted: Orders
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
## 2021-05-18 ENCOUNTER — Other Ambulatory Visit: Payer: Self-pay | Admitting: Internal Medicine

## 2021-05-18 DIAGNOSIS — K219 Gastro-esophageal reflux disease without esophagitis: Secondary | ICD-10-CM

## 2021-05-23 ENCOUNTER — Ambulatory Visit: Payer: BC Managed Care – PPO | Admitting: Physical Therapy

## 2021-05-23 ENCOUNTER — Other Ambulatory Visit: Payer: Self-pay

## 2021-05-23 DIAGNOSIS — G8929 Other chronic pain: Secondary | ICD-10-CM

## 2021-05-23 DIAGNOSIS — M545 Low back pain, unspecified: Secondary | ICD-10-CM

## 2021-05-23 DIAGNOSIS — M6281 Muscle weakness (generalized): Secondary | ICD-10-CM

## 2021-05-23 NOTE — Therapy (Signed)
[place]. [place], [place], [address] Phone: [phone]   Fax:  [phone]  Physical Therapy Treatment  Patient Details  Name: Tammy Hogan MRN: [id number] Date of Birth: 03/12/1976 Referring Provider (PT): Gregor Hams, MD   Encounter Date: 05/23/2021   PT End of Session - 05/23/21 1310     Visit Number 2    Number of Visits 6    Date for PT Re-Evaluation 06/28/21    Authorization Type [employer]    PT Start Time 31    PT Stop Time 1315    PT Time Calculation (min) 45 min             Past Medical History:  Diagnosis Date   Anal fissure    Anemia    past hx    Anginal pain ([place])    [place], [place]   Bimalleolar fracture of left ankle 07/10/2013   CHF (congestive heart failure) ([place])    systolic heart failure 2094   Depression    GERD (gastroesophageal reflux disease)    History of cardiac cath 07/23/2012   Dr Irish Lack   History of echocardiogram    Cardiomyopathy-reduced EF 35-40% with wall m otion abnormality concerning for ischemia. Hypokinesis of the anterior and anteroseptal walls to the apex   HPV (human papilloma virus) infection    Hyperlipidemia    Hypertension    PCOS (polycystic ovarian syndrome)    pt denies   PPD positive, treated 2008   [place], [place]    Past Surgical History:  Procedure Laterality Date   CARDIAC CATHETERIZATION  feb, 2014   Dr Irish Lack   FOOT SURGERY  10/2019   ORIF ANKLE FRACTURE Left 07/10/2013   Procedure: OPEN REDUCTION INTERNAL FIXATION (ORIF) LEFT BIMALLEOLAR ANKLE FRACTURE;  Surgeon: Mcarthur Rossetti, MD;  Location: [facility];  Service: Orthopedics;  Laterality: Left;    There were no vitals filed for this visit.   Subjective Assessment - 05/23/21 1230     Subjective HEP going well. Pt verb HEP.states TENS is giving her relief with shld blade. LB hurts    Currently in Pain? Yes    Pain Score 6     Pain Location Back    Pain  Orientation Lower                               [facility] Adult PT Treatment/Exercise - 05/23/21 0001       Self-Care   Self-Care Lifting;Other Self-Care Comments;Posture   job related activites,static standing ( lean,wt shift,ext and foot prop) and computer/desk set up for good support and ergonomics     Exercises   Exercises Shoulder;Lumbar;Knee/Hip      Lumbar Exercises: Supine   Ab Set 15 reps;3 seconds   with ball   Other Supine Lumbar Exercises feet on ball bridge,KTC and obl    Other Supine Lumbar Exercises clam and hip flex with blue tband   ADD ball squeeze     Knee/Hip Exercises: Standing   Hip Abduction Stengthening;Both;15 reps;Knee straight   red tband   Hip Extension Both;Stengthening;15 reps;Knee straight   red tband     Shoulder Exercises: Standing   External Rotation Strengthening;Both;12 reps;Theraband    Theraband Level (Shoulder External Rotation) Level 2 (Red)    ABduction Strengthening;Both;12 reps;Theraband    Theraband Level (Shoulder ABduction) Level 2 (Red)  Extension Strengthening;Both;12 reps;Theraband    Theraband Level (Shoulder Extension) Level 2 (Red)    Row Strengthening;Both;12 reps;Theraband    Theraband Level (Shoulder Row) Level 2 (Red)      Shoulder Exercises: ROM/Strengthening   UBE (Upper Arm Bike) L 2 2 min fwd/2 min back      Manual Therapy   Manual Therapy Passive ROM    Manual therapy comments chceked SI alignment and =    Passive ROM BIL LE and trunk                       PT Short Term Goals - 05/23/21 1242       PT SHORT TERM GOAL #1   Title independent with initial HEP    Status Achieved      PT SHORT TERM GOAL #2   Title She will report 10-20% inmprovement in pain with use of RT arm    Baseline varies, increases with work activies    Status On-going      PT SHORT TERM GOAL #3   Title RT shoulder flexion will improve to equal LT shoulder    Status Achieved               PT  Long Term Goals - 05/17/21 1720       PT LONG TERM GOAL #1   Title Independent with advanced HEP    Time 6    Period Weeks    Status New    Target Date 06/28/21      PT LONG TERM GOAL #2   Title Pt will report 75% decrease in back and midthoracic pain    Time 6    Period Weeks    Status New    Target Date 06/28/21      PT LONG TERM GOAL #3   Title BUE and LE strength grossly 4+/5 with </= 2/10 pain    Time 6    Period Weeks    Status New    Target Date 06/28/21      PT LONG TERM GOAL #4   Title Pt will have L=R cervical and lumbar ROM    Time 6    Period Weeks    Status New    Target Date 06/28/21                   Plan - 05/23/21 1310     Clinical Impression Statement progressing with STGs. progressed ex and added scap stab for HEP. good alignmemt of SI . pt had trouble relaxing with PROM and needed cues with stab and core actviation. educ on body mech and better job related actvities    PT Treatment/Interventions ADLs/Self Care Home Management;Cryotherapy;Electrical Stimulation;Iontophoresis 4mg /ml Dexamethasone;Moist Heat;Traction;Gait training;Stair training;Functional mobility training;Therapeutic activities;Therapeutic exercise;Balance training;Neuromuscular re-education;Manual techniques;Orthotic Fit/Training;Dry needling;Passive range of motion;Taping    PT Next Visit Plan assess and progress             Patient will benefit from skilled therapeutic intervention in order to improve the following deficits and impairments:  Increased fascial restricitons, Pain, Decreased activity tolerance, Improper body mechanics, Decreased mobility, Decreased strength, Postural dysfunction, Impaired flexibility, Hypomobility  Visit Diagnosis: Acute left-sided low back pain without sciatica  Muscle weakness (generalized)  Chronic right shoulder pain     Problem List Patient Active Problem List   Diagnosis Date Noted   Hidradenitis suppurativa 04/21/2021    Anal fissure 04/21/2021   Keratosis pilaris 04/21/2021   Trochanteric bursitis of  right hip 08/01/2020   Carpal tunnel syndrome of right wrist 06/26/2018   Left knee tendonitis 05/11/2018   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 [place], [place], [address] Phone: [phone]   Fax:  [phone]  Name: Hinda Lindor MRN: [id number] Date of Birth: Sep 11, 1975

## 2021-05-31 ENCOUNTER — Other Ambulatory Visit: Payer: Self-pay

## 2021-05-31 ENCOUNTER — Ambulatory Visit: Payer: BC Managed Care – PPO | Admitting: Physical Therapy

## 2021-05-31 ENCOUNTER — Encounter: Payer: Self-pay | Admitting: Physical Therapy

## 2021-05-31 DIAGNOSIS — R2689 Other abnormalities of gait and mobility: Secondary | ICD-10-CM

## 2021-05-31 DIAGNOSIS — M545 Low back pain, unspecified: Secondary | ICD-10-CM | POA: Diagnosis not present

## 2021-05-31 DIAGNOSIS — G8929 Other chronic pain: Secondary | ICD-10-CM

## 2021-05-31 DIAGNOSIS — M6281 Muscle weakness (generalized): Secondary | ICD-10-CM

## 2021-05-31 NOTE — Therapy (Signed)
[facility] °[facility] °[address] °[place], [place], [address] °Phone: [phone]   Fax:  [phone] ° °Physical Therapy Treatment ° °Patient Details  °Name: Tammy Hogan °MRN: [id number] °Date of Birth: 03/21/1976 °Referring Provider (PT): Corey, Evan S, MD ° ° °Encounter Date: 05/31/2021 ° ° PT End of Session - 05/31/21 1554   ° ° Visit Number 3   ° Date for PT Re-Evaluation 06/28/21   ° PT Start Time 1514   ° PT Stop Time 1555   ° PT Time Calculation (min) 41 min   ° Activity Tolerance Patient tolerated treatment well   ° Behavior During Therapy WFL for tasks assessed/performed   ° °  °  ° °  ° ° °Past Medical History:  °Diagnosis Date  ° Anal fissure   ° Anemia   ° past hx   ° Anginal pain (HCC)   ° Anxiety   ° [place], [place]  ° Bimalleolar fracture of left ankle 07/10/2013  ° CHF (congestive heart failure) (HCC)   ° systolic heart failure 2014  ° Depression   ° GERD (gastroesophageal reflux disease)   ° History of cardiac cath 07/23/2012  ° Dr Varanasi  ° History of echocardiogram   ° Cardiomyopathy-reduced EF 35-40% with wall m otion abnormality concerning for ischemia. Hypokinesis of the anterior and anteroseptal walls to the apex  ° HPV (human papilloma virus) infection   ° Hyperlipidemia   ° Hypertension   ° PCOS (polycystic ovarian syndrome)   ° pt denies  ° PPD positive, treated 2008  ° [place], [place]  ° ° °Past Surgical History:  °Procedure Laterality Date  ° CARDIAC CATHETERIZATION  feb, 2014  ° Dr Varanasi  ° FOOT SURGERY  10/2019  ° ORIF ANKLE FRACTURE Left 07/10/2013  ° Procedure: OPEN REDUCTION INTERNAL FIXATION (ORIF) LEFT BIMALLEOLAR ANKLE FRACTURE;  Surgeon: Christopher Y Blackman, MD;  Location[facility];  Service: Orthopedics;  Laterality: Left;  ° ° °There were no vitals filed for this visit. ° ° Subjective Assessment - 05/31/21 1516   ° ° Subjective Low back is bothering her today.   ° Pertinent History Systolic heart failure 2014, HTN, left ankle  fx with ORIF 2015, hx of heart cath   ° Currently in Pain? Yes   ° Pain Score 5    ° Pain Location Back   ° °  °  ° °  ° ° ° ° ° ° ° ° ° ° ° ° ° ° ° ° ° ° ° ° [facility] Adult PT Treatment/Exercise - 05/31/21 0001   ° °  ° Lumbar Exercises: Stretches  ° Passive Hamstring Stretch Right;Left;3 reps;10 seconds   ° Single Knee to Chest Stretch Right;Left;2 reps;10 seconds   ° Lower Trunk Rotation 3 reps;10 seconds   °  ° Lumbar Exercises: Aerobic  ° Nustep L5 x 6 min   °  ° Lumbar Exercises: Standing  ° Row Strengthening;Both;20 reps;Theraband   ° Theraband Level (Row) Level 2 (Red)   ° Shoulder Extension Strengthening;Both;20 reps;Limitations;Theraband   ° Theraband Level (Shoulder Extension) Level 2 (Red)   °  ° Lumbar Exercises: Supine  ° Ab Set 20 reps;2 seconds   ° Bridge Compliant;10 reps;2 seconds   ° Other Supine Lumbar Exercises feet on ball bridge,KTC and obl   °  ° Knee/Hip Exercises: Seated  ° Sit to Sand 2 sets;10 reps;without UE support   holding yellow ball  ° °  °  ° °  ° ° ° ° ° ° ° ° ° ° ° °   PT Short Term Goals - 05/23/21 1242   ° °  ° PT SHORT TERM GOAL #1  ° Title independent with initial HEP   ° Status Achieved   °  ° PT SHORT TERM GOAL #2  ° Title She will report 10-20% inmprovement in pain with use of RT arm   ° Baseline varies, increases with work activies   ° Status On-going   °  ° PT SHORT TERM GOAL #3  ° Title RT shoulder flexion will improve to equal LT shoulder   ° Status Achieved   ° °  °  ° °  ° ° ° ° PT Long Term Goals - 05/31/21 1555   ° °  ° PT LONG TERM GOAL #1  ° Status Partially Met   ° °  °  ° °  ° ° ° ° ° ° ° ° Plan - 05/31/21 1555   ° ° Clinical Impression Statement Pt reports HEP compliance progressing towards LTG's. Tactile cue needed to squeeze scapulas together with standing rows. Increase fatigue reported with sit to stands. Pt has difficulty relaxing with passive stretching. Bilateral hip tightness with SL K2C   ° Personal Factors and Comorbidities Age;Time since onset of  injury/illness/exacerbation;Profession;Past/Current Experience   ° Examination-Activity Limitations Sit;Stand   ° Examination-Participation Restrictions Occupation;Community Activity;Cleaning   ° Rehab Potential Good   ° PT Frequency 1x / week   ° PT Duration 6 weeks   ° PT Treatment/Interventions ADLs/Self Care Home Management;Cryotherapy;Electrical Stimulation;Iontophoresis 4mg/ml Dexamethasone;Moist Heat;Traction;Gait training;Stair training;Functional mobility training;Therapeutic activities;Therapeutic exercise;Balance training;Neuromuscular re-education;Manual techniques;Orthotic Fit/Training;Dry needling;Passive range of motion;Taping   ° PT Next Visit Plan assess and progress   ° °  °  ° °  ° ° °Patient will benefit from skilled therapeutic intervention in order to improve the following deficits and impairments:  Increased fascial restricitons, Pain, Decreased activity tolerance, Improper body mechanics, Decreased mobility, Decreased strength, Postural dysfunction, Impaired flexibility, Hypomobility ° °Visit Diagnosis: °Acute left-sided low back pain without sciatica ° °Other abnormalities of gait and mobility ° °Chronic right shoulder pain ° °Muscle weakness (generalized) ° ° ° ° °Problem List °Patient Active Problem List  ° Diagnosis Date Noted  ° Hidradenitis suppurativa 04/21/2021  ° Anal fissure 04/21/2021  ° Keratosis pilaris 04/21/2021  ° Trochanteric bursitis of right hip 08/01/2020  ° Carpal tunnel syndrome of right wrist 06/26/2018  ° Left knee tendonitis 05/11/2018  ° 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 °[place], [place], [address] °Phone: [phone]   Fax:  [phone] ° °Name: Nyrie Clason °MRN: [id number] °Date of Birth: 09/14/1975 ° ° ° °

## 2021-06-06 ENCOUNTER — Ambulatory Visit (INDEPENDENT_AMBULATORY_CARE_PROVIDER_SITE_OTHER): Payer: BC Managed Care – PPO

## 2021-06-06 ENCOUNTER — Other Ambulatory Visit: Payer: Self-pay

## 2021-06-06 ENCOUNTER — Ambulatory Visit: Payer: BC Managed Care – PPO | Admitting: Family Medicine

## 2021-06-06 VITALS — BP 138/84 | HR 92 | Ht 64.0 in | Wt 270.0 lb

## 2021-06-06 DIAGNOSIS — M545 Low back pain, unspecified: Secondary | ICD-10-CM | POA: Diagnosis not present

## 2021-06-06 DIAGNOSIS — M546 Pain in thoracic spine: Secondary | ICD-10-CM
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
## 2021-06-06 NOTE — Patient Instructions (Addendum)
Thank you for coming in today.   Please get an Xray today before you leave   If not improving with PT next step is MRI.   Let me know.   Consider recheck in 2 months or after MRI.

## 2021-06-07 ENCOUNTER — Encounter: Payer: Self-pay | Admitting: Family Medicine

## 2021-06-07 ENCOUNTER — Encounter: Payer: Self-pay | Admitting: Physical Therapy

## 2021-06-07 ENCOUNTER — Ambulatory Visit: Payer: BC Managed Care – PPO | Admitting: Physical Therapy

## 2021-06-07 DIAGNOSIS — M545 Low back pain, unspecified: Secondary | ICD-10-CM

## 2021-06-07 DIAGNOSIS — G8929 Other chronic pain: Secondary | ICD-10-CM

## 2021-06-07 DIAGNOSIS — R2689 Other abnormalities of gait and mobility: Secondary | ICD-10-CM

## 2021-06-07 DIAGNOSIS — M6281 Muscle weakness (generalized): Secondary | ICD-10-CM

## 2021-06-07 NOTE — Progress Notes (Signed)
Lumbar spine x-ray shows minimal scoliosis.

## 2021-06-07 NOTE — Therapy (Signed)
[facility] [place]. [place], [place], [address] Phone: [phone]   Fax:  [phone]  Physical Therapy Treatment  Patient Details  Name: Tammy Hogan MRN: [id number] Date of Birth: [birth year] Referring Provider (PT): Gregor Hams, MD   Encounter Date: 06/07/2021   PT End of Session - 06/07/21 1146     Visit Number 4    Number of Visits 6    Date for PT Re-Evaluation 06/28/21    Authorization Type [employer]    PT Start Time 1104    PT Stop Time 1142    PT Time Calculation (min) 38 min    Activity Tolerance Patient tolerated treatment well    Behavior During Therapy [facility] for tasks assessed/performed             Past Medical History:  Diagnosis Date   Anal fissure    Anemia    past hx    Anginal pain ([place])    [place], [place]   Bimalleolar fracture of left ankle 07/10/2013   CHF (congestive heart failure) ([place])    systolic heart failure 1157   Depression    GERD (gastroesophageal reflux disease)    History of cardiac cath 07/23/2012   Dr Irish Lack   History of echocardiogram    Cardiomyopathy-reduced EF 35-40% with wall m otion abnormality concerning for ischemia. Hypokinesis of the anterior and anteroseptal walls to the apex   HPV (human papilloma virus) infection    Hyperlipidemia    Hypertension    PCOS (polycystic ovarian syndrome)    pt denies   PPD positive, treated 2008   [place], [place]    Past Surgical History:  Procedure Laterality Date   CARDIAC CATHETERIZATION  feb, 2014   Dr Irish Lack   FOOT SURGERY  10/2019   ORIF ANKLE FRACTURE Left 07/10/2013   Procedure: OPEN REDUCTION INTERNAL FIXATION (ORIF) LEFT BIMALLEOLAR ANKLE FRACTURE;  Surgeon: Mcarthur Rossetti, MD;  [facility];  Service: Orthopedics;  Laterality: Left;    There were no vitals filed for this visit.   Subjective Assessment - 06/07/21 1105     Subjective I'm a little sore today did lift some light  things maybe 10# from down off a high shelf and then from up off the ground. Not sure if I slept funny. Upper back and shoulders are just generally sore. I have a lot of LBP too in general but not yet because I haven't been out and about much today. Still having n/t in hands.    Pertinent History Systolic heart failure 2620, HTN, left ankle fx with ORIF 2015, hx of heart cath    Patient Stated Goals Improve pain    Currently in Pain? Yes    Pain Score 5     Pain Location --   upper back and shoulders   Pain Orientation Right;Left;Upper    Pain Descriptors / Indicators Sore;Aching    Pain Type Acute pain                               [facility] Adult PT Treatment/Exercise - 06/07/21 0001       Lumbar Exercises: Stretches   Single Knee to Chest Stretch Right;Left;5 reps    Single Knee to Chest Stretch Limitations 5 second holds    Lower Trunk Rotation 5 reps;10 seconds    Other Lumbar Stretch Exercise cat cow and  thoracic rotation reach throughs x10 each      Lumbar Exercises: Supine   Bent Knee Raise 5 reps    Bent Knee Raise Limitations 2 sets, TA set    Dead Bug 10 reps    Dead Bug Limitations TA set    Bridge 15 reps;3 seconds    Other Supine Lumbar Exercises supine marches with TA set 1x10      Shoulder Exercises: Seated   Other Seated Exercises cervical and thoracic excursions 1x20 all directions      Shoulder Exercises: Stretch   Other Shoulder Stretches UT stretch, levator stretch, corner stretch 2x30 seconds                     PT Education - 06/07/21 1145     Education Details exercise form and purpose    Person(s) Educated Patient    Methods Explanation    Comprehension Verbalized understanding;Returned demonstration              PT Short Term Goals - 05/23/21 1242       PT SHORT TERM GOAL #1   Title independent with initial HEP    Status Achieved      PT SHORT TERM GOAL #2   Title She will report 10-20% inmprovement in pain  with use of RT arm    Baseline varies, increases with work activies    Status On-going      PT SHORT TERM GOAL #3   Title RT shoulder flexion will improve to equal LT shoulder    Status Achieved               PT Long Term Goals - 05/31/21 1555       PT LONG TERM GOAL #1   Status Partially Met                   Plan - 06/07/21 1148     Clinical Impression [place] arrives feeling a bit sore today in upper back and shoulders, low back is not hurting just yet but she has not been super active yet today. Worked on cervical and thoracic ROM prior to stretches otherwise worked on functional postural strengthening today. Incorporated lumbar stretches and core strength today as well. Will continue to progress as able.    Personal Factors and Comorbidities Age;Time since onset of injury/illness/exacerbation;Profession;Past/Current Experience    Examination-Activity Limitations Sit;Stand    Examination-Participation Restrictions Occupation;Community Activity;Cleaning    Rehab Potential Good    PT Frequency 1x / week    PT Duration 6 weeks    PT Treatment/Interventions ADLs/Self Care Home Management;Cryotherapy;Electrical Stimulation;Iontophoresis 65m/ml Dexamethasone;Moist Heat;Traction;Gait training;Stair training;Functional mobility training;Therapeutic activities;Therapeutic exercise;Balance training;Neuromuscular re-education;Manual techniques;Orthotic Fit/Training;Dry needling;Passive range of motion;Taping    PT Next Visit Plan assess and progress, update HEP    PT Home Exercise Plan Access Code [email or web address]    Consulted and Agree with Plan of Care Patient             Patient will benefit from skilled therapeutic intervention in order to improve the following deficits and impairments:  Increased fascial restricitons, Pain, Decreased activity tolerance, Improper body mechanics, Decreased mobility, Decreased strength, Postural dysfunction, Impaired flexibility,  Hypomobility  Visit Diagnosis: Acute left-sided low back pain without sciatica  Other abnormalities of gait and mobility  Chronic right shoulder pain  Muscle weakness (generalized)     Problem List Patient Active Problem List   Diagnosis Date Noted   Hidradenitis suppurativa 04/21/2021  Anal fissure 04/21/2021   Keratosis pilaris 04/21/2021   Trochanteric bursitis of right hip 08/01/2020   Carpal tunnel syndrome of right wrist 06/26/2018   Left knee tendonitis 05/11/2018   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 [place], [place], [address] Phone: [phone]   Fax:  [phone]  Name: Pierra Skora MRN: [id number] Date of Birth: 01-04-1976

## 2021-06-07 NOTE — Progress Notes (Signed)
Thoracic spine x-ray shows mild scoliosis and some degenerative changes (arthritis type changes).  No fractures are present.

## 2021-06-14 ENCOUNTER — Encounter: Payer: Self-pay | Admitting: Physical Therapy

## 2021-06-14 ENCOUNTER — Ambulatory Visit: Payer: BC Managed Care – PPO | Attending: Family Medicine | Admitting: Physical Therapy

## 2021-06-14 ENCOUNTER — Other Ambulatory Visit: Payer: Self-pay

## 2021-06-14 DIAGNOSIS — M25511 Pain in right shoulder: Secondary | ICD-10-CM | POA: Diagnosis present

## 2021-06-14 DIAGNOSIS — M6281 Muscle weakness (generalized): Secondary | ICD-10-CM | POA: Diagnosis present

## 2021-06-14 DIAGNOSIS — M545 Low back pain, unspecified: Secondary | ICD-10-CM | POA: Diagnosis present

## 2021-06-14 DIAGNOSIS — R2689 Other abnormalities of gait and mobility: Secondary | ICD-10-CM | POA: Diagnosis present

## 2021-06-14 DIAGNOSIS — G8929 Other chronic pain: Secondary | ICD-10-CM | POA: Insufficient documentation

## 2021-06-14 NOTE — Therapy (Signed)
[place]. [place], [place], [address] Phone: [phone]   Fax:  [phone]  Physical Therapy Treatment  Patient Details  Name: Tammy Hogan MRN: [id number] Date of Birth: 01/20/1976 Referring Provider (PT): Gregor Hams, MD   Encounter Date: 06/14/2021   PT End of Session - 06/14/21 1747     Visit Number 5    Number of Visits 6    Date for PT Re-Evaluation 06/28/21    Authorization Type [employer]ign[employer]    PT Start Time 1530    PT Stop Time 8828    PT Time Calculation (min) 43 min    Activity Tolerance Patient tolerated treatment well    Behavior During Therapy [facility] for tasks assessed/performed             Past Medical History:  Diagnosis Date   Anal fissure    Anemia    past hx    Anginal pain ([place])    [place], [place]   Bimalleolar fracture of left ankle 07/10/2013   CHF (congestive heart failure) ([place])    systolic heart failure 0034   Depression    GERD (gastroesophageal reflux disease)    History of cardiac cath 07/23/2012   Dr Irish Lack   History of echocardiogram    Cardiomyopathy-reduced EF 35-40% with wall m otion abnormality concerning for ischemia. Hypokinesis of the anterior and anteroseptal walls to the apex   HPV (human papilloma virus) infection    Hyperlipidemia    Hypertension    PCOS (polycystic ovarian syndrome)    pt denies   PPD positive, treated 2008   [place], [place]    Past Surgical History:  Procedure Laterality Date   CARDIAC CATHETERIZATION  feb, 2014   Dr Irish Lack   FOOT SURGERY  10/2019   ORIF ANKLE FRACTURE Left 07/10/2013   Procedure: OPEN REDUCTION INTERNAL FIXATION (ORIF) LEFT BIMALLEOLAR ANKLE FRACTURE;  Surgeon: Mcarthur Rossetti, MD;  [facility];  Service: Orthopedics;  Laterality: Left;    There were no vitals filed for this visit.   Subjective Assessment - 06/14/21 1529     Subjective My shoulders and low back are still hurting; Dr.  Georgina Snell wants me to wear braces because of numbness in my hands. He wants me to take them off at times, but if I do my hands both go numb so I've just been keeping them on all the time. It does concern me that its both wrists at the same time.    Patient Stated Goals Improve pain    Currently in Pain? Yes    Pain Score 7    low back 7/10   Pain Location Back    Pain Orientation Right;Lower   starts on R side then radiates to the left   Pain Descriptors / Indicators Sharp;Discomfort   like someone is digging their elbow into my back   Pain Type Acute pain    Pain Radiating Towards radiates over to the left side    Pain Onset More than a month ago    Pain Frequency Intermittent    Aggravating Factors  increased standing    Pain Relieving Factors sitting                [facility] PT Assessment - 06/14/21 0001       Observation/Other Assessments   Observations ROOS test (-)  [facility] Adult PT Treatment/Exercise - 06/14/21 0001       Lumbar Exercises: Stretches   Single Knee to Chest Stretch Right;Left;5 reps;10 seconds    Lower Trunk Rotation 5 reps;10 seconds    Other Lumbar Stretch Exercise QL stretch 3 directions twice    Other Lumbar Stretch Exercise seated lx rotation stretch 2x30 B      Lumbar Exercises: Supine   Other Supine Lumbar Exercises figure 4s 1x30 seconds B    Other Supine Lumbar Exercises posterior pelvic tilt with TA set 1x10 5 second holds      Manual Therapy   Manual Therapy Soft tissue mobilization    Soft tissue mobilization IASTM R QL area ball assisted                     PT Education - 06/14/21 1747     Education Details exercise form and purpose, HEP updates- stop doing band exercises and replace with exercises given today    Person(s) Educated Patient    Methods Explanation    Comprehension Verbalized understanding;Returned demonstration              PT Short Term Goals - 05/23/21 1242        PT SHORT TERM GOAL #1   Title independent with initial HEP    Status Achieved      PT SHORT TERM GOAL #2   Title She will report 10-20% inmprovement in pain with use of RT arm    Baseline varies, increases with work activies    Status On-going      PT SHORT TERM GOAL #3   Title RT shoulder flexion will improve to equal LT shoulder    Status Achieved               PT Long Term Goals - 05/31/21 1555       PT LONG TERM GOAL #1   Status Partially Met                   Plan - 06/14/21 1748     Clinical Impression [place] arrives with some increased pain- very point area in her R low back around QL area which can radiate all the way over to the left side. Found a couple of trigger points in R low back and addressed with IASTM, otherwise worked to identify stretches and activities she can add to program at home to help reduce pain. Did advise her to stop using bands as this seems to be increasing spams in R QL per her description.    Personal Factors and Comorbidities Age;Time since onset of injury/illness/exacerbation;Profession;Past/Current Experience    Examination-Activity Limitations Sit;Stand    Examination-Participation Restrictions Occupation;Community Activity;Cleaning    Rehab Potential Good    PT Frequency 1x / week    PT Duration 6 weeks    PT Treatment/Interventions ADLs/Self Care Home Management;Cryotherapy;Electrical Stimulation;Iontophoresis 8m/ml Dexamethasone;Moist Heat;Traction;Gait training;Stair training;Functional mobility training;Therapeutic activities;Therapeutic exercise;Balance training;Neuromuscular re-education;Manual techniques;Orthotic Fit/Training;Dry needling;Passive range of motion;Taping    PT Next Visit Plan reassess- likley needs recert if she'd like to continue    PT Home Exercise Plan Access Code [email or web address]   Consulted and Agree with Plan of Care Patient             Patient will benefit from skilled therapeutic  intervention in order to improve the following deficits and impairments:  Increased fascial restricitons, Pain, Decreased activity tolerance, Improper body mechanics, Decreased mobility, Decreased strength, Postural dysfunction,  Impaired flexibility, Hypomobility  Visit Diagnosis: Acute left-sided low back pain without sciatica  Other abnormalities of gait and mobility  Chronic right shoulder pain  Muscle weakness (generalized)     Problem List Patient Active Problem List   Diagnosis Date Noted   Hidradenitis suppurativa 04/21/2021   Anal fissure 04/21/2021   Keratosis pilaris 04/21/2021   Trochanteric bursitis of right hip 08/01/2020   Carpal tunnel syndrome of right wrist 06/26/2018   Left knee tendonitis 05/11/2018   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 [place], [place], [address] Phone: [phone]   Fax:  [phone]  Name: Ewa Hipp MRN: [id number] Date of Birth: [birth year]

## 2021-06-21 ENCOUNTER — Other Ambulatory Visit: Payer: Self-pay

## 2021-06-21 ENCOUNTER — Ambulatory Visit: Payer: BC Managed Care – PPO | Admitting: Physical Therapy

## 2021-06-21 DIAGNOSIS — M545 Low back pain, unspecified: Secondary | ICD-10-CM | POA: Diagnosis not present

## 2021-06-21 DIAGNOSIS — M25511 Pain in right shoulder: Secondary | ICD-10-CM

## 2021-06-21 DIAGNOSIS — G8929 Other chronic pain: Secondary | ICD-10-CM

## 2021-06-21 DIAGNOSIS — R2689 Other abnormalities of gait and mobility: Secondary | ICD-10-CM

## 2021-06-21 DIAGNOSIS — M6281 Muscle weakness (generalized): Secondary | ICD-10-CM

## 2021-06-21 NOTE — Therapy (Signed)
[place] [place], [place], [address] Phone: [phone]   Fax:  [phone]  Physical Therapy Treatment  Patient Details  Name: Tammy Hogan MRN: [id number] Date of Birth: June 07, 1976 Referring Provider (PT): Gregor Hams, MD   Encounter Date: 06/21/2021   PT End of Session - 06/21/21 1557     Visit Number 6    Date for PT Re-Evaluation 06/28/21    Authorization Type [employer]    PT Start Time 1529    PT Stop Time 1600    PT Time Calculation (min) 31 min    Activity Tolerance Patient tolerated treatment well    Behavior During Therapy [facility] for tasks assessed/performed             Past Medical History:  Diagnosis Date   Anal fissure    Anemia    past hx    Anginal pain ([place])    [place], [place]   Bimalleolar fracture of left ankle 07/10/2013   CHF (congestive heart failure) ([place])    systolic heart failure 6286   Depression    GERD (gastroesophageal reflux disease)    History of cardiac cath 07/23/2012   Dr Irish Lack   History of echocardiogram    Cardiomyopathy-reduced EF 35-40% with wall m otion abnormality concerning for ischemia. Hypokinesis of the anterior and anteroseptal walls to the apex   HPV (human papilloma virus) infection    Hyperlipidemia    Hypertension    PCOS (polycystic ovarian syndrome)    pt denies   PPD positive, treated 2008   [place], [place]    Past Surgical History:  Procedure Laterality Date   CARDIAC CATHETERIZATION  feb, 2014   Dr Irish Lack   FOOT SURGERY  10/2019   ORIF ANKLE FRACTURE Left 07/10/2013   Procedure: OPEN REDUCTION INTERNAL FIXATION (ORIF) LEFT BIMALLEOLAR ANKLE FRACTURE;  Surgeon: Mcarthur Rossetti, MD;  Location: [facility];  Service: Orthopedics;  Laterality: Left;    There were no vitals filed for this visit.   Subjective Assessment - 06/21/21 1531     Subjective "Im ok" my lower back hurt, my knee hurt,    Currently in Pain? Yes     Pain Score 5     Pain Location Back    Pain Orientation Lower                               [facility] Adult PT Treatment/Exercise - 06/21/21 0001       Lumbar Exercises: Stretches   Passive Hamstring Stretch Right;Left;3 reps;10 seconds    Single Knee to Chest Stretch Left;Right;3 reps;10 seconds    Piriformis Stretch Left;Right;3 reps;10 seconds    Other Lumbar Stretch Exercise Trunk flex with pball      Lumbar Exercises: Aerobic   Nustep L5 x 5 min      Lumbar Exercises: Standing   Row Strengthening;Power tower;Both;20 reps    Row Limitations 10    Shoulder Extension Strengthening;Both;20 reps;Limitations;Theraband    Shoulder Extension Limitations 5      Knee/Hip Exercises: Seated   Sit to Sand 2 sets;10 reps;without UE support   holding yellow ball                      PT Short Term Goals - 05/23/21 1242       PT SHORT TERM GOAL #1  Title independent with initial HEP    Status Achieved      PT SHORT TERM GOAL #2   Title She will report 10-20% inmprovement in pain with use of RT arm    Baseline varies, increases with work activies    Status On-going      PT SHORT TERM GOAL #3   Title RT shoulder flexion will improve to equal LT shoulder    Status Achieved               PT Long Term Goals - 05/31/21 1555       PT LONG TERM GOAL #1   Status Partially Met                   Plan - 06/21/21 1600     Clinical Impression Statement Alvera Novel arrives to session ~ 14 minutes late. She reports pain in her lower back, that does not radiate. Continued with passive stretching and postural strengthening. increase fatigue with sit to stand interventions. Tactile cue to prevent trunk flexion needed with shoulder extension. Some tightness noted with single knee to chest stretch.    Personal Factors and Comorbidities Age;Time since onset of injury/illness/exacerbation;Profession;Past/Current Experience    Examination-Activity  Limitations Sit;Stand    Examination-Participation Restrictions Occupation;Community Activity;Cleaning    Rehab Potential Good    PT Duration 6 weeks    PT Treatment/Interventions ADLs/Self Care Home Management;Cryotherapy;Electrical Stimulation;Iontophoresis 4mg /ml Dexamethasone;Moist Heat;Traction;Gait training;Stair training;Functional mobility training;Therapeutic activities;Therapeutic exercise;Balance training;Neuromuscular re-education;Manual techniques;Orthotic Fit/Training;Dry needling;Passive range of motion;Taping    PT Next Visit Plan reassess- likley needs recert if she'd like to continue             Patient will benefit from skilled therapeutic intervention in order to improve the following deficits and impairments:  Increased fascial restricitons, Pain, Decreased activity tolerance, Improper body mechanics, Decreased mobility, Decreased strength, Postural dysfunction, Impaired flexibility, Hypomobility  Visit Diagnosis: Acute left-sided low back pain without sciatica  Other abnormalities of gait and mobility  Chronic right shoulder pain  Muscle weakness (generalized)     Problem List Patient Active Problem List   Diagnosis Date Noted   Hidradenitis suppurativa 04/21/2021   Anal fissure 04/21/2021   Keratosis pilaris 04/21/2021   Trochanteric bursitis of right hip 08/01/2020   Carpal tunnel syndrome of right wrist 06/26/2018   Left knee tendonitis 05/11/2018   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 [place], [place], [address] Phone: [phone]   Fax:  [phone]  Name: Melanee Cordial MRN: [id number] Date of Birth: 1976/03/01

## 2021-06-23 ENCOUNTER — Other Ambulatory Visit: Payer: Self-pay

## 2021-06-23 ENCOUNTER — Encounter: Payer: Self-pay | Admitting: Interventional Cardiology

## 2021-06-23 ENCOUNTER — Ambulatory Visit: Payer: BC Managed Care – PPO | Admitting: Interventional Cardiology

## 2021-06-23 VITALS — BP 132/90 | HR 90 | Ht 64.0 in | Wt 276.0 lb

## 2021-06-23 DIAGNOSIS — I428 Other cardiomyopathies: Secondary | ICD-10-CM

## 2021-06-23 DIAGNOSIS — I119 Hypertensive heart disease without heart failure: Secondary | ICD-10-CM | POA: Diagnosis not present

## 2021-06-23 DIAGNOSIS — Z6841 Body Mass Index (BMI) 40.0 and over, adult: Secondary | ICD-10-CM | POA: Diagnosis not present

## 2021-06-23 NOTE — Patient Instructions (Signed)
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
## 2021-06-24 ENCOUNTER — Other Ambulatory Visit: Payer: Self-pay | Admitting: Interventional Cardiology

## 2021-06-24 DIAGNOSIS — I10 Essential (primary) hypertension: Secondary | ICD-10-CM

## 2021-06-27 ENCOUNTER — Ambulatory Visit: Payer: BC Managed Care – PPO | Admitting: Physical Therapy

## 2021-06-29 ENCOUNTER — Encounter: Payer: Self-pay | Admitting: Family Medicine

## 2021-06-29 DIAGNOSIS — M545 Low back pain, unspecified: Secondary | ICD-10-CM

## 2021-06-30 ENCOUNTER — Other Ambulatory Visit: Payer: Self-pay

## 2021-06-30 DIAGNOSIS — M545 Low back pain, unspecified: Secondary | ICD-10-CM

## 2021-07-03 ENCOUNTER — Other Ambulatory Visit: Payer: Self-pay

## 2021-07-03 ENCOUNTER — Ambulatory Visit (INDEPENDENT_AMBULATORY_CARE_PROVIDER_SITE_OTHER): Payer: BC Managed Care – PPO

## 2021-07-03 DIAGNOSIS — M545 Low back pain, unspecified: Secondary | ICD-10-CM | POA: Diagnosis not present

## 2021-07-04 ENCOUNTER — Encounter: Payer: Self-pay | Admitting: Physical Therapy

## 2021-07-04 ENCOUNTER — Ambulatory Visit: Payer: BC Managed Care – PPO | Admitting: Physical Therapy

## 2021-07-04 DIAGNOSIS — G8929 Other chronic pain: Secondary | ICD-10-CM

## 2021-07-04 DIAGNOSIS — M545 Low back pain, unspecified: Secondary | ICD-10-CM | POA: Diagnosis not present

## 2021-07-04 DIAGNOSIS — R2689 Other abnormalities of gait and mobility: Secondary | ICD-10-CM

## 2021-07-04 DIAGNOSIS — M6281 Muscle weakness (generalized): Secondary | ICD-10-CM

## 2021-07-04 NOTE — Therapy (Signed)
[address]. [place], [place], [address] Phone: [phone]   Fax:  [phone]  Physical Therapy Treatment  Patient Details  Name: Tammy Hogan MRN: [id number] Date of Birth: 08/12/1975 Referring Provider (PT): Gregor Hams, MD   Encounter Date: 07/04/2021   PT End of Session - 07/04/21 1548     Visit Number 7    Authorization Type [employer]ign[employer]    PT Start Time 3846    PT Stop Time 6599    PT Time Calculation (min) 44 min    Activity Tolerance Patient tolerated treatment well    Behavior During Therapy [facility] for tasks assessed/performed             Past Medical History:  Diagnosis Date   Anal fissure    Anemia    past hx    Anginal pain ([place])    [place], [place]   Bimalleolar fracture of left ankle 07/10/2013   CHF (congestive heart failure) ([place])    systolic heart failure 3570   Depression    GERD (gastroesophageal reflux disease)    History of cardiac cath 07/23/2012   Dr Irish Lack   History of echocardiogram    Cardiomyopathy-reduced EF 35-40% with wall m otion abnormality concerning for ischemia. Hypokinesis of the anterior and anteroseptal walls to the apex   HPV (human papilloma virus) infection    Hyperlipidemia    Hypertension    PCOS (polycystic ovarian syndrome)    pt denies   PPD positive, treated 2008   [place], [place]    Past Surgical History:  Procedure Laterality Date   CARDIAC CATHETERIZATION  feb, 2014   Dr Irish Lack   FOOT SURGERY  10/2019   ORIF ANKLE FRACTURE Left 07/10/2013   Procedure: OPEN REDUCTION INTERNAL FIXATION (ORIF) LEFT BIMALLEOLAR ANKLE FRACTURE;  Surgeon: Mcarthur Rossetti, MD;  [facility];  Service: Orthopedics;  Laterality: Left;    There were no vitals filed for this visit.   Subjective Assessment - 07/04/21 1517     Subjective "Im feeling ok today because I haven't  been out of bed"    Currently in Pain? Yes    Pain Score 3     Pain  Location Back                [facility] PT Assessment - 07/04/21 0001       Assessment   Medical Diagnosis S29.012S (ICD-10-CM) - Strain of rhomboid muscle, sequela  M54.50 (ICD-10-CM) - Acute bilateral low back pain without sciatica    Referring Provider (PT) Gregor Hams, MD    Hand Dominance Right      Balance Screen   Has the patient fallen in the past 6 months No      Home Environment   Additional Comments house with husband. 3 steps to get in without HR - gets hip pain. 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    Leisure walk/play with dog      AROM   Overall AROM Comments WFL    AROM Assessment Site Cervical;Thoracic      Strength   Overall Strength Comments R shoulder grossly 4/5; L shoulder 5/5; R hip flexion 4/5, bilat hip ext and abd 4/5  [facility] Adult PT Treatment/Exercise - 07/04/21 0001       Lumbar Exercises: Aerobic   Nustep L5 x 6 min      Lumbar Exercises: Machines for Strengthening   Other Lumbar Machine Exercise Rows & Lats 25lb 2x10      Lumbar Exercises: Standing   Shoulder Extension Strengthening;Both;20 reps;Limitations;Theraband    Shoulder Extension Limitations 10      Lumbar Exercises: Seated   Sit to Stand 10 reps   OHP yellow ball x2     Knee/Hip Exercises: Machines for Strengthening   Cybex Knee Extension 5lb 2x10    Cybex Knee Flexion 25lb 2x10                       PT Short Term Goals - 07/04/21 1548       PT SHORT TERM GOAL #1   Title independent with initial HEP    Status Achieved      PT SHORT TERM GOAL #2   Title She will report 10-20% inmprovement in pain with use of RT arm    Status Partially Met      PT SHORT TERM GOAL #3   Title RT shoulder flexion will improve to equal LT shoulder    Status  Achieved               PT Long Term Goals - 07/04/21 1527       PT LONG TERM GOAL #1   Title Independent with advanced HEP    Status Achieved      PT LONG TERM GOAL #2   Title Pt will report 75% decrease in back and midthoracic pain    Status Partially Met      PT LONG TERM GOAL #3   Title BUE and LE strength grossly 4+/5 with </= 2/10 pain    Status Partially Met      PT LONG TERM GOAL #4   Title Pt will have L=R cervical and lumbar ROM    Status Achieved                   Plan - 07/04/21 1549     Clinical Impression Statement Pt has progressed increasing her lumbar ROM and LE strength, but still continues to reports low back pain that increases with prolong standing. Postural cues needed with seated rows and lasts to prevent trunk swaying. Hamstring and quad burning reported with seated leg curls and extensions.    Personal Factors and Comorbidities Age;Time since onset of injury/illness/exacerbation;Profession;Past/Current Experience    Examination-Activity Limitations Sit;Stand    Examination-Participation Restrictions Occupation;Community Activity;Cleaning    Rehab Potential Good    PT Frequency 1x / week    PT Duration 4 weeks    PT Treatment/Interventions ADLs/Self Care Home Management;Cryotherapy;Electrical Stimulation;Iontophoresis 4mg /ml Dexamethasone;Moist Heat;Traction;Gait training;Stair training;Functional mobility training;Therapeutic activities;Therapeutic exercise;Balance training;Neuromuscular re-education;Manual techniques;Orthotic Fit/Training;Dry needling;Passive range of motion;Taping    PT Next Visit Plan core strength             Patient will benefit from skilled therapeutic intervention in order to improve the following deficits and impairments:  Increased fascial restricitons, Pain, Decreased activity tolerance, Improper body mechanics, Decreased mobility, Decreased strength, Postural dysfunction, Impaired flexibility,  Hypomobility  Visit Diagnosis: Acute left-sided low back pain without sciatica - Plan: PT plan of care cert/re-cert  Other abnormalities of gait and mobility - Plan: PT plan of care cert/re-cert  Muscle weakness (generalized) - Plan: PT plan of care cert/re-cert  Chronic  right shoulder pain - Plan: PT plan of care cert/re-cert     Problem List Patient Active Problem List   Diagnosis Date Noted   Hidradenitis suppurativa 04/21/2021   Anal fissure 04/21/2021   Keratosis pilaris 04/21/2021   Trochanteric bursitis of right hip 08/01/2020   Carpal tunnel syndrome of right wrist 06/26/2018   Left knee tendonitis 05/11/2018   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 [place], [place], [address] Phone: [phone]   Fax:  [phone]  Name: Loranda Mastel MRN: [id number] Date of Birth: [birth year]

## 2021-07-05 NOTE — Progress Notes (Signed)
MRI lumbar spine does not show pinched nerves in your back but when I looked at the pictures myself I can see facet joint arthritis that looks like it could be the source of some of your back pain.  This is potentially treatable with back injections.  Please return to clinic to go the results of full detail.

## 2021-07-10 ENCOUNTER — Ambulatory Visit: Payer: Self-pay

## 2021-07-10 ENCOUNTER — Ambulatory Visit: Payer: BC Managed Care – PPO | Admitting: Family Medicine

## 2021-07-10 ENCOUNTER — Other Ambulatory Visit: Payer: Self-pay

## 2021-07-10 VITALS — BP 152/98 | HR 104 | Ht 64.0 in | Wt 274.0 lb

## 2021-07-10 DIAGNOSIS — M25562 Pain in left knee: Secondary | ICD-10-CM

## 2021-07-10 DIAGNOSIS — M545 Low back pain, unspecified: Secondary | ICD-10-CM | POA: Diagnosis not present

## 2021-07-10 NOTE — Patient Instructions (Addendum)
Thank you for coming in today.   You received a steroid injection in your left knee today. Seek immediate medical attention if the joint becomes red, extremely painful, or is oozing fluid.   Send me a message with the info about the allergic reaction you had.   Recheck back as needed
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
## 2021-07-11 ENCOUNTER — Encounter: Payer: Self-pay | Admitting: Family Medicine

## 2021-07-11 ENCOUNTER — Ambulatory Visit: Payer: BC Managed Care – PPO | Admitting: Physical Therapy

## 2021-07-17 ENCOUNTER — Ambulatory Visit
Admission: RE | Admit: 2021-07-17 | Discharge: 2021-07-17 | Disposition: A | Discharge status: Home / Self Care | Payer: BC Managed Care – PPO | Source: Ambulatory Visit | Attending: Family Medicine | Admitting: Family Medicine

## 2021-07-17 ENCOUNTER — Other Ambulatory Visit: Payer: Self-pay

## 2021-07-17 DIAGNOSIS — M545 Low back pain, unspecified: Secondary | ICD-10-CM

## 2021-07-17 DIAGNOSIS — G8929 Other chronic pain: Secondary | ICD-10-CM

## 2021-07-17 MED ORDER — IOPAMIDOL (ISOVUE-M 200) INJECTION 41%
1.0000 mL | Freq: Once | INTRAMUSCULAR | Status: AC
  Administered 2021-07-17: 1 mL via INTRA_ARTICULAR

## 2021-07-17 MED ORDER — TRIAMCINOLONE ACETONIDE 40 MG/ML IJ SUSP (RADIOLOGY)
60.0000 mg | Freq: Once | INTRAMUSCULAR | Status: AC
  Administered 2021-07-17: 60 mg via INTRA_ARTICULAR

## 2021-07-17 NOTE — Discharge Instructions (Signed)

## 2021-07-18 ENCOUNTER — Ambulatory Visit: Payer: BC Managed Care – PPO | Admitting: Physical Therapy

## 2021-07-21 ENCOUNTER — Other Ambulatory Visit: Payer: Self-pay

## 2021-07-24 ENCOUNTER — Other Ambulatory Visit: Payer: Self-pay

## 2021-07-24 ENCOUNTER — Ambulatory Visit (INDEPENDENT_AMBULATORY_CARE_PROVIDER_SITE_OTHER): Payer: BC Managed Care – PPO | Admitting: Family Medicine

## 2021-07-24 VITALS — BP 140/86 | HR 88 | Temp 97.4°F | Ht 64.0 in | Wt 267.0 lb

## 2021-07-24 DIAGNOSIS — K219 Gastro-esophageal reflux disease without esophagitis: Secondary | ICD-10-CM | POA: Diagnosis not present

## 2021-07-24 DIAGNOSIS — G5603 Carpal tunnel syndrome, bilateral upper limbs: Secondary | ICD-10-CM

## 2021-07-24 DIAGNOSIS — F411 Generalized anxiety disorder: Secondary | ICD-10-CM

## 2021-07-24 DIAGNOSIS — I10 Essential (primary) hypertension: Secondary | ICD-10-CM | POA: Diagnosis not present

## 2021-07-24 DIAGNOSIS — Z6841 Body Mass Index (BMI) 40.0 and over, adult: Secondary | ICD-10-CM

## 2021-07-24 DIAGNOSIS — I5022 Chronic systolic (congestive) heart failure: Secondary | ICD-10-CM

## 2021-07-24 MED ORDER — GABAPENTIN 300 MG PO CAPS: 300.0000 mg | ORAL_CAPSULE | Freq: Every day | ORAL | 3 refills | Status: DC

## 2021-07-24 MED ORDER — BUSPIRONE HCL 5 MG PO TABS: 5.0000 mg | ORAL_TABLET | Freq: Every day | ORAL | 0 refills | Status: DC | PRN

## 2021-07-24 MED ORDER — PANTOPRAZOLE SODIUM 40 MG PO TBEC: 40.0000 mg | DELAYED_RELEASE_TABLET | Freq: Every day | ORAL | 3 refills | Status: DC

## 2021-07-24 MED ORDER — CLONAZEPAM 0.5 MG PO TABS: ORAL_TABLET | ORAL | 1 refills | Status: DC

## 2021-07-24 MED ORDER — BUPROPION HCL ER (XL) 300 MG PO TB24: 300.0000 mg | ORAL_TABLET | Freq: Every day | ORAL | 3 refills | Status: DC
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
## 2021-07-26 ENCOUNTER — Encounter: Payer: Self-pay | Admitting: Family Medicine

## 2021-07-26 DIAGNOSIS — Z6841 Body Mass Index (BMI) 40.0 and over, adult: Secondary | ICD-10-CM

## 2021-07-26 MED ORDER — WEGOVY 0.25 MG/0.5ML ~~LOC~~ SOAJ: 0.2500 mg | SUBCUTANEOUS | 0 refills | Status: AC

## 2021-07-26 MED ORDER — WEGOVY 0.5 MG/0.5ML ~~LOC~~ SOAJ: 0.5000 mg | SUBCUTANEOUS | 0 refills | Status: AC

## 2021-07-27 ENCOUNTER — Telehealth: Payer: Self-pay

## 2021-07-27 NOTE — Telephone Encounter (Signed)
PA approved from 07/27/21 - 02/24/22.  Pharmacy notified [facility] phone. Dm/cma

## 2021-07-27 NOTE — Telephone Encounter (Signed)
PA for [facility] submitted though cover my meds to [employer].  Awaiting response.  Dm/cma   Key: [id number]

## 2021-07-28 ENCOUNTER — Ambulatory Visit: Payer: BC Managed Care – PPO | Admitting: Family Medicine

## 2021-08-07 ENCOUNTER — Ambulatory Visit: Payer: BC Managed Care – PPO | Admitting: Family Medicine

## 2021-08-07 ENCOUNTER — Other Ambulatory Visit: Payer: Self-pay

## 2021-08-07 VITALS — BP 140/86 | HR 103 | Ht 64.0 in | Wt 262.0 lb

## 2021-08-07 DIAGNOSIS — G8929 Other chronic pain: Secondary | ICD-10-CM

## 2021-08-07 DIAGNOSIS — M25562 Pain in left knee: Secondary | ICD-10-CM

## 2021-08-07 DIAGNOSIS — M545 Low back pain, unspecified: Secondary | ICD-10-CM
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
## 2021-08-07 NOTE — Patient Instructions (Addendum)
Thank you for coming in today.   Let me know if the back pain returns. We can do the block and ablation next if needed.

## 2021-09-05 ENCOUNTER — Encounter: Payer: Self-pay | Admitting: Family Medicine

## 2021-09-05 DIAGNOSIS — G8929 Other chronic pain: Secondary | ICD-10-CM

## 2021-09-07 ENCOUNTER — Other Ambulatory Visit: Payer: Self-pay | Admitting: Family Medicine

## 2021-09-07 DIAGNOSIS — G8929 Other chronic pain: Secondary | ICD-10-CM

## 2021-09-13 ENCOUNTER — Encounter: Payer: Self-pay | Admitting: Family Medicine

## 2021-09-13 ENCOUNTER — Other Ambulatory Visit: Payer: Self-pay | Admitting: Family Medicine

## 2021-09-13 DIAGNOSIS — F411 Generalized anxiety disorder: Secondary | ICD-10-CM

## 2021-09-13 MED ORDER — BUSPIRONE HCL 5 MG PO TABS: 5.0000 mg | ORAL_TABLET | Freq: Every day | ORAL | 3 refills | Status: DC | PRN

## 2021-09-13 MED ORDER — WEGOVY 1 MG/0.5ML ~~LOC~~ SOAJ: 1.0000 mg | SUBCUTANEOUS | 0 refills | Status: DC

## 2021-09-13 NOTE — Telephone Encounter (Signed)
Refill request for  ?Buspar 5 mg ?LR 07/24/21, #10, 0 rf ?LOV 07/24/21 ?FOV 10/23/21 ? ?Please review and advise.  ?Thanks. Dm/cma ?  ?

## 2021-09-20 ENCOUNTER — Telehealth: Payer: BC Managed Care – PPO | Admitting: Physician Assistant

## 2021-09-20 DIAGNOSIS — J208 Acute bronchitis due to other specified organisms: Secondary | ICD-10-CM

## 2021-09-20 MED ORDER — BENZONATATE 100 MG PO CAPS
100.0000 mg | ORAL_CAPSULE | Freq: Three times a day (TID) | ORAL | 0 refills | Status: DC | PRN
Start: 2021-09-20 — End: 2021-10-02

## 2021-09-20 MED ORDER — PREDNISONE 10 MG (21) PO TBPK: ORAL_TABLET | ORAL | 0 refills | Status: DC

## 2021-09-20 NOTE — Progress Notes (Signed)
We are sorry that you are not feeling well.  Here is how we plan to help! ? ?Based on your presentation I believe you most likely have A cough due to a virus.  This is called viral bronchitis and is best treated by rest, plenty of fluids and control of the cough.  You may use Ibuprofen or Tylenol as directed to help your symptoms.   ?  ?In addition you may use A non-prescription cough medication called Mucinex DM: take 2 tablets every 12 hours. and A prescription cough medication called Tessalon Perles 100mg. You may take 1-2 capsules every 8 hours as needed for your cough. ? ?Prednisone 10 mg daily for 6 days (see taper instructions below) ? ?Directions for 6 day taper: ?Day 1: 2 tablets before breakfast, 1 after both lunch & dinner and 2 at bedtime ?Day 2: 1 tab before breakfast, 1 after both lunch & dinner and 2 at bedtime ?Day 3: 1 tab at each meal & 1 at bedtime ?Day 4: 1 tab at breakfast, 1 at lunch, 1 at bedtime ?Day 5: 1 tab at breakfast & 1 tab at bedtime ?Day 6: 1 tab at breakfast ? ?From your responses in the eVisit questionnaire you describe inflammation in the upper respiratory tract which is causing a significant cough.  This is commonly called Bronchitis and has four common causes:   ?Allergies ?Viral Infections ?Acid Reflux ?Bacterial Infection ?Allergies, viruses and acid reflux are treated by controlling symptoms or eliminating the cause. An example might be a cough caused by taking certain blood pressure medications. You stop the cough by changing the medication. Another example might be a cough caused by acid reflux. Controlling the reflux helps control the cough. ? ?USE OF BRONCHODILATOR ("RESCUE") INHALERS: ?There is a risk from using your bronchodilator too frequently.  The risk is that over-reliance on a medication which only relaxes the muscles surrounding the breathing tubes can reduce the effectiveness of medications prescribed to reduce swelling and congestion of the tubes themselves.   Although you feel brief relief from the bronchodilator inhaler, your asthma may actually be worsening with the tubes becoming more swollen and filled with mucus.  This can delay other crucial treatments, such as oral steroid medications. If you need to use a bronchodilator inhaler daily, several times per day, you should discuss this with your provider.  There are probably better treatments that could be used to keep your asthma under control.  ?   ?HOME CARE ?Only take medications as instructed by your medical team. ?Complete the entire course of an antibiotic. ?Drink plenty of fluids and get plenty of rest. ?Avoid close contacts especially the very young and the elderly ?Cover your mouth if you cough or cough into your sleeve. ?Always remember to wash your hands ?A steam or ultrasonic humidifier can help congestion.  ? ?GET HELP RIGHT AWAY IF: ?You develop worsening fever. ?You become short of breath ?You cough up blood. ?Your symptoms persist after you have completed your treatment plan ?MAKE SURE YOU  ?Understand these instructions. ?Will watch your condition. ?Will get help right away if you are not doing well or get worse. ?  ? ?Thank you for choosing an e-visit. ? ?Your e-visit answers were reviewed by a board certified advanced clinical practitioner to complete your personal care plan. Depending upon the condition, your plan could have included both over the counter or prescription medications. ? ?Please review your pharmacy choice. Make sure the pharmacy is open so you can   pick up prescription now. If there is a problem, you may contact your provider through MyChart messaging and have the prescription routed to another pharmacy.  Your safety is important to us. If you have drug allergies check your prescription carefully.  ? ?For the next 24 hours you can use MyChart to ask questions about today's visit, request a non-urgent call back, or ask for a work or school excuse. ?You will get an email in the next  two days asking about your experience. I hope that your e-visit has been valuable and will speed your recovery. ? ?I provided 5 minutes of non face-to-face time during this encounter for chart review and documentation.  ? ?

## 2021-09-21 ENCOUNTER — Other Ambulatory Visit: Payer: Self-pay | Admitting: Family Medicine

## 2021-09-21 ENCOUNTER — Ambulatory Visit
Admission: RE | Admit: 2021-09-21 | Discharge: 2021-09-21 | Disposition: A | Discharge status: Home / Self Care | Payer: BC Managed Care – PPO | Source: Ambulatory Visit | Attending: Family Medicine | Admitting: Family Medicine

## 2021-09-21 ENCOUNTER — Encounter: Payer: Self-pay | Admitting: Family Medicine

## 2021-09-21 DIAGNOSIS — M545 Low back pain, unspecified: Secondary | ICD-10-CM

## 2021-09-21 NOTE — Discharge Instructions (Signed)
Medial Branch Block Discharge Instructions ? ?Take over-the-counter and prescription medicines only as told by your health care provider. ? ?Do not drive the day of your procedure ? ?Return to your normal activities as told by your health care provider.  ? ?If injection site is sore you may ice the area for 20 minutes, 2-3 times a day.  ? ?Check your injection site every day for signs of infection. Check for: ?Redness, swelling, or pain. ?Fluid or blood. ?Warmth. ?Pus or a bad smell. ? ?Please contact our office at [phone] if: ?You have a fever or chills. ?You have any signs of infection. ?You develop any numbness or weakness. ? ? ?Thank you for visiting our office.   ? ?YOU MAY RESUME YOUR ASPIRIN ANYTIME AFTER PROCEDURE TODAY  ?

## 2021-09-26 ENCOUNTER — Inpatient Hospital Stay: Admission: RE | Admit: 2021-09-26 | Payer: BC Managed Care – PPO | Source: Ambulatory Visit

## 2021-10-02 ENCOUNTER — Ambulatory Visit: Payer: BC Managed Care – PPO | Admitting: Family Medicine

## 2021-10-02 DIAGNOSIS — I10 Essential (primary) hypertension: Secondary | ICD-10-CM

## 2021-10-02 DIAGNOSIS — K602 Anal fissure, unspecified: Secondary | ICD-10-CM | POA: Diagnosis not present

## 2021-10-02 DIAGNOSIS — K5904 Chronic idiopathic constipation: Secondary | ICD-10-CM

## 2021-10-02 DIAGNOSIS — I5022 Chronic systolic (congestive) heart failure: Secondary | ICD-10-CM

## 2021-10-02 DIAGNOSIS — Z6841 Body Mass Index (BMI) 40.0 and over, adult: Secondary | ICD-10-CM

## 2021-10-02 MED ORDER — AMLODIPINE BESYLATE 10 MG PO TABS: 10.0000 mg | ORAL_TABLET | Freq: Every day | ORAL | 3 refills | Status: DC

## 2021-10-02 NOTE — Progress Notes (Signed)
?[facility] ?[facility] ?[place] ?[place] [address] ?Dept: [phone] ?Dept Fax: [phone] ? ?Chronic Care Office Visit ? ?Subjective:  ? ? Patient ID: Tammy Hogan, female    DOB: 27-Oct-1975, 46 y.o..   MRN: [id number] ? ?Chief Complaint  ?Patient presents with  ? Follow-up  ?  3 month f/u, c/o having bleeding from anus x 1 week.    ? ? ?History of Present Illness: ? ?Patient is in today for reassessment of chronic medical issues. ? ?Tammy Hogan has a history of cardiomyopathy with systolic heart failure, hypertension and hyperlipidemia. She is managed on carvedilol, furosemide, lisinopril, and a daily aspirin. She is on spironolactone, but for another condition. She is also on atorvastatin for lipid management. She had been on amlodipine and is not sure why she is not still taking this. ?   ?Tammy Hogan has a history of obesity. She is currently on Wegovy and is taking the 1 mg weekly dose. She gets some nausea the day of her injection, but is otherwise tolerating this. ? ?Tammy Hogan notes she has a history of an anal fissure. Recently, she had a two week period where she did not have a bowel movement. Last week, she had 2 days where she had bleeding that she feels may have been her fissure. She ended up taking Dulcolax and had a large bowel movement. Her bleeding has since stopped. ? ?Past Medical History: ?Patient Active Problem List  ? Diagnosis Date Noted  ? Hidradenitis suppurativa 04/21/2021  ? Anal fissure 04/21/2021  ? Keratosis pilaris 04/21/2021  ? Trochanteric bursitis of right hip 08/01/2020  ? Carpal tunnel syndrome of right wrist 06/26/2018  ? Left knee tendonitis 05/11/2018  ? 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 Gastroesophageal reflux disease 06/20/2016  ? Mixed hyperlipidemia 05/30/2016  ? Cervicalgia 05/21/2016  ? Generalized anxiety disorder 05/15/2016  ? Arthralgia of multiple joints 05/15/2016  ? BMI 45.0-49.9, adult  ([place]) 05/15/2016  ? Cardiomyopathy ([place]) 07/02/2012  ? Systolic heart failure ([place]) 07/02/2012  ? HTN (hypertension), malignant 07/01/2012  ? Tobacco use disorder 07/01/2012  ? ?Past Surgical History:  ?Procedure Laterality Date  ? CARDIAC CATHETERIZATION  feb, 2014  ? Dr Irish Lack  ? FOOT SURGERY  10/2019  ? ORIF ANKLE FRACTURE Left 07/10/2013  ? Procedure: OPEN REDUCTION INTERNAL FIXATION (ORIF) LEFT BIMALLEOLAR ANKLE FRACTURE;  Surgeon: Mcarthur Rossetti, MD;  Location: [facility];  Service: Orthopedics;  Laterality: Left;  ? ?Family History  ?Problem Relation Age of Onset  ? Hypertension Mother   ? Hyperlipidemia Mother   ? Colon polyps Mother   ? Other Father   ?     drug overdose  ? Drug abuse Father   ?     overdose  ? Hypertension Sister   ? Diabetes Maternal Uncle   ? Diabetes Maternal Grandmother   ? Cancer Maternal Grandmother   ? Heart failure Paternal Grandmother   ? Hypertension Paternal Grandmother   ? Diabetes Paternal Grandmother   ? Glaucoma Paternal Grandmother   ? Cirrhosis Paternal Grandfather   ? Alcohol abuse Paternal Grandfather   ? Cancer Other 78  ?     breast, cervical and colon cancer  ? Colon cancer Neg Hx   ? Esophageal cancer Neg Hx   ? Rectal cancer Neg Hx   ? Stomach cancer Neg Hx   ? ?Outpatient Medications Prior to Visit  ?Medication Sig Dispense Refill  ? AMBULATORY NON FORMULARY MEDICATION  Medication Name: Diltiazem ointment 2%- apply to anal sphincter 5 times a day as instructed per Dr. Henrene Pastor 30 g 3  ? aspirin 81 MG tablet Take 81 mg by mouth daily.    ? atorvastatin (LIPITOR) 10 MG tablet TAKE ONE TABLET BY MOUTH DAILY 90 tablet 3  ? buPROPion (WELLBUTRIN XL) 300 MG 24 hr tablet Take 1 tablet (300 mg total) by mouth daily. 90 tablet 3  ? busPIRone (BUSPAR) 5 MG tablet Take 1 tablet (5 mg total) by mouth daily as needed. Take 1 hour prior to testing. 20 tablet 3  ? carvedilol (COREG) 25 MG tablet TAKE ONE TABLET BY MOUTH TWICE A DAY 180 tablet 3  ? clonazePAM (KLONOPIN) 0.5 MG  tablet Talk half tab daily as needed 30 tablet 1  ? finasteride (PROSCAR) 5 MG tablet Take 1 mg by mouth daily.    ? furosemide (LASIX) 20 MG tablet TAKE ONE TABLET BY MOUTH DAILY 90 tablet 3  ? gabapentin (NEURONTIN) 300 MG capsule Take 1 capsule (300 mg total) by mouth at bedtime. 90 capsule 3  ? ibuprofen (ADVIL) 600 MG tablet Take 1 tablet (600 mg total) by mouth every 6 (six) hours as needed. 30 tablet 0  ? levonorgestrel (MIRENA) 20 MCG/24HR IUD 1 each by Intrauterine route once.    ? lisinopril (ZESTRIL) 40 MG tablet TAKE ONE TABLET BY MOUTH DAILY 90 tablet 3  ? nitroGLYCERIN (NITROSTAT) 0.4 MG SL tablet Place 1 tablet (0.4 mg total) under the tongue every 5 (five) minutes as needed for chest pain. 25 tablet 3  ? pantoprazole (PROTONIX) 40 MG tablet Take 1 tablet (40 mg total) by mouth daily. 90 tablet 3  ? Semaglutide-Weight Management (WEGOVY) 1 MG/0.5ML SOAJ Inject 1 mg into the skin once a week. 2 mL 0  ? spironolactone (ALDACTONE) 50 MG tablet Take 50 mg by mouth daily.    ? tiZANidine (ZANAFLEX) 4 MG tablet Take 1 tablet (4 mg total) by mouth every 8 (eight) hours as needed for muscle spasms. 60 tablet 2  ? amLODipine (NORVASC) 10 MG tablet Take 1 tablet (10 mg total) by mouth daily. 90 tablet 3  ? benzonatate (TESSALON) 100 MG capsule Take 1 capsule (100 mg total) by mouth 3 (three) times daily as needed. 30 capsule 0  ? predniSONE (STERAPRED UNI-PAK 21 TAB) 10 MG (21) TBPK tablet 6 day taper; take as directed on package instructions 21 tablet 0  ? ?No facility-administered medications prior to visit.  ? ?Allergies  ?Allergen Reactions  ? Percocet [Oxycodone-Acetaminophen] Nausea Only  ?   ?Objective:  ? ?Today's Vitals  ? 10/02/21 0837  ?BP: 134/86  ?Pulse: 66  ?Temp: 97.6 ?F (36.4 ?C)  ?TempSrc: Temporal  ?SpO2: 95%  ?Weight: 258 lb 12.8 oz (117.4 kg)  ?Height: '5\' 4"'$  (1.626 m)  ? ?Body mass index is 44.42 kg/m?.  ? ?General: Well developed, well nourished. No acute distress. ?Psych: Alert and  oriented. Normal mood and affect. ? ?There are no preventive care reminders to display for this patient. ?   ?Assessment & Plan:  ? ?1. Morbid obesity with BMI of 40.0-44.9, adult ([place]) ?Tammy Hogan is currently on Wegovy 1 mg injection weekly. We will step up tot he 1.7 mg dose at her next fill. Her maximum weight was 276 lb. Currently is down 18 lbs. I will see her back in 3 months to assess. ? ?2. HTN (hypertension), malignant ?Blood pressure is above goal today. I recommend we restart her amlodipine. ? ?-  amLODipine (NORVASC) 10 MG tablet; Take 1 tablet (10 mg total) by mouth daily.  Dispense: 90 tablet; Refill: 3 ? ?3. Chronic systolic heart failure ([place]) ?Compensated. Cotninue current meds. ? ?4. Anal fissure ?The recent bleeding was likley due to constipation having exacerbated her anal fissure. Currently, there is no bleeding. ? ?5. Chronic idiopathic constipation ?We discussed management of chronic constipation. She will increased water intake. I provided information on options to increase fiber. ? ?Return in about 3 months (around 01/01/2022) for Reassessment.  ? ?Haydee Salter, MD ?

## 2021-10-02 NOTE — Patient Instructions (Signed)
Constipation: Our goal is to achieve formed bowel movements daily or every-other-day.  You may need to try different combinations of the following options to find what works best for you - everybody's body works differently so feel free to adjust the dosages as needed.  Some options to help maintain bowel health include:  Dietary changes (more leafy greens, vegetables and fruits; less processed foods) Fiber supplementation (Benefiber, FiberCon, Metamucil or Psyllium). Start slow and increase gradually to full dose. Over-the-counter agents such as: stool softeners (Docusate or Colace) and/or laxatives (Miralax, milk of magnesia)  "Power Pudding" is a natural mixture that may help your constipation.  To make blend 1 cup applesauce, 1 cup wheat bran, and 3/4 cup prune juice, refrigerate and then take 1 tablespoon daily with a large glass of water as needed.  

## 2021-10-09 ENCOUNTER — Ambulatory Visit
Admission: RE | Admit: 2021-10-09 | Discharge: 2021-10-09 | Disposition: A | Discharge status: Home / Self Care | Payer: BC Managed Care – PPO | Source: Ambulatory Visit | Attending: Emergency Medicine | Admitting: Emergency Medicine

## 2021-10-09 ENCOUNTER — Inpatient Hospital Stay: Admission: RE | Admit: 2021-10-09 | Payer: BC Managed Care – PPO | Source: Ambulatory Visit

## 2021-10-09 ENCOUNTER — Other Ambulatory Visit: Payer: Self-pay

## 2021-10-09 VITALS — BP 137/80 | HR 97 | Temp 98.2°F | Resp 16

## 2021-10-09 DIAGNOSIS — J36 Peritonsillar abscess: Secondary | ICD-10-CM

## 2021-10-09 DIAGNOSIS — J029 Acute pharyngitis, unspecified: Secondary | ICD-10-CM

## 2021-10-09 LAB — POCT RAPID STREP A (OFFICE): Rapid Strep A Screen: NEGATIVE

## 2021-10-09 MED ORDER — FLUCONAZOLE 150 MG PO TABS
ORAL_TABLET | ORAL | 0 refills | Status: DC
Start: 2021-10-09 — End: 2022-11-20

## 2021-10-09 MED ORDER — CEFDINIR 300 MG PO CAPS: 300.0000 mg | ORAL_CAPSULE | Freq: Two times a day (BID) | ORAL | 0 refills | Status: AC

## 2021-10-09 MED ORDER — IBUPROFEN 400 MG PO TABS: 400.0000 mg | ORAL_TABLET | Freq: Three times a day (TID) | ORAL | 0 refills | Status: DC | PRN

## 2021-10-09 NOTE — ED Provider Notes (Signed)
?[facility] ? ? ? ?CSN: [id number] ?Arrival date & time: 10/09/21  1033 ?  ? ?HISTORY  ? ?Chief Complaint  ?Patient presents with  ? Sore Throat  ? APPT 1030  ? ?HPI ?Tammy Hogan is a 46 y.o. female. Patient reports a 1 day history of sore throat.  Patient took a 5-day course of prednisone about 2 weeks ago.  Patient states is painful when she swallows.  Patient states she does not work around small children, does not know that she has been exposed to strep, denies fever, headache, nausea, vomiting, diarrhea.  Rapid strep test today is negative. ? ?The history is provided by the patient.  ?Past Medical History:  ?Diagnosis Date  ? Anal fissure   ? Anemia   ? past hx   ? Anginal pain ([place])   ? Anxiety   ? Brunetta Jeans, [place]  ? Bimalleolar fracture of left ankle 07/10/2013  ? CHF (congestive heart failure) ([place])   ? systolic heart failure 4098  ? Depression   ? GERD (gastroesophageal reflux disease)   ? History of cardiac cath 07/23/2012  ? Dr Irish Lack  ? History of echocardiogram   ? Cardiomyopathy-reduced EF 35-40% with wall m otion abnormality concerning for ischemia. Hypokinesis of the anterior and anteroseptal walls to the apex  ? HPV (human papilloma virus) infection   ? Hyperlipidemia   ? Hypertension   ? PCOS (polycystic ovarian syndrome)   ? pt denies  ? PPD positive, treated 2008  ? Brunetta Jeans, [place]  ? ?Patient Active Problem List  ? Diagnosis Date Noted  ? Hidradenitis suppurativa 04/21/2021  ? Anal fissure 04/21/2021  ? Keratosis pilaris 04/21/2021  ? Trochanteric bursitis of right hip 08/01/2020  ? Carpal tunnel syndrome of right wrist 06/26/2018  ? Left knee tendonitis 05/11/2018  ? 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 Gastroesophageal reflux disease 06/20/2016  ? Mixed hyperlipidemia 05/30/2016  ? Cervicalgia 05/21/2016  ? Generalized anxiety disorder 05/15/2016  ? Arthralgia of multiple joints 05/15/2016  ? BMI 45.0-49.9, adult ([place]) 05/15/2016  ? Cardiomyopathy ([place])  07/02/2012  ? Systolic heart failure ([place]) 07/02/2012  ? HTN (hypertension), malignant 07/01/2012  ? Tobacco use disorder 07/01/2012  ? ?Past Surgical History:  ?Procedure Laterality Date  ? CARDIAC CATHETERIZATION  feb, 2014  ? Dr Irish Lack  ? FOOT SURGERY  10/2019  ? ORIF ANKLE FRACTURE Left 07/10/2013  ? Procedure: OPEN REDUCTION INTERNAL FIXATION (ORIF) LEFT BIMALLEOLAR ANKLE FRACTURE;  Surgeon: Mcarthur Rossetti, MD;  Location: [facility];  Service: Orthopedics;  Laterality: Left;  ? ?OB History   ?No obstetric history on file. ?  ? ?Home Medications   ? ?Prior to Admission medications   ?Medication Sig Start Date End Date Taking? Authorizing Provider  ?amLODipine (NORVASC) 10 MG tablet Take 1 tablet (10 mg total) by mouth daily. 10/02/21  Yes Haydee Salter, MD  ?aspirin 81 MG tablet Take 81 mg by mouth daily.   Yes [provider]  ?atorvastatin (LIPITOR) 10 MG tablet TAKE ONE TABLET BY MOUTH DAILY 06/26/21  Yes Jettie Booze, MD  ?buPROPion (WELLBUTRIN XL) 300 MG 24 hr tablet Take 1 tablet (300 mg total) by mouth daily. 07/24/21  Yes Haydee Salter, MD  ?busPIRone (BUSPAR) 5 MG tablet Take 1 tablet (5 mg total) by mouth daily as needed. Take 1 hour prior to testing. 09/13/21  Yes Haydee Salter, MD  ?carvedilol (COREG) 25 MG tablet TAKE ONE TABLET BY MOUTH TWICE A  DAY 01/09/21  Yes Jettie Booze, MD  ?finasteride (PROSCAR) 5 MG tablet Take 1 mg by mouth daily. 07/31/19  Yes [provider]  ?furosemide (LASIX) 20 MG tablet TAKE ONE TABLET BY MOUTH DAILY 06/26/21  Yes Jettie Booze, MD  ?gabapentin (NEURONTIN) 300 MG capsule Take 1 capsule (300 mg total) by mouth at bedtime. 07/24/21  Yes Haydee Salter, MD  ?lisinopril (ZESTRIL) 40 MG tablet TAKE ONE TABLET BY MOUTH DAILY 06/26/21  Yes Jettie Booze, MD  ?pantoprazole (PROTONIX) 40 MG tablet Take 1 tablet (40 mg total) by mouth daily. 07/24/21  Yes Haydee Salter, MD  ?Semaglutide-Weight Management [facility]) 1 MG/0.5ML  SOAJ Inject 1 mg into the skin once a week. 09/13/21  Yes Haydee Salter, MD  ?spironolactone (ALDACTONE) 50 MG tablet Take 50 mg by mouth daily.   Yes [provider]  ?AMBULATORY NON FORMULARY MEDICATION Medication Name: Diltiazem ointment 2%- apply to anal sphincter 5 times a day as instructed per Dr. Henrene Pastor 07/13/20   Irene Shipper, MD  ?clonazePAM [place] [place]) 0.5 MG tablet Talk half tab daily as needed 07/24/21   Haydee Salter, MD  ?ibuprofen (ADVIL) 600 MG tablet Take 1 tablet (600 mg total) by mouth every 6 (six) hours as needed. 04/27/21   LampteyMyrene Galas, MD  ?levonorgestrel (MIRENA) 20 MCG/24HR IUD 1 each by Intrauterine route once.    [provider]  ?nitroGLYCERIN (NITROSTAT) 0.4 MG SL tablet Place 1 tablet (0.4 mg total) under the tongue every 5 (five) minutes as needed for chest pain. 09/18/18   Jettie Booze, MD  ?tiZANidine (ZANAFLEX) 4 MG tablet Take 1 tablet (4 mg total) by mouth every 8 (eight) hours as needed for muscle spasms. 05/15/21   Gregor Hams, MD  ? ?Family History ?Family History  ?Problem Relation Age of Onset  ? Hypertension Mother   ? Hyperlipidemia Mother   ? Colon polyps Mother   ? Other Father   ?     drug overdose  ? Drug abuse Father   ?     overdose  ? Hypertension Sister   ? Diabetes Maternal Uncle   ? Diabetes Maternal Grandmother   ? Cancer Maternal Grandmother   ? Heart failure Paternal Grandmother   ? Hypertension Paternal Grandmother   ? Diabetes Paternal Grandmother   ? Glaucoma Paternal Grandmother   ? Cirrhosis Paternal Grandfather   ? Alcohol abuse Paternal Grandfather   ? Cancer Other 78  ?     breast, cervical and colon cancer  ? Colon cancer Neg Hx   ? Esophageal cancer Neg Hx   ? Rectal cancer Neg Hx   ? Stomach cancer Neg Hx   ? ?Social History ?Social History  ? ?Tobacco Use  ? Smoking status: Former  ?  Packs/day: 0.50  ?  Years: 16.00  ?  Pack years: 8.00  ?  Types: Cigarettes  ?  Quit date: 05/16/2021  ?  Years since quitting: 0.4  ?  Smokeless tobacco: Never  ?Vaping Use  ? Vaping Use: Never used  ?Substance Use Topics  ? Alcohol use: Yes  ?  Comment: socially  ? Drug use: No  ? ?Allergies   ?Percocet [oxycodone-acetaminophen] ? ?Review of Systems ?Review of Systems ?Pertinent findings noted in history of present illness.  ? ?Physical Exam ?Triage Vital Signs ?ED Triage Vitals  ?Enc Vitals Group  ?   BP 04/07/21 0827 (!) 147/82  ?   Pulse Rate  04/07/21 0827 72  ?   Resp 04/07/21 0827 18  ?   Temp 04/07/21 0827 98.3 ?F (36.8 ?C)  ?   Temp Source 04/07/21 0827 Oral  ?   SpO2 04/07/21 0827 98 %  ?   Weight --   ?   Height --   ?   Head Circumference --   ?   Peak Flow --   ?   Pain Score 04/07/21 0826 5  ?   Pain Loc --   ?   Pain Edu? --   ?   Excl. in [place]? --   ?No data found. ? ?Updated Vital Signs ?BP 137/80 (BP Location: Right Arm)   Pulse 97   Temp 98.2 ?F (36.8 ?C) (Oral)   Resp 16   LMP  (LMP Unknown)   SpO2 96%  ? ?Physical Exam ?Constitutional:   ?   General: She is not in acute distress. ?   Appearance: She is well-developed. She is ill-appearing. She is not toxic-appearing.  ?HENT:  ?   Head: Normocephalic and atraumatic.  ?   Salivary Glands: Right salivary gland is diffusely enlarged and tender. Left salivary gland is diffusely enlarged and tender.  ?   Right Ear: Hearing and external ear normal.  ?   Left Ear: Hearing and external ear normal.  ?   Ears:  ?   Comments: Bilateral EACs with mild erythema, bilateral TMs are normal ?   Nose: No mucosal edema, congestion or rhinorrhea.  ?   Right Turbinates: Not enlarged, swollen or pale.  ?   Left Turbinates: Not enlarged or swollen.  ?   Right Sinus: No maxillary sinus tenderness or frontal sinus tenderness.  ?   Left Sinus: No maxillary sinus tenderness or frontal sinus tenderness.  ?   Mouth/Throat:  ?   Lips: Pink. No lesions.  ?   Mouth: Mucous membranes are moist. No oral lesions or angioedema.  ?   Dentition: No gingival swelling.  ?   Tongue: No lesions.  ?   Palate: No mass.   ?   Pharynx: Uvula midline. Pharyngeal swelling, oropharyngeal exudate and posterior oropharyngeal erythema present. No uvula swelling.  ?   Tonsils: Tonsillar exudate present. 1+ on the right. 3+ on the left.  ?Eyes:

## 2021-10-09 NOTE — Discharge Instructions (Signed)
Please begin cefdinir for the infection in and around your left tonsil.  I provided you with a prescription for Diflucan in case you develop a vaginal yeast infection while taking antibiotics.  Please take both exactly as prescribed. ? ?If you like to try ibuprofen 400 mg 3 times daily to reduce inflammation and help ease your throat pain, please feel free to do so.  I sent a prescription for this as well. ? ?After 7 to 10 days, if you are not feeling completely better, please come back for reevaluation or follow-up with your primary care provider. ? ?Thank you for visiting urgent care today.  We appreciate the opportunity to participate in your care. ?

## 2021-10-09 NOTE — ED Triage Notes (Signed)
Patient c/o sore throat x 1 day.  ? ?Patient denies fever. Patient denies any recent exposure to strep or working around children.  ? ?Patient endorses painful swallowing.  ? ?Patient hasn't taken any medications for symptoms.  ?

## 2021-10-12 ENCOUNTER — Telehealth: Payer: Self-pay | Admitting: Family Medicine

## 2021-10-12 NOTE — Telephone Encounter (Signed)
I talked with the peer to peer scheduled today. ? ?Tammy Hogan will be approved for right L4-L5 facet ablation but not approved for right L5-S1 level ablation because we have not done the injection than the block at that level.  That to me is appropriate as I do not think that we need to attempt to ablate the L5-S1 level at this time.. ? ? ?Goal is to target the L4-L5 level on the right for ablation.  We should receive a fax soon and be able to proceed to ablation. ?

## 2021-10-14 ENCOUNTER — Encounter: Payer: Self-pay | Admitting: Family Medicine

## 2021-10-16 MED ORDER — WEGOVY 1.7 MG/0.75ML ~~LOC~~ SOAJ: 1.7000 mg | SUBCUTANEOUS | 5 refills | Status: DC

## 2021-10-16 NOTE — Progress Notes (Signed)
wegovy

## 2021-10-17 ENCOUNTER — Other Ambulatory Visit: Payer: Self-pay | Admitting: Family Medicine

## 2021-10-17 ENCOUNTER — Ambulatory Visit
Admission: RE | Admit: 2021-10-17 | Discharge: 2021-10-17 | Disposition: A | Discharge status: Home / Self Care | Payer: BC Managed Care – PPO | Source: Ambulatory Visit | Attending: Family Medicine | Admitting: Family Medicine

## 2021-10-17 DIAGNOSIS — M545 Low back pain, unspecified: Secondary | ICD-10-CM

## 2021-10-17 MED ORDER — KETOROLAC TROMETHAMINE 30 MG/ML IJ SOLN
30.0000 mg | Freq: Once | INTRAMUSCULAR | Status: AC
  Administered 2021-10-17: 30 mg via INTRAVENOUS

## 2021-10-17 MED ORDER — SODIUM CHLORIDE 0.9 % IV SOLN: INTRAVENOUS | Status: DC

## 2021-10-17 MED ORDER — MIDAZOLAM HCL 2 MG/2ML IJ SOLN
1.0000 mg | INTRAMUSCULAR | Status: DC | PRN
  Administered 2021-10-17 (×2): 1 mg via INTRAVENOUS
  Administered 2021-10-17 (×2): 0.5 mg via INTRAVENOUS

## 2021-10-17 MED ORDER — METHYLPREDNISOLONE ACETATE 40 MG/ML INJ SUSP (RADIOLOG
80.0000 mg | Freq: Once | INTRAMUSCULAR | Status: AC
  Administered 2021-10-17: 80 mg via INTRA_ARTICULAR

## 2021-10-17 MED ORDER — FENTANYL CITRATE PF 50 MCG/ML IJ SOSY
25.0000 ug | PREFILLED_SYRINGE | INTRAMUSCULAR | Status: DC | PRN
  Administered 2021-10-17: 50 ug via INTRAVENOUS
  Administered 2021-10-17 (×2): 25 ug via INTRAVENOUS

## 2021-10-17 NOTE — Discharge Instructions (Signed)
Radio Frequency Ablation Post Procedure Discharge Instructions  May resume a regular diet and any medications that you routinely take (including pain medications). No driving day of procedure. Upon discharge go home and rest for at least 4 hours.  May use an ice pack as needed to injection sites on back. Remove bandades later, today.    Please contact our office at [phone] for the following symptoms:  Fever greater than 100 degrees Increased swelling, pain, or redness at injection site.   Thank you for visiting [facility].  YOU MAY RESUME YOUR ASPIRIN ANYTIME AFTER PROCEDURE TODAY  

## 2021-10-17 NOTE — Progress Notes (Signed)
Pt back in nursing recovery area. Pt still drowsy from procedure but will wake up when spoken to. Pt follows commands, talks in complete sentences and has no complaints at this time. Pt will remain in nurses station until discharged by Radiologist.   

## 2021-10-23 ENCOUNTER — Ambulatory Visit: Payer: BC Managed Care – PPO | Admitting: Family Medicine
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
## 2021-12-21 ENCOUNTER — Ambulatory Visit: Payer: BC Managed Care – PPO | Admitting: Family Medicine

## 2021-12-24 ENCOUNTER — Other Ambulatory Visit: Payer: Self-pay | Admitting: Family Medicine

## 2021-12-24 ENCOUNTER — Other Ambulatory Visit: Payer: Self-pay | Admitting: Interventional Cardiology

## 2021-12-24 DIAGNOSIS — F411 Generalized anxiety disorder: Secondary | ICD-10-CM
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
## 2022-01-26 ENCOUNTER — Encounter: Payer: Self-pay | Admitting: Interventional Cardiology

## 2022-01-26 ENCOUNTER — Ambulatory Visit: Payer: BC Managed Care – PPO | Admitting: Family Medicine

## 2022-01-26 DIAGNOSIS — I119 Hypertensive heart disease without heart failure: Secondary | ICD-10-CM

## 2022-01-29 ENCOUNTER — Ambulatory Visit: Payer: BC Managed Care – PPO | Admitting: Family Medicine

## 2022-01-29 VITALS — BP 132/80 | HR 97 | Ht 64.0 in | Wt 257.0 lb

## 2022-01-29 DIAGNOSIS — G8929 Other chronic pain: Secondary | ICD-10-CM | POA: Diagnosis not present

## 2022-01-29 DIAGNOSIS — M545 Low back pain, unspecified: Secondary | ICD-10-CM

## 2022-01-29 DIAGNOSIS — M7061 Trochanteric bursitis, right hip: Secondary | ICD-10-CM | POA: Diagnosis not present

## 2022-01-29 NOTE — Patient Instructions (Addendum)
Thank you for coming in today.   Please complete the exercises that the athletic trainer went over with you:  View at [email or web address] using code: [email or web address]  You received an injection today. Seek immediate medical attention if the joint becomes red, extremely painful, or is oozing fluid.   Please call [facility] at [phone] to schedule your spine injection.    Check back in 1 month
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
## 2022-01-30 NOTE — Telephone Encounter (Signed)
Jettie Booze, MD to Me  Leeroy Bock, RPH-CPP      01/26/22  9:17 AM Amlodipine is fine.   Spironolactone does have benefits for your heart.  I am okay trying the minoxidil and stopping the spironolactone, but you will need close follow-up of your blood pressure.  We may need to check an echocardiogram in about 3 months to make sure there is no change in the left ventricular function.   Would have you follow-up in our Pharm.D. hypertension clinic after the change in about 1 month.

## 2022-02-01 ENCOUNTER — Ambulatory Visit
Admission: RE | Admit: 2022-02-01 | Discharge: 2022-02-01 | Disposition: A | Discharge status: Home / Self Care | Payer: BC Managed Care – PPO | Source: Ambulatory Visit | Attending: Family Medicine | Admitting: Family Medicine

## 2022-02-01 DIAGNOSIS — G8929 Other chronic pain: Secondary | ICD-10-CM

## 2022-02-01 MED ORDER — IOPAMIDOL (ISOVUE-M 200) INJECTION 41%: 1.0000 mL | Freq: Once | INTRAMUSCULAR | Status: DC

## 2022-02-01 MED ORDER — TRIAMCINOLONE ACETONIDE 40 MG/ML IJ SUSP (RADIOLOGY): 60.0000 mg | Freq: Once | INTRAMUSCULAR | Status: DC

## 2022-02-01 NOTE — Discharge Instructions (Addendum)

## 2022-02-14 ENCOUNTER — Encounter: Payer: Self-pay | Admitting: Family Medicine

## 2022-02-14 ENCOUNTER — Ambulatory Visit: Payer: BC Managed Care – PPO | Admitting: Family Medicine

## 2022-02-14 VITALS — BP 132/78 | HR 91 | Temp 96.9°F | Ht 64.0 in | Wt 251.4 lb

## 2022-02-14 DIAGNOSIS — Z6841 Body Mass Index (BMI) 40.0 and over, adult: Secondary | ICD-10-CM

## 2022-02-14 DIAGNOSIS — I10 Essential (primary) hypertension: Secondary | ICD-10-CM | POA: Diagnosis not present

## 2022-02-14 DIAGNOSIS — Z23 Encounter for immunization: Secondary | ICD-10-CM

## 2022-02-14 DIAGNOSIS — M47816 Spondylosis without myelopathy or radiculopathy, lumbar region: Secondary | ICD-10-CM | POA: Insufficient documentation

## 2022-02-14 DIAGNOSIS — L732 Hidradenitis suppurativa: Secondary | ICD-10-CM | POA: Diagnosis not present

## 2022-02-14 MED ORDER — CEPHALEXIN 500 MG PO CAPS: 500.0000 mg | ORAL_CAPSULE | Freq: Four times a day (QID) | ORAL | 0 refills | Status: AC

## 2022-02-14 NOTE — Progress Notes (Signed)
[facility] [address] [place] [address] Dept: [phone] Dept Fax: [phone]  Chronic Care Office Visit  Subjective:    Patient ID: Tammy Hogan, female    DOB: [birth year], 46 y.o..   MRN: [id number]  Chief Complaint  Patient presents with   Follow-up    F/u meds.  Down 6 lbs.   C/o having an abcess on inner thigh.  Has used warm compresses. Wants flu shot today.     History of Present Illness:  Patient is in today for reassessment of chronic medical issues.  [name] has a history of cardiomyopathy with systolic heart failure, hypertension and hyperlipidemia. She is managed on carvedilol 25 mg bid, furosemide 20 mg daily, lisinopril 40 mg daily, amlodipine 10 mg daily and a daily 81 mg aspirin. She had been on spironolactone related to hair loss, but her cardiologist did feel it benefited her CHF. This has now been stopped and she is on minoxidil. She notes cardiology plans to monitor this for now. [name] is also on atorvastatin 10 mg daily for lipid management.    [name] has a history of obesity. She is currently on Wegovy and is taking the 1.7 mg weekly dose. She is tolerating this well and pleased with her ongoing weight loss.  [name] has a history of hidradenitis suppurativa. She notes she has an on going issue with recurrent infection in her right upper thigh area. She has a current abscess that has not come to a head. she worries abotut he eventual impact of repeated courses of antibiotics.  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    Gastroesophageal reflux disease 06/20/2016   Mixed hyperlipidemia 05/30/2016   Cervicalgia 05/21/2016   Generalized anxiety disorder 05/15/2016   Arthralgia of multiple joints 05/15/2016   Morbid obesity with BMI of 40.0-44.9, adult ([place]) 05/15/2016   Essential hypertension 09/07/2014   Cardiomyopathy ([place]) 69/62/9528   Systolic heart failure ([place]) 07/02/2012   Past Surgical History:  Procedure Laterality Date   CARDIAC CATHETERIZATION  feb, 2014   Dr Irish Lack   FOOT SURGERY  10/2019   ORIF ANKLE FRACTURE Left 07/10/2013   Procedure: OPEN REDUCTION INTERNAL FIXATION (ORIF) LEFT BIMALLEOLAR ANKLE FRACTURE;  Surgeon: Mcarthur Rossetti, MD;  Location: [facility];  Service: Orthopedics;  Laterality: Left;   Family History  Problem Relation Age of Onset   Hypertension Mother    Hyperlipidemia Mother    Colon polyps Mother    Other Father        drug overdose   Drug abuse Father        overdose   Hypertension Sister    Diabetes Maternal Uncle    Diabetes Maternal Grandmother    Cancer Maternal Grandmother    Heart failure Paternal Grandmother    Hypertension Paternal Grandmother    Diabetes Paternal Grandmother    Glaucoma Paternal Grandmother    Cirrhosis Paternal Grandfather    Alcohol abuse Paternal Grandfather    Cancer Other 86       breast, cervical and colon cancer   Colon cancer Neg Hx    Esophageal cancer  Neg Hx    Rectal cancer Neg Hx    Stomach cancer Neg Hx    Outpatient Medications Prior to Visit  Medication Sig Dispense Refill   AMBULATORY NON FORMULARY MEDICATION Medication Name: Diltiazem ointment 2%- apply to anal sphincter 5 times a day as instructed per Dr. Henrene Pastor 30 g 3   amLODipine (NORVASC) 10 MG tablet Take 1 tablet (10 mg total) by mouth daily. 90 tablet 3   aspirin 81 MG tablet Take 81 mg by mouth daily.     atorvastatin (LIPITOR) 10 MG tablet TAKE ONE TABLET BY MOUTH DAILY 90 tablet 3   buPROPion (WELLBUTRIN XL) 300 MG 24 hr tablet Take 1 tablet  (300 mg total) by mouth daily. 90 tablet 3   busPIRone (BUSPAR) 5 MG tablet TAKE ONE TABLET BY MOUTH DAILY AS NEEDED  - TAKE 1 HOUR PRIOR TO TESTING 20 tablet 3   carvedilol (COREG) 25 MG tablet TAKE ONE TABLET BY MOUTH TWICE A DAY 180 tablet 3   clonazePAM (KLONOPIN) 0.5 MG tablet Talk half tab daily as needed 30 tablet 1   finasteride (PROSCAR) 5 MG tablet Take 1 mg by mouth daily.     fluconazole (DIFLUCAN) 150 MG tablet Take 1 tablet on day 4 of antibiotics.  Take second tablet 3 days later. 2 tablet 0   furosemide (LASIX) 20 MG tablet TAKE ONE TABLET BY MOUTH DAILY 90 tablet 3   gabapentin (NEURONTIN) 300 MG capsule Take 1 capsule (300 mg total) by mouth at bedtime. 90 capsule 3   ibuprofen (ADVIL) 400 MG tablet Take 1 tablet (400 mg total) by mouth every 8 (eight) hours as needed for up to 30 doses. 30 tablet 0   ibuprofen (ADVIL) 600 MG tablet Take 1 tablet (600 mg total) by mouth every 6 (six) hours as needed. 30 tablet 0   levonorgestrel (MIRENA) 20 MCG/24HR IUD 1 each by Intrauterine route once.     lisinopril (ZESTRIL) 40 MG tablet TAKE ONE TABLET BY MOUTH DAILY 90 tablet 3   minoxidil (LONITEN) 2.5 MG tablet Take by mouth.     Minoxidil (ROGAINE EX) Apply topically.     nitroGLYCERIN (NITROSTAT) 0.4 MG SL tablet Place 1 tablet (0.4 mg total) under the tongue every 5 (five) minutes as needed for chest pain. 25 tablet 3   nystatin ointment (MYCOSTATIN) APPLY TO THE AFFECTED AREA(S) BY TOPICAL ROUTE 2 TIMES PER DAY     pantoprazole (PROTONIX) 40 MG tablet Take 1 tablet (40 mg total) by mouth daily. 90 tablet 3   Semaglutide-Weight Management (WEGOVY) 1.7 MG/0.75ML SOAJ Inject 1.7 mg into the skin once a week. 3 mL 5   tiZANidine (ZANAFLEX) 4 MG tablet Take 1 tablet (4 mg total) by mouth every 8 (eight) hours as needed for muscle spasms. 60 tablet 2   Semaglutide-Weight Management (WEGOVY) 1 MG/0.5ML SOAJ Inject 1 mg into the skin once a week. 2 mL 0   No facility-administered  medications prior to visit.   Allergies  Allergen Reactions   Percocet [Oxycodone-Acetaminophen] Nausea Only     Objective:   Today's Vitals   02/14/22 1515  BP: 132/78  Pulse: 91  Temp: (!) 96.9 F (36.1 C)  SpO2: 97%  Weight: 251 lb 6.4 oz (114 kg)  Height: '5\' 4"'$  (1.626 m)   Body mass index is 43.15 kg/m.   General: Well developed, well nourished. No acute distress. Psych: Alert and oriented. Normal mood and affect.  There are no preventive care reminders  to display for this patient.    Assessment & Plan:   1. Morbid obesity with BMI of 40.0-44.9, adult (HCC) Maximum weight: 276 lbs (06/2021) Current weight: 251 lbs Weight change since last visit: - 6 lbs.  We will continue the current dose of Wegovy 1.7 mg weekly. I encouraged her to try and increase her physical activity, now that her lower back pain issues have improved with her right ablation and left ESI.  2. Essential hypertension Blood pressure is adequately managed currently. We will continue her current multi-drug regimen.  3. Hidradenitis suppurativa I will prescribe a course of Keflex. She should continue hot soaks or hot compresses. I urged her to speak with her dermatologist to see if she may be a candidate for other therapies.  - cephALEXin (KEFLEX) 500 MG capsule; Take 1 capsule (500 mg total) by mouth 4 (four) times daily for 7 days.  Dispense: 28 capsule; Refill: 0  4. Need for influenza vaccination  - Flu Vaccine QUAD 6+ mos PF IM (Fluarix Quad PF)  Return in about 3 months (around 05/16/2022) for Reassessment.   Haydee Salter, MD

## 2022-03-02 ENCOUNTER — Ambulatory Visit: Payer: BC Managed Care – PPO

## 2022-03-05 ENCOUNTER — Ambulatory Visit: Payer: BC Managed Care – PPO | Admitting: Family Medicine

## 2022-03-05 VITALS — BP 136/88 | HR 92 | Ht 64.0 in | Wt 249.0 lb

## 2022-03-05 DIAGNOSIS — M545 Low back pain, unspecified: Secondary | ICD-10-CM

## 2022-03-05 DIAGNOSIS — G8929 Other chronic pain: Secondary | ICD-10-CM | POA: Diagnosis not present
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
## 2022-03-05 NOTE — Patient Instructions (Addendum)
Thank you for coming in today.   Let me know if you need to repeat the injections or repeat the ablations or nerve black leading into the ablation.

## 2022-03-14 ENCOUNTER — Encounter: Payer: Self-pay | Admitting: Family Medicine

## 2022-03-15 NOTE — Progress Notes (Signed)
Patient ID: Tammy Hogan                 DOB: [birth year]                      MRN: [id number]      HPI: Tammy Hogan is a 46 y.o. female referred by Dr.Varanasi to HTN clinic. PMH is significant for cardiomyopathy with systolic heart failure (now recovered), hypertension and hyperlipidemia. She is managed on carvedilol 25 mg bid, furosemide 20 mg daily, lisinopril 40 mg daily, amlodipine 10 mg daily and a daily 81 mg aspirin. She had been on spironolactone related to hair loss, but her cardiologist did feel it benefited her CHF. This has now been stopped and she is on minoxidil.    Patient reported no acute concern. Takes all her BP medications at night as she forgets taking them in the morning including her 2 tabs of carvedilol 25 mg. Follows healthy low salt diet most of time but she reports hard time drinking enough fluid. Does not like plain water only drinks 1 cup of sweet tea and 1 cup of coffee and cranberry juice sometimes. Also, reports Wegovy have been affecting her BM and she is constipated. Patient states that she can try drinking flavored water instead of plain. Does not do regular exercise but willing to start using stationary bike that she has at home.   Current HTN meds: carvedilol 25 mg bid, furosemide 20 mg daily, lisinopril 40 mg daily, amlodipine 10 mg daily  Previously tried: Spironolactone 50 mg  BP goal: <130/80  Family History: mother- hypertension hyperlipidemia, colon polyps Sister- hypertension, Father- drug abuse/drug overdose   Social History:  salt low intake  process meat  Smoke: quit 2022 Dec  EtOH : occasional (1 glass may be once month)  Diet: try to eat healthy (chicken, fish, shrimp) vegetables -lots of salads (broccoli, brussels sprouts,green beans) Drink: does not like palin water only 1 cup of sweet tea  cranberry juice- sometime, coffee- 1 cup.   Exercise: The patient does not participate in regular exercise at present. Take her dog  for walk -10-15 min leisure walk     Home BP readings: does not check at home    Wt Readings from Last 3 Encounters:  03/16/22 249 lb 4.8 oz (113.1 kg)  03/05/22 249 lb (112.9 kg)  02/14/22 251 lb 6.4 oz (114 kg)   BP Readings from Last 3 Encounters:  03/16/22 122/72  03/05/22 136/88  02/14/22 132/78   Pulse Readings from Last 3 Encounters:  03/16/22 96  03/05/22 92  02/14/22 91    Renal function: CrCl cannot be calculated (Patient's most recent lab result is older than the maximum 21 days allowed.).  Past Medical History:  Diagnosis Date   Anal fissure    Anemia    past hx    Anginal pain ([place])    Anxiety    [place], [place]   Bimalleolar fracture of left ankle 07/10/2013   CHF (congestive heart failure) (HCC)    systolic heart failure 4765   Depression    GERD (gastroesophageal reflux disease)    History of cardiac cath 07/23/2012   Dr Irish Lack   History of echocardiogram    Cardiomyopathy-reduced EF 35-40% with wall m otion abnormality concerning for ischemia. Hypokinesis of the anterior and anteroseptal walls to the apex   HPV (human papilloma virus) infection    Hyperlipidemia    Hypertension    PCOS (  polycystic ovarian syndrome)    pt denies   PPD positive, treated 2008   [place],     Current Outpatient Medications on File Prior to Visit  Medication Sig Dispense Refill   AMBULATORY NON FORMULARY MEDICATION Medication Name: Diltiazem ointment 2%- apply to anal sphincter 5 times a day as instructed per Dr. Henrene Pastor 30 g 3   amLODipine (NORVASC) 10 MG tablet Take 1 tablet (10 mg total) by mouth daily. 90 tablet 3   aspirin 81 MG tablet Take 81 mg by mouth daily.     atorvastatin (LIPITOR) 10 MG tablet TAKE ONE TABLET BY MOUTH DAILY 90 tablet 3   buPROPion (WELLBUTRIN XL) 300 MG 24 hr tablet Take 1 tablet (300 mg total) by mouth daily. 90 tablet 3   busPIRone (BUSPAR) 5 MG tablet TAKE ONE TABLET BY MOUTH DAILY AS NEEDED  - TAKE 1 HOUR PRIOR TO TESTING 20 tablet  3   carvedilol (COREG) 25 MG tablet TAKE ONE TABLET BY MOUTH TWICE A DAY 180 tablet 3   clonazePAM (KLONOPIN) 0.5 MG tablet Talk half tab daily as needed 30 tablet 1   finasteride (PROSCAR) 5 MG tablet Take 1 mg by mouth daily.     fluconazole (DIFLUCAN) 150 MG tablet Take 1 tablet on day 4 of antibiotics.  Take second tablet 3 days later. 2 tablet 0   furosemide (LASIX) 20 MG tablet TAKE ONE TABLET BY MOUTH DAILY 90 tablet 3   gabapentin (NEURONTIN) 300 MG capsule Take 1 capsule (300 mg total) by mouth at bedtime. 90 capsule 3   ibuprofen (ADVIL) 400 MG tablet Take 1 tablet (400 mg total) by mouth every 8 (eight) hours as needed for up to 30 doses. 30 tablet 0   ibuprofen (ADVIL) 600 MG tablet Take 1 tablet (600 mg total) by mouth every 6 (six) hours as needed. 30 tablet 0   levonorgestrel (MIRENA) 20 MCG/24HR IUD 1 each by Intrauterine route once.     lisinopril (ZESTRIL) 40 MG tablet TAKE ONE TABLET BY MOUTH DAILY 90 tablet 3   minoxidil (LONITEN) 2.5 MG tablet Take by mouth.     Minoxidil (ROGAINE EX) Apply topically.     nitroGLYCERIN (NITROSTAT) 0.4 MG SL tablet Place 1 tablet (0.4 mg total) under the tongue every 5 (five) minutes as needed for chest pain. 25 tablet 3   nystatin ointment (MYCOSTATIN) APPLY TO THE AFFECTED AREA(S) BY TOPICAL ROUTE 2 TIMES PER DAY     pantoprazole (PROTONIX) 40 MG tablet Take 1 tablet (40 mg total) by mouth daily. 90 tablet 3   Semaglutide-Weight Management (WEGOVY) 1.7 MG/0.75ML SOAJ Inject 1.7 mg into the skin once a week. 3 mL 5   No current facility-administered medications on file prior to visit.    Allergies  Allergen Reactions   Percocet [Oxycodone-Acetaminophen] Nausea Only    Blood pressure 122/72, pulse 96, weight 249 lb 4.8 oz (113.1 kg), SpO2 99 %.    1. Hypertension -  Essential hypertension Assessment: [id number]- in office 122/72 (goal <130/80) heart rate -  96 Denies dizziness, SOB, headache or palpitation  Patient is  tolerating all her BP mediations without side effects. Patient forgets the morning dose of the carvedilol so takes 2 tabs of 25 mg at night with other BP mediations at night. Given high heart rate and low BP it is reasonable to change carvedilol to selective beta-blocker,  Does not do regular exercise but Wegovy helping to loose weight but gives constipation  Eats healthy  low salt diet but does not drink enough fluid.  Motivated to start regular exercise and improve fluid intake (mainly water)  Plan: Will change carvedilol 25 mg bid  to metoprolol XR 150 mg once daily dose to improve heart rate and adherance Start exercising 20-30 min most days of the week Patient to check BP at home and bring readings next appoitment  Patient to follow up with pharm D in 5 weeks     Thank you  Cammy Copa, Pharm.D [facility] [address], [place], [place] [address]  Phone: [phone]; Fax: [phone]

## 2022-03-15 NOTE — Telephone Encounter (Signed)
PA submitted through cover my meds to [employer].  Awaiting response.  Dm/cma   (Key: [id number]

## 2022-03-16 ENCOUNTER — Ambulatory Visit: Payer: BC Managed Care – PPO | Attending: Cardiology | Admitting: Student

## 2022-03-16 VITALS — BP 122/72 | HR 96 | Wt 249.3 lb

## 2022-03-16 DIAGNOSIS — I10 Essential (primary) hypertension: Secondary | ICD-10-CM | POA: Diagnosis not present

## 2022-03-16 MED ORDER — METOPROLOL SUCCINATE ER 100 MG PO TB24: 150.0000 mg | ORAL_TABLET | Freq: Every day | ORAL | 3 refills | Status: DC

## 2022-03-16 NOTE — Assessment & Plan Note (Addendum)
Assessment:  [id number]- in office 122/72 (goal <130/80) heart rate -  96  Denies dizziness, SOB, headache or palpitation   Patient is tolerating all her BP mediations without side effects.  Patient forgets the morning dose of the carvedilol so takes 2 tabs of 25 mg at night with other BP mediations at night. Given high heart rate and low BP it is reasonable to change carvedilol to selective beta-blocker,   Does not do regular exercise but Wegovy helping to loose weight but gives constipation   Eats healthy low salt diet but does not drink enough fluid.   Motivated to start regular exercise and improve fluid intake (mainly water)  Plan:  Will change carvedilol 25 mg bid  to metoprolol XR 150 mg once daily dose to improve heart rate and adherance  Start exercising 20-30 min most days of the week  Patient to check BP at home and bring readings next appoitment   Patient to follow up with pharm D in 5 weeks

## 2022-03-16 NOTE — Patient Instructions (Addendum)
Changes made to the medications:   Will try to check with insurance if Coreg CR  is covered or not.  Continue current medications (carvedilol 50 mg  daily, furosemide 20 mg daily, lisinopril 40 mg daily, amlodipine 10 mg daily)  Blood pressure goal <130/80 Exercise 20 min everyday  Drink lots of water   Important lifestyle changes to control high blood pressure  Intervention  Effect on the BP  Lose extra pounds and watch your waistline Weight loss is one of the most effective lifestyle changes for controlling blood pressure. If you're overweight or obese, losing even a small amount of weight can help reduce blood pressure. Blood pressure might go down by about 1 millimeter of mercury (mm Hg) with each kilogram (about 2.2 pounds) of weight lost.  Exercise regularly As a general goal, aim for at least 30 minutes of moderate physical activity every day. Regular physical activity can lower high blood pressure by about 5 to 8 mm Hg.  Eat a healthy diet Eating a diet rich in whole grains, fruits, vegetables, and low-fat dairy products and low in saturated fat and cholesterol. A healthy diet can lower high blood pressure by up to 11 mm Hg.  Reduce salt (sodium) in your diet Even a small reduction of sodium in the diet can improve heart health and reduce high blood pressure by about 5 to 6 mm Hg.  Limit alcohol One drink equals 12 ounces of beer, 5 ounces of wine, or 1.5 ounces of 80-proof liquor.  Limiting alcohol to less than one drink a day for women or two drinks a day for men can help lower blood pressure by about 4 mm Hg.   Please call me at [phone] with any questions.

## 2022-03-16 NOTE — Telephone Encounter (Signed)
Approved from 03/15/22 - 10/14/2022.  Pharmacy and patient notified .  Dm/cma

## 2022-03-27 NOTE — Addendum Note (Signed)
Addended by: Marcelle Overlie D on: 03/27/2022 10:25 AM   Modules accepted: Orders

## 2022-04-13 ENCOUNTER — Encounter: Payer: Self-pay | Admitting: Family Medicine

## 2022-04-13 DIAGNOSIS — M47816 Spondylosis without myelopathy or radiculopathy, lumbar region: Secondary | ICD-10-CM

## 2022-04-13 DIAGNOSIS — G8929 Other chronic pain: Secondary | ICD-10-CM

## 2022-04-16 ENCOUNTER — Other Ambulatory Visit: Payer: Self-pay | Admitting: Family Medicine

## 2022-04-25 ENCOUNTER — Ambulatory Visit: Payer: BC Managed Care – PPO | Admitting: Pharmacist

## 2022-04-25 NOTE — Progress Notes (Deleted)
Patient ID: Tammy Hogan                 DOB: 23-Jul-1975                      MRN: [id number]      HPI: Tammy Hogan is a 46 y.o. female referred by Dr. Irish Lack to HTN clinic. PMH is significant for cardiomyopathy with systolic heart failure (now recovered), hypertension and hyperlipidemia. Patient recently seen in clinic and reported not taking her coreg twice daily, but taking both at night since she could not remember the morning dose. She was switched to metoprolol XL 150 mg QD to help with adherence and better HR control. Did not check blood pressure at home, but was supposed to start checking blood pressure at home and bring in readings at next appointment. Has been watching salt in her diet and to increase her fluid intake. She also was motivated to start regular exercise and 20-30 minutes most days of the week. Follow up appointment.  Current HTN meds: metoprolol XL'150mg'$  , furosemide 20 mg daily, lisinopril 40 mg daily, amlodipine 10 mg daily  Previously tried: Spironolactone 50 mg (cardiologist felt did not benefit her CHF), coreg 25 mg BID (adherence) BP goal: <130/80  Family History: mother- hypertension hyperlipidemia, colon polyps Sister- hypertension, Father- drug abuse/drug overdose    Social History:  salt low intake  process meat  Smoke: quit 2022 Dec  EtOH : occasional (1 glass may be once month)  Diet:   Exercise:   Home BP readings: has not been checking at home  Wt Readings from Last 3 Encounters:  03/16/22 249 lb 4.8 oz (113.1 kg)  03/05/22 249 lb (112.9 kg)  02/14/22 251 lb 6.4 oz (114 kg)   BP Readings from Last 3 Encounters:  03/16/22 122/72  03/05/22 136/88  02/14/22 132/78   Pulse Readings from Last 3 Encounters:  03/16/22 96  03/05/22 92  02/14/22 91    Renal function: CrCl cannot be calculated (Patient's most recent lab result is older than the maximum 21 days allowed.).  Past Medical History:  Diagnosis Date   Anal  fissure    Anemia    past hx    Anginal pain ([place])    Anxiety    [place], [place]   Bimalleolar fracture of left ankle 07/10/2013   CHF (congestive heart failure) (HCC)    systolic heart failure 1696   Depression    GERD (gastroesophageal reflux disease)    History of cardiac cath 07/23/2012   Dr Irish Lack   History of echocardiogram    Cardiomyopathy-reduced EF 35-40% with wall m otion abnormality concerning for ischemia. Hypokinesis of the anterior and anteroseptal walls to the apex   HPV (human papilloma virus) infection    Hyperlipidemia    Hypertension    PCOS (polycystic ovarian syndrome)    pt denies   PPD positive, treated 2008   [place], [place]    Current Outpatient Medications on File Prior to Visit  Medication Sig Dispense Refill   AMBULATORY NON FORMULARY MEDICATION Medication Name: Diltiazem ointment 2%- apply to anal sphincter 5 times a day as instructed per Dr. Henrene Pastor 30 g 3   amLODipine (NORVASC) 10 MG tablet Take 1 tablet (10 mg total) by mouth daily. 90 tablet 3   aspirin 81 MG tablet Take 81 mg by mouth daily.     atorvastatin (LIPITOR) 10 MG tablet TAKE ONE TABLET BY MOUTH DAILY 90  tablet 3   buPROPion (WELLBUTRIN XL) 300 MG 24 hr tablet Take 1 tablet (300 mg total) by mouth daily. 90 tablet 3   busPIRone (BUSPAR) 5 MG tablet TAKE ONE TABLET BY MOUTH DAILY AS NEEDED  - TAKE 1 HOUR PRIOR TO TESTING 20 tablet 3   clonazePAM (KLONOPIN) 0.5 MG tablet Talk half tab daily as needed 30 tablet 1   finasteride (PROSCAR) 5 MG tablet Take 1 mg by mouth daily.     fluconazole (DIFLUCAN) 150 MG tablet Take 1 tablet on day 4 of antibiotics.  Take second tablet 3 days later. 2 tablet 0   furosemide (LASIX) 20 MG tablet TAKE ONE TABLET BY MOUTH DAILY 90 tablet 3   gabapentin (NEURONTIN) 300 MG capsule Take 1 capsule (300 mg total) by mouth at bedtime. 90 capsule 3   ibuprofen (ADVIL) 400 MG tablet Take 1 tablet (400 mg total) by mouth every 8 (eight) hours as needed for up to 30 doses.  30 tablet 0   ibuprofen (ADVIL) 600 MG tablet Take 1 tablet (600 mg total) by mouth every 6 (six) hours as needed. 30 tablet 0   levonorgestrel (MIRENA) 20 MCG/24HR IUD 1 each by Intrauterine route once.     lisinopril (ZESTRIL) 40 MG tablet TAKE ONE TABLET BY MOUTH DAILY 90 tablet 3   metoprolol succinate (TOPROL-XL) 100 MG 24 hr tablet Take 1.5 tablets (150 mg total) by mouth daily. Take with or immediately following a meal. 45 tablet 3   minoxidil (LONITEN) 2.5 MG tablet Take by mouth.     Minoxidil (ROGAINE EX) Apply topically.     nitroGLYCERIN (NITROSTAT) 0.4 MG SL tablet Place 1 tablet (0.4 mg total) under the tongue every 5 (five) minutes as needed for chest pain. 25 tablet 3   nystatin ointment (MYCOSTATIN) APPLY TO THE AFFECTED AREA(S) BY TOPICAL ROUTE 2 TIMES PER DAY     pantoprazole (PROTONIX) 40 MG tablet Take 1 tablet (40 mg total) by mouth daily. 90 tablet 3   WEGOVY 1.7 MG/0.75ML SOAJ INJECT 1.7 MG UNDER THE SKIN ONCE WEEKLY 3 mL 5   No current facility-administered medications on file prior to visit.    Allergies  Allergen Reactions   Percocet [Oxycodone-Acetaminophen] Nausea Only    There were no vitals taken for this visit.   Assessment/Plan: -assess if patient has been taking her bp at home/look at readings -any high/low bps -tolerating metoprolol now? Any side effects -office bp -assess her diet and exercise  No BP recorded.  {Refresh Note OR Click here to enter BP  :1}***   1. Hypertension -   No problem-specific Assessment & Plan notes found for this encounter.  '@MTPCOMPLETEDLIST'$ @   Thank you  Ramond Dial, Pharm.D, BCPS, CPP [facility] of [facility] [place] [address], [place], [place] [address]  Phone: [phone]; Fax: [phone]

## 2022-05-02 ENCOUNTER — Other Ambulatory Visit: Payer: Self-pay | Admitting: Family Medicine

## 2022-05-02 ENCOUNTER — Telehealth: Payer: Self-pay | Admitting: Family Medicine

## 2022-05-02 DIAGNOSIS — M545 Low back pain, unspecified: Secondary | ICD-10-CM

## 2022-05-02 DIAGNOSIS — M47816 Spondylosis without myelopathy or radiculopathy, lumbar region: Secondary | ICD-10-CM

## 2022-05-02 NOTE — Telephone Encounter (Signed)
-----   Message from Tammy Hogan sent at 05/01/2022  1:58 PM EST ----- Will will need an order in Epic for [employer]. Pt must have Medial Block Branch prior to scheduling RFA. Pt had Facets done in August 2023 but not a MBB  Thanks, Vivien Presto

## 2022-05-04 ENCOUNTER — Other Ambulatory Visit: Payer: Self-pay | Admitting: Family Medicine

## 2022-05-04 DIAGNOSIS — F411 Generalized anxiety disorder: Secondary | ICD-10-CM

## 2022-05-14 ENCOUNTER — Other Ambulatory Visit: Payer: BC Managed Care – PPO

## 2022-05-14 ENCOUNTER — Inpatient Hospital Stay: Admission: RE | Admit: 2022-05-14 | Payer: BC Managed Care – PPO | Source: Ambulatory Visit

## 2022-05-19 ENCOUNTER — Telehealth: Payer: BC Managed Care – PPO | Admitting: Physician Assistant

## 2022-05-19 DIAGNOSIS — R6889 Other general symptoms and signs: Secondary | ICD-10-CM | POA: Diagnosis not present

## 2022-05-20 MED ORDER — FLUTICASONE PROPIONATE 50 MCG/ACT NA SUSP: 2.0000 | Freq: Every day | NASAL | 0 refills | Status: DC

## 2022-05-20 MED ORDER — BENZONATATE 100 MG PO CAPS: 100.0000 mg | ORAL_CAPSULE | Freq: Three times a day (TID) | ORAL | 0 refills | Status: DC | PRN

## 2022-05-20 MED ORDER — ONDANSETRON HCL 4 MG PO TABS: 4.0000 mg | ORAL_TABLET | Freq: Three times a day (TID) | ORAL | 0 refills | Status: DC | PRN

## 2022-05-20 NOTE — Progress Notes (Signed)
E visit for Flu like symptoms   We are sorry that you are not feeling well.  Here is how we plan to help! Based on what you have shared with me it looks like you may have flu-like symptoms that should be watched but do not seem to indicate anti-viral treatment.  Influenza or "the flu" is   an infection caused by a respiratory virus. The flu virus is highly contagious and persons who did not receive their yearly flu vaccination may "catch" the flu from close contact.  We have anti-viral medications to treat the viruses that cause this infection. They are not a "cure" and only shorten the course of the infection. These prescriptions are most effective when they are given within the first 2 days of "flu" symptoms. Antiviral medication are indicated if you have a high risk of complications from the flu. You should  also consider an antiviral medication if you are in close contact with someone who is at risk. These medications can help patients avoid complications from the flu  but have side effects that you should know. Possible side effects from Tamiflu or oseltamivir include nausea, vomiting, diarrhea, dizziness, headaches, eye redness, sleep problems or other respiratory symptoms. You should not take Tamiflu if you have an allergy to oseltamivir or any to the ingredients in Tamiflu.  Based upon your symptoms and potential risk factors I recommend that you follow the flu symptoms recommendation that I have listed below.  For nasal congestion, you may use an oral decongestants such as Mucinex D or if you have glaucoma or high blood pressure use plain Mucinex.  Saline nasal spray or nasal drops can help and can safely be used as often as needed for congestion.  For your congestion, I have prescribed Fluticasone nasal spray one spray in each nostril twice a day  If you do not have a history of heart disease, hypertension, diabetes or thyroid disease, prostate/bladder issues or glaucoma, you may also use  Sudafed to treat nasal congestion.  It is highly recommended that you consult with a pharmacist or your primary care physician to ensure this medication is safe for you to take.     If you have a cough, you may use cough suppressants such as Delsym and Robitussin.  If you have glaucoma or high blood pressure, you can also use Coricidin HBP.   For cough I have prescribed for you A prescription cough medication called Tessalon Perles 100 mg. You may take 1-2 capsules every 8 hours as needed for cough  If you have a sore or scratchy throat, use a saltwater gargle-  to  teaspoon of salt dissolved in a 4-ounce to 8-ounce glass of warm water.  Gargle the solution for approximately 15-30 seconds and then spit.  It is important not to swallow the solution.  You can also use throat lozenges/cough drops and Chloraseptic spray to help with throat pain or discomfort.  Warm or cold liquids can also be helpful in relieving throat pain.  For headache, pain or general discomfort, you can use Ibuprofen or Tylenol as directed.   Some authorities believe that zinc sprays or the use of Echinacea may shorten the course of your symptoms.  I have prescribed Zofran '4mg'$  Take 1 tablet every 8 hours as needed for nausea.  ANYONE WHO HAS FLU SYMPTOMS SHOULD: Stay home. The flu is highly contagious and going out or to work exposes others! Be sure to drink plenty of fluids. Water is fine as well  as fruit juices, sodas and electrolyte beverages. You may want to stay away from caffeine or alcohol. If you are nauseated, try taking small sips of liquids. How do you know if you are getting enough fluid? Your urine should be a pale yellow or almost colorless. Get rest. Taking a steamy shower or using a humidifier may help nasal congestion and ease sore throat pain. Using a saline nasal spray works much the same way. Cough drops, hard candies and sore throat lozenges may ease your cough. Line up a caregiver. Have someone check on  you regularly.   GET HELP RIGHT AWAY IF: You cannot keep down liquids or your medications. You become short of breath Your fell like you are going to pass out or loose consciousness. Your symptoms persist after you have completed your treatment plan MAKE SURE YOU  Understand these instructions. Will watch your condition. Will get help right away if you are not doing well or get worse.  Your e-visit answers were reviewed by a board certified advanced clinical practitioner to complete your personal care plan.  Depending on the condition, your plan could have included both over the counter or prescription medications.  If there is a problem please reply  once you have received a response from your provider.  Your safety is important to [place].  If you have drug allergies check your prescription carefully.    You can use MyChart to ask questions about today's visit, request a non-urgent call back, or ask for a work or school excuse for 24 hours related to this e-Visit. If it has been greater than 24 hours you will need to follow up with your provider, or enter a new e-Visit to address those concerns.  You will get an e-mail in the next two days asking about your experience.  I hope that your e-visit has been valuable and will speed your recovery. Thank you for using e-visits.  I have spent 5 minutes in review of e-visit questionnaire, review and updating patient chart, medical decision making and response to patient.   Mar Daring, PA-C

## 2022-05-21 ENCOUNTER — Other Ambulatory Visit: Payer: Self-pay | Admitting: Family Medicine

## 2022-05-21 ENCOUNTER — Ambulatory Visit
Admission: RE | Admit: 2022-05-21 | Discharge: 2022-05-21 | Disposition: A | Discharge status: Home / Self Care | Payer: BC Managed Care – PPO | Source: Ambulatory Visit | Attending: Family Medicine | Admitting: Family Medicine

## 2022-05-21 DIAGNOSIS — M47816 Spondylosis without myelopathy or radiculopathy, lumbar region: Secondary | ICD-10-CM

## 2022-05-21 DIAGNOSIS — M545 Low back pain, unspecified: Secondary | ICD-10-CM

## 2022-05-21 NOTE — Discharge Instructions (Signed)

## 2022-05-28 ENCOUNTER — Other Ambulatory Visit: Payer: Self-pay | Admitting: Family Medicine

## 2022-05-28 ENCOUNTER — Ambulatory Visit
Admission: RE | Admit: 2022-05-28 | Discharge: 2022-05-28 | Disposition: A | Discharge status: Home / Self Care | Payer: BC Managed Care – PPO | Source: Ambulatory Visit | Attending: Family Medicine | Admitting: Family Medicine

## 2022-05-28 DIAGNOSIS — M47816 Spondylosis without myelopathy or radiculopathy, lumbar region: Secondary | ICD-10-CM

## 2022-05-28 DIAGNOSIS — M545 Low back pain, unspecified: Secondary | ICD-10-CM

## 2022-05-28 MED ORDER — FENTANYL CITRATE PF 50 MCG/ML IJ SOSY
25.0000 ug | PREFILLED_SYRINGE | INTRAMUSCULAR | Status: DC | PRN
  Administered 2022-05-28 (×2): 25 ug via INTRAVENOUS
  Administered 2022-05-28: 50 ug via INTRAVENOUS

## 2022-05-28 MED ORDER — MIDAZOLAM HCL 2 MG/2ML IJ SOLN
1.0000 mg | INTRAMUSCULAR | Status: DC | PRN
  Administered 2022-05-28 (×2): 0.5 mg via INTRAVENOUS
  Administered 2022-05-28: 1 mg via INTRAVENOUS
  Administered 2022-05-28: 0.5 mg via INTRAVENOUS
  Administered 2022-05-28: 1 mg via INTRAVENOUS

## 2022-05-28 MED ORDER — KETOROLAC TROMETHAMINE 30 MG/ML IJ SOLN
30.0000 mg | Freq: Once | INTRAMUSCULAR | Status: AC
  Administered 2022-05-28: 30 mg via INTRAVENOUS

## 2022-05-28 MED ORDER — SODIUM CHLORIDE 0.9 % IV SOLN: INTRAVENOUS | Status: DC

## 2022-05-28 NOTE — Progress Notes (Signed)
Pt back in nursing recovery area. Pt still drowsy from procedure but will wake up when spoken to. Pt follows commands, talks in complete sentences and has no complaints at this time. Pt will remain in nurses station until discharged by Radiologist.   

## 2022-05-28 NOTE — Discharge Instructions (Signed)
Radio Frequency Ablation Post Procedure Discharge Instructions  May resume a regular diet and any medications that you routinely take (including pain medications). No driving day of procedure. Upon discharge go home and rest for at least 4 hours.  May use an ice pack as needed to injection sites on back. Remove bandades later, today.    Please contact our office at [phone] for the following symptoms:  Fever greater than 100 degrees Increased swelling, pain, or redness at injection site.   Thank you for visiting [facility].  YOU MAY RESUME YOUR ASPIRIN ANYTIME AFTER PROCEDURE TODAY  

## 2022-06-24 ENCOUNTER — Telehealth: Payer: BC Managed Care – PPO | Admitting: Family

## 2022-06-24 DIAGNOSIS — R112 Nausea with vomiting, unspecified: Secondary | ICD-10-CM

## 2022-06-24 MED ORDER — ONDANSETRON HCL 4 MG PO TABS: 4.0000 mg | ORAL_TABLET | Freq: Three times a day (TID) | ORAL | 0 refills | Status: DC | PRN

## 2022-06-24 NOTE — Progress Notes (Signed)
Virtual Visit Consent   Tammy Hogan, you are scheduled for a virtual visit with a [facility] provider today. Just as with appointments in the office, your consent must be obtained to participate. Your consent will be active for this visit and any virtual visit you may have with one of our providers in the next 365 days. If you have a MyChart account, a copy of this consent can be sent to you electronically.  As this is a virtual visit, video technology does not allow for your provider to perform a traditional examination. This may limit your provider's ability to fully assess your condition. If your provider identifies any concerns that need to be evaluated in person or the need to arrange testing (such as labs, EKG, etc.), we will make arrangements to do so. Although advances in technology are sophisticated, we cannot ensure that it will always work on either your end or our end. If the connection with a video visit is poor, the visit may have to be switched to a telephone visit. With either a video or telephone visit, we are not always able to ensure that we have a secure connection.  By engaging in this virtual visit, you consent to the provision of healthcare and authorize for your insurance to be billed (if applicable) for the services provided during this visit. Depending on your insurance coverage, you may receive a charge related to this service.  I need to obtain your verbal consent now. Are you willing to proceed with your visit today? Tammy Hogan has provided verbal consent on 06/24/2022 for a virtual visit (video or telephone). Tammy Dun, FNP  Date: 06/24/2022 2:56 PM  Virtual Visit via Video Note   I, Tammy Hogan, connected with  Tammy Hogan  ([id number], [birth year]) on 06/24/22 at  3:00 PM EST by a video-enabled telemedicine application and verified that I am speaking with the correct person using two identifiers.  Location: Patient: Virtual Visit  Location Patient: Home Provider: Virtual Visit Location Provider: Home Office   I discussed the limitations of evaluation and management by telemedicine and the availability of in person appointments. The patient expressed understanding and agreed to proceed.    History of Present Illness: Tammy Hogan is a 47 y.o. who identifies as a female who was assigned female at birth, and is being seen today for nausea and vomiting. Reports she is currently taking Wegovy 1.7 mg, however, was not able to get the rx for 4-5 weeks. She took her dose yesterday. Since then has nausea and vomiting.   HPI: Emesis  This is a new problem. The current episode started yesterday. The problem occurs 2 to 4 times per day. The problem has been unchanged.    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   Gastroesophageal reflux disease 06/20/2016   Mixed hyperlipidemia 05/30/2016   Cervicalgia 05/21/2016   Generalized anxiety disorder 05/15/2016   Arthralgia of multiple joints 05/15/2016   Morbid obesity with BMI of 40.0-44.9, adult ([place]) 05/15/2016   Essential hypertension 09/07/2014   Cardiomyopathy ([place]) 93/81/0175   Systolic heart failure ([place]) 07/02/2012    Allergies:  Allergies  Allergen Reactions  Percocet [Oxycodone-Acetaminophen] Nausea Only   Medications:  Current Outpatient Medications:    ondansetron (ZOFRAN) 4 MG tablet, Take 1 tablet (4 mg total) by mouth every 8 (eight) hours as needed for nausea or vomiting., Disp: 30 tablet, Rfl: 0   AMBULATORY NON FORMULARY MEDICATION, Medication Name: Diltiazem ointment 2%- apply to anal sphincter 5  times a day as instructed per Dr. Henrene Pastor, Disp: 30 g, Rfl: 3   amLODipine (NORVASC) 10 MG tablet, Take 1 tablet (10 mg total) by mouth daily., Disp: 90 tablet, Rfl: 3   aspirin 81 MG tablet, Take 81 mg by mouth daily., Disp: , Rfl:    atorvastatin (LIPITOR) 10 MG tablet, TAKE ONE TABLET BY MOUTH DAILY, Disp: 90 tablet, Rfl: 3   benzonatate (TESSALON) 100 MG capsule, Take 1 capsule (100 mg total) by mouth 3 (three) times daily as needed., Disp: 30 capsule, Rfl: 0   buPROPion (WELLBUTRIN XL) 300 MG 24 hr tablet, Take 1 tablet (300 mg total) by mouth daily., Disp: 90 tablet, Rfl: 3   busPIRone (BUSPAR) 5 MG tablet, Take 1 tablet (5 mg total) by mouth daily as needed., Disp: 20 tablet, Rfl: 3   clonazePAM (KLONOPIN) 0.5 MG tablet, Talk half tab daily as needed, Disp: 30 tablet, Rfl: 1   finasteride (PROSCAR) 5 MG tablet, Take 1 mg by mouth daily., Disp: , Rfl:    fluconazole (DIFLUCAN) 150 MG tablet, Take 1 tablet on day 4 of antibiotics.  Take second tablet 3 days later., Disp: 2 tablet, Rfl: 0   fluticasone (FLONASE) 50 MCG/ACT nasal spray, Place 2 sprays into both nostrils daily., Disp: 16 g, Rfl: 0   furosemide (LASIX) 20 MG tablet, TAKE ONE TABLET BY MOUTH DAILY, Disp: 90 tablet, Rfl: 3   gabapentin (NEURONTIN) 300 MG capsule, Take 1 capsule (300 mg total) by mouth at bedtime., Disp: 90 capsule, Rfl: 3   ibuprofen (ADVIL) 400 MG tablet, Take 1 tablet (400 mg total) by mouth every 8 (eight) hours as needed for up to 30 doses., Disp: 30 tablet, Rfl: 0   ibuprofen (ADVIL) 600 MG tablet, Take 1 tablet (600 mg total) by mouth every 6 (six) hours as needed., Disp: 30 tablet, Rfl: 0   levonorgestrel (MIRENA) 20 MCG/24HR IUD, 1 each by Intrauterine route once., Disp: , Rfl:    lisinopril (ZESTRIL) 40 MG tablet, TAKE ONE TABLET BY MOUTH DAILY, Disp: 90 tablet, Rfl: 3   metoprolol succinate (TOPROL-XL) 100 MG 24 hr tablet, Take 1.5 tablets (150 mg total) by mouth daily. Take with or immediately following a  meal., Disp: 45 tablet, Rfl: 3   minoxidil (LONITEN) 2.5 MG tablet, Take by mouth., Disp: , Rfl:    Minoxidil (ROGAINE EX), Apply topically., Disp: , Rfl:    nitroGLYCERIN (NITROSTAT) 0.4 MG SL tablet, Place 1 tablet (0.4 mg total) under the tongue every 5 (five) minutes as needed for chest pain., Disp: 25 tablet, Rfl: 3   nystatin ointment (MYCOSTATIN), APPLY TO THE AFFECTED AREA(S) BY TOPICAL ROUTE 2 TIMES PER DAY, Disp: , Rfl:    pantoprazole (PROTONIX) 40 MG tablet, Take 1 tablet (40 mg total) by mouth daily., Disp: 90 tablet, Rfl: 3   WEGOVY 1.7 MG/0.75ML SOAJ, INJECT 1.7 MG UNDER THE SKIN ONCE WEEKLY, Disp: 3 mL, Rfl: 5  Observations/Objective: Patient is well-developed, well-nourished in no acute distress.  Resting comfortably  at home.  Head is normocephalic, atraumatic.  No labored breathing.  Speech is clear and coherent with logical content.  Patient is  alert and oriented at baseline.    Assessment and Plan: 1. Nausea and vomiting, unspecified vomiting type - ondansetron (ZOFRAN) 4 MG tablet; Take 1 tablet (4 mg total) by mouth every 8 (eight) hours as needed for nausea or vomiting.  Dispense: 30 tablet; Refill: 0  Recommend to take zofran every 8 hours for the next several days [place] diet Follow up with PCP to discuss Wegovy dosing  Follow Up Instructions: I discussed the assessment and treatment plan with the patient. The patient was provided an opportunity to ask questions and all were answered. The patient agreed with the plan and demonstrated an understanding of the instructions.  A copy of instructions were sent to the patient via MyChart unless otherwise noted below.     The patient was advised to call back or seek an in-person evaluation if the symptoms worsen or if the condition fails to improve as anticipated.  Time:  I spent 8 minutes with the patient via telehealth technology discussing the above problems/concerns.    Tammy Dun, FNP

## 2022-06-25 ENCOUNTER — Other Ambulatory Visit: Payer: Self-pay | Admitting: Family Medicine

## 2022-06-25 ENCOUNTER — Other Ambulatory Visit: Payer: Self-pay | Admitting: Interventional Cardiology

## 2022-06-25 DIAGNOSIS — I10 Essential (primary) hypertension: Secondary | ICD-10-CM

## 2022-06-25 DIAGNOSIS — K219 Gastro-esophageal reflux disease without esophagitis: Secondary | ICD-10-CM

## 2022-06-26 ENCOUNTER — Encounter: Payer: Self-pay | Admitting: Family Medicine

## 2022-06-26 MED ORDER — WEGOVY 0.25 MG/0.5ML ~~LOC~~ SOAJ: 0.2500 mg | SUBCUTANEOUS | 0 refills | Status: DC

## 2022-07-08 ENCOUNTER — Other Ambulatory Visit: Payer: Self-pay | Admitting: Interventional Cardiology

## 2022-07-08 DIAGNOSIS — I10 Essential (primary) hypertension: Secondary | ICD-10-CM

## 2022-07-09 ENCOUNTER — Encounter: Payer: Self-pay | Admitting: Interventional Cardiology

## 2022-07-10 MED ORDER — CARVEDILOL PHOSPHATE ER 80 MG PO CP24: 80.0000 mg | ORAL_CAPSULE | Freq: Every day | ORAL | 3 refills | Status: DC

## 2022-07-10 NOTE — Telephone Encounter (Signed)
Per Dr. Irish Lack- OK to switch to Coreg CR 80 mg daily JV   Placed order for medication to patient's pharmacy.

## 2022-07-12 ENCOUNTER — Other Ambulatory Visit: Payer: Self-pay | Admitting: Interventional Cardiology

## 2022-07-12 DIAGNOSIS — I10 Essential (primary) hypertension: Secondary | ICD-10-CM

## 2022-07-17 ENCOUNTER — Other Ambulatory Visit: Payer: Self-pay | Admitting: Interventional Cardiology

## 2022-07-17 DIAGNOSIS — I10 Essential (primary) hypertension: Secondary | ICD-10-CM

## 2022-07-27 ENCOUNTER — Other Ambulatory Visit: Payer: Self-pay | Admitting: Family Medicine

## 2022-07-27 DIAGNOSIS — F411 Generalized anxiety disorder: Secondary | ICD-10-CM

## 2022-09-12 ENCOUNTER — Encounter: Payer: Self-pay | Admitting: Family Medicine

## 2022-09-12 ENCOUNTER — Ambulatory Visit: Payer: BC Managed Care – PPO | Admitting: Family Medicine

## 2022-09-12 VITALS — BP 124/82 | HR 85 | Ht 64.0 in | Wt 158.0 lb

## 2022-09-12 DIAGNOSIS — M7061 Trochanteric bursitis, right hip: Secondary | ICD-10-CM

## 2022-09-12 DIAGNOSIS — G8929 Other chronic pain: Secondary | ICD-10-CM

## 2022-09-12 DIAGNOSIS — M545 Low back pain, unspecified: Secondary | ICD-10-CM | POA: Diagnosis not present

## 2022-09-12 DIAGNOSIS — M47816 Spondylosis without myelopathy or radiculopathy, lumbar region: Secondary | ICD-10-CM
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
## 2022-09-12 NOTE — Patient Instructions (Addendum)
Thank you for coming in today.   You received an injection today. Seek immediate medical attention if the joint becomes red, extremely painful, or is oozing fluid.   A referral has been placed for your to see Dr. Davy Pique at [facility].

## 2022-09-17 NOTE — Progress Notes (Unsigned)
Cardiology Office Note   Date:  09/18/2022   ID:  Tammy Hogan, DOB [birth year], MRN [id number]  PCP:  Loyola Mastudd, Stephen M, MD    No chief complaint on file.  NICM  Wt Readings from Last 3 Encounters:  09/18/22 248 lb 12.8 oz (112.9 kg)  09/12/22 158 lb (71.7 kg)  03/16/22 249 lb 4.8 oz (113.1 kg)       History of Present Illness: Tammy Hogan is a 47 y.o. female    has obesity, PCOS and a nonischemic cardiomyopathy-diagnosed in 2014.  She has had difficulty controlled her BP.  No CAD by cath in 2/14.   She has had trouble stopping smoking.     Normal LV function noted in 2/16.  She was concerned about her hair loss and that the BP meds are contributing..   In 2021, [place] was [place] for [place]   She got married in 2021.  She is not interested in children and we discussed that pregnancy would be high risk for her given NICM.   Mild activity, not as much as she would like.  She had COVID in Jan and had only mild sx.  Her fiancee had it at the time as well.   She got vaccinated in June 2021.    She quit smoking at the end of 2022.  Cravings are decreasing.    Now working at [employer] [place]   Denies : Chest pain. Dizziness. Leg edema. Nitroglycerin use. Orthopnea. Palpitations. Paroxysmal nocturnal dyspnea. Shortness of breath. Syncope.    Not getting as much exercise as before.  Did lose some weight with Wegovy but had to stop this medicine.  Trying to eat healthier.  Recall that spironolactone was changed to minoxidil to help with hair loss.  She has not noticed any significant change and is going to ask her dermatologist to stop the minoxidil. Past Medical History:  Diagnosis Date   Anal fissure    Anemia    past hx    Anginal pain    Anxiety    [place], [place]   Bimalleolar fracture of left ankle 07/10/2013   CHF (congestive heart failure)    systolic heart failure 2014   Depression    GERD (gastroesophageal reflux disease)     History of cardiac cath 07/23/2012   Dr Eldridge DaceVaranasi   History of echocardiogram    Cardiomyopathy-reduced EF 35-40% with wall m otion abnormality concerning for ischemia. Hypokinesis of the anterior and anteroseptal walls to the apex   HPV (human papilloma virus) infection    Hyperlipidemia    Hypertension    PCOS (polycystic ovarian syndrome)    pt denies   PPD positive, treated 2008   [place], [place]    Past Surgical History:  Procedure Laterality Date   CARDIAC CATHETERIZATION  feb, 2014   Dr Eldridge DaceVaranasi   FOOT SURGERY  10/2019   ORIF ANKLE FRACTURE Left 07/10/2013   Procedure: OPEN REDUCTION INTERNAL FIXATION (ORIF) LEFT BIMALLEOLAR ANKLE FRACTURE;  Surgeon: Kathryne Hitchhristopher Y Blackman, MD;  Location: [facility];  Service: Orthopedics;  Laterality: Left;     Current Outpatient Medications  Medication Sig Dispense Refill   AMBULATORY NON FORMULARY MEDICATION Medication Name: Diltiazem ointment 2%- apply to anal sphincter 5 times a day as instructed per Dr. Marina GoodellPerry 30 g 3   amLODipine (NORVASC) 10 MG tablet Take 1 tablet (10 mg total) by mouth daily. 90 tablet 3   aspirin 81 MG  tablet Take 81 mg by mouth daily.     atorvastatin (LIPITOR) 10 MG tablet TAKE ONE TABLET BY MOUTH DAILY 90 tablet 3   buPROPion (WELLBUTRIN XL) 300 MG 24 hr tablet Take 1 tablet (300 mg total) by mouth daily. 90 tablet 3   busPIRone (BUSPAR) 5 MG tablet TAKE 1 TABLET BY MOUTH DAILY AS NEEDED 30 tablet 6   carvedilol (COREG CR) 80 MG 24 hr capsule Take 1 capsule (80 mg total) by mouth daily. 90 capsule 3   clindamycin (CLINDAGEL) 1 % gel Apply 1 Application topically 2 (two) times daily.     clobetasol (TEMOVATE) 0.05 % external solution Apply topically.     finasteride (PROSCAR) 5 MG tablet Take 1 mg by mouth daily.     fluconazole (DIFLUCAN) 150 MG tablet Take 1 tablet on day 4 of antibiotics.  Take second tablet 3 days later. 2 tablet 0   fluticasone (FLONASE) 50 MCG/ACT nasal spray Place 2 sprays into both nostrils  daily. 16 g 0   furosemide (LASIX) 20 MG tablet TAKE ONE TABLET BY MOUTH DAILY 90 tablet 0   gabapentin (NEURONTIN) 300 MG capsule Take 1 capsule (300 mg total) by mouth at bedtime. 90 capsule 3   ibuprofen (ADVIL) 400 MG tablet Take 1 tablet (400 mg total) by mouth every 8 (eight) hours as needed for up to 30 doses. 30 tablet 0   ibuprofen (ADVIL) 600 MG tablet Take 1 tablet (600 mg total) by mouth every 6 (six) hours as needed. 30 tablet 0   levonorgestrel (MIRENA) 20 MCG/24HR IUD 1 each by Intrauterine route once.     lisinopril (ZESTRIL) 40 MG tablet TAKE ONE TABLET BY MOUTH DAILY 90 tablet 3   minoxidil (LONITEN) 2.5 MG tablet Take by mouth.     nystatin cream (MYCOSTATIN) Apply 1 Application topically 2 (two) times daily.     ondansetron (ZOFRAN) 4 MG tablet Take 1 tablet (4 mg total) by mouth every 8 (eight) hours as needed for nausea or vomiting. 30 tablet 0   pantoprazole (PROTONIX) 40 MG tablet TAKE ONE TABLET BY MOUTH DAILY 90 tablet 3   spironolactone (ALDACTONE) 25 MG tablet Take 1 tablet (25 mg total) by mouth daily. 90 tablet 3   nitroGLYCERIN (NITROSTAT) 0.4 MG SL tablet Place 1 tablet (0.4 mg total) under the tongue every 5 (five) minutes as needed for chest pain. 25 tablet 3   Semaglutide-Weight Management (WEGOVY) 0.25 MG/0.5ML SOAJ Inject 0.25 mg into the skin once a week. (Patient not taking: Reported on 09/18/2022) 2 mL 0   WEGOVY 1.7 MG/0.75ML SOAJ INJECT 1.7 MG UNDER THE SKIN ONCE WEEKLY (Patient not taking: Reported on 09/18/2022) 3 mL 5   No current facility-administered medications for this visit.    Allergies:   Percocet [oxycodone-acetaminophen] and Hydrocodone-acetaminophen    Social History:  The patient  reports that she quit smoking about 16 months ago. Her smoking use included cigarettes. She has a 8.00 pack-year smoking history. She has never used smokeless tobacco. She reports current alcohol use. She reports that she does not use drugs.   Family History:  The  patient's family history includes Alcohol abuse in her paternal grandfather; Cancer in her maternal grandmother; Cancer (age of onset: 18) in an other family member; Cirrhosis in her paternal grandfather; Colon polyps in her mother; Diabetes in her maternal grandmother, maternal uncle, and paternal grandmother; Drug abuse in her father; Glaucoma in her paternal grandmother; Heart failure in her paternal grandmother; Hyperlipidemia  in her mother; Hypertension in her mother, paternal grandmother, and sister; Other in her father.    ROS:  Please see the history of present illness.   Otherwise, review of systems are positive for .   All other systems are reviewed and negative.    PHYSICAL EXAM: VS:  BP (!) 138/96   Pulse 89   Ht 5' 3.5" (1.613 m)   Wt 248 lb 12.8 oz (112.9 kg)   SpO2 98%   BMI 43.38 kg/m  , BMI Body mass index is 43.38 kg/m. GEN: Well nourished, well developed, in no acute distress HEENT: normal Neck: no JVD, carotid bruits, or masses Cardiac: RRR; no murmurs, rubs, or gallops,no edema  Respiratory:  clear to auscultation bilaterally, normal work of breathing GI: soft, nontender, nondistended, + BS MS: no deformity or atrophy Skin: warm and dry, no rash Neuro:  Strength and sensation are intact Psych: euthymic mood, full affect   EKG:   The ekg ordered today demonstrates normal ECG   Recent Labs: No results found for requested labs within last 365 days.   Lipid Panel    Component Value Date/Time   CHOL 161 05/10/2021 0813   CHOL 150 03/11/2017 0950   TRIG 152.0 (H) 05/10/2021 0813   HDL 34.40 (L) 05/10/2021 0813   HDL 39 (L) 03/11/2017 0950   CHOLHDL 5 05/10/2021 0813   VLDL 30.4 05/10/2021 0813   LDLCALC 96 05/10/2021 0813   LDLCALC 82 03/11/2017 0950     Other studies Reviewed: Additional studies/ records that were reviewed today with results demonstrating: labs reviewed.   ASSESSMENT AND PLAN:  Nonischemic cardiomyopathy: EF improved up to an EF  50 to 55% in December 2021. Hypertensive heart disease:  Borderline readings.  144/90.  Was spironolactone in the past for skin issues.  Was stopped and changed to minoxidil for hair loss.  Will restart spironolactone 25 mg daily a.Check electrolytes in a week along with A1c lipids. Morbid obesity: No longer on Wegovy as her insurance does not cover this medicine anymore.  She will try to eat a more whole food, plant-based diet.  She has given up bacon.  She is trying to give out processed foods in general. Prediabetes: Noted in the past.  Will check A1c. Former smoker: She has continued to abstain from smoking.   Current medicines are reviewed at length with the patient today.  The patient concerns regarding her medicines were addressed.  The following changes have been made:  No change  Labs/ tests ordered today include:   Orders Placed This Encounter  Procedures   Comp Met (CMET)   CBC   Lipid Profile   TSH   HgB A1c   EKG 12-Lead    Recommend 150 minutes/week of aerobic exercise Low fat, low carb, high fiber diet recommended  Disposition:   FU in 1 year   Signed, Lance Muss, MD  09/18/2022 5:19 PM    [facility] [address] [place], [place], [place]  [address] Phone: [phone]; Fax: [phone]

## 2022-09-18 ENCOUNTER — Encounter: Payer: Self-pay | Admitting: Interventional Cardiology

## 2022-09-18 ENCOUNTER — Ambulatory Visit: Payer: BC Managed Care – PPO | Attending: Interventional Cardiology | Admitting: Interventional Cardiology

## 2022-09-18 VITALS — BP 138/96 | HR 89 | Ht 63.5 in | Wt 248.8 lb

## 2022-09-18 DIAGNOSIS — R7303 Prediabetes: Secondary | ICD-10-CM | POA: Diagnosis not present

## 2022-09-18 DIAGNOSIS — I119 Hypertensive heart disease without heart failure: Secondary | ICD-10-CM

## 2022-09-18 DIAGNOSIS — I428 Other cardiomyopathies: Secondary | ICD-10-CM | POA: Diagnosis not present

## 2022-09-18 DIAGNOSIS — Z6841 Body Mass Index (BMI) 40.0 and over, adult: Secondary | ICD-10-CM

## 2022-09-18 MED ORDER — SPIRONOLACTONE 25 MG PO TABS: 25.0000 mg | ORAL_TABLET | Freq: Every day | ORAL | 3 refills | Status: DC

## 2022-09-18 MED ORDER — NITROGLYCERIN 0.4 MG SL SUBL: 0.4000 mg | SUBLINGUAL_TABLET | SUBLINGUAL | 3 refills | Status: AC | PRN

## 2022-09-18 NOTE — Patient Instructions (Signed)
Medication Instructions:  Your physician has recommended you make the following change in your medication: Start spironolactone 25 mg by mouth daily   *If you need a refill on your cardiac medications before your next appointment, please call your pharmacy*   Lab Work: Your physician recommends that you return for lab work  on September 26, 2022.  CMET, CBC, lipids, TSH, A1c If you have labs (blood work) drawn today and your tests are completely normal, you will receive your results only by: MyChart Message (if you have MyChart) OR A paper copy in the mail If you have any lab test that is abnormal or we need to change your treatment, we will call you to review the results.   Testing/Procedures: none   Follow-Up: At [facility], you and your health needs are our priority.  As part of our continuing mission to provide you with exceptional heart care, we have created designated Provider Care Teams.  These Care Teams include your primary Cardiologist (physician) and Advanced Practice Providers (APPs -  Physician Assistants and Nurse Practitioners) who all work together to provide you with the care you need, when you need it.  We recommend signing up for the patient portal called "MyChart".  Sign up information is provided on this After Visit Summary.  MyChart is used to connect with patients for Virtual Visits (Telemedicine).  Patients are able to view lab/test results, encounter notes, upcoming appointments, etc.  Non-urgent messages can be sent to your provider as well.   To learn more about what you can do with MyChart, go to [email or web address].    Your next appointment:   12 month(s)  Provider:   Lance Muss, MD     Other Instructions

## 2022-09-19 DIAGNOSIS — F419 Anxiety disorder, unspecified: Secondary | ICD-10-CM | POA: Insufficient documentation

## 2022-09-21 ENCOUNTER — Encounter: Payer: Self-pay | Admitting: Family Medicine

## 2022-09-21 DIAGNOSIS — M47816 Spondylosis without myelopathy or radiculopathy, lumbar region: Secondary | ICD-10-CM | POA: Insufficient documentation

## 2022-09-26 ENCOUNTER — Ambulatory Visit: Payer: BC Managed Care – PPO | Attending: Interventional Cardiology

## 2022-09-26 DIAGNOSIS — I119 Hypertensive heart disease without heart failure: Secondary | ICD-10-CM

## 2022-09-26 DIAGNOSIS — I428 Other cardiomyopathies: Secondary | ICD-10-CM

## 2022-09-26 LAB — LIPID PANEL

## 2022-09-26 LAB — CBC
MCH: 28.1 pg (ref 26.6–33.0)
MCHC: 31.8 g/dL (ref 31.5–35.7)
MCV: 88 fL (ref 79–97)

## 2022-09-26 LAB — COMPREHENSIVE METABOLIC PANEL

## 2022-09-26 LAB — HEMOGLOBIN A1C

## 2022-09-27 ENCOUNTER — Encounter: Payer: Self-pay | Admitting: Family Medicine

## 2022-09-27 LAB — COMPREHENSIVE METABOLIC PANEL
AST: 10 IU/L (ref 0–40)
Albumin/Globulin Ratio: 1.3 (ref 1.2–2.2)
Albumin: 4.1 g/dL (ref 3.9–4.9)
Alkaline Phosphatase: 141 IU/L — ABNORMAL HIGH (ref 44–121)
BUN/Creatinine Ratio: 16 (ref 9–23)
Bilirubin Total: 0.4 mg/dL (ref 0.0–1.2)
CO2: 22 mmol/L (ref 20–29)
Calcium: 9.3 mg/dL (ref 8.7–10.2)
Creatinine, Ser: 0.93 mg/dL (ref 0.57–1.00)
Globulin, Total: 3.2 g/dL (ref 1.5–4.5)
Glucose: 103 mg/dL — ABNORMAL HIGH (ref 70–99)
Potassium: 4.9 mmol/L (ref 3.5–5.2)
Sodium: 135 mmol/L (ref 134–144)
eGFR: 76 mL/min/{1.73_m2} (ref >59)

## 2022-09-27 LAB — CBC
Hematocrit: 37.4 % (ref 34.0–46.6)
Hemoglobin: 11.9 g/dL (ref 11.1–15.9)
Platelets: 582 10*3/uL — ABNORMAL HIGH (ref 150–450)
RBC: 4.24 x10E6/uL (ref 3.77–5.28)
RDW: 12.7 % (ref 11.7–15.4)
WBC: 13.9 10*3/uL — ABNORMAL HIGH (ref 3.4–10.8)

## 2022-09-27 LAB — LIPID PANEL
Cholesterol, Total: 155 mg/dL (ref 100–199)
HDL: 40 mg/dL (ref >39)
LDL Chol Calc (NIH): 101 mg/dL — ABNORMAL HIGH (ref 0–99)

## 2022-09-27 LAB — TSH: TSH: 2.3 u[IU]/mL (ref 0.450–4.500)

## 2022-09-28 ENCOUNTER — Telehealth: Payer: Self-pay

## 2022-09-28 NOTE — Telephone Encounter (Signed)
Patient aware of lab results.

## 2022-09-28 NOTE — Telephone Encounter (Signed)
-----   Message from Corky Crafts, MD sent at 09/28/2022  9:25 AM EDT ----- Normal renal function and electrolytes.  Alkaline phosphatase minimally elevated.  White count mildly elevated.  Platelets mildly elevated.  Hemoglobin normal.  Lipids well-controlled.  Thyroid well-controlled.  Normal hemoglobin A1c.  Would CC primary care doctor for follow-up of labs as well.

## 2022-10-19 LAB — HM MAMMOGRAPHY

## 2022-11-16 ENCOUNTER — Other Ambulatory Visit: Payer: Self-pay | Admitting: Family Medicine

## 2022-11-16 ENCOUNTER — Other Ambulatory Visit: Payer: Self-pay | Admitting: Family

## 2022-11-16 ENCOUNTER — Other Ambulatory Visit: Payer: Self-pay | Admitting: Interventional Cardiology

## 2022-11-16 DIAGNOSIS — R112 Nausea with vomiting, unspecified: Secondary | ICD-10-CM

## 2022-11-16 DIAGNOSIS — G5603 Carpal tunnel syndrome, bilateral upper limbs: Secondary | ICD-10-CM

## 2022-11-16 DIAGNOSIS — I10 Essential (primary) hypertension: Secondary | ICD-10-CM

## 2022-11-17 ENCOUNTER — Other Ambulatory Visit: Payer: Self-pay | Admitting: Family Medicine

## 2022-11-17 DIAGNOSIS — I10 Essential (primary) hypertension: Secondary | ICD-10-CM

## 2022-11-19 LAB — HM PAP SMEAR

## 2022-11-20 ENCOUNTER — Encounter: Payer: Self-pay | Admitting: Family Medicine

## 2022-11-20 ENCOUNTER — Ambulatory Visit: Payer: BC Managed Care – PPO | Admitting: Family Medicine

## 2022-11-20 VITALS — BP 118/76 | HR 85 | Temp 97.9°F | Ht 63.5 in | Wt 244.8 lb

## 2022-11-20 DIAGNOSIS — M461 Sacroiliitis, not elsewhere classified: Secondary | ICD-10-CM | POA: Insufficient documentation

## 2022-11-20 DIAGNOSIS — E782 Mixed hyperlipidemia: Secondary | ICD-10-CM

## 2022-11-20 DIAGNOSIS — D75839 Thrombocytosis, unspecified: Secondary | ICD-10-CM

## 2022-11-20 DIAGNOSIS — R748 Abnormal levels of other serum enzymes: Secondary | ICD-10-CM | POA: Diagnosis not present

## 2022-11-20 DIAGNOSIS — K219 Gastro-esophageal reflux disease without esophagitis: Secondary | ICD-10-CM

## 2022-11-20 DIAGNOSIS — Z6841 Body Mass Index (BMI) 40.0 and over, adult: Secondary | ICD-10-CM

## 2022-11-20 DIAGNOSIS — D72829 Elevated white blood cell count, unspecified: Secondary | ICD-10-CM | POA: Diagnosis not present

## 2022-11-20 DIAGNOSIS — I10 Essential (primary) hypertension: Secondary | ICD-10-CM

## 2022-11-20 LAB — CBC WITH DIFFERENTIAL/PLATELET
Basophils Absolute: 0 10*3/uL (ref 0.0–0.1)
Basophils Relative: 0.3 % (ref 0.0–3.0)
Eosinophils Absolute: 0 10*3/uL (ref 0.0–0.7)
Eosinophils Relative: 0.4 % (ref 0.0–5.0)
HCT: 36.6 % (ref 36.0–46.0)
Hemoglobin: 11.6 g/dL — ABNORMAL LOW (ref 12.0–15.0)
Lymphocytes Relative: 30.7 % (ref 12.0–46.0)
Lymphs Abs: 4 10*3/uL (ref 0.7–4.0)
MCHC: 31.6 g/dL (ref 30.0–36.0)
MCV: 93 fl (ref 78.0–100.0)
Monocytes Absolute: 0.8 10*3/uL (ref 0.1–1.0)
Monocytes Relative: 6.1 % (ref 3.0–12.0)
Neutro Abs: 8.1 10*3/uL — ABNORMAL HIGH (ref 1.4–7.7)
Neutrophils Relative %: 62.5 % (ref 43.0–77.0)
Platelets: 444 10*3/uL — ABNORMAL HIGH (ref 150.0–400.0)
RBC: 3.93 Mil/uL (ref 3.87–5.11)
RDW: 16.1 % — ABNORMAL HIGH (ref 11.5–15.5)
WBC: 12.9 10*3/uL — ABNORMAL HIGH (ref 4.0–10.5)

## 2022-11-20 LAB — HEPATIC FUNCTION PANEL
ALT: 17 U/L (ref 0–35)
AST: 11 U/L (ref 0–37)
Albumin: 3.8 g/dL (ref 3.5–5.2)
Alkaline Phosphatase: 100 U/L (ref 39–117)
Bilirubin, Direct: 0.1 mg/dL (ref 0.0–0.3)
Total Bilirubin: 0.4 mg/dL (ref 0.2–1.2)
Total Protein: 7.1 g/dL (ref 6.0–8.3)

## 2022-11-20 NOTE — Assessment & Plan Note (Signed)
Blood pressure is in good control. Continue carvedilol CR 80 mg daily, lisinopril 40 mg daily, minoxidil 2.5 mg daily, amlodipine 10 mg daily, and spironolactone 25 mg daily.

## 2022-11-20 NOTE — Assessment & Plan Note (Signed)
Maximum weight: 276 lbs (06/2021) Current weight: 244 lbs Weight change since last visit: - 7 lbs Total weight loss: -32 lbs (11.5%)  Ms. [place] is off of Wegovy. I do applaud her efforts to maintain her weight loss. She shoudl continue to remain active.

## 2022-11-20 NOTE — Assessment & Plan Note (Signed)
Lipids at goal. Continue atorvastatin 10 mg daily

## 2022-11-20 NOTE — Progress Notes (Signed)
[facility]IMARY [facility]ARE [facility] [address] [place] [address] Dept: [phone] Dept Fax: [phone]  Chronic Care Office Visit  Subjective:    Patient ID: Tammy Hogan, female    DOB: 01-05-1976, 47 y.o..   MRN: [id number]  Chief Complaint  Patient presents with   Medical Management of Chronic Issues    F/u labs done at cardiologist office.     History of Present Illness:  Patient is in today for reassessment of chronic medical issues.  Tammy Hogan notes she had blood testing performed by her cardiologist in April. She had some abnormal results that he recommended she follow-up with her PCP regarding. She tried doing a Microbiologist and notes this frightened her.  Tammy Hogan has a history of cardiomyopathy with systolic heart failure, hypertension and hyperlipidemia. She is managed on carvedilol CR 80 mg daily, furosemide 20 mg daily, lisinopril 40 mg daily, minoxidil 2.5 mg daily, amlodipine 10 mg daily, a daily 81 mg aspirin and spironolactone 25 mg daily. Tammy Hogan is also on atorvastatin 10 mg daily for lipid management.    Tammy Hogan has a history of obesity. She is no longer on Wegovy, due to the cost. She is workign hard to prevent any weight regain, as she does feel more energetic at her current weight.  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   Gastroesophageal reflux disease 06/20/2016   Mixed hyperlipidemia 05/30/2016   Cervicalgia  05/21/2016   Generalized anxiety disorder 05/15/2016   Arthralgia of multiple joints 05/15/2016   Morbid obesity with BMI of 40.0-44.9, adult (HCC) 05/15/2016   Essential hypertension 09/07/2014   Cardiomyopathy (HCC) 07/02/2012   Systolic heart failure (HCC) 07/02/2012   Past Surgical History:  Procedure Laterality Date   CARDIAC CATHETERIZATION  feb, 2014   Dr Eldridge Dace   FOOT SURGERY  10/2019   ORIF ANKLE FRACTURE Left 07/10/2013   Procedure: OPEN REDUCTION INTERNAL FIXATION (ORIF) LEFT BIMALLEOLAR ANKLE FRACTURE;  Surgeon: Kathryne Hitch, MD;  Location: [facility];  Service: Orthopedics;  Laterality: Left;   Family History  Problem Relation Age of Onset   Hypertension Mother    Hyperlipidemia Mother    Colon polyps Mother    Other Father        drug overdose   Drug abuse Father        overdose   Hypertension Sister    Diabetes Maternal Uncle    Diabetes Maternal Grandmother    Cancer Maternal Grandmother    Heart failure Paternal Grandmother    Hypertension Paternal Grandmother    Diabetes Paternal Grandmother    Glaucoma Paternal Grandmother    Cirrhosis Paternal Grandfather    Alcohol abuse Paternal Grandfather    Cancer Other 56       breast, cervical and colon cancer   Colon cancer Neg Hx    Esophageal cancer Neg Hx    Rectal cancer Neg Hx    Stomach cancer Neg Hx    Outpatient Medications Prior to Visit  Medication Sig Dispense Refill  AMBULATORY NON FORMULARY MEDICATION Medication Name: Diltiazem ointment 2%- apply to anal sphincter 5 times a day as instructed per Dr. Marina Goodell 30 g 3   amLODipine (NORVASC) 10 MG tablet TAKE ONE TABLET BY MOUTH DAILY 90 tablet 3   aspirin 81 MG tablet Take 81 mg by mouth daily.     atorvastatin (LIPITOR) 10 MG tablet TAKE ONE TABLET BY MOUTH DAILY 90 tablet 3   buPROPion (WELLBUTRIN XL) 300 MG 24 hr tablet Take 1 tablet (300 mg total) by mouth daily. 90 tablet 3   busPIRone (BUSPAR) 5 MG tablet TAKE 1 TABLET BY MOUTH DAILY AS  NEEDED 30 tablet 6   carvedilol (COREG CR) 80 MG 24 hr capsule Take 1 capsule (80 mg total) by mouth daily. 90 capsule 3   clindamycin (CLINDAGEL) 1 % gel Apply 1 Application topically 2 (two) times daily.     fluticasone (FLONASE) 50 MCG/ACT nasal spray Place 2 sprays into both nostrils daily. 16 g 0   furosemide (LASIX) 20 MG tablet TAKE 1 TABLET BY MOUTH DAILY 90 tablet 3   gabapentin (NEURONTIN) 300 MG capsule TAKE ONE CAPSULE BY MOUTH AT BEDTIME 90 capsule 3   ibuprofen (ADVIL) 600 MG tablet Take 1 tablet (600 mg total) by mouth every 6 (six) hours as needed. 30 tablet 0   levonorgestrel (MIRENA) 20 MCG/24HR IUD 1 each by Intrauterine route once.     lisinopril (ZESTRIL) 40 MG tablet TAKE ONE TABLET BY MOUTH DAILY 90 tablet 3   minoxidil (LONITEN) 2.5 MG tablet Take by mouth.     nitroGLYCERIN (NITROSTAT) 0.4 MG SL tablet Place 1 tablet (0.4 mg total) under the tongue every 5 (five) minutes as needed for chest pain. 25 tablet 3   nystatin cream (MYCOSTATIN) Apply 1 Application topically 2 (two) times daily.     ondansetron (ZOFRAN) 4 MG tablet Take 1 tablet (4 mg total) by mouth every 8 (eight) hours as needed for nausea or vomiting. 30 tablet 0   pantoprazole (PROTONIX) 40 MG tablet TAKE ONE TABLET BY MOUTH DAILY 90 tablet 3   spironolactone (ALDACTONE) 25 MG tablet Take 1 tablet (25 mg total) by mouth daily. 90 tablet 3   finasteride (PROSCAR) 5 MG tablet Take 1 mg by mouth daily.     ibuprofen (ADVIL) 400 MG tablet Take 1 tablet (400 mg total) by mouth every 8 (eight) hours as needed for up to 30 doses. 30 tablet 0   clobetasol (TEMOVATE) 0.05 % external solution Apply topically. (Patient not taking: Reported on 11/20/2022)     fluconazole (DIFLUCAN) 150 MG tablet Take 1 tablet on day 4 of antibiotics.  Take second tablet 3 days later. 2 tablet 0   Semaglutide-Weight Management (WEGOVY) 0.25 MG/0.5ML SOAJ Inject 0.25 mg into the skin once a week. (Patient not taking: Reported on 09/18/2022) 2  mL 0   WEGOVY 1.7 MG/0.75ML SOAJ INJECT 1.7 MG UNDER THE SKIN ONCE WEEKLY (Patient not taking: Reported on 09/18/2022) 3 mL 5   No facility-administered medications prior to visit.   Allergies  Allergen Reactions   Percocet [Oxycodone-Acetaminophen] Nausea Only   Hydrocodone-Acetaminophen Nausea And Vomiting   Objective:   Today's Vitals   11/20/22 0807  BP: 118/76  Pulse: 85  Temp: 97.9 F (36.6 C)  TempSrc: Temporal  SpO2: 100%  Weight: 244 lb 12.8 oz (111 kg)  Height: 5' 3.5" (1.613 m)   Body mass index is 42.68 kg/m.   General: Well developed, well nourished. No acute distress.  Psych: Alert and oriented. Normal mood and affect.  Health Maintenance Due  Topic Date Due   PAP SMEAR-Modifier  06/11/2022   Lab Results    Latest Ref Rng & Units 09/26/2022   11:33 AM 08/05/2019    5:05 PM 03/11/2017    9:50 AM  CBC  WBC 3.4 - 10.8 x10E3/uL 13.9  11.2  8.1   Hemoglobin 11.1 - 15.9 g/dL 16.1  09.6  04.5   Hematocrit 34.0 - 46.6 % 37.4  39.5  38.9   Platelets 150 - 450 x10E3/uL 582  444  431       Latest Ref Rng & Units 09/26/2022   11:33 AM 05/10/2021    8:13 AM 08/05/2019    5:05 PM  CMP  Glucose 70 - 99 mg/dL 409  811  914   BUN 6 - 24 mg/dL 15  13  11    Creatinine 0.57 - 1.00 mg/dL 7.82  9.56  2.13   Sodium 134 - 144 mmol/L 135  139  136   Potassium 3.5 - 5.2 mmol/L 4.9  3.8  4.4   Chloride 96 - 106 mmol/L 98  104  98   CO2 20 - 29 mmol/L 22  26  22    Calcium 8.7 - 10.2 mg/dL 9.3  9.4  9.4   Total Protein 6.0 - 8.5 g/dL 7.3     Total Bilirubin 0.0 - 1.2 mg/dL 0.4     Alkaline Phos 44 - 121 IU/L 141     AST 0 - 40 IU/L 10     ALT 0 - 32 IU/L 15      Assessment & Plan:   Problem List Items Addressed This Visit       Cardiovascular and Mediastinum   Essential hypertension    Blood pressure is in good control. Continue carvedilol CR 80 mg daily, lisinopril 40 mg daily, minoxidil 2.5 mg daily, amlodipine 10 mg daily, and spironolactone 25 mg daily.          Digestive   Gastroesophageal reflux disease    Tammy Hogan notes she still gets some breakthrough heartburn at times, esp. at work. She could increase her pantoprazole to 80 mg on the days this flares.        Other   Morbid obesity with BMI of 40.0-44.9, adult (HCC)    Maximum weight: 276 lbs (06/2021) Current weight: 244 lbs Weight change since last visit: - 7 lbs Total weight loss: -32 lbs (11.5%)  Tammy Hogan is off of Wegovy. I do applaud her efforts to maintain her weight loss. She shoudl continue to remain active.      Mixed hyperlipidemia    Lipids at goal. Continue atorvastatin 10 mg daily       Other Visit Diagnoses     Elevated alkaline phosphatase level    -  Primary   Mild, long-standing elevation. I will repeat her LFTs to include a GGT for further assessment. If this remains elevated, I would consider a RUQ ultrasound.   Relevant Orders   Hepatic function panel   Leukocytosis, unspecified type       Recurrent mild elevation. Likely related to obesity. I will repeat this.   Relevant Orders   CBC with Differential/Platelet   Thrombocytosis       Recurrent, mild elevation. Likely reactive.   Relevant Orders   CBC with Differential/Platelet       Return in about 3 months (around 02/20/2023) for Reassessment.   Loyola Mast,  MD

## 2022-11-20 NOTE — Assessment & Plan Note (Signed)
Tammy Hogan notes she still gets some breakthrough heartburn at times, esp. at work. She could increase her pantoprazole to 80 mg on the days this flares.

## 2022-11-21 ENCOUNTER — Encounter: Payer: Self-pay | Admitting: Family Medicine

## 2022-11-22 ENCOUNTER — Other Ambulatory Visit: Payer: Self-pay | Admitting: Interventional Cardiology

## 2022-11-22 DIAGNOSIS — I10 Essential (primary) hypertension: Secondary | ICD-10-CM

## 2022-12-20 DIAGNOSIS — K219 Gastro-esophageal reflux disease without esophagitis: Secondary | ICD-10-CM | POA: Insufficient documentation

## 2022-12-21 ENCOUNTER — Telehealth: Payer: BC Managed Care – PPO | Admitting: Family Medicine

## 2022-12-21 DIAGNOSIS — L0291 Cutaneous abscess, unspecified: Secondary | ICD-10-CM

## 2022-12-21 MED ORDER — CEPHALEXIN 500 MG PO CAPS: 500.0000 mg | ORAL_CAPSULE | Freq: Three times a day (TID) | ORAL | 0 refills | Status: AC

## 2022-12-21 MED ORDER — FLUCONAZOLE 150 MG PO TABS: 150.0000 mg | ORAL_TABLET | Freq: Once | ORAL | 0 refills | Status: AC

## 2022-12-21 NOTE — Addendum Note (Signed)
Addended by: Georgana Curio on: 12/21/2022 02:51 PM   Modules accepted: Orders

## 2022-12-21 NOTE — Progress Notes (Signed)
E Visit for Rash  We are sorry that you are not feeling well. Here is how we plan to help!   Keflex 500 mg three times per day for 7 days   HOME CARE:  Take cool showers and avoid direct sunlight. Apply cool compress or wet dressings. Take a bath in an oatmeal bath.  Sprinkle content of one Aveeno packet under running faucet with comfortably warm water.  Bathe for 15-20 minutes, 1-2 times daily.  Pat dry with a towel. Do not rub the rash. Use hydrocortisone cream. Take an antihistamine like Benadryl for widespread rashes that itch.  The adult dose of Benadryl is 25-50 mg by mouth 4 times daily. Caution:  This type of medication may cause sleepiness.  Do not drink alcohol, drive, or operate dangerous machinery while taking antihistamines.  Do not take these medications if you have prostate enlargement.  Read package instructions thoroughly on all medications that you take.  GET HELP RIGHT AWAY IF:  Symptoms don't go away after treatment. Severe itching that persists. If you rash spreads or swells. If you rash begins to smell. If it blisters and opens or develops a yellow-brown crust. You develop a fever. You have a sore throat. You become short of breath.  MAKE SURE YOU:  Understand these instructions. Will watch your condition. Will get help right away if you are not doing well or get worse.  Thank you for choosing an e-visit.  Your e-visit answers were reviewed by a board certified advanced clinical practitioner to complete your personal care plan. Depending upon the condition, your plan could have included both over the counter or prescription medications.  Please review your pharmacy choice. Make sure the pharmacy is open so you can pick up prescription now. If there is a problem, you may contact your provider through [employer] and have the prescription routed to another pharmacy.  Your safety is important to [place]. If you have drug allergies check your prescription  carefully.   For the next 24 hours you can use MyChart to ask questions about today's visit, request a non-urgent call back, or ask for a work or school excuse. You will get an email in the next two days asking about your experience. I hope that your e-visit has been valuable and will speed your recovery.   have provided 5 minutes of non face to face time during this encounter for chart review and documentation.

## 2023-02-18 ENCOUNTER — Other Ambulatory Visit: Payer: Self-pay | Admitting: Family Medicine

## 2023-02-18 DIAGNOSIS — F411 Generalized anxiety disorder: Secondary | ICD-10-CM

## 2023-02-20 ENCOUNTER — Other Ambulatory Visit: Payer: Self-pay | Admitting: Family Medicine

## 2023-02-20 DIAGNOSIS — F411 Generalized anxiety disorder: Secondary | ICD-10-CM

## 2023-03-01 ENCOUNTER — Encounter: Payer: Self-pay | Admitting: Family Medicine

## 2023-03-04 NOTE — Telephone Encounter (Signed)
Patient was over due for on appt so scheduled her for 03/08/23 @1 :40 pm.  Dm/cma

## 2023-03-07 ENCOUNTER — Encounter: Payer: Self-pay | Admitting: Physician Assistant

## 2023-03-07 ENCOUNTER — Telehealth: Payer: BC Managed Care – PPO | Admitting: Physician Assistant

## 2023-03-07 DIAGNOSIS — L732 Hidradenitis suppurativa: Secondary | ICD-10-CM | POA: Diagnosis not present

## 2023-03-07 MED ORDER — DOXYCYCLINE HYCLATE 100 MG PO TABS
100.0000 mg | ORAL_TABLET | Freq: Two times a day (BID) | ORAL | 0 refills | Status: AC
Start: 2023-03-07 — End: ?

## 2023-03-07 NOTE — Progress Notes (Signed)
I have spent 5 minutes in review of e-visit questionnaire, review and updating patient chart, medical decision making and response to patient.   Mia Milan Cody Jacklynn Dehaas, PA-C    

## 2023-03-07 NOTE — Progress Notes (Signed)
E Visit for Cellulitis  We are sorry that you are not feeling well. Here is how we plan to help!  Based on what you shared with me it looks like you have cellulitis/abscess due to a flare of your HS.   I have prescribed:  Doxycycline 100 mg twice daily for 7 days.  Try to keep the area clean and dry. Warm compresses at home, when able to help promote drainage.  HOME CARE:  Take your medications as ordered and take all of them, even if the skin irritation appears to be healing.   GET HELP RIGHT AWAY IF:  Symptoms that don't begin to go away within 48 hours. Severe redness persists or worsens If the area turns color, spreads or swells. If it blisters and opens, develops yellow-brown crust or bleeds. You develop a fever or chills. If the pain increases or becomes unbearable.  Are unable to keep fluids and food down.  MAKE SURE YOU   Understand these instructions. Will watch your condition. Will get help right away if you are not doing well or get worse.  Thank you for choosing an e-visit.  Your e-visit answers were reviewed by a board certified advanced clinical practitioner to complete your personal care plan. Depending upon the condition, your plan could have included both over the counter or prescription medications.  Please review your pharmacy choice. Make sure the pharmacy is open so you can pick up prescription now. If there is a problem, you may contact your provider through [employer] and have the prescription routed to another pharmacy.  Your safety is important to [place]. If you have drug allergies check your prescription carefully.   For the next 24 hours you can use MyChart to ask questions about today's visit, request a non-urgent call back, or ask for a work or school excuse. You will get an email in the next two days asking about your experience. I hope that your e-visit has been valuable and will speed your recovery.

## 2023-03-08 ENCOUNTER — Ambulatory Visit: Payer: BC Managed Care – PPO | Admitting: Family Medicine

## 2023-03-08 VITALS — BP 132/84 | HR 97 | Temp 97.6°F | Ht 63.5 in | Wt 256.4 lb

## 2023-03-08 DIAGNOSIS — Z6841 Body Mass Index (BMI) 40.0 and over, adult: Secondary | ICD-10-CM

## 2023-03-08 DIAGNOSIS — R232 Flushing: Secondary | ICD-10-CM

## 2023-03-08 DIAGNOSIS — I5022 Chronic systolic (congestive) heart failure: Secondary | ICD-10-CM

## 2023-03-08 DIAGNOSIS — I10 Essential (primary) hypertension: Secondary | ICD-10-CM

## 2023-03-08 DIAGNOSIS — R112 Nausea with vomiting, unspecified: Secondary | ICD-10-CM

## 2023-03-08 DIAGNOSIS — F411 Generalized anxiety disorder: Secondary | ICD-10-CM

## 2023-03-08 MED ORDER — ESCITALOPRAM OXALATE 10 MG PO TABS: 10.0000 mg | ORAL_TABLET | Freq: Every day | ORAL | 3 refills | Status: DC

## 2023-03-08 MED ORDER — ONDANSETRON HCL 4 MG PO TABS
4.0000 mg | ORAL_TABLET | Freq: Three times a day (TID) | ORAL | 0 refills | Status: DC | PRN
Start: 2023-03-08 — End: 2023-04-03

## 2023-03-08 MED ORDER — BUPROPION HCL ER (XL) 150 MG PO TB24: 150.0000 mg | ORAL_TABLET | Freq: Every day | ORAL | 0 refills | Status: DC

## 2023-03-08 NOTE — Assessment & Plan Note (Signed)
Blood pressure is in adequate control. Continue carvedilol CR 80 mg daily, lisinopril 40 mg daily, minoxidil 2.5 mg daily, amlodipine 10 mg daily, and spironolactone 25 mg daily.

## 2023-03-08 NOTE — Assessment & Plan Note (Signed)
Compensated. Continue carvedilol CR 80 mg daily, furosemide 20 mg daily, lisinopril 40 mg daily, and spironolactone 25 mg daily.

## 2023-03-08 NOTE — Patient Instructions (Signed)
Stop taking bupropion XL (Wellbutrin) 300 mg. Take bupropion XL (Wellbutrin) 150 mg daily for 1 week, then stop. Start escitalopram (Lexapro) 10 mg daily after stopping bupropion.

## 2023-03-08 NOTE — Assessment & Plan Note (Signed)
Maximum weight: 276 lbs (06/2021) Current weight: 256 lbs Weight change since last visit:+ 12 lbs Total weight loss: -20 lbs (7.2%)  Tammy Hogan is regaining weight now that she is off of Wegovy. I will refer her to the healthy Weight program to see if they can assist her in continued weight loss.

## 2023-03-08 NOTE — Assessment & Plan Note (Signed)
[name] has been on bupropion. This was originally prescribed to help with smoking cessation. She has been off of cigarettes for 2 years now. She has not seen any significant weight loss. She agrees this is not helping her mood. I will taper her off of the bupropion and plan to start her on escitalopram, as this worked well for her in the past. I will also refer her for counseling.

## 2023-03-08 NOTE — Progress Notes (Signed)
[facility] [address] [place] [address] Dept: [phone] Dept Fax: [phone]  Chronic Care Office Visit  Subjective:    Patient ID: Tammy Hogan, female    DOB: 02-13-1976, 47 y.o..   MRN: [id number]  Chief Complaint  Patient presents with   Follow-up    F/u meds.  C/o having dry itchy skin and hot flashes.  Not sleeping well and unable to stay focused. [name]    History of Present Illness:  Patient is in today for reassessment of chronic medical issues.  Ms. Willa Rough has a history of cardiomyopathy with systolic heart failure, hypertension and hyperlipidemia. She is managed on carvedilol CR 80 mg daily, furosemide 20 mg daily, lisinopril 40 mg daily, minoxidil 2.5 mg daily, amlodipine 10 mg daily, a daily 81 mg aspirin and spironolactone 25 mg daily. Ms. Alver Sorrow is also on atorvastatin 10 mg daily for lipid management. She denies any dyspnea or pedal edema.    Ms. Willa Rough has a history of obesity. She is no longer on Wegovy, due to the cost. She admits to depressive feelings about herself and her weight. She finds she does binge eat at times. She is having difficulty with sleep, as she has racing thoughts in her head.  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   Gastroesophageal reflux disease 06/20/2016   Mixed hyperlipidemia 05/30/2016   Cervicalgia 05/21/2016   Generalized anxiety  disorder 05/15/2016   Arthralgia of multiple joints 05/15/2016   Morbid obesity with BMI of 40.0-44.9, adult (HCC) 05/15/2016   Essential hypertension 09/07/2014   Cardiomyopathy (HCC) 07/02/2012   Systolic heart failure (HCC) 07/02/2012   Past Surgical History:  Procedure Laterality Date   CARDIAC CATHETERIZATION  feb, 2014   Dr Eldridge Dace   FOOT SURGERY  10/2019   ORIF ANKLE FRACTURE Left 07/10/2013   Procedure: OPEN REDUCTION INTERNAL FIXATION (ORIF) LEFT BIMALLEOLAR ANKLE FRACTURE;  Surgeon: Kathryne Hitch, MD;  Location: [facility];  Service: Orthopedics;  Laterality: Left;   Family History  Problem Relation Age of Onset   Hypertension Mother    Hyperlipidemia Mother    Colon polyps Mother    Other Father        drug overdose   Drug abuse Father        overdose   Hypertension Sister    Diabetes Maternal Uncle    Diabetes Maternal Grandmother    Cancer Maternal Grandmother    Heart failure Paternal Grandmother    Hypertension Paternal Grandmother    Diabetes Paternal Grandmother    Glaucoma Paternal Grandmother    Cirrhosis Paternal Grandfather    Alcohol abuse Paternal Grandfather    Cancer Other 19       breast, cervical and colon cancer   Colon cancer Neg Hx    Esophageal cancer Neg Hx    Rectal cancer Neg Hx    Stomach cancer Neg Hx    Outpatient Medications Prior to Visit  Medication Sig Dispense Refill   AMBULATORY NON  FORMULARY MEDICATION Medication Name: Diltiazem ointment 2%- apply to anal sphincter 5 times a day as instructed per Dr. Marina Goodell 30 g 3   amLODipine (NORVASC) 10 MG tablet TAKE ONE TABLET BY MOUTH DAILY 90 tablet 3   aspirin 81 MG tablet Take 81 mg by mouth daily.     atorvastatin (LIPITOR) 10 MG tablet TAKE ONE TABLET BY MOUTH DAILY 90 tablet 3   busPIRone (BUSPAR) 5 MG tablet TAKE 1 TABLET BY MOUTH DAILY AS NEEDED 30 tablet 6   carvedilol (COREG CR) 80 MG 24 hr capsule Take 1 capsule (80 mg total) by mouth daily. 90 capsule 3   clindamycin  (CLINDAGEL) 1 % gel Apply 1 Application topically 2 (two) times daily.     clobetasol (TEMOVATE) 0.05 % external solution Apply topically.     fexofenadine (ALLEGRA) 180 MG tablet Take 180 mg by mouth daily.     furosemide (LASIX) 20 MG tablet TAKE 1 TABLET BY MOUTH DAILY 90 tablet 3   gabapentin (NEURONTIN) 300 MG capsule TAKE ONE CAPSULE BY MOUTH AT BEDTIME 90 capsule 3   levonorgestrel (MIRENA) 20 MCG/24HR IUD 1 each by Intrauterine route once.     lisinopril (ZESTRIL) 40 MG tablet TAKE ONE TABLET BY MOUTH DAILY 90 tablet 3   minoxidil (LONITEN) 2.5 MG tablet Take by mouth.     nitroGLYCERIN (NITROSTAT) 0.4 MG SL tablet Place 1 tablet (0.4 mg total) under the tongue every 5 (five) minutes as needed for chest pain. 25 tablet 3   nystatin cream (MYCOSTATIN) Apply 1 Application topically 2 (two) times daily.     ondansetron (ZOFRAN) 4 MG tablet Take 1 tablet (4 mg total) by mouth every 8 (eight) hours as needed for nausea or vomiting. 30 tablet 0   pantoprazole (PROTONIX) 40 MG tablet TAKE ONE TABLET BY MOUTH DAILY 90 tablet 3   spironolactone (ALDACTONE) 25 MG tablet Take 1 tablet (25 mg total) by mouth daily. 90 tablet 3   buPROPion (WELLBUTRIN XL) 300 MG 24 hr tablet Take 1 tablet (300 mg total) by mouth daily. 90 tablet 3   ibuprofen (ADVIL) 600 MG tablet Take 1 tablet (600 mg total) by mouth every 6 (six) hours as needed. 30 tablet 0   doxycycline (VIBRA-TABS) 100 MG tablet Take 1 tablet (100 mg total) by mouth 2 (two) times daily. (Patient not taking: Reported on 03/08/2023) 14 tablet 0   fluticasone (FLONASE) 50 MCG/ACT nasal spray Place 2 sprays into both nostrils daily. (Patient not taking: Reported on 03/08/2023) 16 g 0   No facility-administered medications prior to visit.   Allergies  Allergen Reactions   Percocet [Oxycodone-Acetaminophen] Nausea Only   Hydrocodone-Acetaminophen Nausea And Vomiting   Objective:   Today's Vitals   03/08/23 1321  BP: 132/84  Pulse: 97  Temp:  97.6 F (36.4 C)  TempSrc: Temporal  SpO2: 100%  Weight: 256 lb 6.4 oz (116.3 kg)  Height: 5' 3.5" (1.613 m)   Body mass index is 44.71 kg/m.   General: Well developed, well nourished. No acute distress. Psych: Alert and oriented. Depressed mood and affect. Some tearfulness.  There are no preventive care reminders to display for this patient.    Assessment & Plan:   Problem List Items Addressed This Visit       Cardiovascular and Mediastinum   Essential hypertension    Blood pressure is in adequate control. Continue carvedilol CR 80 mg daily, lisinopril 40 mg daily, minoxidil 2.5 mg daily, amlodipine 10 mg daily, and  spironolactone 25 mg daily.       Systolic heart failure (HCC) - Primary    Compensated. Continue carvedilol CR 80 mg daily, furosemide 20 mg daily, lisinopril 40 mg daily, and spironolactone 25 mg daily.        Other   Generalized anxiety disorder    Ms. Alver Sorrow has been on bupropion. This was originally prescribed to help with smoking cessation. She has been off of cigarettes for 2 years now. She has not seen any significant weight loss. She agrees this is not helping her mood. I will taper her off of the bupropion and plan to start her on escitalopram, as this worked well for her in the past. I will also refer her for counseling.      Relevant Medications   buPROPion (WELLBUTRIN XL) 150 MG 24 hr tablet   escitalopram (LEXAPRO) 10 MG tablet   Other Relevant Orders   Ambulatory referral to Psychology   Morbid obesity with BMI of 40.0-44.9, adult (HCC)    Maximum weight: 276 lbs (06/2021) Current weight: 256 lbs Weight change since last visit:+ 12 lbs Total weight loss: -20 lbs (7.2%)  Ms. Mailt[place] is regaining weight now that she is off of Wegovy. I will refer her to the healthy Weight program to see if they can assist her in continued weight loss.      Relevant Orders   Amb Ref to Medical Weight Management   Other Visit Diagnoses     Hot flashes        Patient notes hot flashes effecting sleep. I will check an FSH/LH. Discussed some nonpharmacologic options for management. Will see how SSRI helps.   Relevant Orders   FSH/LH   Nausea and vomiting, unspecified vomiting type       Possibly due to anxiety/stress. I will renew Zofran.   Relevant Medications   ondansetron (ZOFRAN) 4 MG tablet       Return in about 6 weeks (around 04/19/2023) for Reassessment.   Loyola Mast, MD

## 2023-03-09 LAB — FSH/LH
FSH: 27.4 m[IU]/mL
LH: 8.4 m[IU]/mL

## 2023-03-11 ENCOUNTER — Other Ambulatory Visit: Payer: Self-pay | Admitting: Family Medicine

## 2023-03-11 DIAGNOSIS — F411 Generalized anxiety disorder: Secondary | ICD-10-CM

## 2023-04-03 ENCOUNTER — Other Ambulatory Visit: Payer: Self-pay | Admitting: Family Medicine

## 2023-04-03 DIAGNOSIS — R112 Nausea with vomiting, unspecified: Secondary | ICD-10-CM

## 2023-04-09 ENCOUNTER — Ambulatory Visit (INDEPENDENT_AMBULATORY_CARE_PROVIDER_SITE_OTHER): Payer: BC Managed Care – PPO | Admitting: Psychology

## 2023-04-09 DIAGNOSIS — F331 Major depressive disorder, recurrent, moderate: Secondary | ICD-10-CM | POA: Diagnosis not present
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
## 2023-04-10 ENCOUNTER — Other Ambulatory Visit: Payer: Self-pay

## 2023-04-10 ENCOUNTER — Ambulatory Visit: Payer: BC Managed Care – PPO | Admitting: Family Medicine

## 2023-04-10 VITALS — BP 136/82 | HR 79 | Ht 63.5 in | Wt 261.0 lb

## 2023-04-10 DIAGNOSIS — M7712 Lateral epicondylitis, left elbow: Secondary | ICD-10-CM | POA: Diagnosis not present

## 2023-04-10 DIAGNOSIS — M25562 Pain in left knee: Secondary | ICD-10-CM

## 2023-04-10 DIAGNOSIS — G8929 Other chronic pain: Secondary | ICD-10-CM | POA: Diagnosis not present

## 2023-04-10 NOTE — Patient Instructions (Addendum)
Thank you for coming in today.   You received an injection today. Seek immediate medical attention if the joint becomes red, extremely painful, or is oozing fluid.   Let me know how it goes.  

## 2023-04-10 NOTE — Progress Notes (Signed)
Rubin Payor, PhD, LAT, ATC acting as a scribe for Clementeen Graham, MD.  Tammy Hogan is a 47 y.o. female who presents to [facility] today for L elbow and knee pain. Pt was previously seen by Dr. Denyse Amass on 09/12/22 for LBP.  Today, pt c/o L elbow pain x a couple wks. She was carrying some groceries that exacerbated the pain. Pt locates pain to the lateral aspect of her L elbow w/ radiating pain into the forearm. She is RHD.  Radiates: yes Paresthesia: no Grip strength: normal, but painful Aggravates: gripping motions Treatments tried: IBU  Pt also c/o L knee pain flared up about a month ago. Pt locates pain to the anterior aspect of her L knee  Swelling: no Mechanical symptoms: no  Pertinent review of systems: no fever or chills  Relevant historical information: HTN Sacroiliitis  Exam:  BP 136/82   Pulse 79   Ht 5' 3.5" (1.613 m)   Wt 261 lb (118.4 kg)   SpO2 97%   BMI 45.51 kg/m  General: Well Developed, well nourished, and in no acute distress.   MSK: Left elbow normal-appearing Normal motion. Intact strength. Tender palpation lateral epicondyle.  Pain with resisted wrist extension.  Left knee minimal effusion normal-appearing otherwise. Normal motion. Tender palpation medial joint line. Intact strength.    Lab and Radiology Results  Procedure: Real-time Ultrasound Guided Injection of left lateral epicondyle common extensor tendon origin Device: Philips Affiniti 50G/GE Logiq Images permanently stored and available for review in PACS Verbal informed consent obtained.  Discussed risks and benefits of procedure. Warned about infection, bleeding, hyperglycemia damage to structures among others. Patient expresses understanding and agreement Time-out conducted.   Noted no overlying erythema, induration, or other signs of local infection.   Skin prepped in a sterile fashion.   Local anesthesia: Topical Ethyl chloride.    With sterile technique and under real time ultrasound guidance: 40 mg of Kenalog and 1 mL of Marcaine injected into lateral epicondyle. Fluid seen entering the common extensor tendon origin.   Completed without difficulty   Pain immediately resolved suggesting accurate placement of the medication.   Advised to call if fevers/chills, erythema, induration, drainage, or persistent bleeding.   Images permanently stored and available for review in the ultrasound unit.  Impression: Technically successful ultrasound guided injection.    Procedure: Real-time Ultrasound Guided Injection of left knee joint superior lateral patella space Device: Philips Affiniti 50G/GE Logiq Images permanently stored and available for review in PACS Verbal informed consent obtained.  Discussed risks and benefits of procedure. Warned about infection, bleeding, hyperglycemia damage to structures among others. Patient expresses understanding and agreement Time-out conducted.   Noted no overlying erythema, induration, or other signs of local infection.   Skin prepped in a sterile fashion.   Local anesthesia: Topical Ethyl chloride.   With sterile technique and under real time ultrasound guidance: 40 mg of Kenalog and 2 mL of Marcaine injected into knee joint. Fluid seen entering the joint capsule.   Completed without difficulty   Pain immediately resolved suggesting accurate placement of the medication.   Advised to call if fevers/chills, erythema, induration, drainage, or persistent bleeding.   Images permanently stored and available for review in the ultrasound unit.  Impression: Technically successful ultrasound guided injection.      Assessment and Plan: 46 y.o. 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  She has had this problem in the past before and  did well with an injection.  She would like to proceed to injection today.  Will go ahead and do that now and recommend occupational  therapy in the future if needed.  Also recommend Voltaren gel and a counterforce strap.  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  She had an x-ray of her left knee 4 years ago that showed some arthritis.  Plan for steroid injection and Voltaren gel as well.   PDMP not reviewed this encounter. Orders Placed This Encounter  Procedures   [place] LIMITED JOINT SPACE STRUCTURES LOW LEFT(NO LINKED CHARGES)    Order Specific Question:   Reason for Exam (SYMPTOM  OR DIAGNOSIS REQUIRED)    Answer:   left knee pain    Order Specific Question:   Preferred imaging location?    Answer:    [facility]   No orders of the defined types were placed in this encounter.    Discussed warning signs or symptoms. Please see discharge instructions. Patient expresses understanding.   The above documentation has been reviewed and is accurate and complete Clementeen Graham, M.D.

## 2023-04-19 ENCOUNTER — Ambulatory Visit: Payer: BC Managed Care – PPO | Admitting: Family Medicine

## 2023-04-19 ENCOUNTER — Encounter: Payer: Self-pay | Admitting: Family Medicine

## 2023-04-19 VITALS — BP 126/74 | HR 76 | Temp 97.8°F | Ht 63.5 in | Wt 256.4 lb

## 2023-04-19 DIAGNOSIS — I5022 Chronic systolic (congestive) heart failure: Secondary | ICD-10-CM | POA: Diagnosis not present

## 2023-04-19 DIAGNOSIS — B370 Candidal stomatitis: Secondary | ICD-10-CM | POA: Diagnosis not present

## 2023-04-19 DIAGNOSIS — I10 Essential (primary) hypertension: Secondary | ICD-10-CM

## 2023-04-19 DIAGNOSIS — F411 Generalized anxiety disorder: Secondary | ICD-10-CM

## 2023-04-19 MED ORDER — NYSTATIN 100000 UNIT/ML MT SUSP: 5.0000 mL | Freq: Four times a day (QID) | OROMUCOSAL | 0 refills | Status: DC

## 2023-04-19 NOTE — Assessment & Plan Note (Addendum)
Blood pressure is in good control. Continue carvedilol CR 80 mg daily, lisinopril 40 mg daily, minoxidil 2.5 mg daily, amlodipine 10 mg daily, and spironolactone 25 mg daily.

## 2023-04-19 NOTE — Assessment & Plan Note (Signed)
Compensated. Continue carvedilol CR 80 mg daily, furosemide 20 mg daily, lisinopril 40 mg daily, and spironolactone 25 mg daily.

## 2023-04-19 NOTE — Progress Notes (Signed)
[facility] [address] [place] [address] Dept: [phone] Dept Fax: [phone]  Chronic Care Office Visit  Subjective:    Patient ID: Tammy Hogan, female    DOB: 1976/02/07, 47 y.o..   MRN: [id number]  Chief Complaint  Patient presents with   Follow-up    6 week f/u.  Wants FMLA paperwork filled out   History of Present Illness:  Patient is in today for reassessment of chronic medical issues.  [name] has a history of cardiomyopathy with systolic heart failure, hypertension and hyperlipidemia. She is managed on carvedilol CR 80 mg daily, furosemide 20 mg daily, lisinopril 40 mg daily, minoxidil 2.5 mg daily, amlodipine 10 mg daily, a daily 81 mg aspirin and spironolactone 25 mg daily. [name] is also on atorvastatin 10 mg daily for lipid management. She denies any dyspnea or pedal edema.   At her last visit, [name] was having issues with increased anxiety. We tapered her off of bupropion and started her on escitalopram. She is feeling much better at this point. She is also engaging in counseling, which she feels has helped.  [name] notes an issue with a white exudate on her tongue and discomfort in her mouth and upper throat over the past 1-2 weeks.  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   Gastroesophageal reflux disease 06/20/2016   Mixed hyperlipidemia  05/30/2016   Cervicalgia 05/21/2016   Generalized anxiety disorder 05/15/2016   Arthralgia of multiple joints 05/15/2016   Morbid obesity with BMI of 40.0-44.9, adult (HCC) 05/15/2016   Essential hypertension 09/07/2014   Cardiomyopathy (HCC) 07/02/2012   Systolic heart failure (HCC) 07/02/2012   Past Surgical History:  Procedure Laterality Date   CARDIAC CATHETERIZATION  feb, 2014   Dr Eldridge Dace   FOOT SURGERY  10/2019   ORIF ANKLE FRACTURE Left 07/10/2013   Procedure: OPEN REDUCTION INTERNAL FIXATION (ORIF) LEFT BIMALLEOLAR ANKLE FRACTURE;  Surgeon: Kathryne Hitch, MD;  Location: [facility];  Service: Orthopedics;  Laterality: Left;   Family History  Problem Relation Age of Onset   Hypertension Mother    Hyperlipidemia Mother    Colon polyps Mother    Other Father        drug overdose   Drug abuse Father        overdose   Hypertension Sister    Diabetes Maternal Uncle    Diabetes Maternal Grandmother    Cancer Maternal Grandmother    Heart failure Paternal Grandmother    Hypertension Paternal Grandmother    Diabetes Paternal Grandmother    Glaucoma Paternal Grandmother    Cirrhosis Paternal Grandfather    Alcohol abuse Paternal Grandfather    Cancer Other 60       breast, cervical and colon cancer   Colon cancer Neg Hx    Esophageal cancer Neg Hx    Rectal cancer Neg Hx    Stomach cancer Neg Hx    Outpatient Medications Prior to Visit  Medication Sig Dispense  Refill   AMBULATORY NON FORMULARY MEDICATION Medication Name: Diltiazem ointment 2%- apply to anal sphincter 5 times a day as instructed per Dr. Marina Goodell 30 g 3   amLODipine (NORVASC) 10 MG tablet TAKE ONE TABLET BY MOUTH DAILY 90 tablet 3   aspirin 81 MG tablet Take 81 mg by mouth daily.     atorvastatin (LIPITOR) 10 MG tablet TAKE ONE TABLET BY MOUTH DAILY 90 tablet 3   carvedilol (COREG CR) 80 MG 24 hr capsule Take 1 capsule (80 mg total) by mouth daily. 90 capsule 3   clindamycin (CLINDAGEL) 1 % gel Apply 1  Application topically 2 (two) times daily.     clobetasol (TEMOVATE) 0.05 % external solution Apply topically.     escitalopram (LEXAPRO) 10 MG tablet Take 1 tablet (10 mg total) by mouth daily. 90 tablet 3   fexofenadine (ALLEGRA) 180 MG tablet Take 180 mg by mouth daily.     furosemide (LASIX) 20 MG tablet TAKE 1 TABLET BY MOUTH DAILY 90 tablet 3   gabapentin (NEURONTIN) 300 MG capsule TAKE ONE CAPSULE BY MOUTH AT BEDTIME 90 capsule 3   hydrOXYzine (ATARAX) 25 MG tablet Take 25 mg by mouth 3 (three) times daily.     levonorgestrel (MIRENA) 20 MCG/24HR IUD 1 each by Intrauterine route once.     lisinopril (ZESTRIL) 40 MG tablet TAKE ONE TABLET BY MOUTH DAILY 90 tablet 3   minoxidil (LONITEN) 2.5 MG tablet Take by mouth.     nitroGLYCERIN (NITROSTAT) 0.4 MG SL tablet Place 1 tablet (0.4 mg total) under the tongue every 5 (five) minutes as needed for chest pain. 25 tablet 3   nystatin cream (MYCOSTATIN) Apply 1 Application topically 2 (two) times daily.     ondansetron (ZOFRAN) 4 MG tablet TAKE 1 TABLET BY MOUTH EVERY 8 HOURS AS NEEDED FOR NAUSEA AND/OR VOMITING 18 tablet 0   pantoprazole (PROTONIX) 40 MG tablet TAKE ONE TABLET BY MOUTH DAILY 90 tablet 3   spironolactone (ALDACTONE) 25 MG tablet Take 1 tablet (25 mg total) by mouth daily. 90 tablet 3   No facility-administered medications prior to visit.   Allergies  Allergen Reactions   Percocet [Oxycodone-Acetaminophen] Nausea Only   Hydrocodone-Acetaminophen Nausea And Vomiting   Objective:   Today's Vitals   04/19/23 1519  BP: 126/74  Pulse: 76  Temp: 97.8 F (36.6 C)  TempSrc: Temporal  SpO2: 99%  Weight: 256 lb 6.4 oz (116.3 kg)  Height: 5' 3.5" (1.613 m)   Body mass index is 44.71 kg/m.   General: Well developed, well nourished. No acute distress. HEENT: There is a white exudate on the lateral margins of the tongue there are spotty adherent exudates on the right buccal   mucosa. Psych: Alert and oriented. Normal mood  and affect.  Health Maintenance Due  Topic Date Due   COVID-19 Vaccine (3 - 2023-24 season) 02/10/2023     Assessment & Plan:   Problem List Items Addressed This Visit       Cardiovascular and Mediastinum   Essential hypertension - Primary    Blood pressure is in good control. Continue carvedilol CR 80 mg daily, lisinopril 40 mg daily, minoxidil 2.5 mg daily, amlodipine 10 mg daily, and spironolactone 25 mg daily.       Systolic heart failure (HCC)    Compensated. Continue carvedilol CR 80 mg daily, furosemide 20 mg daily, lisinopril 40 mg daily, and spironolactone 25 mg daily.        Other  Generalized anxiety disorder    Improved with counseling and the switch to escitalopram 10 mg daily.      Relevant Medications   hydrOXYzine (ATARAX) 25 MG tablet   Other Visit Diagnoses     Thrush       Exam is consistent wiht thrush. I will treat this with nystatin 5 ml swish and swallow qid. I will check her blood sugar to rule out T2DM.   Relevant Medications   nystatin (MYCOSTATIN) 100000 UNIT/ML suspension   Other Relevant Orders   Basic metabolic panel       Return in about 3 months (around 07/20/2023) for Reassessment.   Loyola Mast, MD

## 2023-04-19 NOTE — Assessment & Plan Note (Signed)
Improved with counseling and the switch to escitalopram 10 mg daily.

## 2023-04-20 LAB — BASIC METABOLIC PANEL
BUN: 15 mg/dL (ref 7–25)
CO2: 26 mmol/L (ref 20–32)
Calcium: 9.6 mg/dL (ref 8.6–10.2)
Chloride: 98 mmol/L (ref 98–110)
Creat: 0.79 mg/dL (ref 0.50–0.99)
Glucose, Bld: 99 mg/dL (ref 65–99)
Potassium: 4.5 mmol/L (ref 3.5–5.3)
Sodium: 134 mmol/L — ABNORMAL LOW (ref 135–146)

## 2023-04-22 ENCOUNTER — Telehealth: Payer: Self-pay

## 2023-04-22 NOTE — Telephone Encounter (Signed)
FMLA form filled out and placed up front for pick up after speaking to patient.  Dm/cma

## 2023-04-23 ENCOUNTER — Ambulatory Visit: Payer: BC Managed Care – PPO | Admitting: Psychology

## 2023-04-23 DIAGNOSIS — F331 Major depressive disorder, recurrent, moderate: Secondary | ICD-10-CM

## 2023-04-23 NOTE — Progress Notes (Signed)
Behavioral Health Counselor/Therapist Progress Note  Patient ID: Tammy Hogan, MRN: [id number],    Date: 04/23/2023  Time Spent: 3:00pm-3:50pm   50 minutes   Treatment Type: Individual Therapy  Reported Symptoms: stress  Mental Status Exam: Appearance:  Casual     Behavior: Appropriate  Motor: Normal  Speech/Language:  Normal Rate  Affect: Appropriate  Mood: normal  Thought process: normal  Thought content:   WNL  Sensory/Perceptual disturbances:   WNL  Orientation: oriented to person, place, time/date, and situation  Attention: Good  Concentration: Good  Memory: WNL  Fund of knowledge:  Good  Insight:   Good  Judgment:  Good  Impulse Control: Good   Risk Assessment: Danger to Self:  No Self-injurious Behavior: No Danger to Others: No Duty to Warn:no Physical Aggression / Violence:No  Access to Firearms a concern: No  Gang Involvement:No   Subjective: Pt present for face-to-face individual therapy via video.  Pt consents to telehealth video session and is aware of limitations and benefits of virtual sessions. Location of pt: home Location of therapist: home office.   Pt talked about feeling too busy and overwhelmed.   She also has not felt motivated to get things done.  Pt saw her PCP last week and she is staying on the 10mg  of Lexapro bc it is helping her.  Pt feels her emotions are more regulated and she is not getting upset as easily. Pt graduates from college in December.   Her mother will attend graduation.  Pt is worried about how the visit will go.   Addressed their relationship dynamics. Pt talked about her husband.   He is white and at times pt feels like he does not understand what it is like for her to be a black person.   They have political and racial differences.  She does feel like her husband loves her very much and wants to make her happy.   Addressed how they can navigate their differences.   Pt states she wants to be in control of  situations so many tines she spreads herself too thin.  Pt tends to do a lot for others and does not engage in much self care.  Worked on how pt can create more balance in her life. Worked on self care strategies.   Pt wants to take more walks.  Set goal for pt to walk one block on her break at work.   Provided supportive therapy.    Interventions: Cognitive Behavioral Therapy and Insight-Oriented  Diagnosis: F33.1  Plan of Care: Recommend ongoing therapy.   Pt participated in setting treatment goals.    Pt states she wants to "find some peace".   Pt wants to improve coping skills.   Plan to meet every two weeks.   Pt agrees with treatment plan.   Treatment Plan Client Abilities/Strengths  Pt is bright, engaging, and motivated for therapy.   Client Treatment Preferences  Individual therapy.  Client Statement of Needs  Improve coping skills.  Symptoms  Depressed or irritable mood. Sleeplessness or hypersomnia. Poor concentration and indecisiveness. Low self-esteem.  Problems Addressed  Unipolar Depression Goals 1. Alleviate depressive symptoms and return to previous level of effective functioning. 2. Appropriately grieve the loss in order to normalize mood and to return to previously adaptive level of functioning. Objective Learn and implement behavioral strategies to overcome depression. Target Date: 2024-04-08 Frequency: Biweekly  Progress: 10 Modality: individual  Related Interventions Engage the client in "behavioral activation," increasing his/her activity  level and contact with sources of reward, while identifying processes that inhibit activation.  Use behavioral techniques such as instruction, rehearsal, role-playing, role reversal, as needed, to facilitate activity in the client's daily life; reinforce success. Assist the client in developing skills that increase the likelihood of deriving pleasure from behavioral activation (e.g., assertiveness skills, developing an exercise  plan, less internal/more external focus, increased social involvement); reinforce success. Objective Identify important people in life, past and present, and describe the quality, good and poor, of those relationships. Target Date: 2024-04-08 Frequency: Biweekly  Progress: 10 Modality: individual  Related Interventions Conduct Interpersonal Therapy beginning with the assessment of the client's "interpersonal inventory" of important past and present relationships; develop a case formulation linking depression to grief, interpersonal role disputes, role transitions, and/or interpersonal deficits). Objective Learn and implement problem-solving and decision-making skills. Target Date: 2024-04-08 Frequency: Biweekly  Progress: 10 Modality: individual  Related Interventions Conduct Problem-Solving Therapy using techniques such as psychoeducation, modeling, and role-playing to teach client problem-solving skills (i.e., defining a problem specifically, generating possible solutions, evaluating the pros and cons of each solution, selecting and implementing a plan of action, evaluating the efficacy of the plan, accepting or revising the plan); role-play application of the problem-solving skill to a real life issue. Encourage in the client the development of a positive problem orientation in which problems and solving them are viewed as a natural part of life and not something to be feared, despaired, or avoided. 3. Develop healthy interpersonal relationships that lead to the alleviation and help prevent the relapse of depression. 4. Develop healthy thinking patterns and beliefs about self, others, and the world that lead to the alleviation and help prevent the relapse of depression. 5. Recognize, accept, and cope with feelings of depression. Diagnosis F33.1  Conditions For Discharge Achievement of treatment goals and objectives   Salomon Fick, LCSW

## 2023-04-24 ENCOUNTER — Ambulatory Visit (INDEPENDENT_AMBULATORY_CARE_PROVIDER_SITE_OTHER): Payer: BC Managed Care – PPO | Admitting: Physician Assistant

## 2023-04-24 ENCOUNTER — Encounter (INDEPENDENT_AMBULATORY_CARE_PROVIDER_SITE_OTHER): Payer: Self-pay | Admitting: Physician Assistant

## 2023-04-24 VITALS — BP 117/74 | HR 68 | Temp 98.1°F | Ht 63.0 in | Wt 250.0 lb

## 2023-04-24 DIAGNOSIS — Z0289 Encounter for other administrative examinations: Secondary | ICD-10-CM

## 2023-04-24 DIAGNOSIS — F411 Generalized anxiety disorder: Secondary | ICD-10-CM

## 2023-04-24 DIAGNOSIS — I5022 Chronic systolic (congestive) heart failure: Secondary | ICD-10-CM | POA: Diagnosis not present

## 2023-04-24 DIAGNOSIS — M2550 Pain in unspecified joint: Secondary | ICD-10-CM | POA: Diagnosis not present

## 2023-04-24 DIAGNOSIS — E782 Mixed hyperlipidemia: Secondary | ICD-10-CM

## 2023-04-24 DIAGNOSIS — M47816 Spondylosis without myelopathy or radiculopathy, lumbar region: Secondary | ICD-10-CM

## 2023-04-24 DIAGNOSIS — E66813 Obesity, class 3: Secondary | ICD-10-CM | POA: Insufficient documentation

## 2023-04-24 DIAGNOSIS — I110 Hypertensive heart disease with heart failure: Secondary | ICD-10-CM

## 2023-04-24 DIAGNOSIS — Z6841 Body Mass Index (BMI) 40.0 and over, adult: Secondary | ICD-10-CM

## 2023-04-24 DIAGNOSIS — I10 Essential (primary) hypertension: Secondary | ICD-10-CM

## 2023-04-24 DIAGNOSIS — L732 Hidradenitis suppurativa: Secondary | ICD-10-CM

## 2023-04-24 NOTE — Progress Notes (Signed)
Office: [phone]  /  Fax: [phone]   Initial Visit  Tammy Hogan was seen in clinic today to evaluate for obesity. She is interested in losing weight to improve overall health and reduce the risk of weight related complications. She presents today to review program treatment options, initial physical assessment, and evaluation.     She was referred by: PCP  When asked what else they would like to accomplish? She states: Adopt healthier eating patterns, Improve energy levels and physical activity, Improve existing medical conditions, Reduce number of medications, Reduce risk for a surgery, Improve quality of life, Improve appearance, and Lose a target amount of weight : 199 lbs is goal  lbs in no time frame months.  Weight history: Started gaining at age 47.Overweight most of adult life.                    Tammy Hogan last year and was successful, but lost insurance coverage.   When asked how has your weight affected you? She states: Contributed to medical problems, Contributed to orthopedic problems or mobility issues, Having fatigue, Having poor endurance, Problems with eating patterns, and Has affected mood   Some associated conditions: Hypertension, Arthritis:Back, knee, shoulders, Hyperlipidemia, GERD, and Heart disease  Contributing factors: Family history of obesity, Use of obesogenic medications: Other: GABA, Moderate to high levels of stress, Reduced physical activity, Eating patterns, Mental health problems, Menopause, Slow metabolism for age, and Enticing relationships and enviroment  Weight promoting medications identified: Beta-blockers and Other: Gaba  Current nutrition plan: Other: Eats more fresh foods and portion control/smart choices  Current level of physical activity: Walking  Current or previous pharmacotherapy: GLP-1- Wegovy  Response to medication:  Wegovy last year and lost to 240 lbs but then lost coverage.    Past medical history includes:    Past Medical History:  Diagnosis Date   Anal fissure    Anemia    past hx    Anginal pain (HCC)    Anxiety    Tammy Hogan, Tammy Hogan   Bimalleolar fracture of left ankle 07/10/2013   CHF (congestive heart failure) (HCC)    systolic heart failure 2014   Depression    GERD (gastroesophageal reflux disease)    History of cardiac cath 07/23/2012   Tammy Hogan   History of echocardiogram    Cardiomyopathy-reduced EF 35-40% with wall m otion abnormality concerning for ischemia. Hypokinesis of the anterior and anteroseptal walls to the apex   HPV (human papilloma virus) infection    Hyperlipidemia    Hypertension    PCOS (polycystic ovarian syndrome)    pt denies   PPD positive, treated 2008   Tammy Hogan, Tammy Hogan     Objective:   BP 117/74   Pulse 68   Temp 98.1 F (36.7 C)   Ht 5\' 3"  (1.6 m)   Wt 250 lb (113.4 kg)   SpO2 97%   BMI 44.29 kg/m  She was weighed on the bioimpedance scale: Body mass index is 44.29 kg/m.  Peak Weight:276 lbs , Body Fat%:49.1%, Visceral Fat Rating:16, Weight trend over the last 12 months: Increasing  General:  Alert, oriented and cooperative. Patient is in no acute distress.  Respiratory: Normal respiratory effort, no problems with respiration noted   Gait: able to ambulate independently  Mental Status: Normal mood and affect. Normal behavior. Normal judgment and thought content.   DIAGNOSTIC DATA REVIEWED:  BMET    Component Value Date/Time   NA 134 (L) 04/19/2023  1552   NA 135 09/26/2022 1133   K 4.5 04/19/2023 1552   CL 98 04/19/2023 1552   CO2 26 04/19/2023 1552   GLUCOSE 99 04/19/2023 1552   BUN 15 04/19/2023 1552   BUN 15 09/26/2022 1133   CREATININE 0.79 04/19/2023 1552   CALCIUM 9.6 04/19/2023 1552   GFRNONAA 89 08/05/2019 1705   GFRAA 102 08/05/2019 1705   Lab Results  Component Value Date   HGBA1C 5.5 09/26/2022   HGBA1C 5.4 07/03/2012   No results found for: "INSULIN" CBC    Component Value Date/Time   WBC 12.9 (H) 11/20/2022  0844   RBC 3.93 11/20/2022 0844   HGB 11.6 (L) 11/20/2022 0844   HGB 11.9 09/26/2022 1133   HCT 36.6 11/20/2022 0844   HCT 37.4 09/26/2022 1133   PLT 444.0 (H) 11/20/2022 0844   PLT 582 (H) 09/26/2022 1133   MCV 93.0 11/20/2022 0844   MCV 88 09/26/2022 1133   MCH 28.1 09/26/2022 1133   MCH 29.1 07/10/2013 1528   MCHC 31.6 11/20/2022 0844   RDW 16.1 (H) 11/20/2022 0844   RDW 12.7 09/26/2022 1133   Iron/TIBC/Ferritin/ %Sat    Component Value Date/Time   IRON 69 08/05/2019 1705   TIBC 326 08/05/2019 1705   FERRITIN 133 08/05/2019 1705   IRONPCTSAT 21 08/05/2019 1705   Lipid Panel     Component Value Date/Time   CHOL 155 09/26/2022 1133   TRIG 71 09/26/2022 1133   HDL 40 09/26/2022 1133   CHOLHDL 3.9 09/26/2022 1133   CHOLHDL 5 05/10/2021 0813   VLDL 30.4 05/10/2021 0813   LDLCALC 101 (H) 09/26/2022 1133   Hepatic Function Panel     Component Value Date/Time   PROT 7.1 11/20/2022 0844   PROT 7.3 09/26/2022 1133   ALBUMIN 3.8 11/20/2022 0844   ALBUMIN 4.1 09/26/2022 1133   AST 11 11/20/2022 0844   ALT 17 11/20/2022 0844   ALKPHOS 100 11/20/2022 0844   BILITOT 0.4 11/20/2022 0844   BILITOT 0.4 09/26/2022 1133   BILIDIR 0.1 11/20/2022 0844      Component Value Date/Time   TSH 2.300 09/26/2022 1133     Assessment and Plan:   Essential hypertension  Chronic systolic heart failure (HCC)  Generalized anxiety disorder  Arthralgia of multiple joints  Hidradenitis suppurativa  Facet arthritis of lumbar region  Mixed hyperlipidemia  Class 3 severe obesity due to excess calories with serious comorbidity and body mass index (BMI) of 40.0 to 44.9 in adult [facility])        Obesity Treatment / Action Plan:  Patient will work on garnering support from family and friends to begin weight loss journey. Will work on eliminating or reducing the presence of highly palatable, calorie dense foods in the home. Will complete provided nutritional and psychosocial  assessment questionnaire before the next appointment. Will be scheduled for indirect calorimetry to determine resting energy expenditure in a fasting state.  This will allow [place] to create a reduced calorie, high-protein meal plan to promote loss of fat mass while preserving muscle mass. Will avoid skipping meals which may result in increased hunger signals and overeating at certain times. Counseled on the health benefits of losing 5%-15% of total body weight. Was counseled on nutritional approaches to weight loss and benefits of reducing processed foods and consuming plant-based foods and high quality protein as part of nutritional weight management. Was counseled on pharmacotherapy and role as an adjunct in weight management.   Obesity Education Performed  Today:  She was weighed on the bioimpedance scale and results were discussed and documented in the synopsis.  We discussed obesity as a disease and the importance of a more detailed evaluation of all the factors contributing to the disease.  We discussed the importance of long term lifestyle changes which include nutrition, exercise and behavioral modifications as well as the importance of customizing this to her specific health and social needs.  We discussed the benefits of reaching a healthier weight to alleviate the symptoms of existing conditions and reduce the risks of the biomechanical, metabolic and psychological effects of obesity.  Tammy Hogan appears to be in the action stage of change and states they are ready to start intensive lifestyle modifications and behavioral modifications.  30 minutes was spent today on this visit including the above counseling, pre-visit chart review, and post-visit documentation.  Reviewed by clinician on day of visit: allergies, medications, problem list, medical history, surgical history, family history, social history, and previous encounter notes pertinent to obesity  diagnosis.  Brittanni Cariker,PA-C

## 2023-04-25 ENCOUNTER — Encounter: Payer: Self-pay | Admitting: Nurse Practitioner

## 2023-04-25 ENCOUNTER — Ambulatory Visit: Payer: BC Managed Care – PPO | Admitting: Nurse Practitioner

## 2023-04-25 VITALS — BP 112/72 | HR 88 | Ht 63.0 in | Wt 254.2 lb

## 2023-04-25 DIAGNOSIS — K219 Gastro-esophageal reflux disease without esophagitis: Secondary | ICD-10-CM | POA: Diagnosis not present

## 2023-04-25 DIAGNOSIS — K5900 Constipation, unspecified: Secondary | ICD-10-CM

## 2023-04-25 NOTE — Progress Notes (Signed)
Noted  

## 2023-04-25 NOTE — Patient Instructions (Addendum)
Take Pepcid 20 mg (over the counter)- take 1 by mouth daily as needed  Miralax- every night as tolerated  Contact Colleen,NP with an update in 3-4 weeks.  Due to recent changes in healthcare laws, you may see the results of your imaging and laboratory studies on MyChart before your provider has had a chance to review them.  We understand that in some cases there may be results that are confusing or concerning to you. Not all laboratory results come back in the same time frame and the provider may be waiting for multiple results in order to interpret others.  Please give [place] 48 hours in order for your provider to thoroughly review all the results before contacting the office for clarification of your results.   Thank you for trusting me with your gastrointestinal care!   Alcide Evener, CRNP

## 2023-04-25 NOTE — Progress Notes (Signed)
04/25/2023 Tammy Hogan [id number] [birth year]   Chief Complaint: Constipation, lower abdominal pain  History of Present Illness: Tammy Hogan is a 47 year old female with a past medical history of anxiety, depression, obesity, hypertension, hyperlipidemia, nonischemic cardiomyopathy, CHF, PCOS, GERD and colon polyps. She is known by Dr. Marina Goodell.  She presents today for further evaluation regarding constipation, diarrhea and lower abdominal pain which started after she initiated [facility] in 2023, which she took for about 9 months then stopped taking it 08/2022.  Her constipation, diarrhea and lower abdominal pain significantly improved after she stopped [facility] but she continues to have some constipation.  She describes passing a normal formed brown bowel movement 3 to 4 days weekly but does not feel emptied.  She often feels bloated.  No rectal bleeding or black stools.  She eats prunes and takes milk of magnesia as needed when she feels really constipated.  Dulcolax caused stomach discomfort.  She has a history of an anal fissure but has not seen any blood per the rectum in several years.  No anal/rectal discomfort.  She underwent a colonoscopy 07/13/2020 which identified one 3 mm tubular adenomatous polyp removed from the sigmoid colon.  Last GYN exam was 09/2022 which she reported was normal.  No heartburn on Pantoprazole 40mg  every day.  She sometimes feels like she has difficulty initiating in a swallow and saw an ENT who thought she likely had globus.  She denies having any dysphagia with solid foods, liquids or pills.  EGD 03/28/2018 showed antral erosions.  She takes ASA 81 mg daily.  No other NSAID use.      Latest Ref Rng & Units 11/20/2022    8:44 AM 09/26/2022   11:33 AM 08/05/2019    5:05 PM  CBC  WBC 4.0 - 10.5 K/uL 12.9  13.9  11.2   Hemoglobin 12.0 - 15.0 g/dL 40.9  81.1  91.4   Hematocrit 36.0 - 46.0 % 36.6  37.4  39.5   Platelets 150.0 - 400.0 K/uL 444.0  582  444         Latest Ref Rng & Units 04/19/2023    3:52 PM 11/20/2022    8:44 AM 09/26/2022   11:33 AM  CMP  Glucose 65 - 99 mg/dL 99   782   BUN 7 - 25 mg/dL 15   15   Creatinine 9.56 - 0.99 mg/dL 2.13   0.86   Sodium 578 - 146 mmol/L 134   135   Potassium 3.5 - 5.3 mmol/L 4.5   4.9   Chloride 98 - 110 mmol/L 98   98   CO2 20 - 32 mmol/L 26   22   Calcium 8.6 - 10.2 mg/dL 9.6   9.3   Total Protein 6.0 - 8.3 g/dL  7.1  7.3   Total Bilirubin 0.2 - 1.2 mg/dL  0.4  0.4   Alkaline Phos 39 - 117 U/L  100  141   AST 0 - 37 U/L  11  10   ALT 0 - 35 U/L  17  15     ECHO 06/08/2020: 1. Left ventricular ejection fraction, by estimation, is 50 to 55%. The left ventricle has low normal function. The left ventricle has no regional wall motion abnormalities. There is mild concentric left ventricular hypertrophy. Left ventricular diastolic parameters are consistent with Grade I diastolic dysfunction (impaired relaxation). 2. Right ventricular systolic function is normal. The right ventricular size is normal. 3. The mitral valve  is grossly normal. Mild mitral valve regurgitation. 4. The aortic valve is grossly normal. Aortic valve regurgitation is not visualized. No aortic stenosis is present. 5. The inferior vena cava is normal in size with greater than 50% respiratory variability, suggesting right atrial pressure of 3 mmHg   Past Medical History:  Diagnosis Date   Anal fissure    Anemia    past hx    Anginal pain (HCC)    Anxiety    [place], [place]   Bimalleolar fracture of left ankle 07/10/2013   CHF (congestive heart failure) (HCC)    systolic heart failure 2014   Depression    GERD (gastroesophageal reflux disease)    History of cardiac cath 07/23/2012   Dr Eldridge Dace   History of echocardiogram    Cardiomyopathy-reduced EF 35-40% with wall m otion abnormality concerning for ischemia. Hypokinesis of the anterior and anteroseptal walls to the apex   HPV (human papilloma virus) infection     Hyperlipidemia    Hypertension    PCOS (polycystic ovarian syndrome)    pt denies   PPD positive, treated 2008   [place], [place]   Past Surgical History:  Procedure Laterality Date   CARDIAC CATHETERIZATION  feb, 2014   Dr Eldridge Dace   FOOT SURGERY  10/2019   ORIF ANKLE FRACTURE Left 07/10/2013   Procedure: OPEN REDUCTION INTERNAL FIXATION (ORIF) LEFT BIMALLEOLAR ANKLE FRACTURE;  Surgeon: Kathryne Hitch, MD;  Location: [facility];  Service: Orthopedics;  Laterality: Left;   EGD 03/28/2018: 1. GERD  2. Antral erosions  3. Otherwise normal exam  Colonoscopy 07/13/2020 by Dr. Marina Goodell: - One 3 mm polyp in the sigmoid colon, removed with a cold snare. Resected and retrieved.  - The examination was otherwise normal on direct and retroflexion views. - 7 Year recall colonoscopy  Surgical [P], colon, sigmoid, polyp - TUBULAR ADENOMA. - NO HIGH GRADE DYSPLASIA OR MALIGNANCY.  Current Outpatient Medications on File Prior to Visit  Medication Sig Dispense Refill   AMBULATORY NON FORMULARY MEDICATION Medication Name: Diltiazem ointment 2%- apply to anal sphincter 5 times a day as instructed per Dr. Marina Goodell 30 g 3   amLODipine (NORVASC) 10 MG tablet TAKE ONE TABLET BY MOUTH DAILY 90 tablet 3   aspirin 81 MG tablet Take 81 mg by mouth daily.     atorvastatin (LIPITOR) 10 MG tablet TAKE ONE TABLET BY MOUTH DAILY 90 tablet 3   carvedilol (COREG CR) 80 MG 24 hr capsule Take 1 capsule (80 mg total) by mouth daily. 90 capsule 3   clindamycin (CLINDAGEL) 1 % gel Apply 1 Application topically 2 (two) times daily.     clobetasol (TEMOVATE) 0.05 % external solution Apply topically.     escitalopram (LEXAPRO) 10 MG tablet Take 1 tablet (10 mg total) by mouth daily. 90 tablet 3   fexofenadine (ALLEGRA) 180 MG tablet Take 180 mg by mouth daily.     furosemide (LASIX) 20 MG tablet TAKE 1 TABLET BY MOUTH DAILY 90 tablet 3   gabapentin (NEURONTIN) 300 MG capsule TAKE ONE CAPSULE BY MOUTH AT BEDTIME 90 capsule 3    hydrOXYzine (ATARAX) 25 MG tablet Take 25 mg by mouth 3 (three) times daily.     levonorgestrel (MIRENA) 20 MCG/24HR IUD 1 each by Intrauterine route once.     lisinopril (ZESTRIL) 40 MG tablet TAKE ONE TABLET BY MOUTH DAILY 90 tablet 3   minoxidil (LONITEN) 2.5 MG tablet Take by mouth.     nitroGLYCERIN (NITROSTAT) 0.4  MG SL tablet Place 1 tablet (0.4 mg total) under the tongue every 5 (five) minutes as needed for chest pain. 25 tablet 3   nystatin (MYCOSTATIN) 100000 UNIT/ML suspension Take 5 mLs (500,000 Units total) by mouth 4 (four) times daily. 60 mL 0   nystatin cream (MYCOSTATIN) Apply 1 Application topically 2 (two) times daily.     ondansetron (ZOFRAN) 4 MG tablet TAKE 1 TABLET BY MOUTH EVERY 8 HOURS AS NEEDED FOR NAUSEA AND/OR VOMITING 18 tablet 0   pantoprazole (PROTONIX) 40 MG tablet TAKE ONE TABLET BY MOUTH DAILY 90 tablet 3   spironolactone (ALDACTONE) 25 MG tablet Take 1 tablet (25 mg total) by mouth daily. 90 tablet 3   No current facility-administered medications on file prior to visit.   Allergies  Allergen Reactions   Percocet [Oxycodone-Acetaminophen] Nausea Only   Hydrocodone-Acetaminophen Nausea And Vomiting   Current Medications, Allergies, Past Medical History, Past Surgical History, Family History and Social History were reviewed in [employer] record.  Review of Systems:   Constitutional: Negative for fever, sweats, chills or weight loss.  Respiratory: Negative for shortness of breath.   Cardiovascular: Negative for chest pain, palpitations and leg swelling.  Gastrointestinal: See HPI.  Musculoskeletal: Negative for back pain or muscle aches.  Neurological: Negative for dizziness, headaches or paresthesias.    Physical Exam: BP 112/72   Pulse 88   Ht 5\' 3"  (1.6 m)   Wt 254 lb 4 oz (115.3 kg)   BMI 45.04 kg/m   Wt Readings from Last 3 Encounters:  04/25/23 254 lb 4 oz (115.3 kg)  04/24/23 250 lb (113.4 kg)  04/19/23 256 lb  6.4 oz (116.3 kg)    General: 47 year old female in no acute distress. Head: Normocephalic and atraumatic. Eyes: No scleral icterus. Conjunctiva pink . Ears: Normal auditory acuity. Mouth: Dentition intact. No ulcers or lesions.  Neck: Right thyroid lobe > left without discrete nodules Lungs: Clear throughout to auscultation. Heart: Regular rate and rhythm, no murmur. Abdomen: Soft, obese abdomen, nontender and nondistended. No masses or hepatomegaly. Normal bowel sounds x 4 quadrants.  Rectal: Deferred.  Musculoskeletal: Symmetrical with no gross deformities. Extremities: No edema. Neurological: Alert oriented x 4. No focal deficits.  Psychological: Alert and cooperative. Normal mood and affect  Assessment and Recommendations:  47 year old female with diarrhea, constipation, lower abdominal pain with abdominal bloat which did after initiating Wegovy, symptoms significantly proved after Wegovy was discontinued 08/2022 but patient continues to have constipation with bloat. -Take Miralax 1 capful mixed in 8 ounces of water at bed time for constipation as tolerated. -Milk of Magnesia 2 to 4 TBSP Q HS PRN -Patient to contact me in 3 to 4 weeks with an update   History of colon polyps. One 3mm tubular adenomatous polyp removed from the colon per colonoscopy 07/13/2020 -Next colonoscopy due 07/2027  Normocytic anemia. No overt GI bleeding.  -Check CBC, IBC + ferritin and B12 with next lab draw  Ischemic cardiomyopathy. LV EF 50 - 55%.

## 2023-05-02 ENCOUNTER — Telehealth: Payer: BC Managed Care – PPO | Admitting: Emergency Medicine

## 2023-05-02 DIAGNOSIS — J029 Acute pharyngitis, unspecified: Secondary | ICD-10-CM | POA: Diagnosis not present

## 2023-05-02 NOTE — Progress Notes (Signed)
  E-Visit for Sore Throat  We are sorry that you are not feeling well.  Here is how we plan to help!  Your symptoms indicate a likely viral infection (Pharyngitis).   Pharyngitis is inflammation in the back of the throat which can cause a sore throat, scratchiness and sometimes difficulty swallowing.   Pharyngitis is typically caused by a respiratory virus and will just run its course.  Please keep in mind that your symptoms could last up to 10 days.  For throat pain, we recommend over the counter oral pain relief medications such as acetaminophen or aspirin, or anti-inflammatory medications such as ibuprofen or naproxen sodium.  Topical treatments such as oral throat lozenges or sprays may be used as needed.  Avoid close contact with loved ones, especially the very young and elderly.  Remember to wash your hands thoroughly throughout the day as this is the number one way to prevent the spread of infection and wipe down door knobs and counters with disinfectant.  After careful review of your answers, I would not recommend an antibiotic for your condition.  Antibiotics should not be used to treat conditions that we suspect are caused by viruses like the virus that causes the common cold or flu. However, some people can have Strep with atypical symptoms. You may need formal testing in clinic or office to confirm if your symptoms continue or worsen.  Providers prescribe antibiotics to treat infections caused by bacteria. Antibiotics are very powerful in treating bacterial infections when they are used properly.  To maintain their effectiveness, they should be used only when necessary.  Overuse of antibiotics has resulted in the development of super bugs that are resistant to treatment!    Home Care: Only take medications as instructed by your medical team. Do not drink alcohol while taking these medications. A steam or ultrasonic humidifier can help congestion.  You can place a towel over your head and  breathe in the steam from hot water coming from a faucet. Avoid close contacts especially the very young and the elderly. Cover your mouth when you cough or sneeze. Always remember to wash your hands.  Get Help Right Away If: You develop worsening fever or throat pain. You develop a severe head ache or visual changes. Your symptoms persist after you have completed your treatment plan.  Make sure you Understand these instructions. Will watch your condition. Will get help right away if you are not doing well or get worse.   Thank you for choosing an e-visit.  Your e-visit answers were reviewed by a board certified advanced clinical practitioner to complete your personal care plan. Depending upon the condition, your plan could have included both over the counter or prescription medications.  Please review your pharmacy choice. Make sure the pharmacy is open so you can pick up prescription now. If there is a problem, you may contact your provider through MyChart messaging and have the prescription routed to another pharmacy.  Your safety is important to us. If you have drug allergies check your prescription carefully.   For the next 24 hours you can use MyChart to ask questions about today's visit, request a non-urgent call back, or ask for a work or school excuse. You will get an email in the next two days asking about your experience. I hope that your e-visit has been valuable and will speed your recovery.  Approximately 5 minutes was used in reviewing the patient's chart, questionnaire, prescribing medications, and documentation.  

## 2023-05-15 ENCOUNTER — Ambulatory Visit: Payer: BC Managed Care – PPO | Admitting: Psychology

## 2023-05-15 DIAGNOSIS — F331 Major depressive disorder, recurrent, moderate: Secondary | ICD-10-CM

## 2023-05-15 NOTE — Progress Notes (Signed)
[facility] Counselor/Therapist Progress Note  Patient ID: Marshia Bergan, MRN: [id number],    Date: 05/15/2023  Time Spent: 9:00am-9:55am   48 minutes   Treatment Type: Individual Therapy  Reported Symptoms: stress  Mental Status Exam: Appearance:  Casual     Behavior: Appropriate  Motor: Normal  Speech/Language:  Normal Rate  Affect: Appropriate  Mood: normal  Thought process: normal  Thought content:   WNL  Sensory/Perceptual disturbances:   WNL  Orientation: oriented to person, place, time/date, and situation  Attention: Good  Concentration: Good  Memory: WNL  Fund of knowledge:  Good  Insight:   Good  Judgment:  Good  Impulse Control: Good   Risk Assessment: Danger to Self:  No Self-injurious Behavior: No Danger to Others: No Duty to Warn:no Physical Aggression / Violence:No  Access to Firearms a concern: No  Gang Involvement:No   Subjective: Pt present for face-to-face individual therapy via video.  Pt consents to telehealth video session and is aware of limitations and benefits of virtual sessions. Location of pt: home Location of therapist: home office.   Pt talked about getting the job she interviewed for at [employer].  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   Her last day at her current job will be Dec. 18th.   Pt is very glad to be leaving her current job bc there have been a lot of negative issues there.   Pt talked about quitting her second job at the [employer] bc of disagreements with coworkers.  Addressed the issues.   Pt is sad about leaving that job and feels hurt by the things that transpired. Pt talked about her mother coming to visit next week.   Pt is concerned about the visit bc her mother is "judgemental, loud, and mean."  Pt states her mother is "toxic".   Pt's husband does not like her mother.  Pt's mother, sister and 48 yo nephew live together in [place].  This is not a good environment bc pt's mother is  so critical and difficult to be with.    Pt is trying to help her nephew move out into his own place.   Helped pt process her feelings and family dynamics.   Worked on self care strategies.   Provided supportive therapy.    Interventions: Cognitive Behavioral Therapy and Insight-Oriented  Diagnosis: F33.1  Plan of Care: Recommend ongoing therapy.   Pt participated in setting treatment goals.    Pt states she wants to "find some peace".   Pt wants to improve coping skills.   Plan to meet every two weeks.   Pt agrees with treatment plan.   Treatment Plan Client Abilities/Strengths  Pt is bright, engaging, and motivated for therapy.   Client Treatment Preferences  Individual therapy.  Client Statement of Needs  Improve coping skills.  Symptoms  Depressed or irritable mood. Sleeplessness or hypersomnia. Poor concentration and indecisiveness. Low self-esteem.  Problems Addressed  Unipolar Depression Goals 1. Alleviate depressive symptoms and return to previous level of effective functioning. 2. Appropriately grieve the loss in order to normalize mood and to return to previously adaptive level of functioning. Objective Learn and implement behavioral strategies to overcome depression. Target Date: 2024-04-08 Frequency: Biweekly  Progress: 10 Modality: individual  Related Interventions Engage the client in "behavioral activation," increasing his/her activity level and contact with sources of reward, while identifying processes that inhibit activation.  Use behavioral techniques such as instruction, rehearsal, role-playing,  role reversal, as needed, to facilitate activity in the client's daily life; reinforce success. Assist the client in developing skills that increase the likelihood of deriving pleasure from behavioral activation (e.g., assertiveness skills, developing an exercise plan, less internal/more external focus, increased social involvement); reinforce  success. Objective Identify important people in life, past and present, and describe the quality, good and poor, of those relationships. Target Date: 2024-04-08 Frequency: Biweekly  Progress: 10 Modality: individual  Related Interventions Conduct Interpersonal Therapy beginning with the assessment of the client's "interpersonal inventory" of important past and present relationships; develop a case formulation linking depression to grief, interpersonal role disputes, role transitions, and/or interpersonal deficits). Objective Learn and implement problem-solving and decision-making skills. Target Date: 2024-04-08 Frequency: Biweekly  Progress: 10 Modality: individual  Related Interventions Conduct Problem-Solving Therapy using techniques such as psychoeducation, modeling, and role-playing to teach client problem-solving skills (i.e., defining a problem specifically, generating possible solutions, evaluating the pros and cons of each solution, selecting and implementing a plan of action, evaluating the efficacy of the plan, accepting or revising the plan); role-play application of the problem-solving skill to a real life issue. Encourage in the client the development of a positive problem orientation in which problems and solving them are viewed as a natural part of life and not something to be feared, despaired, or avoided. 3. Develop healthy interpersonal relationships that lead to the alleviation and help prevent the relapse of depression. 4. Develop healthy thinking patterns and beliefs about self, others, and the world that lead to the alleviation and help prevent the relapse of depression. 5. Recognize, accept, and cope with feelings of depression. Diagnosis F33.1  Conditions For Discharge Achievement of treatment goals and objectives   Salomon Fick, LCSW

## 2023-05-22 ENCOUNTER — Ambulatory Visit (INDEPENDENT_AMBULATORY_CARE_PROVIDER_SITE_OTHER): Payer: BC Managed Care – PPO | Admitting: Family Medicine

## 2023-05-22 ENCOUNTER — Encounter (INDEPENDENT_AMBULATORY_CARE_PROVIDER_SITE_OTHER): Payer: Self-pay

## 2023-05-22 DIAGNOSIS — Z91199 Patient's noncompliance with other medical treatment and regimen due to unspecified reason: Secondary | ICD-10-CM

## 2023-05-22 NOTE — Progress Notes (Signed)
No show for OV

## 2023-05-31 ENCOUNTER — Ambulatory Visit: Payer: BC Managed Care – PPO | Admitting: Psychology

## 2023-05-31 DIAGNOSIS — F331 Major depressive disorder, recurrent, moderate: Secondary | ICD-10-CM | POA: Diagnosis not present

## 2023-05-31 NOTE — Progress Notes (Signed)
[facility] Counselor/Therapist Progress Note  Patient ID: Lasey Beldon, MRN: [id number],    Date: 05/31/2023  Time Spent: 4:00pm-4:55pm   55 minutes   Treatment Type: Individual Therapy  Reported Symptoms: stress  Mental Status Exam: Appearance:  Casual     Behavior: Appropriate  Motor: Normal  Speech/Language:  Normal Rate  Affect: Appropriate  Mood: normal  Thought process: normal  Thought content:   WNL  Sensory/Perceptual disturbances:   WNL  Orientation: oriented to person, place, time/date, and situation  Attention: Good  Concentration: Good  Memory: WNL  Fund of knowledge:  Good  Insight:   Good  Judgment:  Good  Impulse Control: Good   Risk Assessment: Danger to Self:  No Self-injurious Behavior: No Danger to Others: No Duty to Warn:no Physical Aggression / Violence:No  Access to Firearms a concern: No  Gang Involvement:No   Subjective: Pt present for face-to-face individual therapy via video.  Pt consents to telehealth video session and is aware of limitations and benefits of virtual sessions. Location of pt: home Location of therapist: home office.   Pt talked about her job.  Her last day will be 1/2 and her new job starts on 1/6.   Pt feels leaving is bitter sweet bc she will miss some of her coworkers.  However pt is glad to be leaving the issues with her supervisor.  Pt talked about her mother visiting last week for pt's graduation.  Pt's mother is alcoholic and was cranky and bossy to pt.  Addressed the challenging interactions pt had with her mother and how they impacted her.  Pt tries to be understanding and empathic toward her mother bc her mother had a horrible abusive childhood.    Pt tries to spread love and compassion even when it is difficult like when her inlaws make racist comments.  Helped pt process her feelings and family dynamics.   Worked on self care strategies.   Provided supportive therapy.    Interventions:  Cognitive Behavioral Therapy and Insight-Oriented  Diagnosis: F33.1  Plan of Care: Recommend ongoing therapy.   Pt participated in setting treatment goals.    Pt states she wants to "find some peace".   Pt wants to improve coping skills.   Plan to meet every two weeks.   Pt agrees with treatment plan.   Treatment Plan Client Abilities/Strengths  Pt is bright, engaging, and motivated for therapy.   Client Treatment Preferences  Individual therapy.  Client Statement of Needs  Improve coping skills.  Symptoms  Depressed or irritable mood. Sleeplessness or hypersomnia. Poor concentration and indecisiveness. Low self-esteem.  Problems Addressed  Unipolar Depression Goals 1. Alleviate depressive symptoms and return to previous level of effective functioning. 2. Appropriately grieve the loss in order to normalize mood and to return to previously adaptive level of functioning. Objective Learn and implement behavioral strategies to overcome depression. Target Date: 2024-04-08 Frequency: Biweekly  Progress: 10 Modality: individual  Related Interventions Engage the client in "behavioral activation," increasing his/her activity level and contact with sources of reward, while identifying processes that inhibit activation.  Use behavioral techniques such as instruction, rehearsal, role-playing, role reversal, as needed, to facilitate activity in the client's daily life; reinforce success. Assist the client in developing skills that increase the likelihood of deriving pleasure from behavioral activation (e.g., assertiveness skills, developing an exercise plan, less internal/more external focus, increased social involvement); reinforce success. Objective Identify important people in life, past and present, and describe the quality, good and  poor, of those relationships. Target Date: 2024-04-08 Frequency: Biweekly  Progress: 10 Modality: individual  Related Interventions Conduct Interpersonal  Therapy beginning with the assessment of the client's "interpersonal inventory" of important past and present relationships; develop a case formulation linking depression to grief, interpersonal role disputes, role transitions, and/or interpersonal deficits). Objective Learn and implement problem-solving and decision-making skills. Target Date: 2024-04-08 Frequency: Biweekly  Progress: 10 Modality: individual  Related Interventions Conduct Problem-Solving Therapy using techniques such as psychoeducation, modeling, and role-playing to teach client problem-solving skills (i.e., defining a problem specifically, generating possible solutions, evaluating the pros and cons of each solution, selecting and implementing a plan of action, evaluating the efficacy of the plan, accepting or revising the plan); role-play application of the problem-solving skill to a real life issue. Encourage in the client the development of a positive problem orientation in which problems and solving them are viewed as a natural part of life and not something to be feared, despaired, or avoided. 3. Develop healthy interpersonal relationships that lead to the alleviation and help prevent the relapse of depression. 4. Develop healthy thinking patterns and beliefs about self, others, and the world that lead to the alleviation and help prevent the relapse of depression. 5. Recognize, accept, and cope with feelings of depression. Diagnosis F33.1  Conditions For Discharge Achievement of treatment goals and objectives   Salomon Fick, LCSW

## 2023-06-06 ENCOUNTER — Telehealth: Payer: Self-pay | Admitting: Nurse Practitioner

## 2023-06-06 DIAGNOSIS — K219 Gastro-esophageal reflux disease without esophagitis: Secondary | ICD-10-CM

## 2023-06-06 NOTE — Telephone Encounter (Signed)
DD, please contact the patient and let her know her prior labs 11/2022 which I reviewed showed very mild anemia.  Please send her to our lab in the next 2 to 4 weeks for CBC, IBC + ferritin panel. THX.

## 2023-06-07 NOTE — Telephone Encounter (Signed)
Contacted patient and patient is aware to return to our lab in 2-4 weeks.

## 2023-06-07 NOTE — Addendum Note (Signed)
Addended by: Rise Paganini on: 06/07/2023 02:53 PM   Modules accepted: Orders

## 2023-06-07 NOTE — Telephone Encounter (Signed)
Patient called and stated that she was speak with a nurse and the call dropped. Patient is requesting a return call. Please Advise.

## 2023-06-17 ENCOUNTER — Ambulatory Visit (INDEPENDENT_AMBULATORY_CARE_PROVIDER_SITE_OTHER): Payer: BC Managed Care – PPO | Admitting: Family Medicine

## 2023-06-25 ENCOUNTER — Ambulatory Visit: Payer: Self-pay | Admitting: Psychology

## 2023-06-25 ENCOUNTER — Telehealth: Payer: Self-pay | Admitting: Nurse Practitioner

## 2023-06-25 ENCOUNTER — Telehealth: Payer: Self-pay | Admitting: Physician Assistant

## 2023-06-25 DIAGNOSIS — B379 Candidiasis, unspecified: Secondary | ICD-10-CM

## 2023-06-25 DIAGNOSIS — L0291 Cutaneous abscess, unspecified: Secondary | ICD-10-CM

## 2023-06-25 DIAGNOSIS — L732 Hidradenitis suppurativa: Secondary | ICD-10-CM

## 2023-06-25 DIAGNOSIS — T3695XA Adverse effect of unspecified systemic antibiotic, initial encounter: Secondary | ICD-10-CM

## 2023-06-25 MED ORDER — CEPHALEXIN 500 MG PO CAPS: 500.0000 mg | ORAL_CAPSULE | Freq: Four times a day (QID) | ORAL | 0 refills | Status: DC

## 2023-06-25 MED ORDER — FLUCONAZOLE 150 MG PO TABS: ORAL_TABLET | ORAL | 0 refills | Status: DC

## 2023-06-25 NOTE — Progress Notes (Signed)
   Thank you for the details you included in the comment boxes. Those details are very helpful in determining the best course of treatment for you and help us  to provide the best care.Because you are needing assessment for something not an e-visit option, we recommend that you schedule a Virtual Urgent Care video visit in order for the provider to better assess what is going on.  The provider will be able to give you a more accurate diagnosis and treatment plan if we can more freely discuss your symptoms and with the addition of a virtual examination.   If you change your visit to a video visit, we will bill your insurance (similar to an office visit) and you will not be charged for this e-Visit. You will be able to stay at home and speak with the first available [facility] advanced practice provider. The link to do a video visit is in the drop down Menu tab of your Welcome screen in MyChart.

## 2023-06-25 NOTE — Progress Notes (Signed)
 Virtual Visit Consent   Tammy Hogan, you are scheduled for a virtual visit with a [facility] provider today. Just as with appointments in the office, your consent must be obtained to participate. Your consent will be active for this visit and any virtual visit you may have with one of our providers in the next 365 days. If you have a MyChart account, a copy of this consent can be sent to you electronically.  As this is a virtual visit, video technology does not allow for your provider to perform a traditional examination. This may limit your provider's ability to fully assess your condition. If your provider identifies any concerns that need to be evaluated in person or the need to arrange testing (such as labs, EKG, etc.), we will make arrangements to do so. Although advances in technology are sophisticated, we cannot ensure that it will always work on either your end or our end. If the connection with a video visit is poor, the visit may have to be switched to a telephone visit. With either a video or telephone visit, we are not always able to ensure that we have a secure connection.  By engaging in this virtual visit, you consent to the provision of healthcare and authorize for your insurance to be billed (if applicable) for the services provided during this visit. Depending on your insurance coverage, you may receive a charge related to this service.  I need to obtain your verbal consent now. Are you willing to proceed with your visit today? Tammy Hogan has provided verbal consent on 06/25/2023 for a virtual visit (video or telephone). Tammy Kitty, FNP  Date: 06/25/2023 9:37 AM  Virtual Visit via Video Note   I, Tammy Hogan, connected with  Tammy Hogan  ([id number], 03-22-1976) on 06/25/23 at  9:45 AM EST by a video-enabled telemedicine application and verified that I am speaking with the correct person using two identifiers.  Location: Patient: Virtual Visit  Location Patient: Home Provider: Virtual Visit Location Provider: Home Office   I discussed the limitations of evaluation and management by telemedicine and the availability of in person appointments. The patient expressed understanding and agreed to proceed.    History of Present Illness: Tammy Hogan is a 48 y.o. 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   She has this recurrent for over 20 years   Her most recent exacerbation was in September of 2024, she was given doxycyline at that time and did not have a good outcome  She has done well with Keflex  in the past  Most recently took prior in July of 2024   She does see a dermatologist and they also prescribe the kelfex for flares, she has continued to have good outcomes   Her abscesses are not always in the armpit, she has had them in various places in her body      Problems:  Patient Active Problem List   Diagnosis Date Noted   Class 3 severe obesity due to excess calories with serious comorbidity and body mass index (BMI) of 40.0 to 44.9 in adult (HCC) 04/24/2023   LPRD (laryngopharyngeal reflux disease) 12/20/2022   Bilateral sacroiliitis (HCC) 11/20/2022   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   Facet arthritis of lumbar region 02/14/2022   Hidradenitis suppurativa 04/21/2021   Anal fissure 04/21/2021   Keratosis pilaris 04/21/2021   Trochanteric  bursitis of right hip 08/01/2020   Carpal tunnel syndrome of right wrist 06/26/2018   Left knee tendonitis 05/11/2018   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   Gastroesophageal reflux disease 06/20/2016   Mixed hyperlipidemia 05/30/2016   Cervicalgia 05/21/2016   Generalized anxiety disorder 05/15/2016   Arthralgia of multiple joints 05/15/2016   Morbid obesity with BMI of 40.0-44.9, adult (HCC) 05/15/2016   Essential hypertension  09/07/2014   Cardiomyopathy (HCC) 07/02/2012   Systolic heart failure (HCC) 07/02/2012    Allergies:  Allergies  Allergen Reactions   Percocet [Oxycodone -Acetaminophen ] Nausea Only   Hydrocodone -Acetaminophen  Nausea And Vomiting   Medications:  Current Outpatient Medications:    AMBULATORY NON FORMULARY MEDICATION, Medication Name: Diltiazem ointment 2%- apply to anal sphincter 5 times a day as instructed per Dr. Abran, Disp: 30 g, Rfl: 3   amLODipine  (NORVASC ) 10 MG tablet, TAKE ONE TABLET BY MOUTH DAILY, Disp: 90 tablet, Rfl: 3   aspirin  81 MG tablet, Take 81 mg by mouth daily., Disp: , Rfl:    atorvastatin  (LIPITOR) 10 MG tablet, TAKE ONE TABLET BY MOUTH DAILY, Disp: 90 tablet, Rfl: 3   carvedilol  (COREG  CR) 80 MG 24 hr capsule, Take 1 capsule (80 mg total) by mouth daily., Disp: 90 capsule, Rfl: 3   clindamycin  (CLINDAGEL) 1 % gel, Apply 1 Application topically 2 (two) times daily., Disp: , Rfl:    clobetasol  (TEMOVATE ) 0.05 % external solution, Apply topically., Disp: , Rfl:    escitalopram  (LEXAPRO ) 10 MG tablet, Take 1 tablet (10 mg total) by mouth daily., Disp: 90 tablet, Rfl: 3   fexofenadine (ALLEGRA) 180 MG tablet, Take 180 mg by mouth daily., Disp: , Rfl:    furosemide  (LASIX ) 20 MG tablet, TAKE 1 TABLET BY MOUTH DAILY, Disp: 90 tablet, Rfl: 3   gabapentin  (NEURONTIN ) 300 MG capsule, TAKE ONE CAPSULE BY MOUTH AT BEDTIME, Disp: 90 capsule, Rfl: 3   hydrOXYzine (ATARAX) 25 MG tablet, Take 25 mg by mouth 3 (three) times daily., Disp: , Rfl:    levonorgestrel (MIRENA) 20 MCG/24HR IUD, 1 each by Intrauterine route once., Disp: , Rfl:    lisinopril  (ZESTRIL ) 40 MG tablet, TAKE ONE TABLET BY MOUTH DAILY, Disp: 90 tablet, Rfl: 3   minoxidil (LONITEN) 2.5 MG tablet, Take by mouth., Disp: , Rfl:    nitroGLYCERIN  (NITROSTAT ) 0.4 MG SL tablet, Place 1 tablet (0.4 mg total) under the tongue every 5 (five) minutes as needed for chest pain., Disp: 25 tablet, Rfl: 3   nystatin  (MYCOSTATIN )  100000 UNIT/ML suspension, Take 5 mLs (500,000 Units total) by mouth 4 (four) times daily., Disp: 60 mL, Rfl: 0   nystatin  cream (MYCOSTATIN ), Apply 1 Application topically 2 (two) times daily., Disp: , Rfl:    ondansetron  (ZOFRAN ) 4 MG tablet, TAKE 1 TABLET BY MOUTH EVERY 8 HOURS AS NEEDED FOR NAUSEA AND/OR VOMITING, Disp: 18 tablet, Rfl: 0   pantoprazole  (PROTONIX ) 40 MG tablet, TAKE ONE TABLET BY MOUTH DAILY, Disp: 90 tablet, Rfl: 3   spironolactone  (ALDACTONE ) 25 MG tablet, Take 1 tablet (25 mg total) by mouth daily., Disp: 90 tablet, Rfl: 3  Observations/Objective: Patient is well-developed, well-nourished in no acute distress.  Resting comfortably  at home.  Head is normocephalic, atraumatic.  No labored breathing.  Speech is clear and coherent with logical content.  Patient is alert and oriented at baseline.    Assessment and Plan: 1. Hidradenitis axillaris (Primary)  - cephALEXin  (KEFLEX ) 500 MG capsule; Take 1 capsule (500 mg total) by mouth  4 (four) times daily for 10 days.  Dispense: 40 capsule; Refill: 0  2. Antibiotic-induced yeast infection  - fluconazole  (DIFLUCAN ) 150 MG tablet; Take one tablet by mouth, may repeat after 72 hours if needed  Dispense: 2 tablet; Refill: 0     Follow up with dermatology to discuss recurrent infection management   Follow Up Instructions: I discussed the assessment and treatment plan with the patient. The patient was provided an opportunity to ask questions and all were answered. The patient agreed with the plan and demonstrated an understanding of the instructions.  A copy of instructions were sent to the patient via MyChart unless otherwise noted below.    The patient was advised to call back or seek an in-person evaluation if the symptoms worsen or if the condition fails to improve as anticipated.    Tammy Kitty, FNP

## 2023-06-27 ENCOUNTER — Ambulatory Visit (INDEPENDENT_AMBULATORY_CARE_PROVIDER_SITE_OTHER): Payer: Self-pay | Admitting: Family Medicine

## 2023-07-02 ENCOUNTER — Encounter: Payer: Self-pay | Admitting: Family Medicine

## 2023-07-02 DIAGNOSIS — M47816 Spondylosis without myelopathy or radiculopathy, lumbar region: Secondary | ICD-10-CM

## 2023-07-02 DIAGNOSIS — G8929 Other chronic pain: Secondary | ICD-10-CM

## 2023-07-05 ENCOUNTER — Ambulatory Visit (INDEPENDENT_AMBULATORY_CARE_PROVIDER_SITE_OTHER): Payer: 59 | Admitting: Family Medicine

## 2023-07-05 VITALS — BP 122/76 | HR 88 | Temp 97.6°F | Wt 271.8 lb

## 2023-07-05 DIAGNOSIS — E049 Nontoxic goiter, unspecified: Secondary | ICD-10-CM | POA: Insufficient documentation

## 2023-07-05 DIAGNOSIS — L732 Hidradenitis suppurativa: Secondary | ICD-10-CM

## 2023-07-05 DIAGNOSIS — D649 Anemia, unspecified: Secondary | ICD-10-CM

## 2023-07-05 MED ORDER — DOXYCYCLINE HYCLATE 100 MG PO TABS: 100.0000 mg | ORAL_TABLET | Freq: Two times a day (BID) | ORAL | 0 refills | Status: DC

## 2023-07-05 MED ORDER — CLOTRIMAZOLE-BETAMETHASONE 1-0.05 % EX CREA: 1.0000 | TOPICAL_CREAM | Freq: Every day | CUTANEOUS | 0 refills | Status: DC

## 2023-07-05 MED ORDER — FLUCONAZOLE 150 MG PO TABS: 150.0000 mg | ORAL_TABLET | Freq: Once | ORAL | 0 refills | Status: AC

## 2023-07-05 MED ORDER — TRIAMCINOLONE ACETONIDE 0.1 % EX CREA: 1.0000 | TOPICAL_CREAM | Freq: Two times a day (BID) | CUTANEOUS | 0 refills | Status: DC

## 2023-07-05 NOTE — Assessment & Plan Note (Signed)
I will check a TSH and T4. I will refer Tammy Hogan for a thyroid ultrasound.

## 2023-07-05 NOTE — Patient Instructions (Signed)
Hidradenitis Suppurativa    Hidradenitis suppurativa is a long-term (chronic) skin disease. It is similar to a severe form of acne, but it affects areas of the body where acne would be unusual, especially areas of the body where skin rubs against skin and becomes moist. These include:  Underarms.  Groin.  Genital area.  Buttocks.  Upper thighs.  Breasts.  Hidradenitis suppurativa may start out as small lumps or pimples caused by blocked skin pores, sweat glands, or hair follicles. Pimples may develop into deep sores that break open (rupture) and drain pus. Over time, affected areas of skin may thicken and become scarred. This condition is rare and does not spread from person to person (non-contagious).  What are the causes?  The exact cause of this condition is not known. It may be related to:  Female and female hormones.  An overactive disease-fighting system (immune system). The immune system may over-react to blocked hair follicles or sweat glands and cause swelling and pus-filled sores.  What increases the risk?  You are more likely to develop this condition if you:  Are female.  Are 23-75 years old.  Have a family history of hidradenitis suppurativa.  Have a personal history of acne.  Are overweight.  Smoke.  Take the medicine lithium.  What are the signs or symptoms?  The first symptoms are usually painful bumps in the skin, similar to pimples. The condition may get worse over time (progress), or it may only cause mild symptoms. If the disease progresses, symptoms may include:  Skin bumps getting bigger and growing deeper into the skin.  Bumps rupturing and draining pus.  Itchy, infected skin.  Skin getting thicker and scarred.  Tunnels under the skin (fistulas) where pus drains from a bump.  Pain during daily activities, such as pain during walking if your groin area is affected.  Emotional problems, such as stress or depression. This condition may affect your appearance and your ability or willingness to wear  certain clothes or do certain activities.  How is this diagnosed?  This condition is diagnosed by a health care provider who specializes in skin conditions (dermatologist). You may be diagnosed based on:  Your symptoms and medical history.  A physical exam.  Testing a pus sample for infection.  Blood tests.  How is this treated?  Your treatment will depend on how severe your symptoms are. The same treatment will not work for everybody with this condition. You may need to try several treatments to find what works best for you. Treatment may include:  Cleaning and bandaging (dressing) your wounds as needed.  Lifestyle changes, such as new skin care routines.  Taking medicines, such as:  Antibiotics.  Acne medicines.  Medicines to reduce the activity of the immune system.  A diabetes medicine (metformin).  Birth control pills, for women.  Steroids to reduce swelling and pain.  Working with a mental health care provider, if you experience emotional distress due to this condition.  If you have severe symptoms that do not get better with medicine, you may need surgery. Surgery may involve:  Using a laser to clear the skin and remove hair follicles.  Opening and draining deep sores.  Removing the areas of skin that are diseased and scarred.  Follow these instructions at home:  Medicines    Take over-the-counter and prescription medicines only as told by your health care provider.  If you were prescribed antibiotics, take them as told by your health care provider. Do not  stop using the antibiotic even if your condition improves.  Skin care  If you have open wounds, cover them with a clean dressing as told by your health care provider. Keep wounds clean by washing them gently with soap and water when you bathe.  Do not shave the areas where you get hidradenitis suppurativa.  Wear loose-fitting clothes.  Try to avoid getting overheated or sweaty. If you get sweaty or wet, change into clean, dry clothes as soon as you can.  To  help relieve pain and itchiness, cover sore areas with a warm, clean washcloth (warm compress) for 5-10 minutes as often as needed.  Your healthcare provider may recommend an antiperspirant deodorant that may be gentle on your skin.  A daily antiseptic wash to cleanse affected areas may be suggested by your healthcare provider.  General instructions  Learn as much as you can about your disease so that you have an active role in your treatment. Work closely with your health care provider to find treatments that work for you.  If you are overweight, work with your health care provider to lose weight as recommended.  Do not use any products that contain nicotine or tobacco. These products include cigarettes, chewing tobacco, and vaping devices, such as e-cigarettes. If you need help quitting, ask your health care provider.  If you struggle with living with this condition, talk with your health care provider or work with a mental health care provider as recommended.  Keep all follow-up visits.  Where to find more information  Hidradenitis Suppurativa Foundation, Inc.: www.hs-foundation.org  American Academy of Dermatology: InfoExam.si  Contact a health care provider if:  You have a flare-up of hidradenitis suppurativa.  You have a fever or chills.  You have trouble controlling your symptoms at home.  You have trouble doing your daily activities because of your symptoms.  You have trouble dealing with emotional problems related to your condition.  Summary  Hidradenitis suppurativa is a long-term (chronic) skin disease. It is similar to a severe form of acne, but it affects areas of the body where acne would be unusual.  The first symptoms are usually painful bumps in the skin, similar to pimples. The condition may only cause mild symptoms, or it may get worse over time (progress).  If you have open wounds, cover them with a clean dressing as told by your health care provider. Keep wounds clean by washing them gently with  soap and water when you bathe.  Besides skin care, treatment may include medicines, laser treatment, and surgery.  This information is not intended to replace advice given to you by your health care provider. Make sure you discuss any questions you have with your health care provider.  Document Revised: 07/19/2021 Document Reviewed: 07/19/2021  Elsevier Patient Education  2024 [employer].

## 2023-07-05 NOTE — Progress Notes (Signed)
[facility] [address] [place] [address] Dept: [phone] Dept Fax: [phone]  Office Visit  Subjective:    Patient ID: Tammy Hogan, female    DOB: [birth year], 48 y.o..   MRN: [id number]  Chief Complaint  Patient presents with   Cyst    C/o having a draining knot in Rt Armpit.  Currently on Keflex.    History of Present Illness:  Patient is in today for evaluation of a recent flare of abscesses associated with her hidradenitis suppurativa. Ms. Alver Sorrow was seen for a video visit 10 days ago related to a recent flare of abscesses in her right axilla. She notes that she did get some drainage of this. She has been on cephalexin 500 mg qid since then. She does note some skin irritation and itching in her right axilla. She had been using topical clindamycin gel in addition to the oral antibiotic. She also has noted a new abscess developing in her left axilla.   Ms. Alver Sorrow notes that her gastroenterologist had raised a concern about her having an enlarged thyroid.  Ms. Alver Sorrow notes she was told she had low iron and needed further testing. She is not taking an iron supplement.  Past Medical History: Patient Active Problem List   Diagnosis Date Noted   Thyroid enlargement 07/05/2023   Class 3 severe obesity due to excess calories with serious comorbidity and body mass index (BMI) of 40.0 to 44.9 in adult (HCC) 04/24/2023   LPRD (laryngopharyngeal reflux disease) 12/20/2022   Bilateral sacroiliitis (HCC) 11/20/2022   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   Facet arthritis of lumbar region 02/14/2022   Hidradenitis suppurativa 04/21/2021   Anal fissure 04/21/2021   Keratosis pilaris 04/21/2021   Trochanteric bursitis of right hip 08/01/2020   Carpal tunnel syndrome of right wrist 06/26/2018   Left knee tendonitis 05/11/2018   Central centrifugal scarring alopecia  11/07/2016   Gastroesophageal reflux disease 06/20/2016   Mixed hyperlipidemia 05/30/2016   Cervicalgia 05/21/2016   Generalized anxiety disorder 05/15/2016   Arthralgia of multiple joints 05/15/2016   Morbid obesity with BMI of 40.0-44.9, adult (HCC) 05/15/2016   Essential hypertension 09/07/2014   Cardiomyopathy (HCC) 07/02/2012   Systolic heart failure (HCC) 07/02/2012   Past Surgical History:  Procedure Laterality Date   CARDIAC CATHETERIZATION  feb, 2014   Dr Eldridge Dace   FOOT SURGERY  10/2019   ORIF ANKLE FRACTURE Left 07/10/2013   Procedure: OPEN REDUCTION INTERNAL FIXATION (ORIF) LEFT BIMALLEOLAR ANKLE FRACTURE;  Surgeon: Kathryne Hitch, MD;  Location: [facility];  Service: Orthopedics;  Laterality: Left;   Family History  Problem Relation Age of Onset   Hypertension Mother    Hyperlipidemia Mother    Colon polyps Mother    Other Father        drug overdose   Drug abuse Father        overdose   Hypertension Sister    Diabetes Maternal Uncle    Diabetes Maternal Grandmother    Cancer Maternal Grandmother    Heart failure Paternal Grandmother    Hypertension Paternal Grandmother    Diabetes Paternal Grandmother    Glaucoma Paternal Grandmother    Cirrhosis Paternal Grandfather    Alcohol abuse Paternal Grandfather    Cancer Other 13       breast, cervical and colon cancer   Colon cancer Neg Hx    Esophageal cancer Neg Hx    Rectal cancer Neg  Hx    Stomach cancer Neg Hx    Outpatient Medications Prior to Visit  Medication Sig Dispense Refill   AMBULATORY NON FORMULARY MEDICATION Medication Name: Diltiazem ointment 2%- apply to anal sphincter 5 times a day as instructed per Dr. Marina Goodell 30 g 3   amLODipine (NORVASC) 10 MG tablet TAKE ONE TABLET BY MOUTH DAILY 90 tablet 3   aspirin 81 MG tablet Take 81 mg by mouth daily.     atorvastatin (LIPITOR) 10 MG tablet TAKE ONE TABLET BY MOUTH DAILY 90 tablet 3   carvedilol (COREG CR) 80 MG 24 hr capsule Take 1 capsule (80  mg total) by mouth daily. 90 capsule 3   clobetasol (TEMOVATE) 0.05 % external solution Apply topically.     escitalopram (LEXAPRO) 10 MG tablet Take 1 tablet (10 mg total) by mouth daily. 90 tablet 3   fluconazole (DIFLUCAN) 150 MG tablet Take one tablet by mouth, may repeat after 72 hours if needed 2 tablet 0   furosemide (LASIX) 20 MG tablet TAKE 1 TABLET BY MOUTH DAILY 90 tablet 3   gabapentin (NEURONTIN) 300 MG capsule TAKE ONE CAPSULE BY MOUTH AT BEDTIME 90 capsule 3   hydrOXYzine (ATARAX) 25 MG tablet Take 25 mg by mouth 3 (three) times daily.     levonorgestrel (MIRENA) 20 MCG/24HR IUD 1 each by Intrauterine route once.     lisinopril (ZESTRIL) 40 MG tablet TAKE ONE TABLET BY MOUTH DAILY 90 tablet 3   minoxidil (LONITEN) 2.5 MG tablet Take by mouth.     nitroGLYCERIN (NITROSTAT) 0.4 MG SL tablet Place 1 tablet (0.4 mg total) under the tongue every 5 (five) minutes as needed for chest pain. 25 tablet 3   nystatin (MYCOSTATIN) 100000 UNIT/ML suspension Take 5 mLs (500,000 Units total) by mouth 4 (four) times daily. 60 mL 0   nystatin cream (MYCOSTATIN) Apply 1 Application topically 2 (two) times daily.     ondansetron (ZOFRAN) 4 MG tablet TAKE 1 TABLET BY MOUTH EVERY 8 HOURS AS NEEDED FOR NAUSEA AND/OR VOMITING 18 tablet 0   pantoprazole (PROTONIX) 40 MG tablet TAKE ONE TABLET BY MOUTH DAILY 90 tablet 3   spironolactone (ALDACTONE) 25 MG tablet Take 1 tablet (25 mg total) by mouth daily. 90 tablet 3   cephALEXin (KEFLEX) 500 MG capsule Take 1 capsule (500 mg total) by mouth 4 (four) times daily for 10 days. 40 capsule 0   clindamycin (CLINDAGEL) 1 % gel Apply 1 Application topically 2 (two) times daily.     fexofenadine (ALLEGRA) 180 MG tablet Take 180 mg by mouth daily.     No facility-administered medications prior to visit.   Allergies  Allergen Reactions   Percocet [Oxycodone-Acetaminophen] Nausea Only   Hydrocodone-Acetaminophen Nausea And Vomiting     Objective:   Today's  Vitals   07/05/23 1346  BP: 122/76  Pulse: 88  Temp: 97.6 F (36.4 C)  TempSrc: Temporal  SpO2: 100%  Weight: 271 lb 12.8 oz (123.3 kg)   Body mass index is 48.15 kg/m.   General: Well developed, well nourished. No acute distress. Neck: Mild fullness to the thyroid. No discrete nodules are palpable. Skin: There is an area of superficial peeling of the right axilla. The underlying skin is red and thickened, but not significantly tender. I do not feel any fluctuance and there is no drainage. There are some surrounding areas of small red, satellite lesions.   The left axilla shows a 2 cm area of redness that is tender  and slightly raised. No drainage noted at this point.  Psych: Alert and oriented. Normal mood and affect.  Health Maintenance Due  Topic Date Due   Pneumococcal Vaccine 77-25 Years old (1 of 2 - PCV) 07/11/2013     Lab Results:    Latest Ref Rng & Units 11/20/2022    8:44 AM 09/26/2022   11:33 AM 08/05/2019    5:05 PM  CBC  WBC 4.0 - 10.5 K/uL 12.9  13.9  11.2   Hemoglobin 12.0 - 15.0 g/dL 40.9  81.1  91.4   Hematocrit 36.0 - 46.0 % 36.6  37.4  39.5   Platelets 150.0 - 400.0 K/uL 444.0  582  444    Assessment & Plan:   Problem List Items Addressed This Visit       Endocrine   Thyroid enlargement   I will check a TSH and T4. I will refer Ms. Maitland for a thyroid ultrasound.      Relevant Orders   T4, free   TSH   US THYROID     Musculoskeletal and Integument   Hidradenitis suppurativa - Primary   Ms. Alver Sorrow has long-standing issues with HS. She is currently having an exacerbation with skin abscesses. I recommend we switch her antibiotics to doxycycline. The skin of the left axilla may actually have a yeast infection present. I will have her stop the clindamycin gel. I will order Lotrisone for use instead. She should hot pack her left axilla and see if this will drain. Follow-up if not improving.      Relevant Medications   doxycycline (VIBRA-TABS)  100 MG tablet   fluconazole (DIFLUCAN) 150 MG tablet   clotrimazole-betamethasone (LOTRISONE) cream   Other Visit Diagnoses       Anemia, unspecified type       Prior anemia. I will recheck a CBC and iron profile.   Relevant Orders   Iron, TIBC and Ferritin Panel   CBC       Return in about 3 months (around 10/03/2023) for Reassessment.   Loyola Mast, MD

## 2023-07-05 NOTE — Assessment & Plan Note (Signed)
[name] has long-standing issues with HS. She is currently having an exacerbation with skin abscesses. I recommend we switch her antibiotics to doxycycline. The skin of the left axilla may actually have a yeast infection present. I will have her stop the clindamycin gel. I will order Lotrisone for use instead. She should hot pack her left axilla and see if this will drain. Follow-up if not improving.

## 2023-07-06 ENCOUNTER — Other Ambulatory Visit: Payer: Self-pay | Admitting: Interventional Cardiology

## 2023-07-06 LAB — CBC
Hematocrit: 36.6 % (ref 34.0–46.6)
Hemoglobin: 11.9 g/dL (ref 11.1–15.9)
MCH: 28.3 pg (ref 26.6–33.0)
MCHC: 32.5 g/dL (ref 31.5–35.7)
MCV: 87 fL (ref 79–97)
Platelets: 548 10*3/uL — ABNORMAL HIGH (ref 150–450)
RBC: 4.21 x10E6/uL (ref 3.77–5.28)
RDW: 11.8 % (ref 11.7–15.4)
WBC: 10 10*3/uL (ref 3.4–10.8)

## 2023-07-06 LAB — IRON,TIBC AND FERRITIN PANEL
Ferritin: 104 ng/mL (ref 15–150)
Iron Saturation: 15 % (ref 15–55)
Iron: 51 ug/dL (ref 27–159)
Total Iron Binding Capacity: 344 ug/dL (ref 250–450)
UIBC: 293 ug/dL (ref 131–425)

## 2023-07-06 LAB — TSH: TSH: 1.63 u[IU]/mL (ref 0.450–4.500)

## 2023-07-06 LAB — T4, FREE: Free T4: 1.24 ng/dL (ref 0.82–1.77)

## 2023-07-08 ENCOUNTER — Encounter: Payer: Self-pay | Admitting: Family Medicine

## 2023-07-09 ENCOUNTER — Ambulatory Visit
Admission: RE | Admit: 2023-07-09 | Discharge: 2023-07-09 | Disposition: A | Discharge status: Home / Self Care | Payer: 59 | Source: Ambulatory Visit | Attending: Family Medicine | Admitting: Family Medicine

## 2023-07-09 DIAGNOSIS — E049 Nontoxic goiter, unspecified: Secondary | ICD-10-CM

## 2023-07-11 ENCOUNTER — Ambulatory Visit (INDEPENDENT_AMBULATORY_CARE_PROVIDER_SITE_OTHER): Payer: BC Managed Care – PPO | Admitting: Family Medicine

## 2023-07-12 ENCOUNTER — Ambulatory Visit: Payer: BC Managed Care – PPO | Admitting: Psychology

## 2023-07-14 ENCOUNTER — Other Ambulatory Visit: Payer: Self-pay | Admitting: Interventional Cardiology

## 2023-07-14 ENCOUNTER — Other Ambulatory Visit: Payer: Self-pay | Admitting: Family Medicine

## 2023-07-14 DIAGNOSIS — K219 Gastro-esophageal reflux disease without esophagitis: Secondary | ICD-10-CM

## 2023-07-14 DIAGNOSIS — I10 Essential (primary) hypertension: Secondary | ICD-10-CM

## 2023-07-15 ENCOUNTER — Encounter: Payer: Self-pay | Admitting: Family Medicine

## 2023-07-16 ENCOUNTER — Other Ambulatory Visit: Payer: 59

## 2023-07-18 ENCOUNTER — Ambulatory Visit (INDEPENDENT_AMBULATORY_CARE_PROVIDER_SITE_OTHER): Payer: 59 | Admitting: Family Medicine

## 2023-07-18 ENCOUNTER — Encounter (INDEPENDENT_AMBULATORY_CARE_PROVIDER_SITE_OTHER): Payer: Self-pay | Admitting: Family Medicine

## 2023-07-18 ENCOUNTER — Other Ambulatory Visit (INDEPENDENT_AMBULATORY_CARE_PROVIDER_SITE_OTHER): Payer: Self-pay | Admitting: Family Medicine

## 2023-07-18 VITALS — BP 117/77 | HR 87 | Temp 97.8°F | Ht 63.0 in | Wt 269.0 lb

## 2023-07-18 DIAGNOSIS — R0602 Shortness of breath: Secondary | ICD-10-CM

## 2023-07-18 DIAGNOSIS — E785 Hyperlipidemia, unspecified: Secondary | ICD-10-CM

## 2023-07-18 DIAGNOSIS — I110 Hypertensive heart disease with heart failure: Secondary | ICD-10-CM

## 2023-07-18 DIAGNOSIS — Z1331 Encounter for screening for depression: Secondary | ICD-10-CM | POA: Insufficient documentation

## 2023-07-18 DIAGNOSIS — I10 Essential (primary) hypertension: Secondary | ICD-10-CM

## 2023-07-18 DIAGNOSIS — I5022 Chronic systolic (congestive) heart failure: Secondary | ICD-10-CM | POA: Diagnosis not present

## 2023-07-18 DIAGNOSIS — E782 Mixed hyperlipidemia: Secondary | ICD-10-CM

## 2023-07-18 DIAGNOSIS — Z6841 Body Mass Index (BMI) 40.0 and over, adult: Secondary | ICD-10-CM

## 2023-07-18 DIAGNOSIS — R5383 Other fatigue: Secondary | ICD-10-CM | POA: Diagnosis not present

## 2023-07-18 DIAGNOSIS — L732 Hidradenitis suppurativa: Secondary | ICD-10-CM

## 2023-07-18 DIAGNOSIS — E669 Obesity, unspecified: Secondary | ICD-10-CM

## 2023-07-18 NOTE — Progress Notes (Signed)
 .smr  Office: [phone]  /  Fax: [phone]  WEIGHT SUMMARY AND BIOMETRICS  Anthropometric Measurements Height: 5' 3 (1.6 m) Weight: 269 lb (122 kg) BMI (Calculated): 47.66 Weight at Last Visit: N/A Weight Lost Since Last Visit: N/A Weight Gained Since Last Visit: N/A Starting Weight: 269 lb Waist Measurement : 50 inches   Body Composition  Body Fat %: 53.8 % Fat Mass (lbs): 144.8 lbs Muscle Mass (lbs): 118 lbs Visceral Fat Rating : 18   Other Clinical Data RMR: 1944 Fasting: Yes Labs: Yes Today's Visit #: #1 Starting Date: 07/18/23    Chief Complaint: OBESITY   Discussed the use of AI scribe software for clinical note transcription with the patient, who gave verbal consent to proceed.  History of Present Illness   Tammy Hogan is a 48 year old female with obesity, hypertension, and systolic heart failure who presents for a workup of obesity and its comorbidities. She was referred by her previous doctor for assistance in managing her weight and potentially resuming Wegovy  treatment.  She has a history of obesity with a current weight of 269 pounds and a height of 5'3, resulting in a BMI of 47.7. Her body fat percentage is 53.8% with a visceral fat rating of 18. Her highest weight was 275 pounds, and she previously lost weight while on Wegovy , which she discontinued due to insurance coverage issues. She is not currently exercising and aims to reduce her weight to 199 pounds over the next two years. She experiences excessive hunger and cravings, particularly for fried foods and candy, and reports emotional eating behaviors. She occasionally wakes up in the middle of the night hungry and usually skips breakfast. She has a positive PHQ-9 modified mood and food score of 8 and a positive Epworth sleepiness score of 10. She sleeps 5-6 hours per night, wakes up tired, and snores but does not have morning headaches. She is not interested in weight loss  surgery.  She has a history of systolic heart failure diagnosed in 2014, with an ejection fraction initially at 35-40% and hypokinesis from infarction. A follow-up echocardiogram in 2021 showed improvement with an ejection fraction of 50-55%. She has been under cardiology care since her diagnosis and reports well-controlled blood pressure with current medications. No recent heart failure symptoms but notes shortness of breath with structured exercise   Her social history includes working full-time as an Brewing Technologist and living with her husband, who is supportive of her weight loss efforts. She does not like to cook and often eats out, preferring foods like shrimp, fish, pork chops, chicken, tomato salad, Asian food, and ribs. She dislikes artificial sweeteners and liver and considers herself a picky eater. She snacks on hard candy and occasionally experiences excessive hunger and portion control issues. Her husband, who has gained weight since she started dating, often brings junk food into the house, which she finds tempting.   DIAGNOSTIC Echocardiogram: Ejection fraction 50-55%, improved function (2021) Echocardiogram: Moderate systolic heart failure, ejection fraction 35-40%, hypokinesis (2014) Indirect calorimeter: Resting metabolic rate 1944 (07/18/2023) EKG: 1 PVC, minimal LVH, ventricular rate 82 bpm, no ST elevation or T wave inversion (07/18/2023)          PHYSICAL EXAM:  Blood pressure 117/77, pulse 87, temperature 97.8 F (36.6 C), height 5' 3 (1.6 m), weight 269 lb (122 kg), SpO2 99%. Body mass index is 47.65 kg/m.  DIAGNOSTIC DATA REVIEWED:  BMET    Component Value Date/Time   NA 134 (L) 04/19/2023  1552   NA 135 09/26/2022 1133   K 4.5 04/19/2023 1552   CL 98 04/19/2023 1552   CO2 26 04/19/2023 1552   GLUCOSE 99 04/19/2023 1552   BUN 15 04/19/2023 1552   BUN 15 09/26/2022 1133   CREATININE 0.79 04/19/2023 1552   CALCIUM  9.6 04/19/2023 1552   GFRNONAA 89 08/05/2019  1705   GFRAA 102 08/05/2019 1705   Lab Results  Component Value Date   HGBA1C 5.5 09/26/2022   HGBA1C 5.4 07/03/2012   No results found for: INSULIN  Lab Results  Component Value Date   TSH 1.630 07/05/2023   CBC    Component Value Date/Time   WBC 10.0 07/05/2023 1427   WBC 12.9 (H) 11/20/2022 0844   RBC 4.21 07/05/2023 1427   RBC 3.93 11/20/2022 0844   HGB 11.9 07/05/2023 1427   HCT 36.6 07/05/2023 1427   PLT 548 (H) 07/05/2023 1427   MCV 87 07/05/2023 1427   MCH 28.3 07/05/2023 1427   MCH 29.1 07/10/2013 1528   MCHC 32.5 07/05/2023 1427   MCHC 31.6 11/20/2022 0844   RDW 11.8 07/05/2023 1427   Iron Studies    Component Value Date/Time   IRON 51 07/05/2023 1427   TIBC 344 07/05/2023 1427   FERRITIN 104 07/05/2023 1427   IRONPCTSAT 15 07/05/2023 1427   Lipid Panel     Component Value Date/Time   CHOL 155 09/26/2022 1133   TRIG 71 09/26/2022 1133   HDL 40 09/26/2022 1133   CHOLHDL 3.9 09/26/2022 1133   CHOLHDL 5 05/10/2021 0813   VLDL 30.4 05/10/2021 0813   LDLCALC 101 (H) 09/26/2022 1133   Hepatic Function Panel     Component Value Date/Time   PROT 7.1 11/20/2022 0844   PROT 7.3 09/26/2022 1133   ALBUMIN 3.8 11/20/2022 0844   ALBUMIN 4.1 09/26/2022 1133   AST 11 11/20/2022 0844   ALT 17 11/20/2022 0844   ALKPHOS 100 11/20/2022 0844   BILITOT 0.4 11/20/2022 0844   BILITOT 0.4 09/26/2022 1133   BILIDIR 0.1 11/20/2022 0844      Component Value Date/Time   TSH 1.630 07/05/2023 1427   Nutritional Lab Results  Component Value Date   VD25OH 35.51 05/10/2021   VD25OH 15.9 (L) 08/05/2019     Assessment and Plan    Obesity BMI 47.7, weight 269 lbs, fat percentage 53.8%. History of weight loss with Wegovy , discontinued due to insurance issues. Experiences excessive hunger, cravings, and emotional eating. Not currently exercising. Goal to weigh 199 lbs within two years. Discussed complexity of obesity beyond diet and exercise, including mechanisms  like excessive hunger signals, cravings, and metabolic changes. Emphasized adherence to eating plan for accurate assessment and future treatment decisions. - Initiate category two eating plan with 300 snack calories. - Provide grocery list and meal planning guide. - Advise against drinking calories; use snack calories for discretionary items. - Instruct to use food scale for protein portions. - Advise not to weigh herself at home between visits. - Follow-up in two weeks to review eating plan results and discuss further treatment options.  Fatigue and Shortness of Breath with Exercise Reports fatigue and shortness of breath with exercise. Indirect calorimeter shows resting metabolic rate of 8055 kcal/day. EKG shows 1 PVC with minimal LVH, ventricular rate 82 bpm, no ST elevation or T wave inversion. Discussed importance of light physical activity as tolerated, avoiding intense exercise. - Order labs to evaluate fatigue and shortness of breath. - Advise light physical activity as tolerated,  no intense exercise.  Systolic Heart Failure Diagnosed in 2014 with initial ejection fraction of 35-40%. Recent echocardiogram in 2021 shows improvement with ejection fraction of 50-55%. Under cardiologist care with well-controlled blood pressure. Discussed importance of weight loss and dietary changes in managing heart failure and preventing further cardiac events. - Continue current cardiac medications. - Monitor for heart failure symptoms.  Hypertension Well-controlled hypertension. Blood pressure management crucial to prevent complications such as heart attacks and strokes. - Continue current antihypertensive medications.  Hyperlipidemia Slightly elevated cholesterol. Management important to reduce cardiovascular risk. - Continue current lipid-lowering therapy.  Hidradenitis Suppurativa Condition can improve with weight loss and dietary changes. - Monitor for flare-ups and manage as  needed.  General Health Maintenance Working on lifestyle modifications including diet and exercise to improve overall health and manage comorbid conditions. Discussed importance of lifestyle changes with her and her supportive husband. - Encourage adherence to eating plan and regular follow-up visits.  Follow-up - Follow up in two weeks to review test results and eating plan outcomes. - Schedule additional follow-up in four weeks.         I have personally spent 40 minutes total time today in preparation, patient care, and documentation for this visit, including the following: review of clinical lab tests; review of medical tests/procedures/services.    She was informed of the importance of frequent follow up visits to maximize her success with intensive lifestyle modifications for her multiple health conditions.    Louann Penton, MD

## 2023-07-19 ENCOUNTER — Encounter: Payer: Self-pay | Admitting: Family Medicine

## 2023-07-19 DIAGNOSIS — E559 Vitamin D deficiency, unspecified: Secondary | ICD-10-CM | POA: Insufficient documentation

## 2023-07-19 LAB — CBC WITH DIFFERENTIAL/PLATELET
Basophils Absolute: 0 10*3/uL (ref 0.0–0.2)
Basos: 0 %
EOS (ABSOLUTE): 0.1 10*3/uL (ref 0.0–0.4)
Eos: 1 %
Hematocrit: 34.8 % (ref 34.0–46.6)
Hemoglobin: 11 g/dL — ABNORMAL LOW (ref 11.1–15.9)
Immature Grans (Abs): 0 10*3/uL (ref 0.0–0.1)
Immature Granulocytes: 1 %
Lymphocytes Absolute: 2.5 10*3/uL (ref 0.7–3.1)
Lymphs: 28 %
MCH: 28.5 pg (ref 26.6–33.0)
MCHC: 31.6 g/dL (ref 31.5–35.7)
MCV: 90 fL (ref 79–97)
Monocytes Absolute: 0.7 10*3/uL (ref 0.1–0.9)
Monocytes: 7 %
Neutrophils Absolute: 5.6 10*3/uL (ref 1.4–7.0)
Neutrophils: 63 %
Platelets: 451 10*3/uL — ABNORMAL HIGH (ref 150–450)
RBC: 3.86 x10E6/uL (ref 3.77–5.28)
RDW: 12.1 % (ref 11.7–15.4)
WBC: 8.9 10*3/uL (ref 3.4–10.8)

## 2023-07-19 LAB — CMP14+EGFR
ALT: 18 [IU]/L (ref 0–32)
AST: 15 [IU]/L (ref 0–40)
Albumin: 3.7 g/dL — ABNORMAL LOW (ref 3.9–4.9)
Alkaline Phosphatase: 124 [IU]/L — ABNORMAL HIGH (ref 44–121)
BUN/Creatinine Ratio: 14 (ref 9–23)
BUN: 11 mg/dL (ref 6–24)
Bilirubin Total: 0.3 mg/dL (ref 0.0–1.2)
CO2: 24 mmol/L (ref 20–29)
Calcium: 9 mg/dL (ref 8.7–10.2)
Chloride: 101 mmol/L (ref 96–106)
Creatinine, Ser: 0.76 mg/dL (ref 0.57–1.00)
Globulin, Total: 2.6 g/dL (ref 1.5–4.5)
Glucose: 172 mg/dL — ABNORMAL HIGH (ref 70–99)
Potassium: 4.3 mmol/L (ref 3.5–5.2)
Sodium: 139 mmol/L (ref 134–144)
Total Protein: 6.3 g/dL (ref 6.0–8.5)
eGFR: 97 mL/min/{1.73_m2} (ref >59)

## 2023-07-19 LAB — LIPID PANEL WITH LDL/HDL RATIO
Cholesterol, Total: 159 mg/dL (ref 100–199)
HDL: 36 mg/dL — ABNORMAL LOW (ref >39)
LDL Chol Calc (NIH): 102 mg/dL — ABNORMAL HIGH (ref 0–99)
LDL/HDL Ratio: 2.8 {ratio} (ref 0.0–3.2)
Triglycerides: 113 mg/dL (ref 0–149)
VLDL Cholesterol Cal: 21 mg/dL (ref 5–40)

## 2023-07-19 LAB — HEMOGLOBIN A1C
Est. average glucose Bld gHb Est-mCnc: 140 mg/dL
Hgb A1c MFr Bld: 6.5 % — ABNORMAL HIGH (ref 4.8–5.6)

## 2023-07-19 LAB — VITAMIN B12: Vitamin B-12: 694 pg/mL (ref 232–1245)

## 2023-07-19 LAB — VITAMIN D 25 HYDROXY (VIT D DEFICIENCY, FRACTURES): Vit D, 25-Hydroxy: 19.1 ng/mL — ABNORMAL LOW (ref 30.0–100.0)

## 2023-07-19 LAB — TSH: TSH: 1.74 u[IU]/mL (ref 0.450–4.500)

## 2023-07-19 LAB — INSULIN, RANDOM: INSULIN: 55.6 u[IU]/mL — ABNORMAL HIGH (ref 2.6–24.9)

## 2023-07-19 MED ORDER — VITAMIN D (ERGOCALCIFEROL) 1.25 MG (50000 UNIT) PO CAPS: 50000.0000 [IU] | ORAL_CAPSULE | ORAL | 0 refills | Status: DC

## 2023-07-22 ENCOUNTER — Ambulatory Visit: Payer: BC Managed Care – PPO | Admitting: Family Medicine

## 2023-07-30 ENCOUNTER — Ambulatory Visit: Payer: 59 | Admitting: Psychology
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
## 2023-08-01 ENCOUNTER — Encounter (INDEPENDENT_AMBULATORY_CARE_PROVIDER_SITE_OTHER): Payer: Self-pay | Admitting: Family Medicine

## 2023-08-01 ENCOUNTER — Ambulatory Visit (INDEPENDENT_AMBULATORY_CARE_PROVIDER_SITE_OTHER): Payer: Self-pay | Admitting: Family Medicine

## 2023-08-01 ENCOUNTER — Telehealth (INDEPENDENT_AMBULATORY_CARE_PROVIDER_SITE_OTHER): Payer: 59 | Admitting: Family Medicine

## 2023-08-01 DIAGNOSIS — E559 Vitamin D deficiency, unspecified: Secondary | ICD-10-CM | POA: Diagnosis not present

## 2023-08-01 DIAGNOSIS — Z6841 Body Mass Index (BMI) 40.0 and over, adult: Secondary | ICD-10-CM

## 2023-08-01 DIAGNOSIS — E782 Mixed hyperlipidemia: Secondary | ICD-10-CM

## 2023-08-01 DIAGNOSIS — E119 Type 2 diabetes mellitus without complications: Secondary | ICD-10-CM | POA: Diagnosis not present

## 2023-08-01 DIAGNOSIS — E785 Hyperlipidemia, unspecified: Secondary | ICD-10-CM | POA: Diagnosis not present

## 2023-08-01 DIAGNOSIS — E669 Obesity, unspecified: Secondary | ICD-10-CM | POA: Diagnosis not present

## 2023-08-01 DIAGNOSIS — Z7984 Long term (current) use of oral hypoglycemic drugs: Secondary | ICD-10-CM

## 2023-08-01 DIAGNOSIS — E1169 Type 2 diabetes mellitus with other specified complication: Secondary | ICD-10-CM

## 2023-08-01 MED ORDER — METFORMIN HCL 500 MG PO TABS
500.0000 mg | ORAL_TABLET | Freq: Every day | ORAL | 0 refills | Status: DC
Start: 2023-08-01 — End: 2023-08-27

## 2023-08-06 ENCOUNTER — Ambulatory Visit: Payer: 59 | Admitting: Family Medicine

## 2023-08-06 ENCOUNTER — Ambulatory Visit (INDEPENDENT_AMBULATORY_CARE_PROVIDER_SITE_OTHER): Payer: 59

## 2023-08-06 ENCOUNTER — Other Ambulatory Visit: Payer: Self-pay

## 2023-08-06 VITALS — BP 128/80 | HR 94 | Ht 63.0 in | Wt 269.0 lb

## 2023-08-06 DIAGNOSIS — M47816 Spondylosis without myelopathy or radiculopathy, lumbar region: Secondary | ICD-10-CM | POA: Diagnosis not present

## 2023-08-06 DIAGNOSIS — M25562 Pain in left knee: Secondary | ICD-10-CM | POA: Diagnosis not present

## 2023-08-06 DIAGNOSIS — G8929 Other chronic pain: Secondary | ICD-10-CM | POA: Diagnosis not present

## 2023-08-06 NOTE — Progress Notes (Signed)
 .smr  Office: [phone]  /  Fax: [phone]  WEIGHT SUMMARY AND BIOMETRICS  Anthropometric Measurements Height: 5\' 3"  (1.6 m) Weight: 269 lb (122 kg) BMI (Calculated): 47.66   No data recorded No data recorded  Chief Complaint: OBESITY  Virtual Visit via Video Note  I connected with Tammy Hogan on 08/06/23 at 10:40 AM EST by a video enabled telemedicine application and verified that I am speaking with the correct person using two identifiers.  Location: Patient: home Provider: Office   I discussed the limitations of evaluation and management by telemedicine and the availability of in person appointments. The patient expressed understanding and agreed to proceed.   Quillian Quince, MD  History of Present Illness   Tammy Welge "Robby Sermon" is a 48 year old female who presents for follow-up on her obesity treatment plan and to review lab results.  She is adhering to a category two eating plan and has achieved a weight loss of four pounds over the past two weeks, with her current weight at home being 265 pounds. She is concentrating on diet, exercise, and weight loss to enhance her health metrics.  Recent laboratory results reveal a new diagnosis of type 2 diabetes, with a hemoglobin A1c of 6.5%. Additionally, she has elevated fasting glucose and fasting insulin levels.  She has a vitamin D deficiency, which may contribute to fatigue and sluggishness.  Her laboratory results also indicate mildly elevated LDL cholesterol at 102 mg/dL.          PHYSICAL EXAM:  There were no vitals taken for this visit. There is no height or weight on file to calculate BMI.  DIAGNOSTIC DATA REVIEWED:  BMET    Component Value Date/Time   NA 139 07/18/2023 1237   K 4.3 07/18/2023 1237   CL 101 07/18/2023 1237   CO2 24 07/18/2023 1237   GLUCOSE 172 (H) 07/18/2023 1237   GLUCOSE 99 04/19/2023 1552   BUN 11 07/18/2023 1237   CREATININE 0.76 07/18/2023 1237    CREATININE 0.79 04/19/2023 1552   CALCIUM 9.0 07/18/2023 1237   GFRNONAA 89 08/05/2019 1705   GFRAA 102 08/05/2019 1705   Lab Results  Component Value Date   HGBA1C 6.5 (H) 07/18/2023   HGBA1C 5.4 07/03/2012   Lab Results  Component Value Date   INSULIN 55.6 (H) 07/18/2023   Lab Results  Component Value Date   TSH 1.740 07/18/2023   CBC    Component Value Date/Time   WBC 8.9 07/18/2023 1237   WBC 12.9 (H) 11/20/2022 0844   RBC 3.86 07/18/2023 1237   RBC 3.93 11/20/2022 0844   HGB 11.0 (L) 07/18/2023 1237   HCT 34.8 07/18/2023 1237   PLT 451 (H) 07/18/2023 1237   MCV 90 07/18/2023 1237   MCH 28.5 07/18/2023 1237   MCH 29.1 07/10/2013 1528   MCHC 31.6 07/18/2023 1237   MCHC 31.6 11/20/2022 0844   RDW 12.1 07/18/2023 1237   Iron Studies    Component Value Date/Time   IRON 51 07/05/2023 1427   TIBC 344 07/05/2023 1427   FERRITIN 104 07/05/2023 1427   IRONPCTSAT 15 07/05/2023 1427   Lipid Panel     Component Value Date/Time   CHOL 159 07/18/2023 1237   TRIG 113 07/18/2023 1237   HDL 36 (L) 07/18/2023 1237   CHOLHDL 3.9 09/26/2022 1133   CHOLHDL 5 05/10/2021 0813   VLDL 30.4 05/10/2021 0813   LDLCALC 102 (H) 07/18/2023 1237   Hepatic Function Panel  Component Value Date/Time   PROT 6.3 07/18/2023 1237   ALBUMIN 3.7 (L) 07/18/2023 1237   AST 15 07/18/2023 1237   ALT 18 07/18/2023 1237   ALKPHOS 124 (H) 07/18/2023 1237   BILITOT 0.3 07/18/2023 1237   BILIDIR 0.1 11/20/2022 0844      Component Value Date/Time   TSH 1.740 07/18/2023 1237   Nutritional Lab Results  Component Value Date   VD25OH 19.1 (L) 07/18/2023   VD25OH 35.51 05/10/2021   VD25OH 15.9 (L) 08/05/2019     Assessment and Plan    Obesity Following up for obesity management. Lost 4 pounds in the last two weeks on the category two eating plan. Current weight is 265 lbs. Emphasized the importance of diet and exercise for weight loss and overall health. - Continue category two  eating plan - Follow up in clinic in two weeks to reassess body composition, fat percentage, visceral fat, total body water, and muscle - Contact through MyChart if any questions  Type 2 Diabetes Mellitus New diagnosis with hemoglobin A1c of 6.5%. Elevated fasting glucose and insulin levels. Educated on macronutrients, lean protein, and lower sugar fruit options. Discussed the impact of fruit and juice on blood sugars and weight. Emphasized diet and exercise for diabetes management and weight loss. Starting metformin 500 mg in the morning with the first meal. - Start metformin 500 mg in the morning with the first meal - Recheck A1c in three months - Follow up in clinic in two weeks to reassess eating plan and weight loss efforts  Pure Hyperlipidemia Mildly elevated LDL at 102. Goal LDL is below 70 due to the new diagnosis of type 2 diabetes. Emphasized diet, exercise, and weight loss to improve lipid profile. - Continue to work on diet, exercise, and weight loss - Recheck LDL in three months  Vitamin D Deficiency Confirmed deficiency. Already started on prescription vitamin D 50,000 IU per week by PCP. Agreed with the current treatment plan. - Continue prescription vitamin D 50,000 IU per week - Recheck vitamin D levels in three months.      I have personally spent 40 minutes total time today in preparation, patient care, and documentation for this visit, including the following: review of clinical lab tests; review of medical tests/procedures/services.     She was informed of the importance of frequent follow up visits to maximize her success with intensive lifestyle modifications for her multiple health conditions.    Quillian Quince, MD

## 2023-08-06 NOTE — Progress Notes (Unsigned)
   Rubin Payor, PhD, LAT, ATC acting as a scribe for Clementeen Graham, MD.  Laruth Bouchard Willa Rough is a 48 y.o. female who presents to [facility] today for cont'd L knee pain. Pt was last seen by Dr. Denyse Amass on 04/10/23 and was given a L knee steroid injection.   Today, pt reports L knee pain returned a couple of weeks ago/end of Jan. She was at a concert and transitioned to stand and felt instability in her knee. No swelling, but L foot is swollen.  She also notes not being called about the back injections.  Dx imaging: 06/16/18 L knee XR  Pertinent review of systems: ***  Relevant historical information: ***   Exam:  There were no vitals taken for this visit. General: Well Developed, well nourished, and in no acute distress.   MSK: ***    Lab and Radiology Results No results found for this or any previous visit (from the past 72 hours). No results found.     Assessment and Plan: 48 y.o. female with ***   PDMP not reviewed this encounter. No orders of the defined types were placed in this encounter.  No orders of the defined types were placed in this encounter.    Discussed warning signs or symptoms. Please see discharge instructions. Patient expresses understanding.   ***

## 2023-08-06 NOTE — Patient Instructions (Addendum)
 Thank you for coming in today.  You received an injection today. Seek immediate medical attention if the joint becomes red, extremely painful, or is oozing fluid.  Please call [facility] ([facility]) at [phone] to schedule your spine injection.

## 2023-08-07 ENCOUNTER — Telehealth: Payer: Self-pay

## 2023-08-07 NOTE — Telephone Encounter (Signed)
 After not getting through to a person at [facility] on 1/25 x 3 attempts and again on 1/26 x 2. I spoke w/ a scheduler at the interventional radiology department. I was told that they would "put a note" to the spine schedule regarding pt never getting scheduled for facet injections that were ordered over a month ago.

## 2023-08-08 ENCOUNTER — Other Ambulatory Visit: Payer: Self-pay | Admitting: Family Medicine

## 2023-08-08 ENCOUNTER — Encounter: Payer: Self-pay | Admitting: Family Medicine

## 2023-08-08 DIAGNOSIS — M545 Low back pain, unspecified: Secondary | ICD-10-CM

## 2023-08-08 DIAGNOSIS — M47816 Spondylosis without myelopathy or radiculopathy, lumbar region: Secondary | ICD-10-CM

## 2023-08-13 ENCOUNTER — Other Ambulatory Visit: Payer: 59

## 2023-08-13 ENCOUNTER — Encounter: Payer: Self-pay | Admitting: Family Medicine

## 2023-08-13 ENCOUNTER — Inpatient Hospital Stay: Admission: RE | Admit: 2023-08-13 | Payer: 59 | Source: Ambulatory Visit

## 2023-08-15 NOTE — Discharge Instructions (Signed)
Radio Frequency Ablation Post Procedure Discharge Instructions ? ?May resume a regular diet and any medications that you routinely take (including pain medications). ?No driving day of procedure. ?Upon discharge go home and rest for at least 4 hours.  May use an ice pack as needed to injection sites on back. ?Remove bandades later, today. ? ? ? ?Please contact our office at [phone] for the following symptoms: ? ?Fever greater than 100 degrees ?Increased swelling, pain, or redness at injection site. ? ? ?Thank you for visiting [facility].  ?

## 2023-08-16 ENCOUNTER — Other Ambulatory Visit: Payer: Self-pay | Admitting: Family Medicine

## 2023-08-16 ENCOUNTER — Ambulatory Visit: Payer: BC Managed Care – PPO | Admitting: Psychology

## 2023-08-16 ENCOUNTER — Ambulatory Visit
Admission: RE | Admit: 2023-08-16 | Discharge: 2023-08-16 | Disposition: A | Discharge status: Home / Self Care | Payer: 59 | Source: Ambulatory Visit | Attending: Family Medicine | Admitting: Family Medicine

## 2023-08-16 DIAGNOSIS — F331 Major depressive disorder, recurrent, moderate: Secondary | ICD-10-CM | POA: Diagnosis not present

## 2023-08-16 DIAGNOSIS — G8929 Other chronic pain: Secondary | ICD-10-CM

## 2023-08-16 DIAGNOSIS — M47816 Spondylosis without myelopathy or radiculopathy, lumbar region: Secondary | ICD-10-CM

## 2023-08-16 DIAGNOSIS — M545 Low back pain, unspecified: Secondary | ICD-10-CM

## 2023-08-16 MED ORDER — MIDAZOLAM HCL 2 MG/2ML IJ SOLN
1.0000 mg | INTRAMUSCULAR | Status: DC | PRN
  Administered 2023-08-16 (×2): 1 mg via INTRAVENOUS

## 2023-08-16 MED ORDER — SODIUM CHLORIDE 0.9 % IV SOLN: INTRAVENOUS | Status: DC

## 2023-08-16 MED ORDER — FENTANYL CITRATE PF 50 MCG/ML IJ SOSY
25.0000 ug | PREFILLED_SYRINGE | INTRAMUSCULAR | Status: DC | PRN
  Administered 2023-08-16: 50 ug via INTRAVENOUS
  Administered 2023-08-16: 25 ug via INTRAVENOUS
  Administered 2023-08-16: 50 ug via INTRAVENOUS

## 2023-08-16 MED ORDER — METHYLPREDNISOLONE ACETATE 40 MG/ML INJ SUSP (RADIOLOG
120.0000 mg | Freq: Once | INTRAMUSCULAR | Status: AC
  Administered 2023-08-16: 120 mg via INTRALESIONAL

## 2023-08-16 MED ORDER — KETOROLAC TROMETHAMINE 30 MG/ML IJ SOLN
30.0000 mg | Freq: Once | INTRAMUSCULAR | Status: AC
  Administered 2023-08-16: 30 mg via INTRAVENOUS

## 2023-08-16 NOTE — Progress Notes (Signed)
 [facility] Counselor/Therapist Progress Note  Patient ID: Tammy Hogan, MRN: [id number],    Date: 08/16/2023  Time Spent: 2:00pm-2:45pm   45 minutes   Treatment Type: Individual Therapy  Reported Symptoms: stress  Mental Status Exam: Appearance:  Casual     Behavior: Appropriate  Motor: Normal  Speech/Language:  Normal Rate  Affect: Appropriate  Mood: normal  Thought process: normal  Thought content:   WNL  Sensory/Perceptual disturbances:   WNL  Orientation: oriented to person, place, time/date, and situation  Attention: Good  Concentration: Good  Memory: WNL  Fund of knowledge:  Good  Insight:   Good  Judgment:  Good  Impulse Control: Good   Risk Assessment: Danger to Self:  No Self-injurious Behavior: No Danger to Others: No Duty to Warn:no Physical Aggression / Violence:No  Access to Firearms a concern: No  Gang Involvement:No   Subjective: Pt present for face-to-face individual therapy via video.  Pt consents to telehealth video session and is aware of limitations and benefits of virtual sessions. Location of pt: home Location of therapist: home office.   Pt talked about her job.  She is in her new job at [employer].  She states it is quiet and boring.   She has not found anyone to connect with there which is disappointing.  Pt will be working a  hybrid schedule of in person and remote.  Pt is adjusting to the job.  She states her supervisor is nice which is important to her. Pt talked about starting treatment at [facility].  She got blood work there and was told she is pre diabetic.  This was very upsetting for pt bc her grandmother died of diabetes and pt has always been afraid of getting diabetes.   Pt is struggling with the diet she was given.  Addressed pt's concerns. Pt talked about planning a family get together for July 4th.   Pt tends to overplan and goes over board with games and food.   Pt and husband  Tammy Hogan got in an argument about pt's family bc Tammy Hogan feels pt's family can be difficult.   Helped pt process her feelings and family dynamics.   Worked on self care strategies.   Provided supportive therapy.    Interventions: Cognitive Behavioral Therapy and Insight-Oriented  Diagnosis: F33.1  Plan of Care: Recommend ongoing therapy.   Pt participated in setting treatment goals.    Pt states she wants to "find some peace".   Pt wants to improve coping skills.   Plan to meet every two weeks.   Pt agrees with treatment plan.   Treatment Plan Client Abilities/Strengths  Pt is bright, engaging, and motivated for therapy.   Client Treatment Preferences  Individual therapy.  Client Statement of Needs  Improve coping skills.  Symptoms  Depressed or irritable mood. Sleeplessness or hypersomnia. Poor concentration and indecisiveness. Low self-esteem.  Problems Addressed  Unipolar Depression Goals 1. Alleviate depressive symptoms and return to previous level of effective functioning. 2. Appropriately grieve the loss in order to normalize mood and to return to previously adaptive level of functioning. Objective Learn and implement behavioral strategies to overcome depression. Target Date: 2024-04-08 Frequency: Biweekly  Progress: 10 Modality: individual  Related Interventions Engage the client in "behavioral activation," increasing his/her activity level and contact with sources of reward, while identifying processes that inhibit activation.  Use behavioral techniques such as instruction, rehearsal, role-playing, role reversal, as needed, to facilitate activity in the client's  daily life; reinforce success. Assist the client in developing skills that increase the likelihood of deriving pleasure from behavioral activation (e.g., assertiveness skills, developing an exercise plan, less internal/more external focus, increased social involvement); reinforce success. Objective Identify important  people in life, past and present, and describe the quality, good and poor, of those relationships. Target Date: 2024-04-08 Frequency: Biweekly  Progress: 10 Modality: individual  Related Interventions Conduct Interpersonal Therapy beginning with the assessment of the client's "interpersonal inventory" of important past and present relationships; develop a case formulation linking depression to grief, interpersonal role disputes, role transitions, and/or interpersonal deficits). Objective Learn and implement problem-solving and decision-making skills. Target Date: 2024-04-08 Frequency: Biweekly  Progress: 10 Modality: individual  Related Interventions Conduct Problem-Solving Therapy using techniques such as psychoeducation, modeling, and role-playing to teach client problem-solving skills (i.e., defining a problem specifically, generating possible solutions, evaluating the pros and cons of each solution, selecting and implementing a plan of action, evaluating the efficacy of the plan, accepting or revising the plan); role-play application of the problem-solving skill to a real life issue. Encourage in the client the development of a positive problem orientation in which problems and solving them are viewed as a natural part of life and not something to be feared, despaired, or avoided. 3. Develop healthy interpersonal relationships that lead to the alleviation and help prevent the relapse of depression. 4. Develop healthy thinking patterns and beliefs about self, others, and the world that lead to the alleviation and help prevent the relapse of depression. 5. Recognize, accept, and cope with feelings of depression. Diagnosis F33.1  Conditions For Discharge Achievement of treatment goals and objectives   Salomon Fick, LCSW

## 2023-08-16 NOTE — Progress Notes (Signed)
 Pt back in nursing recovery area. Pt still drowsy from procedure but will wake up when spoken to. Pt follows commands, talks in complete sentences and has no complaints at this time. Pt will remain in nurses station until discharged by Radiologist.

## 2023-08-18 NOTE — Progress Notes (Unsigned)
 Cardiology Office Note   Date:  08/18/2023   ID:  Tammy Hogan [birth year], MRN [id number]  PCP:  Loyola Mast, MD  Cardiologist:   Dietrich Pates, MD       History of Present Illness: Tammy Hogan is a 48 y.o. female with a history of       No outpatient medications have been marked as taking for the 08/19/23 encounter (Appointment) with Pricilla Riffle, MD.     Allergies:   Percocet [oxycodone-acetaminophen] and Hydrocodone-acetaminophen   Past Medical History:  Diagnosis Date   Anal fissure    Anemia    past hx    Anginal pain (HCC)    Anxiety    [place], [place]   Anxiety    Back pain    Bimalleolar fracture of left ankle 07/10/2013   CHF (congestive heart failure) (HCC)    systolic heart failure 2014   Constipation    Depression    Depression    GERD (gastroesophageal reflux disease)    Heartburn    High blood pressure    High cholesterol    History of cardiac cath 07/23/2012   Dr Eldridge Dace   History of echocardiogram    Cardiomyopathy-reduced EF 35-40% with wall m otion abnormality concerning for ischemia. Hypokinesis of the anterior and anteroseptal walls to the apex   HPV (human papilloma virus) infection    Hyperlipidemia    Hypertension    Joint pain    Palpitations    PCOS (polycystic ovarian syndrome)    pt denies   PPD positive, treated 2008   [place], [place]    Past Surgical History:  Procedure Laterality Date   CARDIAC CATHETERIZATION  feb, 2014   Dr Eldridge Dace   FOOT SURGERY  10/2019   ORIF ANKLE FRACTURE Left 07/10/2013   Procedure: OPEN REDUCTION INTERNAL FIXATION (ORIF) LEFT BIMALLEOLAR ANKLE FRACTURE;  Surgeon: Kathryne Hitch, MD;  Location: [facility];  Service: Orthopedics;  Laterality: Left;     Social History:  The patient  reports that she quit smoking about 2 years ago. Her smoking use included cigarettes. She started smoking about 18 years ago. She has a 8 pack-year smoking history. She has never used  smokeless tobacco. She reports that she does not currently use alcohol. She reports that she does not use drugs.   Family History:  The patient's family history includes Alcohol abuse in her mother and paternal grandfather; Anxiety disorder in her mother; Cancer in her maternal grandmother; Cancer (age of onset: 5) in an other family member; Cirrhosis in her paternal grandfather; Colon polyps in her mother; Depression in her mother; Diabetes in her maternal grandmother, maternal uncle, and paternal grandmother; Drug abuse in her father; Glaucoma in her paternal grandmother; Heart failure in her paternal grandmother; Hyperlipidemia in her mother; Hypertension in her mother, paternal grandmother, and sister; Other in her father.    ROS:  Please see the history of present illness. All other systems are reviewed and  Negative to the above problem except as noted.    PHYSICAL EXAM: VS:  There were no vitals taken for this visit.  GEN: Well nourished, well developed, in no acute distress  HEENT: normal  Neck: no JVD, carotid bruits, or masses Cardiac: RRR; no murmurs, rubs, or gallops,no edema  Respiratory:  clear to auscultation bilaterally, normal work of breathing GI: soft, nontender, nondistended, + BS  No hepatomegaly  MS: no deformity Moving all extremities   Skin: warm  and dry, no rash Neuro:  Strength and sensation are intact Psych: euthymic mood, full affect   EKG:  EKG is ordered today.   Lipid Panel    Component Value Date/Time   CHOL 159 07/18/2023 1237   TRIG 113 07/18/2023 1237   HDL 36 (L) 07/18/2023 1237   CHOLHDL 3.9 09/26/2022 1133   CHOLHDL 5 05/10/2021 0813   VLDL 30.4 05/10/2021 0813   LDLCALC 102 (H) 07/18/2023 1237      Wt Readings from Last 3 Encounters:  08/06/23 269 lb (122 kg)  07/18/23 269 lb (122 kg)  07/05/23 271 lb 12.8 oz (123.3 kg)      ASSESSMENT AND PLAN:     Current medicines are reviewed at length with the patient today.  The patient  does not have concerns regarding medicines.  Signed, Dietrich Pates, MD  08/18/2023 2:44 PM    [facility] [address] [place], [place], [place]  [address] Phone: [phone]; Fax: [phone]

## 2023-08-19 ENCOUNTER — Ambulatory Visit: Payer: BC Managed Care – PPO | Attending: Internal Medicine | Admitting: Internal Medicine

## 2023-08-19 ENCOUNTER — Encounter: Payer: Self-pay | Admitting: Internal Medicine

## 2023-08-19 VITALS — BP 116/84 | HR 86 | Ht 63.0 in | Wt 260.0 lb

## 2023-08-19 DIAGNOSIS — I428 Other cardiomyopathies: Secondary | ICD-10-CM

## 2023-08-19 NOTE — Patient Instructions (Signed)
 Medication Instructions:   *If you need a refill on your cardiac medications before your next appointment, please call your pharmacy*   Lab Work:  If you have labs (blood work) drawn today and your tests are completely normal, you will receive your results only by: MyChart Message (if you have MyChart) OR A paper copy in the mail If you have any lab test that is abnormal or we need to change your treatment, we will call you to review the results.   Testing/Procedures: Your physician has requested that you have an echocardiogram. Echocardiography is a painless test that uses sound waves to create images of your heart. It provides your doctor with information about the size and shape of your heart and how well your heart's chambers and valves are working. This procedure takes approximately one hour. There are no restrictions for this procedure. Please do NOT wear cologne, perfume, aftershave, or lotions (deodorant is allowed). Please arrive 15 minutes prior to your appointment time.  Please note: We ask at that you not bring children with you during ultrasound (echo/ vascular) testing. Due to room size and safety concerns, children are not allowed in the ultrasound rooms during exams. Our front office staff cannot provide observation of children in our lobby area while testing is being conducted. An adult accompanying a patient to their appointment will only be allowed in the ultrasound room at the discretion of the ultrasound technician under special circumstances. We apologize for any inconvenience.    Follow-Up: At [facility], you and your health needs are our priority.  As part of our continuing mission to provide you with exceptional heart care, we have created designated Provider Care Teams.  These Care Teams include your primary Cardiologist (physician) and Advanced Practice Providers (APPs -  Physician Assistants and Nurse Practitioners) who all work together to provide you  with the care you need, when you need it.  We recommend signing up for the patient portal called "MyChart".  Sign up information is provided on this After Visit Summary.  MyChart is used to connect with patients for Virtual Visits (Telemedicine).  Patients are able to view lab/test results, encounter notes, upcoming appointments, etc.  Non-urgent messages can be sent to your provider as well.   To learn more about what you can do with MyChart, go to [email or web address].    Your next appointment: early summer 2025

## 2023-08-20 ENCOUNTER — Encounter: Payer: Self-pay | Admitting: Family Medicine

## 2023-08-20 ENCOUNTER — Ambulatory Visit (INDEPENDENT_AMBULATORY_CARE_PROVIDER_SITE_OTHER): Payer: 59 | Admitting: Family Medicine

## 2023-08-20 ENCOUNTER — Encounter (INDEPENDENT_AMBULATORY_CARE_PROVIDER_SITE_OTHER): Payer: Self-pay | Admitting: Family Medicine

## 2023-08-20 VITALS — BP 142/82 | HR 87 | Temp 98.2°F | Ht 63.0 in | Wt 259.0 lb

## 2023-08-20 DIAGNOSIS — E119 Type 2 diabetes mellitus without complications: Secondary | ICD-10-CM | POA: Diagnosis not present

## 2023-08-20 DIAGNOSIS — I428 Other cardiomyopathies: Secondary | ICD-10-CM | POA: Diagnosis not present

## 2023-08-20 DIAGNOSIS — E1169 Type 2 diabetes mellitus with other specified complication: Secondary | ICD-10-CM

## 2023-08-20 DIAGNOSIS — G8929 Other chronic pain: Secondary | ICD-10-CM

## 2023-08-20 DIAGNOSIS — Z7984 Long term (current) use of oral hypoglycemic drugs: Secondary | ICD-10-CM

## 2023-08-20 DIAGNOSIS — Z6841 Body Mass Index (BMI) 40.0 and over, adult: Secondary | ICD-10-CM

## 2023-08-20 NOTE — Progress Notes (Signed)
 Office: [phone]  /  Fax: [phone]  WEIGHT SUMMARY AND BIOMETRICS  Anthropometric Measurements Height: 5\' 3"  (1.6 m) Weight: 259 lb (117.5 kg) BMI (Calculated): 45.89 Weight at Last Visit: 269 lb Weight Lost Since Last Visit: 10 lb Weight Gained Since Last Visit: 0 Total Weight Loss (lbs): 13 lb (5.897 kg)   Body Composition  Body Fat %: 52.1 % Fat Mass (lbs): 135.4 lbs Muscle Mass (lbs): 118.2 lbs Total Body Water (lbs): 95.2 lbs Visceral Fat Rating : 17   Other Clinical Data Fasting: no Labs: no Today's Visit #: 3 Starting Date: 08/01/23    Chief Complaint: OBESITY   Discussed the use of AI scribe software for clinical note transcription with the patient, who gave verbal consent to proceed.  History of Present Illness   The patient presents for obesity treatment and progress monitoring.  Since her last in-office visit on February 6th, she has lost ten pounds. She has been following her category two eating plan about 70% of the time. Exercise has not yet been assigned.  She has a relatively new diagnosis of type 2 diabetes with a hemoglobin A1c of 6.5 in February. Last month, she was started on low dose metformin and reports no problems with the medication, continuing to take the same amount.  She discusses her dietary habits, noting struggles with getting enough protein and vegetables. She often eats just meat for dinner, influenced by her husband's preferences. She inquires about using protein drinks as a supplement. She mentions eating eggs, yogurt, and prunes, and is concerned about the sugar content in prunes affecting her blood sugar. She misses eating rice and prefers high-sugar fruits like cherries, mangoes, and grapes. She is trying to adjust her diet to include more low-sugar fruits like blueberries and strawberries.  She mentions a recent appointment with her cardiologist, who questioned her dietary restrictions on healthy fats like avocado and  olive oil. She is aware of the caloric content of these foods and is trying to balance her diet accordingly.          PHYSICAL EXAM:  Blood pressure (!) 142/82, pulse 87, temperature 98.2 F (36.8 C), height 5\' 3"  (1.6 m), weight 259 lb (117.5 kg), SpO2 97%. Body mass index is 45.88 kg/m.  DIAGNOSTIC DATA REVIEWED:  BMET    Component Value Date/Time   NA 139 07/18/2023 1237   K 4.3 07/18/2023 1237   CL 101 07/18/2023 1237   CO2 24 07/18/2023 1237   GLUCOSE 172 (H) 07/18/2023 1237   GLUCOSE 99 04/19/2023 1552   BUN 11 07/18/2023 1237   CREATININE 0.76 07/18/2023 1237   CREATININE 0.79 04/19/2023 1552   CALCIUM 9.0 07/18/2023 1237   GFRNONAA 89 08/05/2019 1705   GFRAA 102 08/05/2019 1705   Lab Results  Component Value Date   HGBA1C 6.5 (H) 07/18/2023   HGBA1C 5.4 07/03/2012   Lab Results  Component Value Date   INSULIN 55.6 (H) 07/18/2023   Lab Results  Component Value Date   TSH 1.740 07/18/2023   CBC    Component Value Date/Time   WBC 8.9 07/18/2023 1237   WBC 12.9 (H) 11/20/2022 0844   RBC 3.86 07/18/2023 1237   RBC 3.93 11/20/2022 0844   HGB 11.0 (L) 07/18/2023 1237   HCT 34.8 07/18/2023 1237   PLT 451 (H) 07/18/2023 1237   MCV 90 07/18/2023 1237   MCH 28.5 07/18/2023 1237   MCH 29.1 07/10/2013 1528   MCHC 31.6 07/18/2023 1237   MCHC  31.6 11/20/2022 0844   RDW 12.1 07/18/2023 1237   Iron Studies    Component Value Date/Time   IRON 51 07/05/2023 1427   TIBC 344 07/05/2023 1427   FERRITIN 104 07/05/2023 1427   IRONPCTSAT 15 07/05/2023 1427   Lipid Panel     Component Value Date/Time   CHOL 159 07/18/2023 1237   TRIG 113 07/18/2023 1237   HDL 36 (L) 07/18/2023 1237   CHOLHDL 3.9 09/26/2022 1133   CHOLHDL 5 05/10/2021 0813   VLDL 30.4 05/10/2021 0813   LDLCALC 102 (H) 07/18/2023 1237   Hepatic Function Panel     Component Value Date/Time   PROT 6.3 07/18/2023 1237   ALBUMIN 3.7 (L) 07/18/2023 1237   AST 15 07/18/2023 1237   ALT 18  07/18/2023 1237   ALKPHOS 124 (H) 07/18/2023 1237   BILITOT 0.3 07/18/2023 1237   BILIDIR 0.1 11/20/2022 0844      Component Value Date/Time   TSH 1.740 07/18/2023 1237   Nutritional Lab Results  Component Value Date   VD25OH 19.1 (L) 07/18/2023   VD25OH 35.51 05/10/2021   VD25OH 15.9 (L) 08/05/2019     Assessment and Plan    Type 2 Diabetes Mellitus Type 2 diabetes with a hemoglobin A1c of 6.5 in February. Initiated on low-dose metformin last month with no reported issues. Discussed the impact of high glycemic foods on blood glucose and the importance of carbohydrate management. Emphasized real food protein sources and blood glucose monitoring. Explained that high sugar foods can trigger significant insulin responses, increasing fat storage and hunger. - Continue low-dose metformin, refill today - Monitor blood glucose levels - Reduce intake of high glycemic foods and juices - Prioritize real food protein sources -intensive nutritional counseling done today  Obesity Actively pursuing weight loss, achieving a 10-pound reduction since February 6th. Adhering to a category two eating plan 70% of the time. Highlighted the importance of lean protein and vegetable intake, preferring real food protein over supplements. Protein shakes should be used sparingly Discussed calorie and protein management to support weight loss while preserving muscle mass. Real food protein enhances caloric expenditure during digestion compared to protein supplements (thermogenic effect of protein). Advised using My Fitness Pal to track 1500 calories and 100 grams of protein daily for a 1-pound weekly fat loss. - Continue category two eating plan - Ensure adequate protein and vegetable intake - Use protein shakes only in emergencies - Maintain caloric deficit for weight loss - Utilize My Fitness Pal for tracking - Provide list of protein-rich foods and meal ideas - Journal food intake and challenges -Will  review food journal in depth at follow up visit.  Cardiovascular Health with NIC[facility] Recent cardiology consultation recommended incorporating healthy fats like avocado and olive oil. Recognized cardiovascular benefits of these healthy fats but stressed calorie management for weight loss is important and fats, even healthy fats, will increase her caloric intake enough to slow down or even stop her weight loss efforts. Noted high caloric content of olive oil and nuts, which can contribute to weight gain if not moderated. Open to coordinating care plan with cardiologist. - Patient to concentrate on meeting her caloric and protein guidelines first, and may work in healthy fats with remaining calories -She was encouraged to decrease her simple carbohydrates as this will help improve her glucose control and thus decrease cardiac risk  - I am happy to coordinate care plan with cardiology if they wish.  Follow-up 2 weeks  I have personally spent 65 minutes total time today in preparation, patient care, and documentation for this visit, including the following: review of clinical lab tests; review of medical tests/procedures/services.    She was informed of the importance of frequent follow up visits to maximize her success with intensive lifestyle modifications for her multiple health conditions.    Quillian Quince, MD

## 2023-08-20 NOTE — Progress Notes (Signed)
Left knee x-ray shows a little bit of arthritis in the knee.

## 2023-08-21 NOTE — Telephone Encounter (Signed)
 Pt had inj 08/06/23. Forwarding to Dr. Denyse Amass to review and advise.

## 2023-08-27 ENCOUNTER — Other Ambulatory Visit (INDEPENDENT_AMBULATORY_CARE_PROVIDER_SITE_OTHER): Payer: Self-pay

## 2023-08-27 ENCOUNTER — Other Ambulatory Visit (INDEPENDENT_AMBULATORY_CARE_PROVIDER_SITE_OTHER): Payer: Self-pay | Admitting: Family Medicine

## 2023-08-27 DIAGNOSIS — E1169 Type 2 diabetes mellitus with other specified complication: Secondary | ICD-10-CM

## 2023-08-27 MED ORDER — METFORMIN HCL 500 MG PO TABS: 500.0000 mg | ORAL_TABLET | Freq: Every day | ORAL | 0 refills | Status: DC

## 2023-08-27 NOTE — Telephone Encounter (Signed)
 LAST APPOINTMENT DATE: 08/20/2023 NEXT APPOINTMENT DATE: 09/04/2023   [facility] [id number], [place] - [address] [place] [address] Phone: [phone] Fax: [phone]  [id number] - [place], [place] - [address] [place] [address] Phone: [phone] Fax: [phone]  Patient is requesting a refill of the following medications: Requested Prescriptions   Pending Prescriptions Disp Refills   metFORMIN (GLUCOPHAGE) 500 MG tablet 30 tablet 0    Sig: Take 1 tablet (500 mg total) by mouth daily.   Refused Prescriptions Disp Refills   metFORMIN (GLUCOPHAGE) 500 MG tablet [Pharmacy Med Name: METFORMIN HCL 500 MG TABLET] 30 tablet 0    Sig: TAKE 1 TABLET BY MOUTH DAILY    Refused By: Magda Paganini L    Reason for Refusal: Refill not appropriate    Date last filled: 08/01/2023 Previously prescribed by Dr Dalbert Garnet  Lab Results  Component Value Date   HGBA1C 6.5 (H) 07/18/2023   HGBA1C 5.5 09/26/2022   HGBA1C 5.6 05/10/2021   Lab Results  Component Value Date   LDLCALC 102 (H) 07/18/2023   CREATININE 0.76 07/18/2023   Lab Results  Component Value Date   VD25OH 19.1 (L) 07/18/2023   VD25OH 35.51 05/10/2021   VD25OH 15.9 (L) 08/05/2019    BP Readings from Last 3 Encounters:  08/20/23 (!) 142/82  08/19/23 116/84  08/16/23 118/60

## 2023-08-27 NOTE — Addendum Note (Signed)
 Addended by: Teressa Senter on: 08/27/2023 07:33 AM   Modules accepted: Orders

## 2023-09-02 ENCOUNTER — Other Ambulatory Visit

## 2023-09-03 ENCOUNTER — Ambulatory Visit
Admission: RE | Admit: 2023-09-03 | Discharge: 2023-09-03 | Disposition: A | Discharge status: Home / Self Care | Source: Ambulatory Visit | Attending: Family Medicine | Admitting: Family Medicine

## 2023-09-03 DIAGNOSIS — G8929 Other chronic pain: Secondary | ICD-10-CM

## 2023-09-04 ENCOUNTER — Ambulatory Visit (INDEPENDENT_AMBULATORY_CARE_PROVIDER_SITE_OTHER): Payer: 59 | Admitting: Family Medicine

## 2023-09-04 ENCOUNTER — Encounter (INDEPENDENT_AMBULATORY_CARE_PROVIDER_SITE_OTHER): Payer: Self-pay | Admitting: Family Medicine

## 2023-09-04 VITALS — BP 138/84 | HR 91 | Temp 98.0°F | Ht 63.0 in | Wt 255.0 lb

## 2023-09-04 DIAGNOSIS — E669 Obesity, unspecified: Secondary | ICD-10-CM

## 2023-09-04 DIAGNOSIS — E119 Type 2 diabetes mellitus without complications: Secondary | ICD-10-CM | POA: Diagnosis not present

## 2023-09-04 DIAGNOSIS — K5900 Constipation, unspecified: Secondary | ICD-10-CM

## 2023-09-04 DIAGNOSIS — I10 Essential (primary) hypertension: Secondary | ICD-10-CM

## 2023-09-04 DIAGNOSIS — E1169 Type 2 diabetes mellitus with other specified complication: Secondary | ICD-10-CM

## 2023-09-04 DIAGNOSIS — Z6841 Body Mass Index (BMI) 40.0 and over, adult: Secondary | ICD-10-CM

## 2023-09-04 DIAGNOSIS — Z7984 Long term (current) use of oral hypoglycemic drugs: Secondary | ICD-10-CM

## 2023-09-04 MED ORDER — METFORMIN HCL 500 MG PO TABS: 500.0000 mg | ORAL_TABLET | Freq: Every day | ORAL | 0 refills | Status: DC

## 2023-09-04 NOTE — Progress Notes (Signed)
 Office: [phone]  /  Fax: [phone]  WEIGHT SUMMARY AND BIOMETRICS  Anthropometric Measurements Height: 5\' 3"  (1.6 m) Weight: 255 lb (115.7 kg) BMI (Calculated): 45.18 Weight at Last Visit: 259 lb Weight Lost Since Last Visit: 4 lb Weight Gained Since Last Visit: 0 Starting Weight: 269 lb Total Weight Loss (lbs): 14 lb (6.35 kg)   Body Composition  Body Fat %: 50.5 % Fat Mass (lbs): 129.2 lbs Muscle Mass (lbs): 16120.2 lbs Total Body Water (lbs): 91.2 lbs Visceral Fat Rating : 16   Other Clinical Data Fasting: No Labs: No Today's Visit #: 4 Starting Date: 07/18/23    Chief Complaint: OBESITY    History of Present Illness   Tammy Hogan is a 48 year old female who presents for obesity treatment plan and progress assessment.  She has been adhering to her category two eating plan approximately seventy percent of the time, resulting in a weight loss of four pounds over the past two weeks. She has not yet initiated an exercise regimen. She is mindful of her caloric intake, generally maintaining a daily intake of 1500 calories, although she consumed 2200 calories on a day spent in [place]. She journals her food intake and includes fruit-infused water and a cranberry almond kind bar as snacks. Meal prepping on Sundays aids in managing her diet throughout the week.  She experiences constipation, which she attributes to recent air travel. She has not had a bowel movement since Saturday. She has attempted to alleviate this by consuming prunes, but due to concerns about sugar content, she only consumed two. She plans to use milk of magnesia and has been increasing her water intake to address the constipation.  Her blood pressure was elevated today at 138/94. She is currently on amlodipine, carvedilol, hydroxyzine, lisinopril, and Aldactone for hypertension management.  No allergies.          PHYSICAL EXAM:  Blood pressure 138/84, pulse 91, temperature 98  F (36.7 C), height 5\' 3"  (1.6 m), weight 255 lb (115.7 kg), SpO2 98%. Body mass index is 45.17 kg/m.  DIAGNOSTIC DATA REVIEWED:  BMET    Component Value Date/Time   NA 139 07/18/2023 1237   K 4.3 07/18/2023 1237   CL 101 07/18/2023 1237   CO2 24 07/18/2023 1237   GLUCOSE 172 (H) 07/18/2023 1237   GLUCOSE 99 04/19/2023 1552   BUN 11 07/18/2023 1237   CREATININE 0.76 07/18/2023 1237   CREATININE 0.79 04/19/2023 1552   CALCIUM 9.0 07/18/2023 1237   GFRNONAA 89 08/05/2019 1705   GFRAA 102 08/05/2019 1705   Lab Results  Component Value Date   HGBA1C 6.5 (H) 07/18/2023   HGBA1C 5.4 07/03/2012   Lab Results  Component Value Date   INSULIN 55.6 (H) 07/18/2023   Lab Results  Component Value Date   TSH 1.740 07/18/2023   CBC    Component Value Date/Time   WBC 8.9 07/18/2023 1237   WBC 12.9 (H) 11/20/2022 0844   RBC 3.86 07/18/2023 1237   RBC 3.93 11/20/2022 0844   HGB 11.0 (L) 07/18/2023 1237   HCT 34.8 07/18/2023 1237   PLT 451 (H) 07/18/2023 1237   MCV 90 07/18/2023 1237   MCH 28.5 07/18/2023 1237   MCH 29.1 07/10/2013 1528   MCHC 31.6 07/18/2023 1237   MCHC 31.6 11/20/2022 0844   RDW 12.1 07/18/2023 1237   Iron Studies    Component Value Date/Time   IRON 51 07/05/2023 1427   TIBC 344 07/05/2023  1427   FERRITIN 104 07/05/2023 1427   IRONPCTSAT 15 07/05/2023 1427   Lipid Panel     Component Value Date/Time   CHOL 159 07/18/2023 1237   TRIG 113 07/18/2023 1237   HDL 36 (L) 07/18/2023 1237   CHOLHDL 3.9 09/26/2022 1133   CHOLHDL 5 05/10/2021 0813   VLDL 30.4 05/10/2021 0813   LDLCALC 102 (H) 07/18/2023 1237   Hepatic Function Panel     Component Value Date/Time   PROT 6.3 07/18/2023 1237   ALBUMIN 3.7 (L) 07/18/2023 1237   AST 15 07/18/2023 1237   ALT 18 07/18/2023 1237   ALKPHOS 124 (H) 07/18/2023 1237   BILITOT 0.3 07/18/2023 1237   BILIDIR 0.1 11/20/2022 0844      Component Value Date/Time   TSH 1.740 07/18/2023 1237   Nutritional Lab  Results  Component Value Date   VD25OH 19.1 (L) 07/18/2023   VD25OH 35.51 05/10/2021   VD25OH 15.9 (L) 08/05/2019     Assessment and Plan    Obesity She is actively working on weight loss, having lost four pounds in the last two weeks. She adheres to her category two eating plan approximately 70% of the time and is not yet ready to initiate exercise. She is mindful of her calorie intake, journals her food consumption, and incorporates healthy eating habits, such as choosing grilled chicken and steamed vegetables when dining out. She is aware of the importance of protein intake and experiments with fruit-infused water and healthy snacks. Commendable progress is noted. - Continue category two eating plan - Encourage journaling of food intake - Incorporate healthy snacks and meals - Consider starting exercise when ready  Diabetes Mellitus She manages her condition with metformin and dietary changes, being mindful of carbohydrate intake and using the My Fitness Pal app to track nutrition. Guidance was provided on maintaining a balanced macronutrient intake, emphasizing that meeting calorie and protein goals ensures carbohydrates are not excessive. - Refill metformin - Continue using My Fitness Pal app to track nutrition - Maintain balanced macronutrient intake  Hypertension Blood pressure was elevated at 138/94. She is on multiple antihypertensive medications, including amlodipine, carvedilol, hydroxyzine, lisinopril, and Aldactone. Blood pressure has improved compared to the last visit, where it was 142/82. Continued weight loss may allow for a reduction in medication in the future. - Continue current antihypertensive medications - Monitor blood pressure regularly -Continue weight loss to help decrease the need for antihypertensives  Constipation She reports constipation, attributed to recent travel and weight loss efforts, with no bowel movement since Saturday. Constipation can be more  common during active weight loss. She has tried prunes and plans to take milk of magnesia. Advised to increase fiber intake and stay hydrated. - Increase fiber intake  - Stay hydrated - Use milk of magnesia as needed          She was informed of the importance of frequent follow up visits to maximize her success with intensive lifestyle modifications for her multiple health conditions.    Quillian Quince, MD

## 2023-09-14 ENCOUNTER — Other Ambulatory Visit: Payer: Self-pay | Admitting: Family Medicine

## 2023-09-14 DIAGNOSIS — F411 Generalized anxiety disorder: Secondary | ICD-10-CM

## 2023-09-16 ENCOUNTER — Encounter (HOSPITAL_COMMUNITY): Payer: Self-pay | Admitting: Internal Medicine

## 2023-09-16 ENCOUNTER — Ambulatory Visit (HOSPITAL_COMMUNITY): Attending: Internal Medicine

## 2023-09-24 ENCOUNTER — Ambulatory Visit (INDEPENDENT_AMBULATORY_CARE_PROVIDER_SITE_OTHER): Admitting: Psychology

## 2023-09-24 DIAGNOSIS — F331 Major depressive disorder, recurrent, moderate: Secondary | ICD-10-CM
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
## 2023-09-26 ENCOUNTER — Ambulatory Visit (INDEPENDENT_AMBULATORY_CARE_PROVIDER_SITE_OTHER): Admitting: Family Medicine

## 2023-09-26 ENCOUNTER — Encounter (INDEPENDENT_AMBULATORY_CARE_PROVIDER_SITE_OTHER): Payer: Self-pay | Admitting: Family Medicine

## 2023-09-26 VITALS — BP 129/68 | HR 101 | Temp 97.7°F | Ht 63.0 in | Wt 258.0 lb

## 2023-09-26 DIAGNOSIS — L732 Hidradenitis suppurativa: Secondary | ICD-10-CM | POA: Diagnosis not present

## 2023-09-26 DIAGNOSIS — E119 Type 2 diabetes mellitus without complications: Secondary | ICD-10-CM | POA: Diagnosis not present

## 2023-09-26 DIAGNOSIS — E1169 Type 2 diabetes mellitus with other specified complication: Secondary | ICD-10-CM

## 2023-09-26 DIAGNOSIS — Z6841 Body Mass Index (BMI) 40.0 and over, adult: Secondary | ICD-10-CM

## 2023-09-26 DIAGNOSIS — Z7985 Long-term (current) use of injectable non-insulin antidiabetic drugs: Secondary | ICD-10-CM

## 2023-09-26 DIAGNOSIS — K5900 Constipation, unspecified: Secondary | ICD-10-CM | POA: Diagnosis not present

## 2023-09-26 DIAGNOSIS — I10 Essential (primary) hypertension: Secondary | ICD-10-CM

## 2023-09-26 DIAGNOSIS — Z7984 Long term (current) use of oral hypoglycemic drugs: Secondary | ICD-10-CM

## 2023-09-26 DIAGNOSIS — E669 Obesity, unspecified: Secondary | ICD-10-CM

## 2023-09-26 NOTE — Progress Notes (Signed)
 Office: [phone]  /  Fax: [phone]  WEIGHT SUMMARY AND BIOMETRICS  Anthropometric Measurements Height: 5\' 3"  (1.6 m) Weight: 258 lb (117 kg) BMI (Calculated): 45.71 Weight at Last Visit: 255lb Weight Lost Since Last Visit: 0lb Weight Gained Since Last Visit: 3lb Starting Weight: 269lb Total Weight Loss (lbs): 11 lb (4.99 kg)   Body Composition  Body Fat %: 50.2 % Fat Mass (lbs): 129.6 lbs Muscle Mass (lbs): 122.2 lbs Total Body Water (lbs): 93.6 lbs Visceral Fat Rating : 17   Other Clinical Data Fasting: No Labs: No Today's Visit #: 5 Starting Date: 07/18/23    Chief Complaint: OBESITY   History of Present Illness Tammy Hogan is a 48 year old female with obesity and type 2 diabetes who presents for obesity treatment plan assessment and progress evaluation.  She is adhering to the prescribed category two obesity treatment plan approximately 60% of the time. She is not currently engaging in physical exercise and has experienced a weight gain of three pounds over the past three weeks. Her recent feeling of unwellness has hindered her ability to prepare meals, resulting in increased consumption of takeout food.  She has a chronic history of hidradenitis suppurativa, with a recent abscess on her breast that was swollen and red. She managed it with warm compresses and antibiotics after it drained. This condition has been a source of stress and discomfort since she was 48 years old.  She is monitoring her blood glucose levels using a continuous glucose monitor, reporting fluctuations but maintaining levels within range 86% of the time. She takes her diabetes medication in the morning with breakfast and is aware of the impact of certain foods and drinks on her blood sugar levels.  She has a history of hypertension since age 34, which has been challenging to manage. Her cardiologist has noted that her heart has been under significant strain, comparable to  that of a 48 year old. She is on medication for her blood pressure, which required time to optimize.  She struggles with meeting protein goals and maintaining a balanced diet, often lacking appetite for meat and not consuming enough vegetables. Her enjoyment of cooking, influenced by her mother's style, affects her dietary habits.      PHYSICAL EXAM:  Blood pressure 129/68, pulse (!) 101, temperature 97.7 F (36.5 C), height 5\' 3"  (1.6 m), weight 258 lb (117 kg), SpO2 98%. Body mass index is 45.7 kg/m.  DIAGNOSTIC DATA REVIEWED:  BMET    Component Value Date/Time   NA 139 07/18/2023 1237   K 4.3 07/18/2023 1237   CL 101 07/18/2023 1237   CO2 24 07/18/2023 1237   GLUCOSE 172 (H) 07/18/2023 1237   GLUCOSE 99 04/19/2023 1552   BUN 11 07/18/2023 1237   CREATININE 0.76 07/18/2023 1237   CREATININE 0.79 04/19/2023 1552   CALCIUM 9.0 07/18/2023 1237   GFRNONAA 89 08/05/2019 1705   GFRAA 102 08/05/2019 1705   Lab Results  Component Value Date   HGBA1C 6.5 (H) 07/18/2023   HGBA1C 5.4 07/03/2012   Lab Results  Component Value Date   INSULIN 55.6 (H) 07/18/2023   Lab Results  Component Value Date   TSH 1.740 07/18/2023   CBC    Component Value Date/Time   WBC 8.9 07/18/2023 1237   WBC 12.9 (H) 11/20/2022 0844   RBC 3.86 07/18/2023 1237   RBC 3.93 11/20/2022 0844   HGB 11.0 (L) 07/18/2023 1237   HCT 34.8 07/18/2023 1237   PLT 451 (H) 07/18/2023 1237  MCV 90 07/18/2023 1237   MCH 28.5 07/18/2023 1237   MCH 29.1 07/10/2013 1528   MCHC 31.6 07/18/2023 1237   MCHC 31.6 11/20/2022 0844   RDW 12.1 07/18/2023 1237   Iron Studies    Component Value Date/Time   IRON 51 07/05/2023 1427   TIBC 344 07/05/2023 1427   FERRITIN 104 07/05/2023 1427   IRONPCTSAT 15 07/05/2023 1427   Lipid Panel     Component Value Date/Time   CHOL 159 07/18/2023 1237   TRIG 113 07/18/2023 1237   HDL 36 (L) 07/18/2023 1237   CHOLHDL 3.9 09/26/2022 1133   CHOLHDL 5 05/10/2021 0813    VLDL 30.4 05/10/2021 0813   LDLCALC 102 (H) 07/18/2023 1237   Hepatic Function Panel     Component Value Date/Time   PROT 6.3 07/18/2023 1237   ALBUMIN 3.7 (L) 07/18/2023 1237   AST 15 07/18/2023 1237   ALT 18 07/18/2023 1237   ALKPHOS 124 (H) 07/18/2023 1237   BILITOT 0.3 07/18/2023 1237   BILIDIR 0.1 11/20/2022 0844      Component Value Date/Time   TSH 1.740 07/18/2023 1237   Nutritional Lab Results  Component Value Date   VD25OH 19.1 (L) 07/18/2023   VD25OH 35.51 05/10/2021   VD25OH 15.9 (L) 08/05/2019     Assessment and Plan Assessment & Plan Obesity She is on a category two obesity treatment plan but adheres to it only 60% of the time. She has gained three pounds in the last three weeks and is not currently exercising. She struggles with food cravings and deviations from her eating plan, particularly with high-calorie snacks and beverages. Strategies for managing diet while traveling were discussed, emphasizing healthier choices. She is considering Mounjaro for weight loss, which may also aid in blood sugar control and cardiovascular health. - Start Mounjaro for weight loss and blood sugar control - Continue current obesity treatment plan  Type 2 Diabetes Mellitus She is monitoring her blood glucose levels with a continuous glucose monitor and is taking metformin. Her blood glucose levels are mostly within range, with occasional spikes. The potential benefits of GLP-1 receptor agonists like Mounjaro for improved blood glucose control and weight loss were discussed. Mounjaro may help reduce carbohydrate cravings and hunger signals, aiding in better blood glucose management. - Continue metformin as prescribed - Start [facility] for improved blood glucose control and weight loss - Monitor blood glucose levels regularly  Hypertension She has hypertension with noted left ventricular hypertrophy and suboptimal heart function. She is interested in weight loss medications that  might also benefit her cardiovascular health. Mounjaro, which includes GLP-1 and GIP, may offer cardiovascular benefits by improving weight management and blood glucose control. - Continue current antihypertensive regimen - Consider the potential cardiovascular benefits of Mounjaro  Hidradenitis Suppurativa She has chronic hidradenitis suppurativa, currently experiencing an abscess in the breast area. The abscess has drained but remains slightly red and sore. She is on antibiotics post-drainage as per previous advice. Weight loss might improve hormonal balance and potentially reduce the frequency of flare-ups. - Continue antibiotics as prescribed  Constipation She experienced severe constipation while on Ozempic previously. Starting a stool softener was recommended to prevent constipation when starting Mounjaro. Colace will be prescribed to manage this side effect proactively. - Start Colace 100 mg daily to prevent constipation with Mounjaro    She was informed of the importance of frequent follow up visits to maximize her success with intensive lifestyle modifications for her multiple health conditions.  Jasmine Mesi, MD

## 2023-09-27 ENCOUNTER — Other Ambulatory Visit: Payer: Self-pay | Admitting: Interventional Cardiology

## 2023-09-30 ENCOUNTER — Other Ambulatory Visit: Payer: Self-pay | Admitting: Interventional Cardiology

## 2023-09-30 ENCOUNTER — Encounter: Payer: Self-pay | Admitting: Family Medicine

## 2023-09-30 ENCOUNTER — Encounter (INDEPENDENT_AMBULATORY_CARE_PROVIDER_SITE_OTHER): Payer: Self-pay | Admitting: Family Medicine

## 2023-09-30 MED ORDER — TIRZEPATIDE 2.5 MG/0.5ML ~~LOC~~ SOAJ: 2.5000 mg | SUBCUTANEOUS | 0 refills | Status: DC

## 2023-09-30 MED ORDER — DOCUSATE SODIUM 100 MG PO CAPS: 100.0000 mg | ORAL_CAPSULE | Freq: Every day | ORAL | 0 refills | Status: DC | PRN

## 2023-09-30 NOTE — Addendum Note (Signed)
 Addended by: Lanny Plan A on: 09/30/2023 12:51 PM   Modules accepted: Orders

## 2023-09-30 NOTE — Progress Notes (Signed)
 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  The arthritis is the major problem causing the pain.  I recommend that you return to clinic to go over the results in full detail and plan for next steps.  I am going to get some options ready on my hand including Zilretta  and gel injections.

## 2023-10-02 NOTE — Telephone Encounter (Signed)
 PA was placed and waiting for response Key: [id number]

## 2023-10-02 NOTE — Telephone Encounter (Signed)
 Good morning,  Monjaro needs a PA to be filled. They faxed it but we haven't seen it yet. They also provided a code  [employer] key code is [id number]  Thanks!

## 2023-10-02 NOTE — Telephone Encounter (Signed)
 Patient Tammy Hunt PA request has been approved. Additional information will be provided in the approval communication. Authorization Expiration Date: October 02, 2026.

## 2023-10-03 ENCOUNTER — Telehealth (INDEPENDENT_AMBULATORY_CARE_PROVIDER_SITE_OTHER): Payer: Self-pay | Admitting: *Deleted

## 2023-10-03 NOTE — Telephone Encounter (Signed)
 Patient has been updated that her medication(Mounjaro-2.5 MG/0.5Ml) has been approved- 10/02/2023-10/02/2026 , verbalized understanding , said that she has picked up one day ago.

## 2023-10-04 ENCOUNTER — Telehealth: Payer: Self-pay

## 2023-10-04 ENCOUNTER — Encounter: Payer: Self-pay | Admitting: Family Medicine

## 2023-10-04 ENCOUNTER — Ambulatory Visit: Payer: 59 | Admitting: Family Medicine

## 2023-10-04 VITALS — BP 126/74 | HR 84 | Temp 97.2°F | Ht 63.0 in | Wt 265.6 lb

## 2023-10-04 DIAGNOSIS — L732 Hidradenitis suppurativa: Secondary | ICD-10-CM | POA: Diagnosis not present

## 2023-10-04 DIAGNOSIS — E161 Other hypoglycemia: Secondary | ICD-10-CM

## 2023-10-04 DIAGNOSIS — I10 Essential (primary) hypertension: Secondary | ICD-10-CM | POA: Diagnosis not present

## 2023-10-04 DIAGNOSIS — R112 Nausea with vomiting, unspecified: Secondary | ICD-10-CM | POA: Diagnosis not present

## 2023-10-04 DIAGNOSIS — R739 Hyperglycemia, unspecified: Secondary | ICD-10-CM

## 2023-10-04 MED ORDER — ONDANSETRON HCL 4 MG PO TABS: 4.0000 mg | ORAL_TABLET | Freq: Three times a day (TID) | ORAL | 0 refills | Status: AC | PRN

## 2023-10-04 MED ORDER — DOXYCYCLINE HYCLATE 100 MG PO TABS: 100.0000 mg | ORAL_TABLET | Freq: Two times a day (BID) | ORAL | 0 refills | Status: DC

## 2023-10-04 NOTE — Assessment & Plan Note (Signed)
 Maximum weight: 271 lbs (06/2023) Current weight: 265 lbs Weight change since last visit: - 6 lbs Total weight loss: - 6 lbs (2.2 %)  [name] will continue to work with [facility]. Continue tirzepatide and metformin . We will monitor her weight loss.

## 2023-10-04 NOTE — Progress Notes (Signed)
 [facility] [address] [place] [address] Dept: [phone] Dept Fax: [phone]  Chronic Care Office Visit  Subjective:    Patient ID: Tammy Hogan, female    DOB: [birth year], 48 y.o..   MRN: [id number]  Chief Complaint  Patient presents with   Hypertension    3 month f/u.  C/o having LT ear clogged.  Questions about DM diagnosis and the hidradenitis   History of Present Illness:  Patient is in today for reassessment of chronic medical issues.  Tammy Hogan has a history of cardiomyopathy with systolic heart failure, hypertension and hyperlipidemia. She is managed on carvedilol  CR 80 mg daily, furosemide  20 mg daily, lisinopril  40 mg daily, minoxidil 2.5 mg daily, amlodipine  10 mg daily, a daily 81 mg aspirin  and spironolactone  25 mg daily. Tammy Hogan is also on atorvastatin  10 mg daily for lipid management. She denies any dyspnea or pedal edema.   Tammy Hogan has been working with [facility]. She had some testing earlier this winter showing an A1c of 6.5%, hyperglycemia, and an elevated insulin  level. She has been started on both metformin  %00 mg daily and tirzepatide (Mounjaro) 2.5 mg daily (initiated 2 days ago). She is using a CGM. She has noted some mild nausea associated with her first dose.  Tammy Hogan has a history of hidradenitis suppurative. She continues to have abscesses develop in her axilla and under her breasts. She is using antibacterial soaps. She has seen dermatology, who has not had a great deal to offer regarding her treatment.   Past Medical History: Patient Active Problem List   Diagnosis Date Noted   Vitamin D  deficiency 07/19/2023   Other fatigue 07/18/2023   SOBOE (shortness of breath on exertion) 07/18/2023   Depression screening 07/18/2023   Obesity, Beginning BMI 47.7 07/18/2023   Thyroid  enlargement 07/05/2023   Class 3 severe obesity due to excess calories with  serious comorbidity and body mass index (BMI) of 40.0 to 44.9 in adult (HCC) 04/24/2023   LPRD (laryngopharyngeal reflux disease) 12/20/2022   Bilateral sacroiliitis (HCC) 11/20/2022   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   Facet arthritis of lumbar region 02/14/2022   Hidradenitis suppurativa 04/21/2021   Anal fissure 04/21/2021   Keratosis pilaris 04/21/2021   Trochanteric bursitis of right hip 08/01/2020   Carpal tunnel syndrome of right wrist 06/26/2018   Left knee tendonitis 05/11/2018   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   Gastroesophageal reflux disease 06/20/2016   Mixed hyperlipidemia 05/30/2016   Cervicalgia 05/21/2016   Generalized anxiety disorder 05/15/2016   Arthralgia of multiple joints 05/15/2016   BMI 45.0-49.9, adult (HCC) 05/15/2016   Essential hypertension 09/07/2014   Cardiomyopathy (HCC) 07/02/2012   Systolic heart failure (HCC) 07/02/2012   Past Surgical History:  Procedure Laterality Date   CARDIAC CATHETERIZATION  feb, 2014   Dr Jacquelynn Matter   FOOT SURGERY  10/2019   ORIF ANKLE FRACTURE Left 07/10/2013   Procedure: OPEN REDUCTION INTERNAL FIXATION (ORIF) LEFT BIMALLEOLAR ANKLE FRACTURE;  Surgeon: Arnie Lao, MD;  Location: [facility];  Service: Orthopedics;  Laterality: Left;   Family History  Problem Relation Age of Onset   Alcohol abuse Mother    Anxiety disorder Mother    Depression Mother    Hypertension Mother    Hyperlipidemia Mother    Colon polyps Mother    Other Father        drug overdose   Drug abuse  Father        overdose   Hypertension Sister    Diabetes Maternal Uncle    Diabetes Maternal Grandmother    Cancer Maternal Grandmother    Heart failure Paternal Grandmother    Hypertension Paternal Grandmother    Diabetes Paternal Grandmother    Glaucoma Paternal Grandmother    Cirrhosis Paternal Grandfather    Alcohol abuse Paternal Grandfather    Cancer Other 4        breast, cervical and colon cancer   Colon cancer Neg Hx    Esophageal cancer Neg Hx    Rectal cancer Neg Hx    Stomach cancer Neg Hx    Outpatient Medications Prior to Visit  Medication Sig Dispense Refill   AMBULATORY NON FORMULARY MEDICATION Medication Name: Diltiazem ointment 2%- apply to anal sphincter 5 times a day as instructed per Dr. Elvin Hammer 30 g 3   amLODipine  (NORVASC ) 10 MG tablet TAKE ONE TABLET BY MOUTH DAILY 90 tablet 3   aspirin  81 MG tablet Take 81 mg by mouth daily.     atorvastatin  (LIPITOR) 10 MG tablet TAKE 1 TABLET BY MOUTH DAILY 90 tablet 0   carvedilol  (COREG  CR) 80 MG 24 hr capsule TAKE 1 CAPSULE BY MOUTH EVERY DAY 90 capsule 0   clobetasol  (TEMOVATE ) 0.05 % external solution Apply topically.     clotrimazole -betamethasone  (LOTRISONE ) cream Apply 1 Application topically daily. 30 g 0   docusate sodium  (COLACE) 100 MG capsule Take 1 capsule (100 mg total) by mouth daily as needed. 30 capsule 0   escitalopram  (LEXAPRO ) 10 MG tablet Take 1 tablet (10 mg total) by mouth daily. 90 tablet 3   furosemide  (LASIX ) 20 MG tablet TAKE 1 TABLET BY MOUTH DAILY 90 tablet 3   gabapentin  (NEURONTIN ) 300 MG capsule TAKE ONE CAPSULE BY MOUTH AT BEDTIME 90 capsule 3   hydrOXYzine (ATARAX) 25 MG tablet Take 25 mg by mouth 3 (three) times daily.     levonorgestrel (MIRENA) 20 MCG/24HR IUD 1 each by Intrauterine route once.     lisinopril  (ZESTRIL ) 40 MG tablet TAKE 1 TABLET BY MOUTH DAILY 90 tablet 0   metFORMIN  (GLUCOPHAGE ) 500 MG tablet Take 1 tablet (500 mg total) by mouth daily. 30 tablet 0   minoxidil (LONITEN) 2.5 MG tablet Take by mouth.     nitroGLYCERIN  (NITROSTAT ) 0.4 MG SL tablet Place 1 tablet (0.4 mg total) under the tongue every 5 (five) minutes as needed for chest pain. 25 tablet 3   nystatin  (MYCOSTATIN ) 100000 UNIT/ML suspension Take 5 mLs (500,000 Units total) by mouth 4 (four) times daily. 60 mL 0   nystatin  cream (MYCOSTATIN ) Apply 1 Application topically 2 (two) times  daily.     pantoprazole  (PROTONIX ) 40 MG tablet TAKE 1 TABLET BY MOUTH DAILY 90 tablet 3   spironolactone  (ALDACTONE ) 25 MG tablet TAKE 1 TABLET BY MOUTH DAILY 90 tablet 3   tirzepatide (MOUNJARO) 2.5 MG/0.5ML Pen Inject 2.5 mg into the skin once a week. 2 mL 0   Vitamin D , Ergocalciferol , (DRISDOL ) 1.25 MG (50000 UNIT) CAPS capsule Take 1 capsule (50,000 Units total) by mouth every 7 (seven) days. 12 capsule 0   ondansetron  (ZOFRAN ) 4 MG tablet TAKE 1 TABLET BY MOUTH EVERY 8 HOURS AS NEEDED FOR NAUSEA AND/OR VOMITING 18 tablet 0   No facility-administered medications prior to visit.   Allergies  Allergen Reactions   Percocet [Oxycodone -Acetaminophen ] Nausea Only   Hydrocodone -Acetaminophen  Nausea And Vomiting   Objective:   Today's  Vitals   10/04/23 1357  BP: 126/74  Pulse: 84  Temp: (!) 97.2 F (36.2 C)  TempSrc: Temporal  SpO2: 96%  Weight: 265 lb 9.6 oz (120.5 kg)  Height: 5\' 3"  (1.6 m)   Body mass index is 47.05 kg/m.   General: Well developed, well nourished. No acute distress. Skin: Warm and dry. There is an inflamed, subcutaneous nodule in the right axilla. No drainage or fluctuance at   present. Psych: Alert and oriented. Normal mood and affect.  Health Maintenance Due  Topic Date Due   Diabetic kidney evaluation - Urine ACR  Never done   Pneumococcal Vaccine 67-66 Years old (1 of 2 - PCV) 07/11/2013   Lab Results    Latest Ref Rng & Units 07/18/2023   12:37 PM 04/19/2023    3:52 PM 11/20/2022    8:44 AM  CMP  Glucose 70 - 99 mg/dL 960  99    BUN 6 - 24 mg/dL 11  15    Creatinine 4.54 - 1.00 mg/dL 0.98  1.19    Sodium 147 - 144 mmol/L 139  134    Potassium 3.5 - 5.2 mmol/L 4.3  4.5    Chloride 96 - 106 mmol/L 101  98    CO2 20 - 29 mmol/L 24  26    Calcium  8.7 - 10.2 mg/dL 9.0  9.6    Total Protein 6.0 - 8.5 g/dL 6.3   7.1   Total Bilirubin 0.0 - 1.2 mg/dL 0.3   0.4   Alkaline Phos 44 - 121 IU/L 124   100   AST 0 - 40 IU/L 15   11   ALT 0 - 32 IU/L 18    17    Last hemoglobin A1c Lab Results  Component Value Date   HGBA1C 6.5 (H) 07/18/2023   CGM  30-day avg.: 122  14-day avg.: 122  7-day avg: 135  3-day avg: 110    Assessment & Plan:   Problem List Items Addressed This Visit       Cardiovascular and Mediastinum   Essential hypertension - Primary   Blood pressure is in good control. Continue carvedilol  CR 80 mg daily, lisinopril  40 mg daily, minoxidil 2.5 mg daily, amlodipine  10 mg daily, and spironolactone  25 mg daily.         Musculoskeletal and Integument   Hidradenitis suppurativa   Tammy Hogan has long-standing issues with HS. She is currently having an exacerbation with skin abscesses. I will renew doxycycline . Continue hot packs to involved nodes.      Relevant Medications   doxycycline  (VIBRA -TABS) 100 MG tablet     Other   Obesity, Beginning BMI 47.7   Maximum weight: 271 lbs (06/2023) Current weight: 265 lbs Weight change since last visit: - 6 lbs Total weight loss: - 6 lbs (2.2 %)  Tammy Hogan will continue to work with [facility]. Continue tirzepatide and metformin . We will monitor her weight loss.      Other Visit Diagnoses       Nausea and vomiting, unspecified vomiting type       Common side effect of GLP1-RAs. I will renew Zofran .   Relevant Medications   ondansetron  (ZOFRAN ) 4 MG tablet     Hyperglycemia       Likely represents diabetes. I will reassess her A1c and glucose today. ADA criteria requires an A1c greater than or equal to 6.5% on two occasions.   Relevant Orders   Glucose, random   Hemoglobin A1c  Hyperinsulinemia       As above.   Relevant Orders   Glucose, random   Hemoglobin A1c       Return in about 3 months (around 01/03/2024) for Reassessment.   Graig Lawyer, MD

## 2023-10-04 NOTE — Telephone Encounter (Signed)
**  Patient scheduled 10/14/23**  Zilretta  authorized for left knee Patient is responsible for $80 copay Deductible do not apply OOP max$4890 has met $1617.92 Once OOP has been met copays will no longer apply   Reference # [id number] NO PA required EXP: 04/01/24

## 2023-10-04 NOTE — Assessment & Plan Note (Signed)
 [name] has long-standing issues with HS. She is currently having an exacerbation with skin abscesses. I will renew doxycycline . Continue hot packs to involved nodes.

## 2023-10-04 NOTE — Telephone Encounter (Signed)
 Noted.

## 2023-10-04 NOTE — Assessment & Plan Note (Signed)
Blood pressure is in good control. Continue carvedilol CR 80 mg daily, lisinopril 40 mg daily, minoxidil 2.5 mg daily, amlodipine 10 mg daily, and spironolactone 25 mg daily.

## 2023-10-05 LAB — GLUCOSE, RANDOM: Glucose, Plasma: 81 mg/dL (ref 65–139)

## 2023-10-05 LAB — HEMOGLOBIN A1C
Hgb A1c MFr Bld: 6.2 % — ABNORMAL HIGH (ref <5.7)
Mean Plasma Glucose: 131 mg/dL
eAG (mmol/L): 7.3 mmol/L

## 2023-10-07 ENCOUNTER — Other Ambulatory Visit: Payer: Self-pay | Admitting: Family Medicine

## 2023-10-07 DIAGNOSIS — E559 Vitamin D deficiency, unspecified: Secondary | ICD-10-CM

## 2023-10-08 ENCOUNTER — Other Ambulatory Visit: Payer: Self-pay | Admitting: Family Medicine

## 2023-10-08 DIAGNOSIS — E559 Vitamin D deficiency, unspecified: Secondary | ICD-10-CM

## 2023-10-09 ENCOUNTER — Other Ambulatory Visit: Payer: Self-pay

## 2023-10-09 MED ORDER — ATORVASTATIN CALCIUM 10 MG PO TABS: 10.0000 mg | ORAL_TABLET | Freq: Every day | ORAL | 3 refills | Status: AC

## 2023-10-10 ENCOUNTER — Encounter (INDEPENDENT_AMBULATORY_CARE_PROVIDER_SITE_OTHER): Payer: Self-pay | Admitting: Family Medicine

## 2023-10-10 ENCOUNTER — Ambulatory Visit: Admitting: Family Medicine

## 2023-10-10 ENCOUNTER — Ambulatory Visit (INDEPENDENT_AMBULATORY_CARE_PROVIDER_SITE_OTHER): Admitting: Family Medicine

## 2023-10-10 VITALS — BP 139/83 | HR 98 | Temp 98.1°F | Ht 63.0 in | Wt 260.0 lb

## 2023-10-10 DIAGNOSIS — M179 Osteoarthritis of knee, unspecified: Secondary | ICD-10-CM

## 2023-10-10 DIAGNOSIS — M7120 Synovial cyst of popliteal space [Baker], unspecified knee: Secondary | ICD-10-CM | POA: Diagnosis not present

## 2023-10-10 DIAGNOSIS — E669 Obesity, unspecified: Secondary | ICD-10-CM

## 2023-10-10 DIAGNOSIS — E1169 Type 2 diabetes mellitus with other specified complication: Secondary | ICD-10-CM

## 2023-10-10 DIAGNOSIS — E119 Type 2 diabetes mellitus without complications: Secondary | ICD-10-CM

## 2023-10-10 DIAGNOSIS — Z7985 Long-term (current) use of injectable non-insulin antidiabetic drugs: Secondary | ICD-10-CM

## 2023-10-10 DIAGNOSIS — F3289 Other specified depressive episodes: Secondary | ICD-10-CM | POA: Diagnosis not present

## 2023-10-10 DIAGNOSIS — M2550 Pain in unspecified joint: Secondary | ICD-10-CM

## 2023-10-10 DIAGNOSIS — R7303 Prediabetes: Secondary | ICD-10-CM | POA: Insufficient documentation

## 2023-10-10 DIAGNOSIS — F32A Depression, unspecified: Secondary | ICD-10-CM | POA: Insufficient documentation

## 2023-10-10 DIAGNOSIS — S83209A Unspecified tear of unspecified meniscus, current injury, unspecified knee, initial encounter: Secondary | ICD-10-CM

## 2023-10-10 DIAGNOSIS — Z6841 Body Mass Index (BMI) 40.0 and over, adult: Secondary | ICD-10-CM

## 2023-10-10 NOTE — Progress Notes (Signed)
 Office: [phone]  /  Fax: [phone]  WEIGHT SUMMARY AND BIOMETRICS  Anthropometric Measurements Height: 5\' 3"  (1.6 m) Weight: 260 lb (117.9 kg) BMI (Calculated): 46.07 Weight at Last Visit: 258lb Weight Lost Since Last Visit: 0 Weight Gained Since Last Visit: 2lb Starting Weight: 269lb Total Weight Loss (lbs): 9 lb (4.082 kg)   Body Composition  Body Fat %: 46.5 % Fat Mass (lbs): 121.2 lbs Muscle Mass (lbs): 132.6 lbs Total Body Water (lbs): 100.6 lbs Visceral Fat Rating : 15   Other Clinical Data Fasting: no Labs: no Today's Visit #: 6 Starting Date: 07/18/23    Chief Complaint: OBESITY   History of Present Illness Tammy Hogan is a 48 year old female with obesity who presents for obesity treatment and progress assessment.  She has been following her prescribed category two diet plan about fifty percent of the time and has not been exercising, resulting in a two-pound weight gain over the last two weeks. She experiences significant food cravings, particularly for high-calorie foods such as candy, fried shrimp, fried fish, wings, and pork chops. She describes an incident where she consumed over three thousand calories in one day, feeling unable to stop eating. Her husband bought her a [place]  cheesecake, which she felt compelled to eat to avoid waste, despite not being 'off her diet.'  She has restarted her meal plan, focusing on measuring and weighing her food to regain control. She finds it challenging to restart after a period of indulgence but is making efforts to adhere strictly to her plan. She discusses the role of her husband in her dietary efforts, noting that he was initially following the diet with her but did not experience weight loss. She needs his support and the importance of clear communication about how he can assist her, such as not bringing certain foods into the house.  She is using Mounjaro for her obesity management and has taken  her second dose recently. She is concerned that she may not have received the full dose due to some fluid and blood at the injection site. She has ondansetron  available for nausea but prefers to avoid using it unless necessary.  She wants to start exercising, specifically walking, but is cautious due to a small tear in her meniscus, arthritis, and a Baker's cyst. She plans to gradually increase her walking duration, starting with short intervals.      PHYSICAL EXAM:  Blood pressure 139/83, pulse 98, temperature 98.1 F (36.7 C), height 5\' 3"  (1.6 m), weight 260 lb (117.9 kg), SpO2 97%. Body mass index is 46.06 kg/m.  DIAGNOSTIC DATA REVIEWED:  BMET    Component Value Date/Time   NA 139 07/18/2023 1237   K 4.3 07/18/2023 1237   CL 101 07/18/2023 1237   CO2 24 07/18/2023 1237   GLUCOSE 172 (H) 07/18/2023 1237   GLUCOSE 99 04/19/2023 1552   BUN 11 07/18/2023 1237   CREATININE 0.76 07/18/2023 1237   CREATININE 0.79 04/19/2023 1552   CALCIUM  9.0 07/18/2023 1237   GFRNONAA 89 08/05/2019 1705   GFRAA 102 08/05/2019 1705   Lab Results  Component Value Date   HGBA1C 6.2 (H) 10/04/2023   HGBA1C 5.4 07/03/2012   Lab Results  Component Value Date   INSULIN  55.6 (H) 07/18/2023   Lab Results  Component Value Date   TSH 1.740 07/18/2023   CBC    Component Value Date/Time   WBC 8.9 07/18/2023 1237   WBC 12.9 (H) 11/20/2022 4010  RBC 3.86 07/18/2023 1237   RBC 3.93 11/20/2022 0844   HGB 11.0 (L) 07/18/2023 1237   HCT 34.8 07/18/2023 1237   PLT 451 (H) 07/18/2023 1237   MCV 90 07/18/2023 1237   MCH 28.5 07/18/2023 1237   MCH 29.1 07/10/2013 1528   MCHC 31.6 07/18/2023 1237   MCHC 31.6 11/20/2022 0844   RDW 12.1 07/18/2023 1237   Iron Studies    Component Value Date/Time   IRON 51 07/05/2023 1427   TIBC 344 07/05/2023 1427   FERRITIN 104 07/05/2023 1427   IRONPCTSAT 15 07/05/2023 1427   Lipid Panel     Component Value Date/Time   CHOL 159 07/18/2023 1237   TRIG  113 07/18/2023 1237   HDL 36 (L) 07/18/2023 1237   CHOLHDL 3.9 09/26/2022 1133   CHOLHDL 5 05/10/2021 0813   VLDL 30.4 05/10/2021 0813   LDLCALC 102 (H) 07/18/2023 1237   Hepatic Function Panel     Component Value Date/Time   PROT 6.3 07/18/2023 1237   ALBUMIN 3.7 (L) 07/18/2023 1237   AST 15 07/18/2023 1237   ALT 18 07/18/2023 1237   ALKPHOS 124 (H) 07/18/2023 1237   BILITOT 0.3 07/18/2023 1237   BILIDIR 0.1 11/20/2022 0844      Component Value Date/Time   TSH 1.740 07/18/2023 1237   Nutritional Lab Results  Component Value Date   VD25OH 19.1 (L) 07/18/2023   VD25OH 35.51 05/10/2021   VD25OH 15.9 (L) 08/05/2019     Assessment and Plan Assessment & Plan Obesity She is experiencing challenges with obesity management, including food cravings and emotional eating, leading to a weight gain of two pounds in the last two weeks. She has been following her prescribed category two plan about fifty percent of the time and is not currently exercising. The weight gain is attributed to fluid retention rather than fat gain. - Continue with the prescribed category two meal plan. - Discuss strategies with family for support in dietary management and emotional eating. - Encourage her to identify foods that trigger overeating and avoid bringing them into the house. - Consider using spray butter and bacon-flavored salt as healthier alternatives for flavoring vegetables. - Encourage her to start exercising with a goal of walking up to thirty minutes, allowing breaks as needed. - Encourage her to communicate with her husband about specific ways he can support her dietary goals.  Type 2 diabetes mellitus She is managing type 2 diabetes mellitus and is currently on Mounjaro. She has completed her second dose but is concerned about not receiving the full dose due to injection technique. Ondansetron  was prescribed for nausea, but its use is discouraged due to potential weight gain. She is advised  to wait for the next scheduled dose if she suspects incomplete administration. It was discussed that some patients do not need to escalate to the highest dose of Mounjaro due to nausea, and that the full benefit of a dose is typically felt after four doses. - Continue Mounjaro as prescribed, ensuring proper injection technique by pressing firmly and waiting for the second click and an additional five seconds. - Avoid using ondansetron  unless necessary due to its potential to cause weight gain. - Schedule follow-up appointments to monitor progress and adjust treatment as needed.  Knee osteoarthritis She has knee osteoarthritis, a small tear in the meniscus, and a Baker's cyst. She is planning to receive a gel injection for her knee. She is interested in starting a walking exercise regimen, with the goal of  reaching thirty minutes of walking, allowing breaks as needed. - Proceed with planned gel injection for knee osteoarthritis. - Encourage her to start a walking exercise regimen, allowing breaks as needed, with a goal of reaching thirty minutes.    She was informed of the importance of frequent follow up visits to maximize her success with intensive lifestyle modifications for her multiple health conditions.    Jasmine Mesi, MD

## 2023-10-14 ENCOUNTER — Ambulatory Visit (INDEPENDENT_AMBULATORY_CARE_PROVIDER_SITE_OTHER): Admitting: Family Medicine

## 2023-10-14 ENCOUNTER — Other Ambulatory Visit: Payer: Self-pay

## 2023-10-14 ENCOUNTER — Encounter: Payer: Self-pay | Admitting: Family Medicine

## 2023-10-14 VITALS — BP 124/84 | HR 88 | Ht 63.0 in | Wt 264.0 lb

## 2023-10-14 DIAGNOSIS — M25562 Pain in left knee: Secondary | ICD-10-CM | POA: Diagnosis not present

## 2023-10-14 DIAGNOSIS — M1712 Unilateral primary osteoarthritis, left knee: Secondary | ICD-10-CM | POA: Diagnosis not present

## 2023-10-14 DIAGNOSIS — G8929 Other chronic pain: Secondary | ICD-10-CM

## 2023-10-14 MED ORDER — TRIAMCINOLONE ACETONIDE 32 MG IX SRER
32.0000 mg | Freq: Once | INTRA_ARTICULAR | Status: AC
  Administered 2023-10-14: 32 mg via INTRA_ARTICULAR

## 2023-10-14 NOTE — Patient Instructions (Signed)
 Thank you for coming in today.   You received an injection today. Seek immediate medical attention if the joint becomes red, extremely painful, or is oozing fluid.   See you back as needed.

## 2023-10-14 NOTE — Progress Notes (Unsigned)
 I, Tammy Hogan, CMA acting as a scribe for Tammy Juniper, MD.  Tammy Hogan is a 48 y.o. female who presents to [facility] today for cont'd L knee pain w/ MRI review. Pt was last seen by Dr. [name] on 08/06/23 and was given a L knee steroid injection. Pt sent a MyChart message on 08/20/23 reporting no relief from last CSI and a MRI was ordered.  Today, pt reports no change in knee sx since last visit. Denies visible swelling. Review MRI today. Zilretta  injection today.   Dx imaging: 09/03/23 L knee MRI  08/06/23 L knee XR 06/16/18 L knee XR   Pertinent review of systems: No fevers or chills  Relevant historical information: Heart disease.  Diabetes.   Exam:  BP 124/84   Pulse 88   Ht 5\' 3"  (1.6 m)   Wt 264 lb (119.7 kg)   SpO2 98%   BMI 46.77 kg/m  General: Well Developed, well nourished, and in no acute distress.   MSK: Left knee mild effusion normal-appearing otherwise normal motion.    Lab and Radiology Results   Zilretta  injection left knee Procedure: Real-time Ultrasound Guided Injection of left knee joint superior lateral patellar space Device: Philips Affiniti 50G Images permanently stored and available for review in PACS Verbal informed consent obtained.  Discussed risks and benefits of procedure. Warned about infection, hyperglycemia bleeding, damage to structures among others. Patient expresses understanding and agreement Time-out conducted.   Noted no overlying erythema, induration, or other signs of local infection.   Skin prepped in a sterile fashion.   Local anesthesia: Topical Ethyl chloride.   With sterile technique and under real time ultrasound guidance: Zilretta  32 mg injected into knee joint. Fluid seen entering the joint capsule.   Completed without difficulty   Advised to call if fevers/chills, erythema, induration, drainage, or persistent bleeding.   Images permanently stored and available for review in the  ultrasound unit.  Impression: Technically successful ultrasound guided injection.  Lot number: [id number]   EXAM: MRI OF THE LEFT KNEE WITHOUT CONTRAST   TECHNIQUE: Multiplanar, multisequence MR imaging of the knee was performed. No intravenous contrast was administered.   COMPARISON:  None Available.   FINDINGS: MENISCI   Medial: Intact.   Lateral: Tiny radial tear of the free edge of the posterior horn lateral meniscus.   LIGAMENTS   Cruciates: ACL and PCL are intact.   Collaterals: Medial collateral ligament is intact. Lateral collateral ligament complex is intact.   CARTILAGE   Patellofemoral: Mild partial-thickness cartilage loss of the inferior aspect of the medial trochlea.   Medial: Moderate partial-thickness cartilage loss of the weight-bearing medial femorotibial compartment.   Lateral: Mild-moderate partial-thickness cartilage loss of the weight-bearing lateral femorotibial compartment.   JOINT: Moderate joint effusion. Normal Hoffa's fat-pad. No plical thickening.   POPLITEAL FOSSA: Popliteus tendon is intact. Small leaking Baker's cyst.   EXTENSOR MECHANISM: Intact quadriceps tendon. Intact patellar tendon. Intact lateral patellar retinaculum. Intact medial patellar retinaculum. Intact MPFL.   BONES: No aggressive osseous lesion. No fracture or dislocation. Mild subcortical marrow edema at the medial gastrocnemius tendon origin.   Other: No fluid collection or hematoma. Muscles are normal.   IMPRESSION: 1. Tiny radial tear of the free edge of the posterior horn lateral meniscus. 2. Tricompartmental cartilage abnormalities as described above. 3. Moderate joint effusion.     Electronically Signed   By: Onnie Bilis M.D.   On: 09/21/2023 09:25 I, Tammy Hogan, personally (  independently) visualized and performed the interpretation of the images attached in this note.    Assessment and Plan: 48 y.o. female with chronic left knee pain due to  DJD.  Plan for Zilretta  injection today.  Return as needed.   PDMP not reviewed this encounter. Orders Placed This Encounter  Procedures   US  LIMITED JOINT SPACE STRUCTURES LOW LEFT(NO LINKED CHARGES)    Reason for Exam (SYMPTOM  OR DIAGNOSIS REQUIRED):   left knee pain / OA    Preferred imaging location?:   [facility]   US  LIMITED JOINT SPACE STRUCTURES LOW LEFT(NO LINKED CHARGES)    Reason for Exam (SYMPTOM  OR DIAGNOSIS REQUIRED):   left knee inj    Preferred imaging location?:   [facility]   Meds ordered this encounter  Medications   Triamcinolone  Acetonide (ZILRETTA ) intra-articular injection 32 mg     Discussed warning signs or symptoms. Please see discharge instructions. Patient expresses understanding.   The above documentation has been reviewed and is accurate and complete Tammy Hogan, M.D.

## 2023-10-15 ENCOUNTER — Other Ambulatory Visit: Payer: Self-pay | Admitting: Internal Medicine

## 2023-10-15 ENCOUNTER — Encounter: Payer: Self-pay | Admitting: Internal Medicine

## 2023-10-15 DIAGNOSIS — I428 Other cardiomyopathies: Secondary | ICD-10-CM

## 2023-10-15 DIAGNOSIS — I10 Essential (primary) hypertension: Secondary | ICD-10-CM

## 2023-10-15 DIAGNOSIS — I119 Hypertensive heart disease without heart failure: Secondary | ICD-10-CM

## 2023-10-15 DIAGNOSIS — R7303 Prediabetes: Secondary | ICD-10-CM

## 2023-10-15 NOTE — Telephone Encounter (Signed)
 Set pt up for BMET and BNP   Home scales are the most consistent   Office scales can be off   Urinate in am the weigh self  Keep doing this daily

## 2023-10-18 ENCOUNTER — Other Ambulatory Visit: Payer: Self-pay

## 2023-10-18 DIAGNOSIS — I10 Essential (primary) hypertension: Secondary | ICD-10-CM

## 2023-10-18 MED ORDER — LISINOPRIL 40 MG PO TABS: 40.0000 mg | ORAL_TABLET | Freq: Every day | ORAL | 3 refills | Status: AC

## 2023-10-19 ENCOUNTER — Other Ambulatory Visit: Payer: Self-pay | Admitting: Family Medicine

## 2023-10-19 DIAGNOSIS — F411 Generalized anxiety disorder: Secondary | ICD-10-CM

## 2023-10-21 ENCOUNTER — Encounter: Payer: Self-pay | Admitting: Family Medicine

## 2023-10-22 ENCOUNTER — Ambulatory Visit (HOSPITAL_COMMUNITY): Attending: Internal Medicine

## 2023-10-22 DIAGNOSIS — I428 Other cardiomyopathies: Secondary | ICD-10-CM | POA: Diagnosis present

## 2023-10-22 LAB — ECHOCARDIOGRAM COMPLETE
Area-P 1/2: 4.06 cm2
S' Lateral: 3.1 cm

## 2023-10-22 MED ORDER — PERFLUTREN LIPID MICROSPHERE
1.0000 mL | INTRAVENOUS | Status: AC | PRN
  Administered 2023-10-22: 1 mL via INTRAVENOUS

## 2023-10-23 ENCOUNTER — Ambulatory Visit: Payer: Self-pay | Admitting: *Deleted

## 2023-10-23 LAB — BASIC METABOLIC PANEL WITH GFR
BUN/Creatinine Ratio: 19 (ref 9–23)
BUN: 16 mg/dL (ref 6–24)
CO2: 22 mmol/L (ref 20–29)
Calcium: 9.5 mg/dL (ref 8.7–10.2)
Chloride: 100 mmol/L (ref 96–106)
Creatinine, Ser: 0.85 mg/dL (ref 0.57–1.00)
Glucose: 67 mg/dL — ABNORMAL LOW (ref 70–99)
Potassium: 4.8 mmol/L (ref 3.5–5.2)
Sodium: 139 mmol/L (ref 134–144)
eGFR: 84 mL/min/{1.73_m2} (ref >59)

## 2023-10-23 LAB — PRO B NATRIURETIC PEPTIDE: NT-Pro BNP: <36 pg/mL (ref 0–249)

## 2023-10-24 ENCOUNTER — Ambulatory Visit: Payer: Self-pay | Admitting: Internal Medicine

## 2023-10-27 ENCOUNTER — Other Ambulatory Visit (INDEPENDENT_AMBULATORY_CARE_PROVIDER_SITE_OTHER): Payer: Self-pay | Admitting: Family Medicine

## 2023-10-27 DIAGNOSIS — E1169 Type 2 diabetes mellitus with other specified complication: Secondary | ICD-10-CM

## 2023-10-31 ENCOUNTER — Ambulatory Visit (INDEPENDENT_AMBULATORY_CARE_PROVIDER_SITE_OTHER): Admitting: Family Medicine

## 2023-10-31 ENCOUNTER — Encounter (INDEPENDENT_AMBULATORY_CARE_PROVIDER_SITE_OTHER): Payer: Self-pay | Admitting: Family Medicine

## 2023-10-31 VITALS — BP 135/83 | HR 83 | Temp 98.2°F | Ht 63.0 in | Wt 251.0 lb

## 2023-10-31 DIAGNOSIS — F5089 Other specified eating disorder: Secondary | ICD-10-CM | POA: Diagnosis not present

## 2023-10-31 DIAGNOSIS — Z6841 Body Mass Index (BMI) 40.0 and over, adult: Secondary | ICD-10-CM

## 2023-10-31 DIAGNOSIS — E669 Obesity, unspecified: Secondary | ICD-10-CM | POA: Diagnosis not present

## 2023-10-31 DIAGNOSIS — E119 Type 2 diabetes mellitus without complications: Secondary | ICD-10-CM | POA: Diagnosis not present

## 2023-10-31 DIAGNOSIS — Z7985 Long-term (current) use of injectable non-insulin antidiabetic drugs: Secondary | ICD-10-CM

## 2023-10-31 DIAGNOSIS — Z7984 Long term (current) use of oral hypoglycemic drugs: Secondary | ICD-10-CM

## 2023-10-31 DIAGNOSIS — F3289 Other specified depressive episodes: Secondary | ICD-10-CM

## 2023-10-31 DIAGNOSIS — E66813 Obesity, class 3: Secondary | ICD-10-CM

## 2023-10-31 DIAGNOSIS — E1169 Type 2 diabetes mellitus with other specified complication: Secondary | ICD-10-CM

## 2023-10-31 MED ORDER — TIRZEPATIDE 2.5 MG/0.5ML ~~LOC~~ SOAJ: 2.5000 mg | SUBCUTANEOUS | 0 refills | Status: DC

## 2023-10-31 MED ORDER — METFORMIN HCL 500 MG PO TABS: 500.0000 mg | ORAL_TABLET | Freq: Every day | ORAL | 0 refills | Status: DC

## 2023-10-31 NOTE — Progress Notes (Signed)
 Office: [phone]  /  Fax: [phone]  WEIGHT SUMMARY AND BIOMETRICS  Anthropometric Measurements Height: 5\' 3"  (1.6 m) Weight: 251 lb (113.9 kg) BMI (Calculated): 44.47 Weight at Last Visit: 260 lb Weight Lost Since Last Visit: 9 lb Weight Gained Since Last Visit: 0 Starting Weight: 269 lb Total Weight Loss (lbs): 18 lb (8.165 kg) Peak Weight: 275 lb   Body Composition  Body Fat %: 48.8 % Fat Mass (lbs): 122.6 lbs Muscle Mass (lbs): 122.4 lbs Total Body Water (lbs): 88.4 lbs Visceral Fat Rating : 16   Other Clinical Data Fasting: no Labs: no Today's Visit #: 7 Starting Date: 07/18/23    Chief Complaint: OBESITY   Discussed the use of AI scribe software for clinical note transcription with the patient, who gave verbal consent to proceed.  History of Present Illness Tammy Hogan is a 48 year old female with obesity and type 2 diabetes who presents for obesity treatment and progress assessment.  She is adhering to her category two eating plan approximately seventy percent of the time and engages in a variety of exercises including treadmill, bike, weights, and rowing machine for forty to fifty minutes, four times a week. She has lost nine pounds over the last three weeks.  She faces challenges with controlling cravings but manages by not keeping tempting foods like candy and chips at home. She occasionally indulges in small amounts, such as eating two fries when craving them, but avoids consuming large quantities. She is drinking more water, approximately thirty-two ounces daily, and has reduced her juice intake, often leaving a cup nearly full by morning. She continues to drink coffee in the morning but rarely finishes a full cup.  She finds it difficult to meet her protein intake goals and supplements with protein shakes or bars when necessary. She describes a specific protein bar with an unpleasant taste, which she consumes only when needed for protein  intake. Her exercise routine includes walking, rowing, and water aerobics, and she is gradually building her stamina. She notes improvement in her ability to exercise without feeling fatigued afterward.  She experienced one episode of low blood sugar, which she attributed to hunger rather than a need for sugar. She managed this by eating a piece of candy and subsequently eating a meal. She is currently taking metformin  and Mounjaro for her blood sugar management.      PHYSICAL EXAM:  Blood pressure 135/83, pulse 83, temperature 98.2 F (36.8 C), height 5\' 3"  (1.6 m), weight 251 lb (113.9 kg), SpO2 98%. Body mass index is 44.46 kg/m.  DIAGNOSTIC DATA REVIEWED:  BMET    Component Value Date/Time   NA 139 10/22/2023 1255   K 4.8 10/22/2023 1255   CL 100 10/22/2023 1255   CO2 22 10/22/2023 1255   GLUCOSE 67 (L) 10/22/2023 1255   GLUCOSE 99 04/19/2023 1552   BUN 16 10/22/2023 1255   CREATININE 0.85 10/22/2023 1255   CREATININE 0.79 04/19/2023 1552   CALCIUM  9.5 10/22/2023 1255   GFRNONAA 89 08/05/2019 1705   GFRAA 102 08/05/2019 1705   Lab Results  Component Value Date   HGBA1C 6.2 (H) 10/04/2023   HGBA1C 5.4 07/03/2012   Lab Results  Component Value Date   INSULIN  55.6 (H) 07/18/2023   Lab Results  Component Value Date   TSH 1.740 07/18/2023   CBC    Component Value Date/Time   WBC 8.9 07/18/2023 1237   WBC 12.9 (H) 11/20/2022 0844   RBC 3.86 07/18/2023  1237   RBC 3.93 11/20/2022 0844   HGB 11.0 (L) 07/18/2023 1237   HCT 34.8 07/18/2023 1237   PLT 451 (H) 07/18/2023 1237   MCV 90 07/18/2023 1237   MCH 28.5 07/18/2023 1237   MCH 29.1 07/10/2013 1528   MCHC 31.6 07/18/2023 1237   MCHC 31.6 11/20/2022 0844   RDW 12.1 07/18/2023 1237   Iron Studies    Component Value Date/Time   IRON 51 07/05/2023 1427   TIBC 344 07/05/2023 1427   FERRITIN 104 07/05/2023 1427   IRONPCTSAT 15 07/05/2023 1427   Lipid Panel     Component Value Date/Time   CHOL 159  07/18/2023 1237   TRIG 113 07/18/2023 1237   HDL 36 (L) 07/18/2023 1237   CHOLHDL 3.9 09/26/2022 1133   CHOLHDL 5 05/10/2021 0813   VLDL 30.4 05/10/2021 0813   LDLCALC 102 (H) 07/18/2023 1237   Hepatic Function Panel     Component Value Date/Time   PROT 6.3 07/18/2023 1237   ALBUMIN 3.7 (L) 07/18/2023 1237   AST 15 07/18/2023 1237   ALT 18 07/18/2023 1237   ALKPHOS 124 (H) 07/18/2023 1237   BILITOT 0.3 07/18/2023 1237   BILIDIR 0.1 11/20/2022 0844      Component Value Date/Time   TSH 1.740 07/18/2023 1237   Nutritional Lab Results  Component Value Date   VD25OH 19.1 (L) 07/18/2023   VD25OH 35.51 05/10/2021   VD25OH 15.9 (L) 08/05/2019     Assessment and Plan Assessment & Plan Obesity and Emotional Eating Behaviors Obesity management focuses on dietary modifications and exercise. She adheres to her eating plan 70% of the time and exercises regularly, including treadmill, bike, weights, and rowing machine for 40-50 minutes, four times a week. She has lost nine pounds in the last three weeks, increased water intake, and reduced juice consumption. Challenges include managing cravings, addressed with strategies and emotional eating behaviors managed with Lexapro  and Buspar . Mounjaro is continued at 2.5 mg due to effective weight loss, as increasing the dose may cause nausea and decreased metabolism. - Continue current dietary plan and exercise regimen. - Refill Mounjaro at 2.5 mg. - Continue Lexapro  and Buspar  for emotional eating behaviors. - Encourage strategies to manage cravings and emotional eating.  Type 2 diabetes mellitus Type 2 diabetes is managed with Metformin  and Mounjaro. She experienced one episode of hypoglycemia, managed by eating. Advised to avoid prolonged fasting to prevent hypoglycemia, as weight loss has reduced glucose reserves. - Continue Metformin  and Mounjaro. - Advise on maintaining regular meal intervals to prevent hypoglycemia.    She was informed  of the importance of frequent follow up visits to maximize her success with intensive lifestyle modifications for her multiple health conditions.    Jasmine Mesi, MD

## 2023-11-01 ENCOUNTER — Ambulatory Visit (INDEPENDENT_AMBULATORY_CARE_PROVIDER_SITE_OTHER): Admitting: Psychology

## 2023-11-01 DIAGNOSIS — F331 Major depressive disorder, recurrent, moderate: Secondary | ICD-10-CM

## 2023-11-01 NOTE — Progress Notes (Signed)
 [facility] Counselor/Therapist Progress Note  Patient ID: Tammy Hogan, MRN: [id number],    Date: 11/01/2023  Time Spent: 2:00pm-2:45pm   45 minutes   Treatment Type: Individual Therapy  Reported Symptoms: stress  Mental Status Exam: Appearance:  Casual     Behavior: Appropriate  Motor: Normal  Speech/Language:  Normal Rate  Affect: Appropriate  Mood: normal  Thought process: normal  Thought content:   WNL  Sensory/Perceptual disturbances:   WNL  Orientation: oriented to person, place, time/date, and situation  Attention: Good  Concentration: Good  Memory: WNL  Fund of knowledge:  Good  Insight:   Good  Judgment:  Good  Impulse Control: Good   Risk Assessment: Danger to Self:  No Self-injurious Behavior: No Danger to Others: No Duty to Warn:no Physical Aggression / Violence:No  Access to Firearms a concern: No  Gang Involvement:No   Subjective: Pt present for face-to-face individual therapy via video.  Pt consents to telehealth video session and is aware of limitations and benefits of virtual sessions. Location of pt: home Location of therapist: home office.   Pt talked about work.  The job is going pt but pt is disappointed that she has not been able to work from home two days a week yet.   Addressed how pt can communicate her thoughts with her supervisor.   Pt talked about her health.   She continues to go to [employer] [facility] [facility] and has lost 18 lbs.  She is on a low carb diet.   Pt has also joined [employer] and has been exercising 4 times a week.  Pt is proud of herself for the progress she is making. Pt talked about her relationship with her husband.   They have had a couple of arguments.   Addressed the issues.  Pt wants to renovate the house and her husband Loetta Ringer is worried about the money.   They have been negotiating about the issue and pt thinks Loetta Ringer does not trust her when she says she will pay some of the bills.  Helped  pt process her feelings and relationship dynamics.    Pt states her mood has been stable overall.   She gets frustrated at times with the family dynamics between her mother and sister but pt tries to stay out of the middle of their relationship.   Worked on self care strategies.   Provided supportive therapy.    Interventions: Cognitive Behavioral Therapy and Insight-Oriented  Diagnosis: F33.1  Plan of Care: Recommend ongoing therapy.   Pt participated in setting treatment goals.    Pt states she wants to "find some peace".   Pt wants to improve coping skills.   Plan to meet every two weeks.   Pt agrees with treatment plan.   Treatment Plan Client Abilities/Strengths  Pt is bright, engaging, and motivated for therapy.   Client Treatment Preferences  Individual therapy.  Client Statement of Needs  Improve coping skills.  Symptoms  Depressed or irritable mood. Sleeplessness or hypersomnia. Poor concentration and indecisiveness. Low self-esteem.  Problems Addressed  Unipolar Depression Goals 1. Alleviate depressive symptoms and return to previous level of effective functioning. 2. Appropriately grieve the loss in order to normalize mood and to return to previously adaptive level of functioning. Objective Learn and implement behavioral strategies to overcome depression. Target Date: 2024-04-08 Frequency: Biweekly  Progress: 10 Modality: individual  Related Interventions Engage the client in "behavioral activation," increasing his/her activity level and contact with sources of  reward, while identifying processes that inhibit activation.  Use behavioral techniques such as instruction, rehearsal, role-playing, role reversal, as needed, to facilitate activity in the client's daily life; reinforce success. Assist the client in developing skills that increase the likelihood of deriving pleasure from behavioral activation (e.g., assertiveness skills, developing an exercise plan, less  internal/more external focus, increased social involvement); reinforce success. Objective Identify important people in life, past and present, and describe the quality, good and poor, of those relationships. Target Date: 2024-04-08 Frequency: Biweekly  Progress: 10 Modality: individual  Related Interventions Conduct Interpersonal Therapy beginning with the assessment of the client's "interpersonal inventory" of important past and present relationships; develop a case formulation linking depression to grief, interpersonal role disputes, role transitions, and/or interpersonal deficits). Objective Learn and implement problem-solving and decision-making skills. Target Date: 2024-04-08 Frequency: Biweekly  Progress: 10 Modality: individual  Related Interventions Conduct Problem-Solving Therapy using techniques such as psychoeducation, modeling, and role-playing to teach client problem-solving skills (i.e., defining a problem specifically, generating possible solutions, evaluating the pros and cons of each solution, selecting and implementing a plan of action, evaluating the efficacy of the plan, accepting or revising the plan); role-play application of the problem-solving skill to a real life issue. Encourage in the client the development of a positive problem orientation in which problems and solving them are viewed as a natural part of life and not something to be feared, despaired, or avoided. 3. Develop healthy interpersonal relationships that lead to the alleviation and help prevent the relapse of depression. 4. Develop healthy thinking patterns and beliefs about self, others, and the world that lead to the alleviation and help prevent the relapse of depression. 5. Recognize, accept, and cope with feelings of depression. Diagnosis F33.1  Conditions For Discharge Achievement of treatment goals and objectives   Willey Harrier, LCSW

## 2023-11-14 ENCOUNTER — Ambulatory Visit (INDEPENDENT_AMBULATORY_CARE_PROVIDER_SITE_OTHER): Admitting: Family Medicine

## 2023-11-14 ENCOUNTER — Encounter (INDEPENDENT_AMBULATORY_CARE_PROVIDER_SITE_OTHER): Payer: Self-pay | Admitting: Family Medicine

## 2023-11-14 VITALS — BP 115/78 | HR 80 | Temp 98.0°F | Ht 63.0 in | Wt 256.0 lb

## 2023-11-14 DIAGNOSIS — K5900 Constipation, unspecified: Secondary | ICD-10-CM | POA: Diagnosis not present

## 2023-11-14 DIAGNOSIS — E119 Type 2 diabetes mellitus without complications: Secondary | ICD-10-CM | POA: Diagnosis not present

## 2023-11-14 DIAGNOSIS — Z6841 Body Mass Index (BMI) 40.0 and over, adult: Secondary | ICD-10-CM

## 2023-11-14 DIAGNOSIS — Z7985 Long-term (current) use of injectable non-insulin antidiabetic drugs: Secondary | ICD-10-CM

## 2023-11-14 DIAGNOSIS — E669 Obesity, unspecified: Secondary | ICD-10-CM | POA: Diagnosis not present

## 2023-11-14 DIAGNOSIS — E1169 Type 2 diabetes mellitus with other specified complication: Secondary | ICD-10-CM

## 2023-11-14 DIAGNOSIS — Z7984 Long term (current) use of oral hypoglycemic drugs: Secondary | ICD-10-CM

## 2023-11-14 MED ORDER — DOCUSATE SODIUM 100 MG PO CAPS
100.0000 mg | ORAL_CAPSULE | Freq: Every day | ORAL | 0 refills | Status: DC | PRN
Start: 2023-11-14 — End: 2024-01-08

## 2023-11-14 MED ORDER — TIRZEPATIDE 5 MG/0.5ML ~~LOC~~ SOAJ: 5.0000 mg | SUBCUTANEOUS | 0 refills | Status: DC

## 2023-11-14 MED ORDER — METFORMIN HCL 500 MG PO TABS: 500.0000 mg | ORAL_TABLET | Freq: Every day | ORAL | 0 refills | Status: DC

## 2023-11-14 NOTE — Progress Notes (Signed)
 Office: [phone]  /  Fax: [phone]  WEIGHT SUMMARY AND BIOMETRICS  Anthropometric Measurements Height: 5\' 3"  (1.6 m) Weight: 256 lb (116.1 kg) BMI (Calculated): 45.36 Weight at Last Visit: 251 lb Weight Lost Since Last Visit: 0 Weight Gained Since Last Visit: 5 lb Starting Weight: 269 lb Total Weight Loss (lbs): 13 lb (5.897 kg) Peak Weight: 275 lb   Body Composition  Body Fat %: 50.8 % Fat Mass (lbs): 130.4 lbs Muscle Mass (lbs): 119.8 lbs Total Body Water (lbs): 92.6 lbs Visceral Fat Rating : 17   Other Clinical Data Fasting: no Labs: no Today's Visit #: 8 Starting Date: 07/18/23    Chief Complaint: OBESITY   History of Present Illness Tammy Hogan is a 48 year old female with obesity and type two diabetes who presents for obesity treatment plan assessment and progress evaluation.  She has been following a category two eating plan approximately 70% of the time and exercises for 40 minutes, four to five times per week. Despite these efforts, she has gained five pounds in the last three weeks since her last visit.  She has a history of type two diabetes and is currently on metformin  and Mounjaro . She has completed approximately six doses of Mounjaro  at 2.5 mg without gastrointestinal issues. She experiences cravings, particularly after workouts, and struggles to maintain adequate protein intake, often consuming more carbohydrates and fats. She has started using protein shakes to help meet her protein needs.  She has a diagnosis of constipation and is taking Colace 100 mg, which is effective. She is also increasing her water intake, drinking at least 32 ounces a day, which she finds helpful.  She is preparing for a family visit over July 4th, where she will be cooking for a family gathering. No gastrointestinal issues with Mounjaro  and uncertain about fluid retention but reports no concerns from recent cardiology evaluation.      PHYSICAL  EXAM:  Blood pressure 115/78, pulse 80, temperature 98 F (36.7 C), height 5\' 3"  (1.6 m), weight 256 lb (116.1 kg), SpO2 99%. Body mass index is 45.35 kg/m.  DIAGNOSTIC DATA REVIEWED:  BMET    Component Value Date/Time   NA 139 10/22/2023 1255   K 4.8 10/22/2023 1255   CL 100 10/22/2023 1255   CO2 22 10/22/2023 1255   GLUCOSE 67 (L) 10/22/2023 1255   GLUCOSE 99 04/19/2023 1552   BUN 16 10/22/2023 1255   CREATININE 0.85 10/22/2023 1255   CREATININE 0.79 04/19/2023 1552   CALCIUM  9.5 10/22/2023 1255   GFRNONAA 89 08/05/2019 1705   GFRAA 102 08/05/2019 1705   Lab Results  Component Value Date   HGBA1C 6.2 (H) 10/04/2023   HGBA1C 5.4 07/03/2012   Lab Results  Component Value Date   INSULIN  55.6 (H) 07/18/2023   Lab Results  Component Value Date   TSH 1.740 07/18/2023   CBC    Component Value Date/Time   WBC 8.9 07/18/2023 1237   WBC 12.9 (H) 11/20/2022 0844   RBC 3.86 07/18/2023 1237   RBC 3.93 11/20/2022 0844   HGB 11.0 (L) 07/18/2023 1237   HCT 34.8 07/18/2023 1237   PLT 451 (H) 07/18/2023 1237   MCV 90 07/18/2023 1237   MCH 28.5 07/18/2023 1237   MCH 29.1 07/10/2013 1528   MCHC 31.6 07/18/2023 1237   MCHC 31.6 11/20/2022 0844   RDW 12.1 07/18/2023 1237   Iron Studies    Component Value Date/Time   IRON 51 07/05/2023 1427  TIBC 344 07/05/2023 1427   FERRITIN 104 07/05/2023 1427   IRONPCTSAT 15 07/05/2023 1427   Lipid Panel     Component Value Date/Time   CHOL 159 07/18/2023 1237   TRIG 113 07/18/2023 1237   HDL 36 (L) 07/18/2023 1237   CHOLHDL 3.9 09/26/2022 1133   CHOLHDL 5 05/10/2021 0813   VLDL 30.4 05/10/2021 0813   LDLCALC 102 (H) 07/18/2023 1237   Hepatic Function Panel     Component Value Date/Time   PROT 6.3 07/18/2023 1237   ALBUMIN 3.7 (L) 07/18/2023 1237   AST 15 07/18/2023 1237   ALT 18 07/18/2023 1237   ALKPHOS 124 (H) 07/18/2023 1237   BILITOT 0.3 07/18/2023 1237   BILIDIR 0.1 11/20/2022 0844      Component Value  Date/Time   TSH 1.740 07/18/2023 1237   Nutritional Lab Results  Component Value Date   VD25OH 19.1 (L) 07/18/2023   VD25OH 35.51 05/10/2021   VD25OH 15.9 (L) 08/05/2019     Assessment and Plan Assessment & Plan Obesity She follows the category two eating plan 70% of the time and exercises 40 minutes, 4-5 times per week. Despite these efforts, she has gained 5 pounds in the last three weeks, possibly due to fluid retention or adaptive response to weight loss efforts. She is not meeting protein intake goals, contributing to increased hunger and cravings, especially post-exercise. Emphasis is placed on obtaining protein from real food over supplements. Strategies to manage caloric intake during social events are discussed, including healthier food and drink options. - Increase Mounjaro  dose to 5 mg after completing current 2.5 mg doses - Encourage strict monitoring of protein and calorie intake - Advise on strategies to manage caloric intake during social events - Follow up in 3-4 weeks to assess response to increased Mounjaro  dose  Type 2 Diabetes Mellitus She manages her diabetes with metformin  and Mounjaro . Increasing the Mounjaro  dose may help with polyphagia related to blood sugar levels. She is advised to monitor dietary intake closely, especially protein and carbohydrates, to manage diabetes effectively. Increasing Mounjaro  is expected to improve hunger related to blood sugars, but not necessarily cravings. - Refill metformin  prescription - Increase Mounjaro  dose to 5 mg after completing current 2.5 mg doses  Constipation She experiences constipation and takes Colace 100 mg, which is effective. She is also increasing her water intake, which is beneficial. - Refill Colace prescription - Encourage continued increased water intake  General Health Maintenance Guidance on maintaining a balanced diet with adequate protein intake and monitoring caloric intake, especially during social  events, is provided. Healthier food and drink options for upcoming social gatherings are discussed. - Advise on healthier food and drink options for social events - Encourage continued exercise routine    She was informed of the importance of frequent follow up visits to maximize her success with intensive lifestyle modifications for her multiple health conditions.    Jasmine Mesi, MD

## 2023-11-15 ENCOUNTER — Other Ambulatory Visit: Payer: Self-pay | Admitting: Family Medicine

## 2023-11-15 DIAGNOSIS — I10 Essential (primary) hypertension: Secondary | ICD-10-CM

## 2023-11-28 ENCOUNTER — Ambulatory Visit (INDEPENDENT_AMBULATORY_CARE_PROVIDER_SITE_OTHER): Admitting: Psychology

## 2023-11-28 DIAGNOSIS — F331 Major depressive disorder, recurrent, moderate: Secondary | ICD-10-CM | POA: Diagnosis not present

## 2023-11-28 NOTE — Progress Notes (Signed)
  Behavioral Health Counselor/Therapist Progress Note  Patient ID: Tammy Hogan, MRN: [id number],    Date: 11/28/2023  Time Spent: 4:00pm-4:45pm   45 minutes   Treatment Type: Individual Therapy  Reported Symptoms: stress  Mental Status Exam: Appearance:  Casual     Behavior: Appropriate  Motor: Normal  Speech/Language:  Normal Rate  Affect: Appropriate  Mood: normal  Thought process: normal  Thought content:   WNL  Sensory/Perceptual disturbances:   WNL  Orientation: oriented to person, place, time/date, and situation  Attention: Good  Concentration: Good  Memory: WNL  Fund of knowledge:  Good  Insight:   Good  Judgment:  Good  Impulse Control: Good   Risk Assessment: Danger to Self:  No Self-injurious Behavior: No Danger to Others: No Duty to Warn:no Physical Aggression / Violence:No  Access to Firearms a concern: No  Gang Involvement:No   Subjective: Pt present for face-to-face individual therapy via video.  Pt consents to telehealth video session and is aware of limitations and benefits of virtual sessions. Location of pt: home Location of therapist: home office.   Pt talked about feeling a lot of stress bc of family issues.  Pt's family is coming for July 4th and pt is anxious about the preparations bc she wants everything to be perfect.   Pt is also frustrated with her sister, brother and mother.   They expect pt to take care of everything and often ask her for money.   Pt states she has trouble saying no and setting boundaries bc she does not want them upset with her.   Pt states she feels like she is trying to buy her family's love.   Helped pt process her feelings and family dynamics.  Worked on healthy boundary setting.  Pt talked about her health.   She continues to go to [employer] [facility] [facility].  She is on a low carb diet.   Pt also continues to go to [employer] and has been exercising 4 times a week.  Pt is proud of herself for the  progress she is making. Worked on self care strategies.   Provided supportive therapy.    Interventions: Cognitive Behavioral Therapy and Insight-Oriented  Diagnosis: F33.1  Plan of Care: Recommend ongoing therapy.   Pt participated in setting treatment goals.    Pt states she wants to find some peace.   Pt wants to improve coping skills.   Plan to meet every two weeks.   Pt agrees with treatment plan.   Treatment Plan Client Abilities/Strengths  Pt is bright, engaging, and motivated for therapy.   Client Treatment Preferences  Individual therapy.  Client Statement of Needs  Improve coping skills.  Symptoms  Depressed or irritable mood. Sleeplessness or hypersomnia. Poor concentration and indecisiveness. Low self-esteem.  Problems Addressed  Unipolar Depression Goals 1. Alleviate depressive symptoms and return to previous level of effective functioning. 2. Appropriately grieve the loss in order to normalize mood and to return to previously adaptive level of functioning. Objective Learn and implement behavioral strategies to overcome depression. Target Date: 2024-04-08 Frequency: Biweekly  Progress: 10 Modality: individual  Related Interventions Engage the client in behavioral activation, increasing his/her activity level and contact with sources of reward, while identifying processes that inhibit activation.  Use behavioral techniques such as instruction, rehearsal, role-playing, role reversal, as needed, to facilitate activity in the client's daily life; reinforce success. Assist the client in developing skills that increase the likelihood of deriving pleasure from behavioral activation (  e.g., assertiveness skills, developing an exercise plan, less internal/more external focus, increased social involvement); reinforce success. Objective Identify important people in life, past and present, and describe the quality, good and poor, of those relationships. Target Date: 2024-04-08  Frequency: Biweekly  Progress: 10 Modality: individual  Related Interventions Conduct Interpersonal Therapy beginning with the assessment of the client's interpersonal inventory of important past and present relationships; develop a case formulation linking depression to grief, interpersonal role disputes, role transitions, and/or interpersonal deficits). Objective Learn and implement problem-solving and decision-making skills. Target Date: 2024-04-08 Frequency: Biweekly  Progress: 10 Modality: individual  Related Interventions Conduct Problem-Solving Therapy using techniques such as psychoeducation, modeling, and role-playing to teach client problem-solving skills (i.e., defining a problem specifically, generating possible solutions, evaluating the pros and cons of each solution, selecting and implementing a plan of action, evaluating the efficacy of the plan, accepting or revising the plan); role-play application of the problem-solving skill to a real life issue. Encourage in the client the development of a positive problem orientation in which problems and solving them are viewed as a natural part of life and not something to be feared, despaired, or avoided. 3. Develop healthy interpersonal relationships that lead to the alleviation and help prevent the relapse of depression. 4. Develop healthy thinking patterns and beliefs about self, others, and the world that lead to the alleviation and help prevent the relapse of depression. 5. Recognize, accept, and cope with feelings of depression. Diagnosis F33.1  Conditions For Discharge Achievement of treatment goals and objectives   Tammy Harrier, LCSW

## 2023-12-03 ENCOUNTER — Encounter (INDEPENDENT_AMBULATORY_CARE_PROVIDER_SITE_OTHER): Payer: Self-pay | Admitting: Family Medicine

## 2023-12-03 ENCOUNTER — Ambulatory Visit (INDEPENDENT_AMBULATORY_CARE_PROVIDER_SITE_OTHER): Admitting: Family Medicine

## 2023-12-03 VITALS — BP 127/85 | HR 85 | Temp 97.9°F | Ht 63.0 in | Wt 257.0 lb

## 2023-12-03 DIAGNOSIS — Z7985 Long-term (current) use of injectable non-insulin antidiabetic drugs: Secondary | ICD-10-CM

## 2023-12-03 DIAGNOSIS — Z6841 Body Mass Index (BMI) 40.0 and over, adult: Secondary | ICD-10-CM | POA: Diagnosis not present

## 2023-12-03 DIAGNOSIS — E669 Obesity, unspecified: Secondary | ICD-10-CM | POA: Diagnosis not present

## 2023-12-03 DIAGNOSIS — E119 Type 2 diabetes mellitus without complications: Secondary | ICD-10-CM | POA: Diagnosis not present

## 2023-12-03 DIAGNOSIS — E1169 Type 2 diabetes mellitus with other specified complication: Secondary | ICD-10-CM

## 2023-12-03 MED ORDER — TIRZEPATIDE 5 MG/0.5ML ~~LOC~~ SOAJ: 5.0000 mg | SUBCUTANEOUS | 0 refills | Status: DC

## 2023-12-03 NOTE — Progress Notes (Signed)
 Office: [phone]  /  Fax: [phone]  WEIGHT SUMMARY AND BIOMETRICS  Anthropometric Measurements Height: 5' 3 (1.6 m) Weight: 257 lb (116.6 kg) BMI (Calculated): 45.54 Weight at Last Visit: 256 lb Weight Lost Since Last Visit: 0 Weight Gained Since Last Visit: 1 lb Starting Weight: 269 lb Total Weight Loss (lbs): 12 lb (5.443 kg) Peak Weight: 275 lb   Body Composition  Body Fat %: 51.9 % Fat Mass (lbs): 133.4 lbs Muscle Mass (lbs): 117.4 lbs Total Body Water (lbs): 95.4 lbs Visceral Fat Rating : 17   Other Clinical Data Fasting: No Labs: No Today's Visit #: 9 Starting Date: 07/18/23    Chief Complaint: OBESITY   Discussed the use of AI scribe software for clinical note transcription with the patient, who gave verbal consent to proceed.  History of Present Illness Tammy Hogan is a 48 year old female who presents for a follow-up on her obesity treatment plan.  She is adhering to her prescribed category two eating plan approximately seventy percent of the time and engages in a combination of cardio and strengthening exercises for about fifty minutes, three to four times a week. Despite these efforts, she has gained one pound in the last three weeks since her last visit. She notes an increase in water weight by about three pounds and expresses frustration over her weight gain despite adherence to her exercise and diet plan. She observes that her clothes feel looser, suggesting changes in body composition. She acknowledges occasional indulgences, such as a 'cheat weekend,' but generally follows her plan.  Her exercise routine includes treadmill workouts for twenty to thirty minutes, rowing, seated rows, squats, lat pulldowns, and weight exercises. She experiences muscle soreness, particularly in her legs, after workouts, which she attributes to overexertion during squats.  She is currently on Mounjaro , having recently started the 5 mg dose after insurance  issues delayed the increase from 2.5 mg. She is concerned about managing her appetite as the medication affects her hunger levels, recalling a similar experience with Ozempic where her appetite was significantly reduced.  No issues with constipation, as Colace and Fiberwell gummies are effective, resulting in regular bowel movements without difficulty.      PHYSICAL EXAM:  Blood pressure 127/85, pulse 85, temperature 97.9 F (36.6 C), height 5' 3 (1.6 m), weight 257 lb (116.6 kg), SpO2 99%. Body mass index is 45.53 kg/m.  DIAGNOSTIC DATA REVIEWED:  BMET    Component Value Date/Time   NA 139 10/22/2023 1255   K 4.8 10/22/2023 1255   CL 100 10/22/2023 1255   CO2 22 10/22/2023 1255   GLUCOSE 67 (L) 10/22/2023 1255   GLUCOSE 99 04/19/2023 1552   BUN 16 10/22/2023 1255   CREATININE 0.85 10/22/2023 1255   CREATININE 0.79 04/19/2023 1552   CALCIUM  9.5 10/22/2023 1255   GFRNONAA 89 08/05/2019 1705   GFRAA 102 08/05/2019 1705   Lab Results  Component Value Date   HGBA1C 6.2 (H) 10/04/2023   HGBA1C 5.4 07/03/2012   Lab Results  Component Value Date   INSULIN  55.6 (H) 07/18/2023   Lab Results  Component Value Date   TSH 1.740 07/18/2023   CBC    Component Value Date/Time   WBC 8.9 07/18/2023 1237   WBC 12.9 (H) 11/20/2022 0844   RBC 3.86 07/18/2023 1237   RBC 3.93 11/20/2022 0844   HGB 11.0 (L) 07/18/2023 1237   HCT 34.8 07/18/2023 1237   PLT 451 (H) 07/18/2023 1237   MCV 90  07/18/2023 1237   MCH 28.5 07/18/2023 1237   MCH 29.1 07/10/2013 1528   MCHC 31.6 07/18/2023 1237   MCHC 31.6 11/20/2022 0844   RDW 12.1 07/18/2023 1237   Iron Studies    Component Value Date/Time   IRON 51 07/05/2023 1427   TIBC 344 07/05/2023 1427   FERRITIN 104 07/05/2023 1427   IRONPCTSAT 15 07/05/2023 1427   Lipid Panel     Component Value Date/Time   CHOL 159 07/18/2023 1237   TRIG 113 07/18/2023 1237   HDL 36 (L) 07/18/2023 1237   CHOLHDL 3.9 09/26/2022 1133   CHOLHDL 5  05/10/2021 0813   VLDL 30.4 05/10/2021 0813   LDLCALC 102 (H) 07/18/2023 1237   Hepatic Function Panel     Component Value Date/Time   PROT 6.3 07/18/2023 1237   ALBUMIN 3.7 (L) 07/18/2023 1237   AST 15 07/18/2023 1237   ALT 18 07/18/2023 1237   ALKPHOS 124 (H) 07/18/2023 1237   BILITOT 0.3 07/18/2023 1237   BILIDIR 0.1 11/20/2022 0844      Component Value Date/Time   TSH 1.740 07/18/2023 1237   Nutritional Lab Results  Component Value Date   VD25OH 19.1 (L) 07/18/2023   VD25OH 35.51 05/10/2021   VD25OH 15.9 (L) 08/05/2019     Assessment and Plan Assessment & Plan Type 2 Diabetes Currently on Mounjaro  5 mg weekly after insurance delays. Emphasis on maintaining nutrient intake despite reduced appetite from Mounjaro , prioritizing protein to prevent deficiencies and metabolic slowdown. Recent obesity conference insights highlight risks of inadequate nutrition due to diminished hunger, potentially leading to metabolic issues. Plan to assess 5 mg dose effectiveness and consider adjustments based on response. - Continue Mounjaro  5 mg weekly. - Prioritize protein intake in meals. - Monitor response to 5 mg dose. - Send refill for Mounjaro  5 mg.  Obesity Following category two eating plan 70% of the time and exercising 50 minutes three to four times weekly. Reports one-pound weight gain attributed to water retention, with a three-pound increase in water weight. Reassured of fat loss despite fluid retention. Advised to increase hydration to manage fluid retention. Emphasis on assessing progress through clothing fit and overall feeling rather than scale weight. Considering personal training to diversify exercise and avoid imbalances. Discussed psychological impact of frequent weighing and advised against it to prevent negative thoughts. - Continue category two eating plan. - Increase hydration to manage fluid retention. - Consider personal training sessions. - Avoid frequent  weighing.  General Health Maintenance Emphasis on hydration, especially in hot weather, to prevent dehydration and fluid retention. - Increase water intake. - Stay cool and avoid excessive heat exposure.  Follow-up Appointments scheduled for end of July and August. Consider September appointment if needed. - Follow up at end of July and in August. - Consider September appointment if needed.     I have personally spent 33 minutes total time today in preparation, patient care, and documentation for this visit, including the following: review of clinical lab tests; review of medical history, review of meal planning and nutritional counseling   She was informed of the importance of frequent follow up visits to maximize her success with intensive lifestyle modifications for her multiple health conditions.    Louann Penton, MD

## 2023-12-05 ENCOUNTER — Encounter: Payer: Self-pay | Admitting: Family Medicine

## 2023-12-05 DIAGNOSIS — G8929 Other chronic pain: Secondary | ICD-10-CM

## 2023-12-05 DIAGNOSIS — M1712 Unilateral primary osteoarthritis, left knee: Secondary | ICD-10-CM

## 2023-12-06 MED ORDER — MELOXICAM 15 MG PO TABS: 15.0000 mg | ORAL_TABLET | Freq: Every day | ORAL | 3 refills | Status: AC

## 2023-12-09 ENCOUNTER — Telehealth: Payer: Self-pay

## 2023-12-09 NOTE — Telephone Encounter (Signed)
 Ran benefits for Monovisc/Orthovisc Case [id number]

## 2023-12-09 NOTE — Telephone Encounter (Signed)
-----   Message from Artist Lloyd sent at 12/06/2023 12:29 PM EDT ----- Regarding: Gel left knee Please auth single dose gel or 3 dose gel left knee

## 2023-12-10 NOTE — Telephone Encounter (Signed)
 [employer] requires pre certification, filled out form they faxed to us  and faxed back. Waiting [employer] response

## 2023-12-17 ENCOUNTER — Other Ambulatory Visit (INDEPENDENT_AMBULATORY_CARE_PROVIDER_SITE_OTHER): Payer: Self-pay | Admitting: Family Medicine

## 2023-12-17 DIAGNOSIS — E1169 Type 2 diabetes mellitus with other specified complication: Secondary | ICD-10-CM

## 2023-12-19 NOTE — Telephone Encounter (Signed)
 Can you sche patient when medication is stocked please  MONOVISC authorized left knee Deductible does not apply OOP MAX $4890 has met $2126.87 Once OOP has been met patient covered at 100% Copay $80 [place] APPROVED AUTH # [id number] 12/11/23-12/09/24

## 2023-12-20 ENCOUNTER — Other Ambulatory Visit (INDEPENDENT_AMBULATORY_CARE_PROVIDER_SITE_OTHER): Payer: Self-pay | Admitting: Family Medicine

## 2023-12-20 DIAGNOSIS — E1169 Type 2 diabetes mellitus with other specified complication: Secondary | ICD-10-CM

## 2023-12-20 NOTE — Telephone Encounter (Signed)
 Scheduled

## 2023-12-26 ENCOUNTER — Other Ambulatory Visit: Payer: Self-pay

## 2023-12-26 ENCOUNTER — Other Ambulatory Visit: Payer: Self-pay | Admitting: Interventional Cardiology

## 2023-12-26 ENCOUNTER — Ambulatory Visit: Admitting: Family Medicine

## 2023-12-26 ENCOUNTER — Other Ambulatory Visit: Payer: Self-pay | Admitting: Family Medicine

## 2023-12-26 VITALS — BP 126/86 | HR 85 | Ht 63.0 in | Wt 260.0 lb

## 2023-12-26 DIAGNOSIS — M25562 Pain in left knee: Secondary | ICD-10-CM | POA: Diagnosis not present

## 2023-12-26 DIAGNOSIS — Z6841 Body Mass Index (BMI) 40.0 and over, adult: Secondary | ICD-10-CM

## 2023-12-26 DIAGNOSIS — G8929 Other chronic pain: Secondary | ICD-10-CM | POA: Diagnosis not present

## 2023-12-26 DIAGNOSIS — M1712 Unilateral primary osteoarthritis, left knee: Secondary | ICD-10-CM

## 2023-12-26 DIAGNOSIS — I10 Essential (primary) hypertension: Secondary | ICD-10-CM

## 2023-12-26 DIAGNOSIS — G5603 Carpal tunnel syndrome, bilateral upper limbs: Secondary | ICD-10-CM

## 2023-12-26 MED ORDER — HYALURONAN 88 MG/4ML IX SOSY
88.0000 mg | PREFILLED_SYRINGE | Freq: Once | INTRA_ARTICULAR | Status: AC
  Administered 2023-12-26: 88 mg via INTRA_ARTICULAR

## 2023-12-26 NOTE — Telephone Encounter (Signed)
 Refill request for Gabapentin  300 mg LR  11/16/22, #90, 3 rf LOV  10/04/23 FOV  none scheduled.   Please review and advise.  Thanks Dm/cma

## 2023-12-26 NOTE — Patient Instructions (Addendum)
 Thank you for coming in today.   You received an injection today. Seek immediate medical attention if the joint becomes red, extremely painful, or is oozing fluid.   Check back as needed  Reminder: [name] will be out of the office starting August 1st, for about 6 weeks

## 2023-12-26 NOTE — Progress Notes (Signed)
 LILLETTE Tammy Collet, PhD, LAT, ATC acting as a scribe for Artist Lloyd, MD.  Tammy Hogan is a 48 y.o. female who presents to [facility] today for cont'd L knee pain. Pt was last seen by Dr. Lloyd on 10/14/23 and was given a L knee Zilretta  injection. Pt sent a MyChart message on 6/26 reporting cont'd knee pain and she was prescribed meloxicam  and we worked to authorize gel shots.  Today, pt reports only got short term relief from prior Zilretta  injection. She notes the feeling of swelling. She is wanting to try the Monovisc injection today.  She notes continued left knee pain. She is working with a Systems analyst to improve her core and quadricep strength.  This has helped her back pain quite a bit and is helping her knee pain a little.  She is also slowly losing weight and having some significant improvement in her body composition.  Dx imaging: 09/03/23 L knee MRI             08/06/23 L knee XR 06/16/18 L knee XR  Pertinent review of systems: No fevers or chills  Relevant historical information: Heart disease diabetes obesity Patient does take Mounjaro   Exam:  BP 126/86   Pulse 85   Ht 5' 3 (1.6 m)   Wt 260 lb (117.9 kg)   SpO2 99%   BMI 46.06 kg/m  General: Well Developed, well nourished, and in no acute distress.   MSK: Left knee moderate effusion.  Normal-appearing otherwise normal motion.    Lab and Radiology Results  Tammy Hogan presents to clinic today for Monovisc injection left knee 1/1 Procedure: Real-time Ultrasound Guided Injection of left knee joint superior lateral patellar space Device: Philips Affiniti 50G/GE Logiq Images permanently stored and available for review in PACS Verbal informed consent obtained.  Discussed risks and benefits of procedure. Warned about infection, bleeding, damage to structures among others. Patient expresses understanding and agreement Time-out conducted.   Noted no overlying  erythema, induration, or other signs of local infection.   Skin prepped in a sterile fashion.   Local anesthesia: Topical Ethyl chloride.   With sterile technique and under real time ultrasound guidance: Monovisc 4 mL injected into knee joint. Fluid seen entering the joint capsule.   Completed without difficulty   Advised to call if fevers/chills, erythema, induration, drainage, or persistent bleeding.   Images permanently stored and available for review in the ultrasound unit.  Impression: Technically successful ultrasound guided injection. Lot number: [id number]      Assessment and Plan: 48 y.o. female with left knee pain due to DJD.  Patient has already tried Zilretta  and conventional steroid injections which did not work well enough.  Will proceed to Monovisc injection today.  I think the increased quad strengthening and weight loss that she is working on with her personal trainer is an excellent idea and has the best chance of long-lasting benefit.  However if today's Monovisc injection does not work well enough her next step would be a knee replacement.  Her BMI of currently 46 is still too high for knee replacement.  It needs to be under 40.  Her next best option would be genicular artery embolization which we could order with a phone call or MyChart message if needed. Can repeat Monovisc in 6 months if needed   PDMP not reviewed this encounter. Orders Placed This Encounter  Procedures   US  LIMITED JOINT SPACE STRUCTURES LOW LEFT(NO LINKED CHARGES)  Reason for Exam (SYMPTOM  OR DIAGNOSIS REQUIRED):   left knee pain    Preferred imaging location?:   [facility] ordered this encounter  Medications   Hyaluronan (MONOVISC) intra-articular injection 88 mg     Discussed warning signs or symptoms. Please see discharge instructions. Patient expresses understanding.   The above documentation has been reviewed and is accurate and complete Artist Lloyd,  M.D.

## 2023-12-30 NOTE — Telephone Encounter (Signed)
 Forwarding to Dr. Denyse Amass to review and advise.

## 2023-12-31 ENCOUNTER — Other Ambulatory Visit (INDEPENDENT_AMBULATORY_CARE_PROVIDER_SITE_OTHER): Payer: Self-pay | Admitting: Family Medicine

## 2023-12-31 DIAGNOSIS — E1169 Type 2 diabetes mellitus with other specified complication: Secondary | ICD-10-CM

## 2024-01-01 ENCOUNTER — Other Ambulatory Visit (INDEPENDENT_AMBULATORY_CARE_PROVIDER_SITE_OTHER): Payer: Self-pay | Admitting: Family Medicine

## 2024-01-01 ENCOUNTER — Other Ambulatory Visit: Payer: Self-pay

## 2024-01-01 DIAGNOSIS — E1169 Type 2 diabetes mellitus with other specified complication: Secondary | ICD-10-CM

## 2024-01-01 DIAGNOSIS — E559 Vitamin D deficiency, unspecified: Secondary | ICD-10-CM

## 2024-01-01 MED ORDER — VITAMIN D (ERGOCALCIFEROL) 1.25 MG (50000 UNIT) PO CAPS: 50000.0000 [IU] | ORAL_CAPSULE | ORAL | 0 refills | Status: DC

## 2024-01-06 ENCOUNTER — Encounter (INDEPENDENT_AMBULATORY_CARE_PROVIDER_SITE_OTHER): Payer: Self-pay | Admitting: Family Medicine

## 2024-01-08 ENCOUNTER — Encounter (INDEPENDENT_AMBULATORY_CARE_PROVIDER_SITE_OTHER): Payer: Self-pay | Admitting: Family Medicine

## 2024-01-08 ENCOUNTER — Ambulatory Visit (INDEPENDENT_AMBULATORY_CARE_PROVIDER_SITE_OTHER): Admitting: Family Medicine

## 2024-01-08 VITALS — BP 146/81 | HR 93 | Temp 98.4°F | Ht 63.0 in | Wt 254.0 lb

## 2024-01-08 DIAGNOSIS — E119 Type 2 diabetes mellitus without complications: Secondary | ICD-10-CM

## 2024-01-08 DIAGNOSIS — E1159 Type 2 diabetes mellitus with other circulatory complications: Secondary | ICD-10-CM

## 2024-01-08 DIAGNOSIS — Z7985 Long-term (current) use of injectable non-insulin antidiabetic drugs: Secondary | ICD-10-CM

## 2024-01-08 DIAGNOSIS — E669 Obesity, unspecified: Secondary | ICD-10-CM

## 2024-01-08 DIAGNOSIS — E785 Hyperlipidemia, unspecified: Secondary | ICD-10-CM

## 2024-01-08 DIAGNOSIS — I10 Essential (primary) hypertension: Secondary | ICD-10-CM | POA: Diagnosis not present

## 2024-01-08 DIAGNOSIS — Z6841 Body Mass Index (BMI) 40.0 and over, adult: Secondary | ICD-10-CM

## 2024-01-08 DIAGNOSIS — Z7984 Long term (current) use of oral hypoglycemic drugs: Secondary | ICD-10-CM

## 2024-01-08 DIAGNOSIS — E66813 Obesity, class 3: Secondary | ICD-10-CM

## 2024-01-08 DIAGNOSIS — R0602 Shortness of breath: Secondary | ICD-10-CM

## 2024-01-08 DIAGNOSIS — K5900 Constipation, unspecified: Secondary | ICD-10-CM | POA: Diagnosis not present

## 2024-01-08 DIAGNOSIS — E559 Vitamin D deficiency, unspecified: Secondary | ICD-10-CM

## 2024-01-08 DIAGNOSIS — R0609 Other forms of dyspnea: Secondary | ICD-10-CM | POA: Diagnosis not present

## 2024-01-08 DIAGNOSIS — E1169 Type 2 diabetes mellitus with other specified complication: Secondary | ICD-10-CM

## 2024-01-08 MED ORDER — DOCUSATE SODIUM 100 MG PO CAPS: 100.0000 mg | ORAL_CAPSULE | Freq: Every day | ORAL | 0 refills | Status: DC | PRN

## 2024-01-08 MED ORDER — TIRZEPATIDE 5 MG/0.5ML ~~LOC~~ SOAJ
5.0000 mg | SUBCUTANEOUS | 0 refills | Status: DC
Start: 2024-01-08 — End: 2024-01-29

## 2024-01-08 MED ORDER — METFORMIN HCL 500 MG PO TABS: 500.0000 mg | ORAL_TABLET | Freq: Every day | ORAL | 0 refills | Status: DC

## 2024-01-08 NOTE — Progress Notes (Signed)
 Office: [phone]  /  Fax: [phone]  WEIGHT SUMMARY AND BIOMETRICS  Anthropometric Measurements Height: 5' 3 (1.6 m) Weight: 254 lb (115.2 kg) BMI (Calculated): 45.01 Weight at Last Visit: 257 lb Weight Lost Since Last Visit: 3 lb Weight Gained Since Last Visit: 0 Starting Weight: 269 lb Total Weight Loss (lbs): 15 lb (6.804 kg) Peak Weight: 275 lb   Body Composition  Body Fat %: 44.1 % Fat Mass (lbs): 112.2 lbs Muscle Mass (lbs): 135 lbs Total Body Water (lbs): 96.6 lbs Visceral Fat Rating : 14   Other Clinical Data RMR: 1570 Fasting: yes Labs: no Today's Visit #: 10 Starting Date: 07/18/23 Comments: IC done today    Chief Complaint: OBESITY    History of Present Illness Tammy Hogan is a 48 year old female who presents for a follow-up on her weight management plan.  She is adhering to a category two eating plan about fifty percent of the time and engages in physical activity, exercising sixty minutes three to four times a week with a combination of strengthening and cardio. She has lost three pounds in the last five weeks. She experiences hunger, particularly noting she is 'really hungry now' and brought a cheese stick to the appointment as she is fasting since 10 PM the previous night for potential lab work.  She experienced significant leg swelling from the knee down, lasting five days after prolonged standing during a family gathering. This swelling caused numbness in her feet and limited her ability to cook, impacting her activities during the holiday weekend.  She celebrated her birthday with portion control, having one slice of strawberry shortcake over two days and steamed seafood, which she found satisfying despite a desire for more cake.  She is working with a Systems analyst for [employer], focusing on knee-friendly exercises due to a 'messed up' knee. She enjoys the sessions despite initial difficulty and has noticed  improvement in her strength and endurance.  She reports that her metabolism test results have changed, with her resting energy expenditure now at 1500 calories, whereas it was previously around 1900 calories. She is currently consuming 60-70 grams of protein daily, primarily in the morning.  Her husband is also on a weight management plan and uses an app to track his intake, which she finds somewhat obsessive.  She experiences a little shortness of breath with exercise, which has improved with her focus on cardio and endurance training.      PHYSICAL EXAM:  Blood pressure (!) 146/81, pulse 93, temperature 98.4 F (36.9 C), height 5' 3 (1.6 m), weight 254 lb (115.2 kg), SpO2 99%. Body mass index is 44.99 kg/m.  DIAGNOSTIC DATA REVIEWED:  BMET    Component Value Date/Time   NA 139 10/22/2023 1255   K 4.8 10/22/2023 1255   CL 100 10/22/2023 1255   CO2 22 10/22/2023 1255   GLUCOSE 67 (L) 10/22/2023 1255   GLUCOSE 99 04/19/2023 1552   BUN 16 10/22/2023 1255   CREATININE 0.85 10/22/2023 1255   CREATININE 0.79 04/19/2023 1552   CALCIUM  9.5 10/22/2023 1255   GFRNONAA 89 08/05/2019 1705   GFRAA 102 08/05/2019 1705   Lab Results  Component Value Date   HGBA1C 6.2 (H) 10/04/2023   HGBA1C 5.4 07/03/2012   Lab Results  Component Value Date   INSULIN  55.6 (H) 07/18/2023   Lab Results  Component Value Date   TSH 1.740 07/18/2023   CBC    Component Value Date/Time   WBC 8.9  07/18/2023 1237   WBC 12.9 (H) 11/20/2022 0844   RBC 3.86 07/18/2023 1237   RBC 3.93 11/20/2022 0844   HGB 11.0 (L) 07/18/2023 1237   HCT 34.8 07/18/2023 1237   PLT 451 (H) 07/18/2023 1237   MCV 90 07/18/2023 1237   MCH 28.5 07/18/2023 1237   MCH 29.1 07/10/2013 1528   MCHC 31.6 07/18/2023 1237   MCHC 31.6 11/20/2022 0844   RDW 12.1 07/18/2023 1237   Iron Studies    Component Value Date/Time   IRON 51 07/05/2023 1427   TIBC 344 07/05/2023 1427   FERRITIN 104 07/05/2023 1427   IRONPCTSAT 15  07/05/2023 1427   Lipid Panel     Component Value Date/Time   CHOL 159 07/18/2023 1237   TRIG 113 07/18/2023 1237   HDL 36 (L) 07/18/2023 1237   CHOLHDL 3.9 09/26/2022 1133   CHOLHDL 5 05/10/2021 0813   VLDL 30.4 05/10/2021 0813   LDLCALC 102 (H) 07/18/2023 1237   Hepatic Function Panel     Component Value Date/Time   PROT 6.3 07/18/2023 1237   ALBUMIN 3.7 (L) 07/18/2023 1237   AST 15 07/18/2023 1237   ALT 18 07/18/2023 1237   ALKPHOS 124 (H) 07/18/2023 1237   BILITOT 0.3 07/18/2023 1237   BILIDIR 0.1 11/20/2022 0844      Component Value Date/Time   TSH 1.740 07/18/2023 1237   Nutritional Lab Results  Component Value Date   VD25OH 19.1 (L) 07/18/2023   VD25OH 35.51 05/10/2021   VD25OH 15.9 (L) 08/05/2019     Assessment and Plan Assessment & Plan Obesity Obesity management is ongoing with a focus on diet and exercise. She is following the category two eating plan about 50% of the time and exercises 60 minutes 3-4 times a week, combining strengthening and cardio. She has lost 3 pounds in the last five weeks. Her resting energy expenditure has dropped to 1570 calories, which is inappropriate given her current weight and muscle mass. This drop may be due to insufficient caloric intake and protein consumption. She is experiencing hunger but is restricting intake to lose weight, which may be counterproductive. Discussed the importance of maintaining adequate caloric and protein intake to prevent further metabolic slowdown and support weight loss efforts. - Maintain caloric intake between 1200-1400 calories per day, with 1400 calories on exercise days. - Ensure protein intake is 100 grams per day, spread throughout the day. - Avoid consuming more than 30 grams of protein at one time. - Limit protein supplements to one per day and prioritize protein from food sources. - Continue category two eating plan as a guide, adjusting as needed to meet caloric and protein goals. - Recheck  resting energy expenditure by the end of the year.  Type 2 diabetes mellitus - Order labs to check blood sugar levels. - Continue diet, exercise and weight loss as discussed today as an important part of her treatment plan  Essential hypertension Blood pressure was slightly elevated, likely due to not taking medication while fasting. Normally, blood pressure is well-controlled with medication. - Resume blood pressure medication as soon as possible. - Continue diet, exercise and weight loss as discussed today as an important part of her treatment plan  Hyperlipidemia Hyperlipidemia management includes monitoring cholesterol levels. - Order labs to check cholesterol levels. - Continue diet, exercise and weight loss as discussed today as an important part of her treatment plan  Dyspnea on exertion Dyspnea on exertion is improving with weight loss and exercise. She reports  only a little shortness of breath with exercise, indicating improvement in endurance. - Continue diet, exercise and weight loss as discussed today as an important part of her treatment plan  Constipation Stable on colace -RF colace, continue to eat high fiber foods and increase water intake       She was informed of the importance of frequent follow up visits to maximize her success with intensive lifestyle modifications for her multiple health conditions.    Louann Penton, MD

## 2024-01-13 ENCOUNTER — Ambulatory Visit (INDEPENDENT_AMBULATORY_CARE_PROVIDER_SITE_OTHER): Admitting: Psychology

## 2024-01-13 DIAGNOSIS — F331 Major depressive disorder, recurrent, moderate: Secondary | ICD-10-CM

## 2024-01-13 NOTE — Progress Notes (Signed)
 [facility] Counselor/Therapist Progress Note  Patient ID: Tammy Hogan, MRN: [id number],    Date: 01/13/2024  Time Spent: 3:00pm-3:45pm   45 minutes   Treatment Type: Individual Therapy  Reported Symptoms: stress  Mental Status Exam: Appearance:  Casual     Behavior: Appropriate  Motor: Normal  Speech/Language:  Normal Rate  Affect: Appropriate  Mood: normal  Thought process: normal  Thought content:   WNL  Sensory/Perceptual disturbances:   WNL  Orientation: oriented to person, place, time/date, and situation  Attention: Good  Concentration: Good  Memory: WNL  Fund of knowledge:  Good  Insight:   Good  Judgment:  Good  Impulse Control: Good   Risk Assessment: Danger to Self:  No Self-injurious Behavior: No Danger to Others: No Duty to Warn:no Physical Aggression / Violence:No  Access to Firearms a concern: No  Gang Involvement:No   Subjective: Pt present for face-to-face individual therapy via video.  Pt consents to telehealth video session and is aware of limitations and benefits of virtual sessions. Location of pt: home Location of therapist: home office.   Pt talked about feeling a lot of stress bc of family issues.  Pt's family came for a July family reunion and it did not go as well as pt hoped.  Pt ended up not feeling well and had edema in her legs so she could not cook.  Pt's brother cooked and burned most things.   Pt's mother fell but did not hurt herself.  Addressed pt's disappointment with the family reunion.  Pt talked about her health.  She has been in a lot of pain.   Pt's knee has been hurting her.   She plans to see her doctor about it but in the meantime has had to restrict a lot of activity.   Pt states she has been feeling down bc of the pain.   Helped pt process her feelings and worked on Pharmacologist.   Pt continues to go to [facility].  She is on a low carb diet.   Pt also continues to go to [employer] and has been exercising 4 times a week.  Pt is taking monjaro for weight loss but has not been losing weight so she is very frustrated.  Worked on self care strategies.   Provided supportive therapy.    Interventions: Cognitive Behavioral Therapy and Insight-Oriented  Diagnosis: F33.1  Plan of Care: Recommend ongoing therapy.   Pt participated in setting treatment goals.    Pt states she wants to find some peace.   Pt wants to improve coping skills.   Plan to meet every two weeks.   Pt agrees with treatment plan.   Treatment Plan Client Abilities/Strengths  Pt is bright, engaging, and motivated for therapy.   Client Treatment Preferences  Individual therapy.  Client Statement of Needs  Improve coping skills.  Symptoms  Depressed or irritable mood. Sleeplessness or hypersomnia. Poor concentration and indecisiveness. Low self-esteem.  Problems Addressed  Unipolar Depression Goals 1. Alleviate depressive symptoms and return to previous level of effective functioning. 2. Appropriately grieve the loss in order to normalize mood and to return to previously adaptive level of functioning. Objective Learn and implement behavioral strategies to overcome depression. Target Date: 2024-04-08 Frequency: Biweekly  Progress: 10 Modality: individual  Related Interventions Engage the client in behavioral activation, increasing his/her activity level and contact with sources of reward, while identifying processes that inhibit activation.  Use behavioral techniques such as  instruction, rehearsal, role-playing, role reversal, as needed, to facilitate activity in the client's daily life; reinforce success. Assist the client in developing skills that increase the likelihood of deriving pleasure from behavioral activation (e.g., assertiveness skills, developing an exercise plan, less internal/more external focus, increased social involvement); reinforce success. Objective Identify important  people in life, past and present, and describe the quality, good and poor, of those relationships. Target Date: 2024-04-08 Frequency: Biweekly  Progress: 10 Modality: individual  Related Interventions Conduct Interpersonal Therapy beginning with the assessment of the client's interpersonal inventory of important past and present relationships; develop a case formulation linking depression to grief, interpersonal role disputes, role transitions, and/or interpersonal deficits). Objective Learn and implement problem-solving and decision-making skills. Target Date: 2024-04-08 Frequency: Biweekly  Progress: 10 Modality: individual  Related Interventions Conduct Problem-Solving Therapy using techniques such as psychoeducation, modeling, and role-playing to teach client problem-solving skills (i.e., defining a problem specifically, generating possible solutions, evaluating the pros and cons of each solution, selecting and implementing a plan of action, evaluating the efficacy of the plan, accepting or revising the plan); role-play application of the problem-solving skill to a real life issue. Encourage in the client the development of a positive problem orientation in which problems and solving them are viewed as a natural part of life and not something to be feared, despaired, or avoided. 3. Develop healthy interpersonal relationships that lead to the alleviation and help prevent the relapse of depression. 4. Develop healthy thinking patterns and beliefs about self, others, and the world that lead to the alleviation and help prevent the relapse of depression. 5. Recognize, accept, and cope with feelings of depression. Diagnosis F33.1  Conditions For Discharge Achievement of treatment goals and objectives   Tammy Alma, LCSW                 Tammy Greeno Roslyn, LCSW

## 2024-01-14 ENCOUNTER — Ambulatory Visit (INDEPENDENT_AMBULATORY_CARE_PROVIDER_SITE_OTHER): Admitting: Family Medicine

## 2024-01-14 DIAGNOSIS — R7303 Prediabetes: Secondary | ICD-10-CM | POA: Diagnosis not present

## 2024-01-14 DIAGNOSIS — I10 Essential (primary) hypertension: Secondary | ICD-10-CM | POA: Diagnosis not present

## 2024-01-14 NOTE — Assessment & Plan Note (Signed)
Blood pressure is in good control. Continue carvedilol CR 80 mg daily, lisinopril 40 mg daily, minoxidil 2.5 mg daily, amlodipine 10 mg daily, and spironolactone 25 mg daily.

## 2024-01-14 NOTE — Assessment & Plan Note (Signed)
 By ADA criteria, Tammy Hogan has not met criteria for diabetes. However, I support her use of metformin  and tirzepatide  for management of her blood glucose and achieving weight loss to allow for knee surgery.

## 2024-01-14 NOTE — Progress Notes (Signed)
 [facility] [address] [place] [address] Dept: [phone] Dept Fax: [phone]  Chronic Care Office Visit  Subjective:    Patient ID: Tammy Hogan, female    DOB: [birth year], 48 y.o..   MRN: [id number]  Chief Complaint  Patient presents with   Hypertension    3 month f/u.  C/o pain in LT knee x months off/on.    History of Present Illness:  Patient is in today for reassessment of chronic medical issues.  Tammy Hogan has a history of cardiomyopathy with systolic heart failure, hypertension and hyperlipidemia. She is managed on carvedilol  CR 80 mg daily, furosemide  20 mg daily, lisinopril  40 mg daily, minoxidil 2.5 mg daily, amlodipine  10 mg daily, a daily 81 mg aspirin  and spironolactone  25 mg daily. Tammy Hogan is also on atorvastatin  10 mg daily for lipid management. She denies any dyspnea or pedal edema.    Tammy Hogan has been working with [facility]. She had some testing earlier this winter showing an A1c of 6.5%, hyperglycemia, and an elevated insulin  level. She has been managed on metformin  500 mg daily and tirzepatide  (Mounjaro ) 5 mg daily. Despite issues with knee osteoarthritis, she is working out with a Psychologist, educational with a mix of cardio and weight training.    Past Medical History: Patient Active Problem List   Diagnosis Date Noted   Chronic pain of left knee 12/26/2023   Primary osteoarthritis of left knee 12/26/2023   Diabetes mellitus (HCC) 12/03/2023   Prediabetes 10/10/2023   Depression 10/10/2023   Vitamin D  deficiency 07/19/2023   Other fatigue 07/18/2023   SOBOE (shortness of breath on exertion) 07/18/2023   Obesity, Beginning BMI 47.7 07/18/2023   Thyroid  enlargement 07/05/2023   LPRD (laryngopharyngeal reflux disease) 12/20/2022   Bilateral sacroiliitis (HCC) 11/20/2022   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   Facet arthritis of  lumbar region 02/14/2022   Hidradenitis suppurativa 04/21/2021   Anal fissure 04/21/2021   Keratosis pilaris 04/21/2021   Trochanteric bursitis of right hip 08/01/2020   Carpal tunnel syndrome of right wrist 06/26/2018   Left knee tendonitis 05/11/2018   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   Gastroesophageal reflux disease 06/20/2016   Mixed hyperlipidemia 05/30/2016   Cervicalgia 05/21/2016   Generalized anxiety disorder 05/15/2016   Arthralgia of multiple joints 05/15/2016   BMI 45.0-49.9, adult (HCC) 05/15/2016   Essential hypertension 09/07/2014   Cardiomyopathy (HCC) 07/02/2012   Systolic heart failure (HCC) 07/02/2012   Past Surgical History:  Procedure Laterality Date   CARDIAC CATHETERIZATION  feb, 2014   Dr Dann   FOOT SURGERY  10/2019   ORIF ANKLE FRACTURE Left 07/10/2013   Procedure: OPEN REDUCTION INTERNAL FIXATION (ORIF) LEFT BIMALLEOLAR ANKLE FRACTURE;  Surgeon: [name], MD;  Location: [facility];  Service: Orthopedics;  Laterality: Left;   Family History  Problem Relation Age of Onset   Alcohol abuse Mother    Anxiety disorder Mother    Depression Mother    Hypertension Mother    Hyperlipidemia Mother    Colon polyps Mother    Other Father        drug overdose   Drug abuse Father        overdose   Hypertension Sister    Diabetes Maternal Uncle    Diabetes Maternal Grandmother    Cancer Maternal Grandmother    Heart failure Paternal Grandmother    Hypertension Paternal Grandmother  Diabetes Paternal Grandmother    Glaucoma Paternal Grandmother    Cirrhosis Paternal Grandfather    Alcohol abuse Paternal Grandfather    Cancer Other 47       breast, cervical and colon cancer   Colon cancer Neg Hx    Esophageal cancer Neg Hx    Rectal cancer Neg Hx    Stomach cancer Neg Hx    Outpatient Medications Prior to Visit  Medication Sig Dispense Refill   amLODipine  (NORVASC ) 10 MG tablet TAKE 1 TABLET BY MOUTH DAILY 90 tablet 2    aspirin  81 MG tablet Take 81 mg by mouth daily.     atorvastatin  (LIPITOR) 10 MG tablet Take 1 tablet (10 mg total) by mouth daily. 90 tablet 3   busPIRone  (BUSPAR ) 5 MG tablet TAKE 1 TABLET BY MOUTH DAILY AS NEEDED 30 tablet 6   carvedilol  (COREG  CR) 80 MG 24 hr capsule TAKE 1 CAPSULE BY MOUTH EVERY DAY 90 capsule 2   clobetasol  (TEMOVATE ) 0.05 % external solution Apply topically.     clotrimazole -betamethasone  (LOTRISONE ) cream Apply 1 Application topically daily. 30 g 0   docusate sodium  (COLACE) 100 MG capsule Take 1 capsule (100 mg total) by mouth daily as needed. 30 capsule 0   doxycycline  (VIBRA -TABS) 100 MG tablet Take 1 tablet (100 mg total) by mouth 2 (two) times daily. 20 tablet 0   escitalopram  (LEXAPRO ) 10 MG tablet Take 1 tablet (10 mg total) by mouth daily. 90 tablet 3   furosemide  (LASIX ) 20 MG tablet TAKE 1 TABLET BY MOUTH DAILY 90 tablet 2   gabapentin  (NEURONTIN ) 300 MG capsule TAKE 1 CAPSULE BY MOUTH AT BEDTIME 90 capsule 3   hydrOXYzine (ATARAX) 25 MG tablet Take 25 mg by mouth 3 (three) times daily.     levonorgestrel (MIRENA) 20 MCG/24HR IUD 1 each by Intrauterine route once.     lisinopril  (ZESTRIL ) 40 MG tablet Take 1 tablet (40 mg total) by mouth daily. 90 tablet 3   meloxicam  (MOBIC ) 15 MG tablet Take 1 tablet (15 mg total) by mouth daily. 30 tablet 3   metFORMIN  (GLUCOPHAGE ) 500 MG tablet Take 1 tablet (500 mg total) by mouth daily. 30 tablet 0   minoxidil (LONITEN) 2.5 MG tablet Take by mouth.     nitroGLYCERIN  (NITROSTAT ) 0.4 MG SL tablet Place 1 tablet (0.4 mg total) under the tongue every 5 (five) minutes as needed for chest pain. 25 tablet 3   nystatin  (MYCOSTATIN ) 100000 UNIT/ML suspension Take 5 mLs (500,000 Units total) by mouth 4 (four) times daily. 60 mL 0   nystatin  cream (MYCOSTATIN ) Apply 1 Application topically 2 (two) times daily.     ondansetron  (ZOFRAN ) 4 MG tablet Take 1 tablet (4 mg total) by mouth every 8 (eight) hours as needed. 18 tablet 0    pantoprazole  (PROTONIX ) 40 MG tablet TAKE 1 TABLET BY MOUTH DAILY 90 tablet 3   spironolactone  (ALDACTONE ) 25 MG tablet TAKE 1 TABLET BY MOUTH DAILY 90 tablet 3   tirzepatide  (MOUNJARO ) 5 MG/0.5ML Pen Inject 5 mg into the skin once a week. 2 mL 0   Vitamin D , Ergocalciferol , (DRISDOL ) 1.25 MG (50000 UNIT) CAPS capsule Take 1 capsule (50,000 Units total) by mouth every 7 (seven) days. 12 capsule 0   No facility-administered medications prior to visit.   Allergies  Allergen Reactions   Percocet [Oxycodone -Acetaminophen ] Nausea Only   Hydrocodone -Acetaminophen  Nausea And Vomiting   Objective:   Today's Vitals   01/14/24 1600  BP: 126/78  Pulse: 89  Temp: 97.6 F (36.4 C)  TempSrc: Temporal  SpO2: 98%  Weight: 261 lb 6.4 oz (118.6 kg)  Height: 5' 3 (1.6 m)   Body mass index is 46.3 kg/m.   General: Well developed, well nourished. No acute distress. Psych: Alert and oriented. Normal mood and affect.  Health Maintenance Due  Topic Date Due   FOOT EXAM  Never done   OPHTHALMOLOGY EXAM  Never done   Diabetic kidney evaluation - Urine ACR  Never done   Hepatitis B Vaccines (1 of 3 - 19+ 3-dose series) Never done   Pneumococcal Vaccine: 19-49 Years (1 of 2 - PCV) 07/11/2013   INFLUENZA VACCINE  01/10/2024   Lab Results Lab Results  Component Value Date   HGBA1C 6.2 (H) 10/04/2023   HGBA1C 6.5 (H) 07/18/2023   HGBA1C 5.5 09/26/2022       Latest Ref Rng & Units 10/22/2023   12:55 PM 07/18/2023   12:37 PM 04/19/2023    3:52 PM  BMP  Glucose 70 - 99 mg/dL 67  827  99   BUN 6 - 24 mg/dL 16  11  15    Creatinine 0.57 - 1.00 mg/dL 9.14  9.23  9.20   BUN/Creat Ratio 9 - 23 19  14   SEE NOTE:   Sodium 134 - 144 mmol/L 139  139  134   Potassium 3.5 - 5.2 mmol/L 4.8  4.3  4.5   Chloride 96 - 106 mmol/L 100  101  98   CO2 20 - 29 mmol/L 22  24  26    Calcium  8.7 - 10.2 mg/dL 9.5  9.0  9.6      Assessment & Plan:   Problem List Items Addressed This Visit       Cardiovascular  and Mediastinum   Essential hypertension   Blood pressure is in good control. Continue carvedilol  CR 80 mg daily, lisinopril  40 mg daily, minoxidil 2.5 mg daily, amlodipine  10 mg daily, and spironolactone  25 mg daily.         Other   Obesity, Beginning BMI 47.7 - Primary   Maximum weight: 271 lbs (06/2023) Current weight: 261 lbs Weight change since last visit: - 4 lbs Total weight loss: - 10 lbs (3.6 %)  Tammy Hogan will continue to work with [facility]. Continue tirzepatide  and metformin . We will monitor her weight loss.      Prediabetes   By ADA criteria, Tammy Hogan has not met criteria for diabetes. However, I support her use of metformin  and tirzepatide  for management of her blood glucose and achieving weight loss to allow for knee surgery.       Return in about 3 months (around 04/15/2024) for Reassessment.   Garnette CHRISTELLA Simpler, MD

## 2024-01-14 NOTE — Assessment & Plan Note (Signed)
 Maximum weight: 271 lbs (06/2023) Current weight: 261 lbs Weight change since last visit: - 4 lbs Total weight loss: - 10 lbs (3.6 %)  Tammy Hogan will continue to work with [facility]. Continue tirzepatide  and metformin . We will monitor her weight loss.

## 2024-01-16 ENCOUNTER — Other Ambulatory Visit (INDEPENDENT_AMBULATORY_CARE_PROVIDER_SITE_OTHER): Payer: Self-pay | Admitting: Family Medicine

## 2024-01-16 DIAGNOSIS — E1169 Type 2 diabetes mellitus with other specified complication: Secondary | ICD-10-CM

## 2024-01-16 NOTE — Addendum Note (Signed)
 Addended by: GEROME ILEANA RAMAN on: 01/16/2024 08:17 AM   Modules accepted: Orders

## 2024-01-17 ENCOUNTER — Other Ambulatory Visit (INDEPENDENT_AMBULATORY_CARE_PROVIDER_SITE_OTHER): Payer: Self-pay | Admitting: Family Medicine

## 2024-01-17 DIAGNOSIS — E1169 Type 2 diabetes mellitus with other specified complication: Secondary | ICD-10-CM

## 2024-01-19 NOTE — Progress Notes (Signed)
 Chief Complaint: Patient was seen in consultation today for left knee pain.   Referring Physician(s): Corey,Evan S  History of Present Illness: Tammy Hogan is a 48 y.o. female with a medical history significant for anxiety/depression, CHF, HTN, obesity, DM and chronic left knee pain secondary to osteoarthritis. She works with Sports Medicine for her knee pain and has tried a variety of treatment strategies including steroid injections and the Zilretta  injections which were ineffective. At her last visit with [name] she received a Monovisc injection. She is currently working with a Systems analyst for weight loss.  Her BMI is currently too high to consider a knee replacement. She has been kindly referred to Interventional Radiology by [name] for consideration of geniculate artery embolization.     Womac Pain Score = 62/96 VAS Pain Score = 7/10   Past Medical History:  Diagnosis Date   Anal fissure    Anemia    past hx    Anginal pain (HCC)    Anxiety    [place], [place]   Anxiety    Back pain    Bimalleolar fracture of left ankle 07/10/2013   CHF (congestive heart failure) (HCC)    systolic heart failure 2014   Constipation    Depression    Depression    GERD (gastroesophageal reflux disease)    Heartburn    High blood pressure    High cholesterol    History of cardiac cath 07/23/2012   Dr Dann   History of echocardiogram    Cardiomyopathy-reduced EF 35-40% with wall m otion abnormality concerning for ischemia. Hypokinesis of the anterior and anteroseptal walls to the apex   HPV (human papilloma virus) infection    Hyperlipidemia    Hypertension    Joint pain    Palpitations    PCOS (polycystic ovarian syndrome)    pt denies   PPD positive, treated 2008   [place], [place]    Past Surgical History:  Procedure Laterality Date   CARDIAC CATHETERIZATION  feb, 2014   Dr Dann   FOOT SURGERY  10/2019   ORIF ANKLE FRACTURE Left 07/10/2013    Procedure: OPEN REDUCTION INTERNAL FIXATION (ORIF) LEFT BIMALLEOLAR ANKLE FRACTURE;  Surgeon: [name], MD;  Location: [facility];  Service: Orthopedics;  Laterality: Left;    Allergies: Percocet [oxycodone -acetaminophen ] and Hydrocodone -acetaminophen   Medications: Prior to Admission medications   Medication Sig Start Date End Date Taking? Authorizing Provider  amLODipine  (NORVASC ) 10 MG tablet TAKE 1 TABLET BY MOUTH DAILY 11/15/23   [name], MD  aspirin  81 MG tablet Take 81 mg by mouth daily.    [provider]  atorvastatin  (LIPITOR) 10 MG tablet Take 1 tablet (10 mg total) by mouth daily. 10/09/23   [name], MD  busPIRone  (BUSPAR ) 5 MG tablet TAKE 1 TABLET BY MOUTH DAILY AS NEEDED 10/21/23   [name], MD  carvedilol  (COREG  CR) 80 MG 24 hr capsule TAKE 1 CAPSULE BY MOUTH EVERY DAY 10/15/23   [name], MD  clobetasol  (TEMOVATE ) 0.05 % external solution Apply topically. 11/28/20   [provider]  clotrimazole -betamethasone  (LOTRISONE ) cream Apply 1 Application topically daily. 07/05/23   [name], MD  docusate sodium  (COLACE) 100 MG capsule Take 1 capsule (100 mg total) by mouth daily as needed. 01/08/24   [name], MD  doxycycline  (VIBRA -TABS) 100 MG tablet Take 1 tablet (100 mg total) by mouth 2 (two) times daily. 10/04/23  [name], MD  escitalopram  (LEXAPRO ) 10 MG tablet Take 1 tablet (10 mg total) by mouth daily. 03/08/23   [name], MD  furosemide  (LASIX ) 20 MG tablet TAKE 1 TABLET BY MOUTH DAILY 12/26/23   [name], MD  gabapentin  (NEURONTIN ) 300 MG capsule TAKE 1 CAPSULE BY MOUTH AT BEDTIME 12/26/23   [name], MD  hydrOXYzine (ATARAX) 25 MG tablet Take 25 mg by mouth 3 (three) times daily.    [provider]  levonorgestrel (MIRENA) 20 MCG/24HR IUD 1 each by Intrauterine route once.    [provider]  lisinopril  (ZESTRIL ) 40 MG tablet Take 1 tablet (40 mg total) by mouth daily.  10/18/23   [name], MD  meloxicam  (MOBIC ) 15 MG tablet Take 1 tablet (15 mg total) by mouth daily. 12/06/23   Joane Artist RAMAN, MD  metFORMIN  (GLUCOPHAGE ) 500 MG tablet Take 1 tablet (500 mg total) by mouth daily. 01/08/24 02/07/24  [name], MD  minoxidil (LONITEN) 2.5 MG tablet Take by mouth. 01/15/22   [provider]  nitroGLYCERIN  (NITROSTAT ) 0.4 MG SL tablet Place 1 tablet (0.4 mg total) under the tongue every 5 (five) minutes as needed for chest pain. 09/18/22   [name], MD  nystatin  (MYCOSTATIN ) 100000 UNIT/ML suspension Take 5 mLs (500,000 Units total) by mouth 4 (four) times daily. 04/19/23   [name], MD  nystatin  cream (MYCOSTATIN ) Apply 1 Application topically 2 (two) times daily. 08/27/22   [provider]  ondansetron  (ZOFRAN ) 4 MG tablet Take 1 tablet (4 mg total) by mouth every 8 (eight) hours as needed. 10/04/23   [name], MD  pantoprazole  (PROTONIX ) 40 MG tablet TAKE 1 TABLET BY MOUTH DAILY 07/15/23   [name], MD  spironolactone  (ALDACTONE ) 25 MG tablet TAKE 1 TABLET BY MOUTH DAILY 09/27/23   [name], MD  tirzepatide  (MOUNJARO ) 5 MG/0.5ML Pen Inject 5 mg into the skin once a week. 01/08/24   [name], MD  Vitamin D , Ergocalciferol , (DRISDOL ) 1.25 MG (50000 UNIT) CAPS capsule Take 1 capsule (50,000 Units total) by mouth every 7 (seven) days. 01/01/24   [name], MD     Family History  Problem Relation Age of Onset   Alcohol abuse Mother    Anxiety disorder Mother    Depression Mother    Hypertension Mother    Hyperlipidemia Mother    Colon polyps Mother    Other Father        drug overdose   Drug abuse Father        overdose   Hypertension Sister    Diabetes Maternal Uncle    Diabetes Maternal Grandmother    Cancer Maternal Grandmother    Heart failure Paternal Grandmother    Hypertension Paternal Grandmother    Diabetes Paternal Grandmother    Glaucoma Paternal Grandmother    Cirrhosis  Paternal Grandfather    Alcohol abuse Paternal Grandfather    Cancer Other 60       breast, cervical and colon cancer   Colon cancer Neg Hx    Esophageal cancer Neg Hx    Rectal cancer Neg Hx    Stomach cancer Neg Hx     Social History   Socioeconomic History   Marital status: Married    Spouse name: Not on file   Number of children: 0   Years of education: Not on file   Highest education level: Bachelor's degree (e.g., BA,  AB, BS)  Occupational History   Not on file  Tobacco Use   Smoking status: Former    Current packs/day: 0.00    Average packs/day: 0.5 packs/day for 16.0 years (8.0 ttl pk-yrs)    Types: Cigarettes    Start date: 05/16/2005    Quit date: 05/16/2021    Years since quitting: 2.6   Smokeless tobacco: Never  Vaping Use   Vaping status: Never Used  Substance and Sexual Activity   Alcohol use: Not Currently    Comment: socially   Drug use: No   Sexual activity: Yes    Birth control/protection: I.U.D.  Other Topics Concern   Not on file  Social History Narrative   Not on file   Social Drivers of Health   Financial Resource Strain: Low Risk  (01/13/2024)   Overall Financial Resource Strain (CARDIA)    Difficulty of Paying Living Expenses: Not hard at all  Food Insecurity: No Food Insecurity (01/13/2024)   Hunger Vital Sign    Worried About Running Out of Food in the Last Year: Never true    Ran Out of Food in the Last Year: Never true  Transportation Needs: No Transportation Needs (01/13/2024)   PRAPARE - Administrator, Civil Service (Medical): No    Lack of Transportation (Non-Medical): No  Physical Activity: Sufficiently Active (01/13/2024)   Exercise Vital Sign    Days of Exercise per Week: 4 days    Minutes of Exercise per Session: 60 min  Stress: No Stress Concern Present (01/13/2024)   [employer] of Occupational Health - Occupational Stress Questionnaire    Feeling of Stress: Only a little  Social Connections: Moderately Isolated  (01/13/2024)   Social Connection and Isolation Panel    Frequency of Communication with Friends and Family: Twice a week    Frequency of Social Gatherings with Friends and Family: Twice a week    Attends Religious Services: Never    Database administrator or Organizations: No    Attends Engineer, structural: Not on file    Marital Status: Married    Review of Systems: A 12 point ROS discussed and pertinent positives are indicated in the HPI above.  All other systems are negative.  Vital Signs: There were no vitals taken for this visit.  Physical Exam Constitutional:      General: She is not in acute distress. HENT:     Head: Normocephalic.     Mouth/Throat:     Mouth: Mucous membranes are moist.  Eyes:     General: No scleral icterus. Cardiovascular:     Rate and Rhythm: Normal rate and regular rhythm.  Pulmonary:     Effort: No respiratory distress.  Abdominal:     General: There is no distension.  Musculoskeletal:       Legs:     Comments: Tender to palpation  Skin:    General: Skin is warm and dry.     Coloration: Skin is not jaundiced.  Neurological:     Mental Status: She is alert and oriented to person, place, and time.     Imaging: Left knee 08/06/23   Left knee MRI 09/03/23    Labs:  CBC: Recent Labs    07/05/23 1427 07/18/23 1237  WBC 10.0 8.9  HGB 11.9 11.0*  HCT 36.6 34.8  PLT 548* 451*    COAGS: No results for input(s): INR, APTT in the last 8760 hours.  BMP: Recent Labs  04/19/23 1552 07/18/23 1237 10/22/23 1255  NA 134* 139 139  K 4.5 4.3 4.8  CL 98 101 100  CO2 26 24 22   GLUCOSE 99 172* 67*  BUN 15 11 16   CALCIUM  9.6 9.0 9.5  CREATININE 0.79 0.76 0.85    LIVER FUNCTION TESTS: Recent Labs    07/18/23 1237  BILITOT 0.3  AST 15  ALT 18  ALKPHOS 124*  PROT 6.3  ALBUMIN 3.7*    TUMOR MARKERS: No results for input(s): AFPTM, CEA, CA199, CHROMGRNA in the last 8760 hours.  Assessment and  Plan: 48 year old female with a history of chronic left knee pain (WOMAC 62/96) secondary to moderate osteoarthritis which has been refractory to conservative measures.  She would be an excellent candidate for geniculate artery embolization.  We discussed the rationale, periprocedural expectations, and long term expected outcomes of geniculate artery embolization.  She would like to proceed.  Plan for left geniculate artery embolization via antegrade femoral artery approach with moderate sedation at [facility].     Ester Sides, MD Pager: [phone]    I spent a total of  40 Minutes   in face to face in clinical consultation, greater than 50% of which was counseling/coordinating care for left knee pain.

## 2024-01-20 ENCOUNTER — Ambulatory Visit
Admission: RE | Admit: 2024-01-20 | Discharge: 2024-01-20 | Disposition: A | Discharge status: Home / Self Care | Source: Ambulatory Visit | Attending: Family Medicine | Admitting: Family Medicine

## 2024-01-20 DIAGNOSIS — M1712 Unilateral primary osteoarthritis, left knee: Secondary | ICD-10-CM

## 2024-01-20 DIAGNOSIS — G8929 Other chronic pain: Secondary | ICD-10-CM

## 2024-01-20 HISTORY — PX: IR RADIOLOGIST EVAL & MGMT: IMG5224

## 2024-01-21 ENCOUNTER — Other Ambulatory Visit: Payer: Self-pay | Admitting: Interventional Radiology

## 2024-01-21 DIAGNOSIS — M1712 Unilateral primary osteoarthritis, left knee: Secondary | ICD-10-CM

## 2024-01-21 DIAGNOSIS — M25562 Pain in left knee: Secondary | ICD-10-CM

## 2024-01-28 ENCOUNTER — Ambulatory Visit: Admitting: Family Medicine

## 2024-01-29 ENCOUNTER — Encounter (INDEPENDENT_AMBULATORY_CARE_PROVIDER_SITE_OTHER): Payer: Self-pay | Admitting: Family Medicine

## 2024-01-29 ENCOUNTER — Ambulatory Visit (INDEPENDENT_AMBULATORY_CARE_PROVIDER_SITE_OTHER): Admitting: Family Medicine

## 2024-01-29 VITALS — BP 135/82 | HR 94 | Temp 98.0°F | Ht 63.0 in | Wt 251.0 lb

## 2024-01-29 DIAGNOSIS — E1169 Type 2 diabetes mellitus with other specified complication: Secondary | ICD-10-CM

## 2024-01-29 DIAGNOSIS — Z6841 Body Mass Index (BMI) 40.0 and over, adult: Secondary | ICD-10-CM

## 2024-01-29 DIAGNOSIS — G8929 Other chronic pain: Secondary | ICD-10-CM | POA: Diagnosis not present

## 2024-01-29 DIAGNOSIS — E669 Obesity, unspecified: Secondary | ICD-10-CM

## 2024-01-29 DIAGNOSIS — K5903 Drug induced constipation: Secondary | ICD-10-CM | POA: Diagnosis not present

## 2024-01-29 DIAGNOSIS — E119 Type 2 diabetes mellitus without complications: Secondary | ICD-10-CM

## 2024-01-29 DIAGNOSIS — K5901 Slow transit constipation: Secondary | ICD-10-CM | POA: Insufficient documentation

## 2024-01-29 DIAGNOSIS — Z7984 Long term (current) use of oral hypoglycemic drugs: Secondary | ICD-10-CM

## 2024-01-29 DIAGNOSIS — Z7985 Long-term (current) use of injectable non-insulin antidiabetic drugs: Secondary | ICD-10-CM

## 2024-01-29 MED ORDER — DOCUSATE SODIUM 100 MG PO CAPS: 100.0000 mg | ORAL_CAPSULE | Freq: Every day | ORAL | 0 refills | Status: DC | PRN

## 2024-01-29 MED ORDER — TIRZEPATIDE 5 MG/0.5ML ~~LOC~~ SOAJ: 5.0000 mg | SUBCUTANEOUS | 0 refills | Status: DC

## 2024-01-29 MED ORDER — METFORMIN HCL 500 MG PO TABS: 500.0000 mg | ORAL_TABLET | Freq: Every day | ORAL | 0 refills | Status: DC

## 2024-01-29 NOTE — Progress Notes (Unsigned)
 Office: [phone]  /  Fax: [phone]  WEIGHT SUMMARY AND BIOMETRICS  Anthropometric Measurements Height: 5' 3 (1.6 m) Weight: 251 lb (113.9 kg) BMI (Calculated): 44.47 Weight at Last Visit: 254 lb Weight Lost Since Last Visit: 3 lb Weight Gained Since Last Visit: 0 Starting Weight: 269 lb Total Weight Loss (lbs): 18 lb (8.165 kg) Peak Weight: 275 lb   Body Composition  Body Fat %: 48.9 % Fat Mass (lbs): 123 lbs Muscle Mass (lbs): 122.2 lbs Total Body Water (lbs): 94.4 lbs Visceral Fat Rating : 16   Other Clinical Data RMR: 1570 Fasting: no Labs: no Today's Visit #: 11 Starting Date: 07/18/23    Chief Complaint: OBESITY    History of Present Illness Tammy Hogan is a 48 year old female with obesity and type 2 diabetes who presents for obesity treatment and progress assessment.  She is adhering to a category two eating plan approximately fifty percent of the time and engages in physical activity three days a week for sixty to ninety minutes, incorporating both cardio and strengthening exercises. Over the past three weeks, she has achieved a weight loss of three pounds.  Her type 2 diabetes is managed with metformin  and Mounjaro . She experiences constipation and uses Colace to alleviate this symptom.  She struggles with cravings for high-calorie foods such as candy, bread, and potatoes, which she finds difficult to resist. Occasionally, she indulges in high-calorie treats like cinnamon rolls and attempts to compensate by skipping meals or increasing exercise.  She works with a Systems analyst once a week, focusing on full-body workouts that include both cardio and strength training. Her exercise routine is modified due to a torn meniscus, arthritis, and a Baker's cyst in her knee, which limit certain activities.  She finds it challenging to meet her protein intake goals, achieving them only two to three times a week. Her daily caloric intake ranges  from 1100 to 1400 calories, but she often feels hungry after meals, particularly in the afternoon and before dinner.      PHYSICAL EXAM:  Blood pressure 135/82, pulse 94, temperature 98 F (36.7 C), height 5' 3 (1.6 m), weight 251 lb (113.9 kg), SpO2 97%. Body mass index is 44.46 kg/m.  DIAGNOSTIC DATA REVIEWED:  BMET    Component Value Date/Time   NA 139 10/22/2023 1255   K 4.8 10/22/2023 1255   CL 100 10/22/2023 1255   CO2 22 10/22/2023 1255   GLUCOSE 67 (L) 10/22/2023 1255   GLUCOSE 99 04/19/2023 1552   BUN 16 10/22/2023 1255   CREATININE 0.85 10/22/2023 1255   CREATININE 0.79 04/19/2023 1552   CALCIUM  9.5 10/22/2023 1255   GFRNONAA 89 08/05/2019 1705   GFRAA 102 08/05/2019 1705   Lab Results  Component Value Date   HGBA1C 6.2 (H) 10/04/2023   HGBA1C 5.4 07/03/2012   Lab Results  Component Value Date   INSULIN  55.6 (H) 07/18/2023   Lab Results  Component Value Date   TSH 1.740 07/18/2023   CBC    Component Value Date/Time   WBC 8.9 07/18/2023 1237   WBC 12.9 (H) 11/20/2022 0844   RBC 3.86 07/18/2023 1237   RBC 3.93 11/20/2022 0844   HGB 11.0 (L) 07/18/2023 1237   HCT 34.8 07/18/2023 1237   PLT 451 (H) 07/18/2023 1237   MCV 90 07/18/2023 1237   MCH 28.5 07/18/2023 1237   MCH 29.1 07/10/2013 1528   MCHC 31.6 07/18/2023 1237   MCHC 31.6 11/20/2022 0844  RDW 12.1 07/18/2023 1237   Iron Studies    Component Value Date/Time   IRON 51 07/05/2023 1427   TIBC 344 07/05/2023 1427   FERRITIN 104 07/05/2023 1427   IRONPCTSAT 15 07/05/2023 1427   Lipid Panel     Component Value Date/Time   CHOL 159 07/18/2023 1237   TRIG 113 07/18/2023 1237   HDL 36 (L) 07/18/2023 1237   CHOLHDL 3.9 09/26/2022 1133   CHOLHDL 5 05/10/2021 0813   VLDL 30.4 05/10/2021 0813   LDLCALC 102 (H) 07/18/2023 1237   Hepatic Function Panel     Component Value Date/Time   PROT 6.3 07/18/2023 1237   ALBUMIN 3.7 (L) 07/18/2023 1237   AST 15 07/18/2023 1237   ALT 18  07/18/2023 1237   ALKPHOS 124 (H) 07/18/2023 1237   BILITOT 0.3 07/18/2023 1237   BILIDIR 0.1 11/20/2022 0844      Component Value Date/Time   TSH 1.740 07/18/2023 1237   Nutritional Lab Results  Component Value Date   VD25OH 19.1 (L) 07/18/2023   VD25OH 35.51 05/10/2021   VD25OH 15.9 (L) 08/05/2019     Assessment and Plan Assessment & Plan Obesity Obesity management with a focus on dietary adherence and exercise. She is following the category two eating plan about 50% of the time and exercises three days a week, combining cardio and strength training. Weight loss of three pounds in the last three weeks. Challenges with cravings for high-calorie foods like cinnamon rolls. Emphasis on the importance of meeting protein goals to prevent excessive hunger and metabolic slowdown. Discussed strategies for managing cravings, including the importance of not feeling deprived and setting up strategies to manage evening cravings. - Continue category two eating plan - Encourage adherence to exercise routine - Emphasize meeting protein goals - Provide 100-calorie snack list for protein-rich options - Discuss strategies for managing cravings  Type 2 diabetes mellitus Type 2 diabetes managed with metformin  and Mounjaro . No reported issues with current medication regimen. Importance of maintaining dietary adherence and exercise to support diabetes management. - Continue metformin  and Mounjaro  - Encourage dietary adherence and exercise  Constipation secondary to Mounjaro  Constipation likely secondary to Mounjaro , managed with Colace. No reported issues with current management. - Continue Colace for constipation management  Torn meniscus, knee with osteoarthritis and Baker's cyst Chronic knee issues including torn meniscus, osteoarthritis, and Baker's cyst. She is working with a Systems analyst and sports medicine doctor to perform knee-friendly exercises. Scheduled for a procedure on August 29  to address knee pain. Emphasized the importance of not pushing herself to avoid injury and maintain exercise routine. - Continue knee-friendly exercises - Proceed with scheduled knee procedure on August 29     She was informed of the importance of frequent follow up visits to maximize her success with intensive lifestyle modifications for her multiple health conditions.    Louann Penton, MD

## 2024-01-30 ENCOUNTER — Ambulatory Visit: Payer: Self-pay | Admitting: Family Medicine

## 2024-02-05 ENCOUNTER — Telehealth: Payer: Self-pay

## 2024-02-06 ENCOUNTER — Telehealth: Payer: Self-pay

## 2024-02-06 MED ORDER — METHYLPREDNISOLONE 4 MG PO TBPK: ORAL_TABLET | ORAL | 0 refills | Status: DC

## 2024-02-06 NOTE — Progress Notes (Signed)
 Chief Complaint: Patient was seen in consultation today for left knee pain.  Referring Physician(s): Corey,Evan S   Patient Status: [facility] - Outpatient  History of Present Illness: Tammy Hogan is a 48 y.o. female with a medical history significant for anxiety/depression, CHF, HTN, obesity, DM and chronic left knee pain secondary to osteoarthritis. She works with Sports Medicine for her knee pain and has tried a variety of treatment strategies including steroid injections and the Zilretta  injections which were ineffective. At her last visit with [name] she received a Monovisc injection. She is currently working with a Systems analyst for weight loss.   Her BMI is currently too high to consider a knee replacement and she was referred to IR to discuss geniculate artery embolization as a possible treatment option. We met in consultation 01/20/24 and discussed the rationale, periprocedural expectations, and long term expected outcomes of geniculate artery embolization. She was agreeable to proceed.   Past Medical History:  Diagnosis Date   Anal fissure    Anemia    past hx    Anginal pain (HCC)    Anxiety    Tammy Hogan, Tammy Tammy Hogan   Anxiety    Back pain    Bimalleolar fracture of left ankle 07/10/2013   CHF (congestive heart failure) (HCC)    systolic heart failure 2014   Constipation    Depression    Depression    GERD (gastroesophageal reflux disease)    Heartburn    High blood pressure    High cholesterol    History of cardiac cath 07/23/2012   Dr Dann   History of echocardiogram    Cardiomyopathy-reduced EF 35-40% with wall m otion abnormality concerning for ischemia. Hypokinesis of the anterior and anteroseptal walls to the apex   HPV (human papilloma virus) infection    Hyperlipidemia    Hypertension    Joint pain    Palpitations    PCOS (polycystic ovarian syndrome)    pt denies   PPD positive, treated 2008   Tammy Hogan, [place]    Past Surgical  History:  Procedure Laterality Date   CARDIAC CATHETERIZATION  feb, 2014   Dr Dann   FOOT SURGERY  10/2019   IR RADIOLOGIST EVAL & MGMT  01/20/2024   ORIF ANKLE FRACTURE Left 07/10/2013   Procedure: OPEN REDUCTION INTERNAL FIXATION (ORIF) LEFT BIMALLEOLAR ANKLE FRACTURE;  Surgeon: [name], MD;  Location: [facility];  Service: Orthopedics;  Laterality: Left;    Allergies: Percocet [oxycodone -acetaminophen ] and Hydrocodone -acetaminophen   Medications: Prior to Admission medications   Medication Sig Start Date End Date Taking? Authorizing Provider  amLODipine  (NORVASC ) 10 MG tablet TAKE 1 TABLET BY MOUTH DAILY 11/15/23   [name], MD  aspirin  81 MG tablet Take 81 mg by mouth daily.    [provider]  atorvastatin  (LIPITOR) 10 MG tablet Take 1 tablet (10 mg total) by mouth daily. 10/09/23   [name], MD  busPIRone  (BUSPAR ) 5 MG tablet TAKE 1 TABLET BY MOUTH DAILY AS NEEDED 10/21/23   [name], MD  carvedilol  (COREG  CR) 80 MG 24 hr capsule TAKE 1 CAPSULE BY MOUTH EVERY DAY 10/15/23   [name], MD  clotrimazole -betamethasone  (LOTRISONE ) cream Apply 1 Application topically daily. 07/05/23   [name], MD  docusate sodium  (COLACE) 100 MG capsule Take 1 capsule (100 mg total) by mouth daily as needed. 01/29/24   [name], MD  doxycycline  (VIBRA -TABS) 100 MG tablet Take 1  tablet (100 mg total) by mouth 2 (two) times daily. 10/04/23   [name], MD  escitalopram  (LEXAPRO ) 10 MG tablet Take 1 tablet (10 mg total) by mouth daily. 03/08/23   [name], MD  furosemide  (LASIX ) 20 MG tablet TAKE 1 TABLET BY MOUTH DAILY 12/26/23   [name], MD  gabapentin  (NEURONTIN ) 300 MG capsule TAKE 1 CAPSULE BY MOUTH AT BEDTIME 12/26/23   [name], MD  hydrOXYzine (ATARAX) 25 MG tablet Take 25 mg by mouth 3 (three) times daily.    [provider]  levonorgestrel (MIRENA) 20 MCG/24HR IUD 1 each by Intrauterine route once.    [provider]  lisinopril  (ZESTRIL ) 40 MG tablet Take 1 tablet (40 mg total) by mouth daily. 10/18/23   [name], MD  meloxicam  (MOBIC ) 15 MG tablet Take 1 tablet (15 mg total) by mouth daily. 12/06/23   Joane Artist RAMAN, MD  metFORMIN  (GLUCOPHAGE ) 500 MG tablet Take 1 tablet (500 mg total) by mouth daily. 01/29/24 02/28/24  [name], MD  methylPREDNISolone  (MEDROL  DOSEPAK) 4 MG TBPK tablet Pt to follow taper per pharmacy 02/07/24   Aleana Fifita J, MD  minoxidil (LONITEN) 2.5 MG tablet Take by mouth. 01/15/22   [provider]  nitroGLYCERIN  (NITROSTAT ) 0.4 MG SL tablet Place 1 tablet (0.4 mg total) under the tongue every 5 (five) minutes as needed for chest pain. 09/18/22   [name], MD  nystatin  (MYCOSTATIN ) 100000 UNIT/ML suspension Take 5 mLs (500,000 Units total) by mouth 4 (four) times daily. 04/19/23   [name], MD  nystatin  cream (MYCOSTATIN ) Apply 1 Application topically 2 (two) times daily. 08/27/22   [provider]  ondansetron  (ZOFRAN ) 4 MG tablet Take 1 tablet (4 mg total) by mouth every 8 (eight) hours as needed. 10/04/23   [name], MD  pantoprazole  (PROTONIX ) 40 MG tablet TAKE 1 TABLET BY MOUTH DAILY 07/15/23   [name], MD  spironolactone  (ALDACTONE ) 25 MG tablet TAKE 1 TABLET BY MOUTH DAILY 09/27/23   [name], MD  tirzepatide  (MOUNJARO ) 5 MG/0.5ML Pen Inject 5 mg into the skin once a week. 01/29/24   [name], MD  Vitamin D , Ergocalciferol , (DRISDOL ) 1.25 MG (50000 UNIT) CAPS capsule Take 1 capsule (50,000 Units total) by mouth every 7 (seven) days. 01/01/24   [name], MD     Family History  Problem Relation Age of Onset   Alcohol abuse Mother    Anxiety disorder Mother    Depression Mother    Hypertension Mother    Hyperlipidemia Mother    Colon polyps Mother    Other Father        drug overdose   Drug abuse Father        overdose   Hypertension Sister    Diabetes Maternal Uncle    Diabetes  Maternal Grandmother    Cancer Maternal Grandmother    Heart failure Paternal Grandmother    Hypertension Paternal Grandmother    Diabetes Paternal Grandmother    Glaucoma Paternal Grandmother    Cirrhosis Paternal Grandfather    Alcohol abuse Paternal Grandfather    Cancer Other 43       breast, cervical and colon cancer   Colon cancer Neg Hx    Esophageal cancer Neg Hx    Rectal cancer Neg Hx    Stomach cancer Neg Hx     Social History   Socioeconomic History  Marital status: Married    Spouse name: Not on file   Number of children: 0   Years of education: Not on file   Highest education level: Bachelor's degree (e.g., BA, AB, BS)  Occupational History   Not on file  Tobacco Use   Smoking status: Former    Current packs/day: 0.00    Average packs/day: 0.5 packs/day for 16.0 years (8.0 ttl pk-yrs)    Types: Cigarettes    Start date: 05/16/2005    Quit date: 05/16/2021    Years since quitting: 2.7   Smokeless tobacco: Never  Vaping Use   Vaping status: Never Used  Substance and Sexual Activity   Alcohol use: Not Currently    Comment: socially   Drug use: No   Sexual activity: Yes    Birth control/protection: I.U.D.  Other Topics Concern   Not on file  Social History Narrative   Not on file   Social Drivers of Health   Financial Resource Strain: Low Risk  (01/13/2024)   Overall Financial Resource Strain (CARDIA)    Difficulty of Paying Living Expenses: Not hard at all  Food Insecurity: No Food Insecurity (01/13/2024)   Hunger Vital Sign    Worried About Running Out of Food in the Last Year: Never true    Ran Out of Food in the Last Year: Never true  Transportation Needs: No Transportation Needs (01/13/2024)   PRAPARE - Administrator, Civil Service (Medical): No    Lack of Transportation (Non-Medical): No  Physical Activity: Sufficiently Active (01/13/2024)   Exercise Vital Sign    Days of Exercise per Week: 4 days    Minutes of Exercise per Session:  60 min  Stress: No Stress Concern Present (01/13/2024)   [employer] of Occupational Health - Occupational Stress Questionnaire    Feeling of Stress: Only a little  Social Connections: Moderately Isolated (01/13/2024)   Social Connection and Isolation Panel    Frequency of Communication with Friends and Family: Twice a week    Frequency of Social Gatherings with Friends and Family: Twice a week    Attends Religious Services: Never    Database administrator or Organizations: No    Attends Engineer, structural: Not on file    Marital Status: Married    Review of Systems: A 12 point ROS discussed and pertinent positives are indicated in the HPI above.  All other systems are negative.  Review of Systems  Vital Signs: There were no vitals taken for this visit.  Physical Exam  Imaging: Imaging: Left knee 08/06/23    Left knee MRI 09/03/23     Labs:  CBC: Recent Labs    07/05/23 1427 07/18/23 1237  WBC 10.0 8.9  HGB 11.9 11.0*  HCT 36.6 34.8  PLT 548* 451*    COAGS: No results for input(s): INR, APTT in the last 8760 hours.  BMP: Recent Labs    04/19/23 1552 07/18/23 1237 10/22/23 1255  NA 134* 139 139  K 4.5 4.3 4.8  CL 98 101 100  CO2 26 24 22   GLUCOSE 99 172* 67*  BUN 15 11 16   CALCIUM  9.6 9.0 9.5  CREATININE 0.79 0.76 0.85    LIVER FUNCTION TESTS: Recent Labs    07/18/23 1237  BILITOT 0.3  AST 15  ALT 18  ALKPHOS 124*  PROT 6.3  ALBUMIN 3.7*    TUMOR MARKERS: No results for input(s): AFPTM, CEA, CA199, CHROMGRNA in the last 8760 hours.  Assessment and Plan:  Left knee pain: Tammy Hogan, 48 year old female, presents today for an image-guided left geniculate artery embolization.   Risks and benefits of this procedure were discussed with the patient including, but not limited to bleeding, infection, vascular injury or contrast induced renal failure.  All of the patient's questions were answered, patient is  agreeable to proceed. She has been NPO.  Consent signed and in chart.  Thank you for this interesting consult.  I greatly enjoyed meeting [facility] and look forward to participating in their care.  A copy of this report was sent to the requesting provider on this date.  Ester Sides, MD Pager: [phone]    I spent a total of  30 Minutes   in face to face in clinical consultation, greater than 50% of which was counseling/coordinating care for left knee pain

## 2024-02-06 NOTE — Discharge Instructions (Addendum)

## 2024-02-06 NOTE — Telephone Encounter (Signed)
 Patient was called and reminded about upcoming GAE procedure on 02/07/24 and told that the medrol  dosepak was called in to [facility] on [address]. It is to be taken as prescribed after her GAE procedure.

## 2024-02-07 ENCOUNTER — Ambulatory Visit
Admission: RE | Admit: 2024-02-07 | Discharge: 2024-02-07 | Disposition: A | Discharge status: Home / Self Care | Source: Ambulatory Visit | Attending: Interventional Radiology | Admitting: Interventional Radiology

## 2024-02-07 ENCOUNTER — Ambulatory Visit: Admitting: Psychology

## 2024-02-07 DIAGNOSIS — M25562 Pain in left knee: Secondary | ICD-10-CM

## 2024-02-07 DIAGNOSIS — F331 Major depressive disorder, recurrent, moderate: Secondary | ICD-10-CM

## 2024-02-07 DIAGNOSIS — M1712 Unilateral primary osteoarthritis, left knee: Secondary | ICD-10-CM

## 2024-02-07 HISTORY — PX: IR EMBO ARTERIAL NOT HEMORR HEMANG INC GUIDE ROADMAPPING: IMG5448

## 2024-02-07 MED ORDER — FENTANYL CITRATE (PF) 100 MCG/2ML IJ SOLN
INTRAMUSCULAR | Status: AC | PRN
  Administered 2024-02-07 (×2): 50 ug via INTRAVENOUS

## 2024-02-07 MED ORDER — SODIUM CHLORIDE 0.9 % IV SOLN: INTRAVENOUS | Status: DC

## 2024-02-07 MED ORDER — IIOPAMIDOL (ISOVUE-250) INJECTION 51%
100.0000 mL | Freq: Once | INTRAVENOUS | Status: AC | PRN
  Administered 2024-02-07: 75 mL via INTRA_ARTERIAL

## 2024-02-07 MED ORDER — NITROGLYCERIN 1 MG/10 ML FOR IR/CATH LAB
100.0000 ug | INTRA_ARTERIAL | Status: DC | PRN
  Administered 2024-02-07 (×2): 100 ug via INTRA_ARTERIAL

## 2024-02-07 MED ORDER — SODIUM CHLORIDE 0.9 % IV SOLN
8.0000 mg | Freq: Once | INTRAVENOUS | Status: AC
  Administered 2024-02-07: 8 mg via INTRAVENOUS

## 2024-02-07 MED ORDER — ACETAMINOPHEN 10 MG/ML IV SOLN
1000.0000 mg | Freq: Once | INTRAVENOUS | Status: AC
  Administered 2024-02-07: 1000 mg via INTRAVENOUS

## 2024-02-07 MED ORDER — MIDAZOLAM HCL 2 MG/2ML IJ SOLN: 1.0000 mg | INTRAMUSCULAR | Status: DC | PRN

## 2024-02-07 MED ORDER — FENTANYL CITRATE PF 50 MCG/ML IJ SOSY: 25.0000 ug | PREFILLED_SYRINGE | INTRAMUSCULAR | Status: DC | PRN

## 2024-02-07 MED ORDER — DEXAMETHASONE SODIUM PHOSPHATE 10 MG/ML IJ SOLN
10.0000 mg | Freq: Once | INTRAMUSCULAR | Status: AC
  Administered 2024-02-07: 10 mg via INTRAVENOUS

## 2024-02-07 MED ORDER — MIDAZOLAM HCL 2 MG/2ML IJ SOLN
INTRAMUSCULAR | Status: AC | PRN
  Administered 2024-02-07 (×2): 1 mg via INTRAVENOUS

## 2024-02-07 MED ORDER — LIDOCAINE-EPINEPHRINE 1 %-1:100000 IJ SOLN
10.0000 mL | Freq: Once | INTRAMUSCULAR | Status: AC
  Administered 2024-02-07: 10 mL via INTRADERMAL

## 2024-02-07 MED ORDER — KETOROLAC TROMETHAMINE 30 MG/ML IJ SOLN
30.0000 mg | Freq: Once | INTRAMUSCULAR | Status: AC
  Administered 2024-02-07: 30 mg via INTRAVENOUS

## 2024-02-07 NOTE — Procedures (Signed)
 Interventional Radiology Procedure Note  Procedure: Left geniculate artery embolization   Findings: Please refer to procedural dictation for full description. Left proximal SFA 4 Fr access, manual compression for hemostasis.  Complications: None immediate  Estimated Blood Loss: < 5 ml  Recommendations: IR will arrange 1 month outpatient follow up.   Ester Sides, MD

## 2024-02-07 NOTE — Progress Notes (Signed)
 [facility] Counselor/Therapist Progress Note  Patient ID: Tammy Hogan, MRN: [id number],    Date: 02/07/2024  Time Spent: 4:00pm-4:50pm   50 minutes   Treatment Type: Individual Therapy  Reported Symptoms: stress  Mental Status Exam: Appearance:  Casual     Behavior: Appropriate  Motor: Normal  Speech/Language:  Normal Rate  Affect: Appropriate  Mood: normal  Thought process: normal  Thought content:   WNL  Sensory/Perceptual disturbances:   WNL  Orientation: oriented to person, place, time/date, and situation  Attention: Good  Concentration: Good  Memory: WNL  Fund of knowledge:  Good  Insight:   Good  Judgment:  Good  Impulse Control: Good   Risk Assessment: Danger to Self:  No Self-injurious Behavior: No Danger to Others: No Duty to Warn:no Physical Aggression / Violence:No  Access to Firearms a concern: No  Gang Involvement:No   Subjective: Pt present for face-to-face individual therapy via video.  Pt consents to telehealth video session and is aware of limitations and benefits of virtual sessions. Location of pt: home Location of therapist: home office.   Pt talked about her job.  She had a meeting with her supervisor and was given good feedback about her job performance except for issues with coming to work late.  Pt is also aften out of work for doctors' appointments.   Pt states she is going to work on trying to be a better team member. Pt states she feels sad at times bc she needs to be a better person.  Pt has become aware that she tends to be selfish.  Addressed how this increase in awareness is the first important step is making changes.   Addressed how pt can consider other people in her decisions.  She plans to try to help her husband more with house work.   Pt talked about her health.  She had knee surgery today and hopes that the procedure will reduce her pain for 2 years.   Pt continues to go to [employer] [facility].   She is on a low carb diet.   Pt also continues to go to [employer] and has been exercising 4 times a week.  Pt is taking monjaro for weight loss but has been losing weight and is beginning to see a difference in how she feels.   Pt talked about her relationship with her mother.  Pt does not like the way her mother treats her.  Addressed their interactions and helped pt process her feelings and relationship dynamics.   Worked on self care strategies.   Provided supportive therapy.    Interventions: Cognitive Behavioral Therapy and Insight-Oriented  Diagnosis: F33.1  Plan of Care: Recommend ongoing therapy.   Pt participated in setting treatment goals.    Pt states she wants to find some peace.   Pt wants to improve coping skills.   Plan to meet every two weeks.   Pt agrees with treatment plan.   Treatment Plan Client Abilities/Strengths  Pt is bright, engaging, and motivated for therapy.   Client Treatment Preferences  Individual therapy.  Client Statement of Needs  Improve coping skills.  Symptoms  Depressed or irritable mood. Sleeplessness or hypersomnia. Poor concentration and indecisiveness. Low self-esteem.  Problems Addressed  Unipolar Depression Goals 1. Alleviate depressive symptoms and return to previous level of effective functioning. 2. Appropriately grieve the loss in order to normalize mood and to return to previously adaptive level of functioning. Objective Learn and implement behavioral strategies  to overcome depression. Target Date: 2024-04-08 Frequency: Biweekly  Progress: 10 Modality: individual  Related Interventions Engage the client in behavioral activation, increasing his/her activity level and contact with sources of reward, while identifying processes that inhibit activation.  Use behavioral techniques such as instruction, rehearsal, role-playing, role reversal, as needed, to facilitate activity in the client's daily life; reinforce success. Assist the  client in developing skills that increase the likelihood of deriving pleasure from behavioral activation (e.g., assertiveness skills, developing an exercise plan, less internal/more external focus, increased social involvement); reinforce success. Objective Identify important people in life, past and present, and describe the quality, good and poor, of those relationships. Target Date: 2024-04-08 Frequency: Biweekly  Progress: 10 Modality: individual  Related Interventions Conduct Interpersonal Therapy beginning with the assessment of the client's interpersonal inventory of important past and present relationships; develop a case formulation linking depression to grief, interpersonal role disputes, role transitions, and/or interpersonal deficits). Objective Learn and implement problem-solving and decision-making skills. Target Date: 2024-04-08 Frequency: Biweekly  Progress: 10 Modality: individual  Related Interventions Conduct Problem-Solving Therapy using techniques such as psychoeducation, modeling, and role-playing to teach client problem-solving skills (i.e., defining a problem specifically, generating possible solutions, evaluating the pros and cons of each solution, selecting and implementing a plan of action, evaluating the efficacy of the plan, accepting or revising the plan); role-play application of the problem-solving skill to a real life issue. Encourage in the client the development of a positive problem orientation in which problems and solving them are viewed as a natural part of life and not something to be feared, despaired, or avoided. 3. Develop healthy interpersonal relationships that lead to the alleviation and help prevent the relapse of depression. 4. Develop healthy thinking patterns and beliefs about self, others, and the world that lead to the alleviation and help prevent the relapse of depression. 5. Recognize, accept, and cope with feelings of  depression. Diagnosis F33.1  Conditions For Discharge Achievement of treatment goals and objectives   Veva Alma, LCSW                 Mayfield Schoene [place], LCSW               Dametri Ozburn [place], LCSW

## 2024-02-12 ENCOUNTER — Other Ambulatory Visit: Payer: Self-pay | Admitting: Interventional Radiology

## 2024-02-12 DIAGNOSIS — M25562 Pain in left knee: Secondary | ICD-10-CM

## 2024-02-25 ENCOUNTER — Encounter (INDEPENDENT_AMBULATORY_CARE_PROVIDER_SITE_OTHER): Payer: Self-pay | Admitting: Family Medicine

## 2024-02-25 ENCOUNTER — Ambulatory Visit (INDEPENDENT_AMBULATORY_CARE_PROVIDER_SITE_OTHER): Admitting: Family Medicine

## 2024-02-25 VITALS — BP 139/80 | HR 97 | Temp 98.6°F | Ht 63.0 in | Wt 247.0 lb

## 2024-02-25 DIAGNOSIS — E669 Obesity, unspecified: Secondary | ICD-10-CM | POA: Diagnosis not present

## 2024-02-25 DIAGNOSIS — K5901 Slow transit constipation: Secondary | ICD-10-CM | POA: Diagnosis not present

## 2024-02-25 DIAGNOSIS — Z6841 Body Mass Index (BMI) 40.0 and over, adult: Secondary | ICD-10-CM | POA: Insufficient documentation

## 2024-02-25 DIAGNOSIS — E1169 Type 2 diabetes mellitus with other specified complication: Secondary | ICD-10-CM

## 2024-02-25 DIAGNOSIS — Z7985 Long-term (current) use of injectable non-insulin antidiabetic drugs: Secondary | ICD-10-CM

## 2024-02-25 DIAGNOSIS — E119 Type 2 diabetes mellitus without complications: Secondary | ICD-10-CM | POA: Diagnosis not present

## 2024-02-25 DIAGNOSIS — Z7984 Long term (current) use of oral hypoglycemic drugs: Secondary | ICD-10-CM

## 2024-02-25 MED ORDER — TIRZEPATIDE 5 MG/0.5ML ~~LOC~~ SOAJ: 5.0000 mg | SUBCUTANEOUS | 0 refills | Status: DC

## 2024-02-25 MED ORDER — DOCUSATE SODIUM 100 MG PO CAPS: 100.0000 mg | ORAL_CAPSULE | Freq: Every day | ORAL | 0 refills | Status: DC | PRN

## 2024-02-25 MED ORDER — METFORMIN HCL 500 MG PO TABS: 500.0000 mg | ORAL_TABLET | Freq: Every day | ORAL | 0 refills | Status: DC

## 2024-02-25 NOTE — Progress Notes (Signed)
 Office: [phone]  /  Fax: [phone]  WEIGHT SUMMARY AND BIOMETRICS  Anthropometric Measurements Height: 5' 3 (1.6 m) Weight: 247 lb (112 kg) BMI (Calculated): 43.76 Weight at Last Visit: 251 lb Weight Lost Since Last Visit: 4 lb Weight Gained Since Last Visit: 0 Starting Weight: 269 lb Total Weight Loss (lbs): 22 lb (9.979 kg) Peak Weight: 275 lb   Body Composition  Body Fat %: 48.9 % Fat Mass (lbs): 121 lbs Muscle Mass (lbs): 120.2 lbs Total Body Water (lbs): 91.8 lbs Visceral Fat Rating : 16   Other Clinical Data Fasting: no Labs: no Today's Visit #: 12 Starting Date: 07/18/23    Chief Complaint: OBESITY    History of Present Illness Tammy Hogan is a 48 year old female with obesity who presents for a follow-up on her weight management plan.  She is following a category two eating plan for obesity treatment, adhering to it about fifty percent of the time. She focuses on increasing her intake of fruits and vegetables and drinking more water but struggles to meet protein goals and get seven to nine hours of sleep per night. Despite these challenges, she has lost four pounds in the last month.  She is currently on a dose of five milligrams of Mounjaro  and reports that her hunger is manageable at this dose. She is concerned that increasing the dose might suppress her hunger too much, leading to difficulty in eating adequately. She has not experienced any gastrointestinal issues with this medication.  She is also taking metformin  and Colace, with the latter helping her maintain regular bowel movements. She supplements with a fiber gummy and attributes her regularity to increased water intake, averaging forty-eight ounces per day.  She has a supportive home environment, with her husband also participating in the weight management program. She has a friendly competition, and she has developed strategies such as cooking protein in advance to manage meals  efficiently after gym sessions. She attends the gym most nights after work.  She has Zofran  available for nausea, which she experiences with certain narcotics, but she has not needed to use it recently. She had a knee procedure a few weeks ago, which was when the Zofran  was prescribed.      PHYSICAL EXAM:  Blood pressure 139/80, pulse 97, temperature 98.6 F (37 C), height 5' 3 (1.6 m), weight 247 lb (112 kg), SpO2 100%. Body mass index is 43.75 kg/m.  DIAGNOSTIC DATA REVIEWED:  BMET    Component Value Date/Time   NA 139 10/22/2023 1255   K 4.8 10/22/2023 1255   CL 100 10/22/2023 1255   CO2 22 10/22/2023 1255   GLUCOSE 67 (L) 10/22/2023 1255   GLUCOSE 99 04/19/2023 1552   BUN 16 10/22/2023 1255   CREATININE 0.85 10/22/2023 1255   CREATININE 0.79 04/19/2023 1552   CALCIUM  9.5 10/22/2023 1255   GFRNONAA 89 08/05/2019 1705   GFRAA 102 08/05/2019 1705   Lab Results  Component Value Date   HGBA1C 6.2 (H) 10/04/2023   HGBA1C 5.4 07/03/2012   Lab Results  Component Value Date   INSULIN  55.6 (H) 07/18/2023   Lab Results  Component Value Date   TSH 1.740 07/18/2023   CBC    Component Value Date/Time   WBC 8.9 07/18/2023 1237   WBC 12.9 (H) 11/20/2022 0844   RBC 3.86 07/18/2023 1237   RBC 3.93 11/20/2022 0844   HGB 11.0 (L) 07/18/2023 1237   HCT 34.8 07/18/2023 1237   PLT 451 (  H) 07/18/2023 1237   MCV 90 07/18/2023 1237   MCH 28.5 07/18/2023 1237   MCH 29.1 07/10/2013 1528   MCHC 31.6 07/18/2023 1237   MCHC 31.6 11/20/2022 0844   RDW 12.1 07/18/2023 1237   Iron Studies    Component Value Date/Time   IRON 51 07/05/2023 1427   TIBC 344 07/05/2023 1427   FERRITIN 104 07/05/2023 1427   IRONPCTSAT 15 07/05/2023 1427   Lipid Panel     Component Value Date/Time   CHOL 159 07/18/2023 1237   TRIG 113 07/18/2023 1237   HDL 36 (L) 07/18/2023 1237   CHOLHDL 3.9 09/26/2022 1133   CHOLHDL 5 05/10/2021 0813   VLDL 30.4 05/10/2021 0813   LDLCALC 102 (H) 07/18/2023  1237   Hepatic Function Panel     Component Value Date/Time   PROT 6.3 07/18/2023 1237   ALBUMIN 3.7 (L) 07/18/2023 1237   AST 15 07/18/2023 1237   ALT 18 07/18/2023 1237   ALKPHOS 124 (H) 07/18/2023 1237   BILITOT 0.3 07/18/2023 1237   BILIDIR 0.1 11/20/2022 0844      Component Value Date/Time   TSH 1.740 07/18/2023 1237   Nutritional Lab Results  Component Value Date   VD25OH 19.1 (L) 07/18/2023   VD25OH 35.51 05/10/2021   VD25OH 15.9 (L) 08/05/2019     Assessment and Plan Assessment & Plan Obesity Obesity management is ongoing with dietary modifications and medication. She follows the category two eating plan 50% of the time, working on increasing fruits, vegetables, and water intake. She is not meeting protein goals and lacks adequate sleep, both important for weight loss. She lost four pounds in the last month. She is on Mounjaro  5 mg and prefers not to increase the dose to maintain normal hunger cues. She engages in regular physical activity and meal prepping. - Continue Mounjaro  5 mg - Encourage adherence to the category two eating plan - Promote increased intake of fruits, vegetables, and water - Encourage meeting protein goals - Advise on achieving 7-9 hours of sleep per night - Support regular physical activity and meal prepping  Type 2 diabetes mellitus without complications Type 2 diabetes is managed with metformin , requiring a refill. No complications are present. She is mindful of diet and exercise, crucial for diabetes management. - Refill metformin  prescription  Slow transit constipation Constipation is well-managed with Colace and increased water intake. She uses a fiber gummy as needed and reports regular bowel movements. - Continue Colace as needed - Maintain adequate water intake - Use fiber gummies as needed       [name] was informed of the importance of frequent follow up visits to maximize her success with intensive lifestyle modifications for  her obesity and obesity related health conditions as recommended by USPSTF and CMS guidelines   Tammy Penton, MD

## 2024-02-26 LAB — HEMOGLOBIN A1C
Est. average glucose Bld gHb Est-mCnc: 111 mg/dL
Hgb A1c MFr Bld: 5.5 % (ref 4.8–5.6)

## 2024-02-26 LAB — CBC WITH DIFFERENTIAL/PLATELET
Basophils Absolute: 0 x10E3/uL (ref 0.0–0.2)
Basos: 0 %
EOS (ABSOLUTE): 0.1 x10E3/uL (ref 0.0–0.4)
Eos: 1 %
Hematocrit: 36.9 % (ref 34.0–46.6)
Hemoglobin: 11.5 g/dL (ref 11.1–15.9)
Immature Grans (Abs): 0 x10E3/uL (ref 0.0–0.1)
Immature Granulocytes: 0 %
Lymphocytes Absolute: 2.4 x10E3/uL (ref 0.7–3.1)
Lymphs: 34 %
MCH: 27.8 pg (ref 26.6–33.0)
MCHC: 31.2 g/dL — ABNORMAL LOW (ref 31.5–35.7)
MCV: 89 fL (ref 79–97)
Monocytes Absolute: 0.6 x10E3/uL (ref 0.1–0.9)
Monocytes: 8 %
Neutrophils Absolute: 3.9 x10E3/uL (ref 1.4–7.0)
Neutrophils: 57 %
Platelets: 406 x10E3/uL (ref 150–450)
RBC: 4.14 x10E6/uL (ref 3.77–5.28)
RDW: 13.2 % (ref 11.7–15.4)
WBC: 7 x10E3/uL (ref 3.4–10.8)

## 2024-02-26 LAB — CMP14+EGFR
ALT: 14 IU/L (ref 0–32)
AST: 11 IU/L (ref 0–40)
Albumin: 3.8 g/dL — ABNORMAL LOW (ref 3.9–4.9)
Alkaline Phosphatase: 108 IU/L (ref 41–116)
BUN/Creatinine Ratio: 12 (ref 9–23)
BUN: 11 mg/dL (ref 6–24)
Bilirubin Total: 0.3 mg/dL (ref 0.0–1.2)
CO2: 22 mmol/L (ref 20–29)
Calcium: 9.6 mg/dL (ref 8.7–10.2)
Chloride: 99 mmol/L (ref 96–106)
Creatinine, Ser: 0.92 mg/dL (ref 0.57–1.00)
Globulin, Total: 2.6 g/dL (ref 1.5–4.5)
Glucose: 94 mg/dL (ref 70–99)
Potassium: 4.4 mmol/L (ref 3.5–5.2)
Sodium: 137 mmol/L (ref 134–144)
Total Protein: 6.4 g/dL (ref 6.0–8.5)
eGFR: 76 mL/min/1.73 (ref >59)

## 2024-02-26 LAB — LIPID PANEL WITH LDL/HDL RATIO
Cholesterol, Total: 163 mg/dL (ref 100–199)
HDL: 33 mg/dL — ABNORMAL LOW (ref >39)
LDL Chol Calc (NIH): 103 mg/dL — ABNORMAL HIGH (ref 0–99)
LDL/HDL Ratio: 3.1 ratio (ref 0.0–3.2)
Triglycerides: 152 mg/dL — ABNORMAL HIGH (ref 0–149)
VLDL Cholesterol Cal: 27 mg/dL (ref 5–40)

## 2024-02-26 LAB — INSULIN, RANDOM: INSULIN: 33.2 u[IU]/mL — ABNORMAL HIGH (ref 2.6–24.9)

## 2024-02-26 LAB — VITAMIN D 25 HYDROXY (VIT D DEFICIENCY, FRACTURES): Vit D, 25-Hydroxy: 47.1 ng/mL (ref 30.0–100.0)

## 2024-02-26 LAB — VITAMIN B12: Vitamin B-12: 872 pg/mL (ref 232–1245)

## 2024-02-28 NOTE — Progress Notes (Signed)
 This encounter was conducted via the [employer] providing interactive audio and visual communication.  The patient provided verbal consent to conduct a virtual appointment.  The patient was located at their primary residence during this encounter.  Referring Physician(s): Corey,Evan S   Chief Complaint: The patient is seen in virtual video follow up today s/p left geniculate artery embolization 02/07/24  History of present illness: HPI from initial consultation 01/20/24 Tammy Hogan is a 48 y.o. female with a medical history significant for anxiety/depression, CHF, HTN, obesity, DM and chronic left knee pain secondary to osteoarthritis. She works with Sports Medicine for her knee pain and has tried a variety of treatment strategies including steroid injections and the Zilretta  injections which were ineffective. At her last visit with [name] she received a Monovisc injection. She is currently working with a Systems analyst for weight loss.   Her BMI is currently too high to consider a knee replacement. She has been kindly referred to Interventional Radiology by [name] for consideration of geniculate artery embolization.      [place] Pain Score = 62/96 VAS Pain Score = 7/10  We discussed the rationale, periprocedural expectations, and long term expected outcomes of geniculate artery embolization. She wished to proceed and on 02/07/24 she underwent a technically successful left GAE. She tolerated the procedure well and was discharged home the same day. She presents today for follow up via virtual video visit.  She reports an approximately 70% improvement in her knee pain. The average pain is now about a 4/10.  She does not take any medications for pain or discomfort.  She is able to exercise more than she used to.  She previously walked with a limp, which has now resolved.  She is very happy with the results.    Past Medical History:  Diagnosis Date   Anal fissure     Anemia    past hx    Anginal pain (HCC)    Anxiety    [place], [place]   Anxiety    Back pain    Bimalleolar fracture of left ankle 07/10/2013   CHF (congestive heart failure) (HCC)    systolic heart failure 2014   Constipation    Depression    Depression    GERD (gastroesophageal reflux disease)    Heartburn    High blood pressure    High cholesterol    History of cardiac cath 07/23/2012   Dr Dann   History of echocardiogram    Cardiomyopathy-reduced EF 35-40% with wall m otion abnormality concerning for ischemia. Hypokinesis of the anterior and anteroseptal walls to the apex   HPV (human papilloma virus) infection    Hyperlipidemia    Hypertension    Joint pain    Palpitations    PCOS (polycystic ovarian syndrome)    pt denies   PPD positive, treated 2008   [place], [place]    Past Surgical History:  Procedure Laterality Date   CARDIAC CATHETERIZATION  feb, 2014   Dr Dann   FOOT SURGERY  10/2019   IR EMBO ARTERIAL NOT HEMORR HEMANG INC GUIDE ROADMAPPING  02/07/2024   IR RADIOLOGIST EVAL & MGMT  01/20/2024   ORIF ANKLE FRACTURE Left 07/10/2013   Procedure: OPEN REDUCTION INTERNAL FIXATION (ORIF) LEFT BIMALLEOLAR ANKLE FRACTURE;  Surgeon: [name], MD;  Location: [facility];  Service: Orthopedics;  Laterality: Left;    Allergies: Patient has no known allergies.  Medications: Prior to Admission medications  Medication Sig Start Date End Date Taking? Authorizing Provider  amLODipine  (NORVASC ) 10 MG tablet TAKE 1 TABLET BY MOUTH DAILY 11/15/23   [name], MD  aspirin  81 MG tablet Take 81 mg by mouth daily.    [provider]  atorvastatin  (LIPITOR) 10 MG tablet Take 1 tablet (10 mg total) by mouth daily. 10/09/23   [name], MD  busPIRone  (BUSPAR ) 5 MG tablet TAKE 1 TABLET BY MOUTH DAILY AS NEEDED 10/21/23   [name], MD  carvedilol  (COREG  CR) 80 MG 24 hr capsule TAKE 1 CAPSULE BY MOUTH EVERY DAY 10/15/23   Ross, Paula V, MD   clotrimazole -betamethasone  (LOTRISONE ) cream Apply 1 Application topically daily. 07/05/23   [name], MD  docusate sodium  (COLACE) 100 MG capsule Take 1 capsule (100 mg total) by mouth daily as needed. 02/25/24   [name], MD  doxycycline  (VIBRA -TABS) 100 MG tablet Take 1 tablet (100 mg total) by mouth 2 (two) times daily. 10/04/23   [name], MD  escitalopram  (LEXAPRO ) 10 MG tablet Take 1 tablet (10 mg total) by mouth daily. 03/08/23   [name], MD  furosemide  (LASIX ) 20 MG tablet TAKE 1 TABLET BY MOUTH DAILY 12/26/23   [name], MD  gabapentin  (NEURONTIN ) 300 MG capsule TAKE 1 CAPSULE BY MOUTH AT BEDTIME 12/26/23   [name], MD  hydrOXYzine (ATARAX) 25 MG tablet Take 25 mg by mouth 3 (three) times daily.    [provider]  levonorgestrel (MIRENA) 20 MCG/24HR IUD 1 each by Intrauterine route once.    [provider]  lisinopril  (ZESTRIL ) 40 MG tablet Take 1 tablet (40 mg total) by mouth daily. 10/18/23   [name], MD  meloxicam  (MOBIC ) 15 MG tablet Take 1 tablet (15 mg total) by mouth daily. 12/06/23   Joane Artist RAMAN, MD  metFORMIN  (GLUCOPHAGE ) 500 MG tablet Take 1 tablet (500 mg total) by mouth daily. 02/25/24 03/26/24  [name], MD  methylPREDNISolone  (MEDROL  DOSEPAK) 4 MG TBPK tablet Pt to follow taper per pharmacy 02/07/24   Davontae Prusinski J, MD  minoxidil (LONITEN) 2.5 MG tablet Take by mouth. 01/15/22   [provider]  nitroGLYCERIN  (NITROSTAT ) 0.4 MG SL tablet Place 1 tablet (0.4 mg total) under the tongue every 5 (five) minutes as needed for chest pain. 09/18/22   [name], MD  nystatin  (MYCOSTATIN ) 100000 UNIT/ML suspension Take 5 mLs (500,000 Units total) by mouth 4 (four) times daily. 04/19/23   [name], MD  nystatin  cream (MYCOSTATIN ) Apply 1 Application topically 2 (two) times daily. 08/27/22   [provider]  ondansetron  (ZOFRAN ) 4 MG tablet Take 1 tablet (4 mg total) by mouth every 8  (eight) hours as needed. 10/04/23   [name], MD  pantoprazole  (PROTONIX ) 40 MG tablet TAKE 1 TABLET BY MOUTH DAILY 07/15/23   [name], MD  spironolactone  (ALDACTONE ) 25 MG tablet TAKE 1 TABLET BY MOUTH DAILY 09/27/23   [name], MD  tirzepatide  (MOUNJARO ) 5 MG/0.5ML Pen Inject 5 mg into the skin once a week. 02/25/24   [name], MD  Vitamin D , Ergocalciferol , (DRISDOL ) 1.25 MG (50000 UNIT) CAPS capsule Take 1 capsule (50,000 Units total) by mouth every 7 (seven) days. 01/01/24   [name], MD     Family History  Problem Relation Age of Onset   Alcohol abuse Mother    Anxiety disorder Mother    Depression  Mother    Hypertension Mother    Hyperlipidemia Mother    Colon polyps Mother    Other Father        drug overdose   Drug abuse Father        overdose   Hypertension Sister    Diabetes Maternal Uncle    Diabetes Maternal Grandmother    Cancer Maternal Grandmother    Heart failure Paternal Grandmother    Hypertension Paternal Grandmother    Diabetes Paternal Grandmother    Glaucoma Paternal Grandmother    Cirrhosis Paternal Grandfather    Alcohol abuse Paternal Grandfather    Cancer Other 77       breast, cervical and colon cancer   Colon cancer Neg Hx    Esophageal cancer Neg Hx    Rectal cancer Neg Hx    Stomach cancer Neg Hx     Social History   Socioeconomic History   Marital status: Married    Spouse name: Not on file   Number of children: 0   Years of education: Not on file   Highest education level: Bachelor's degree (e.g., BA, AB, BS)  Occupational History   Not on file  Tobacco Use   Smoking status: Former    Current packs/day: 0.00    Average packs/day: 0.5 packs/day for 16.0 years (8.0 ttl pk-yrs)    Types: Cigarettes    Start date: 05/16/2005    Quit date: 05/16/2021    Years since quitting: 2.7   Smokeless tobacco: Never  Vaping Use   Vaping status: Never Used  Substance and Sexual Activity   Alcohol use: Not  Currently    Comment: socially   Drug use: No   Sexual activity: Yes    Birth control/protection: I.U.D.  Other Topics Concern   Not on file  Social History Narrative   Not on file   Social Drivers of Health   Financial Resource Strain: Low Risk  (01/13/2024)   Overall Financial Resource Strain (CARDIA)    Difficulty of Paying Living Expenses: Not hard at all  Food Insecurity: No Food Insecurity (01/13/2024)   Hunger Vital Sign    Worried About Running Out of Food in the Last Year: Never true    Ran Out of Food in the Last Year: Never true  Transportation Needs: No Transportation Needs (01/13/2024)   PRAPARE - Administrator, Civil Service (Medical): No    Lack of Transportation (Non-Medical): No  Physical Activity: Sufficiently Active (01/13/2024)   Exercise Vital Sign    Days of Exercise per Week: 4 days    Minutes of Exercise per Session: 60 min  Stress: No Stress Concern Present (01/13/2024)   [employer] of Occupational Health - Occupational Stress Questionnaire    Feeling of Stress: Only a little  Social Connections: Moderately Isolated (01/13/2024)   Social Connection and Isolation Panel    Frequency of Communication with Friends and Family: Twice a week    Frequency of Social Gatherings with Friends and Family: Twice a week    Attends Religious Services: Never    Database administrator or Organizations: No    Attends Engineer, structural: Not on file    Marital Status: Married     Vital Signs: There were no vitals taken for this visit.  Physical Exam  Patient is alert, oriented and able to participate fully in the conversation. No apparent discomfort or distress observed. She appears appropriately dressed.    Imaging: Left knee  08/06/23    Left knee MRI 09/03/23   Left GAE  02/07/24           Labs:  CBC: Recent Labs    07/05/23 1427 07/18/23 1237 02/25/24 0824  WBC 10.0 8.9 7.0  HGB 11.9 11.0* 11.5  HCT 36.6 34.8 36.9   PLT 548* 451* 406    COAGS: No results for input(s): INR, APTT in the last 8760 hours.  BMP: Recent Labs    04/19/23 1552 07/18/23 1237 10/22/23 1255 02/25/24 0824  NA 134* 139 139 137  K 4.5 4.3 4.8 4.4  CL 98 101 100 99  CO2 26 24 22 22   GLUCOSE 99 172* 67* 94  BUN 15 11 16 11   CALCIUM  9.6 9.0 9.5 9.6  CREATININE 0.79 0.76 0.85 0.92    LIVER FUNCTION TESTS: Recent Labs    07/18/23 1237 02/25/24 0824  BILITOT 0.3 0.3  AST 15 11  ALT 18 14  ALKPHOS 124* 108  PROT 6.3 6.4  ALBUMIN 3.7* 3.8*    Assessment and Plan: 49 year old female with a history of chronic left knee pain (WOMAC 62/96) secondary to moderate osteoarthritis which has been refractory to conservative measures. She underwent a technically successful left geniculate artery embolization 02/07/24.  She reports an approximately 70% reduction in pain since the procedure and is please with the results.  Follow up in 6 months in IR clinic.  Ester Sides, MD Pager: [phone]    I spent a total of 25 Minutes in virtual video clinical consultation, greater than 50% of which was counseling/coordinating care for left knee pain.

## 2024-03-01 ENCOUNTER — Other Ambulatory Visit: Payer: Self-pay | Admitting: Family Medicine

## 2024-03-01 ENCOUNTER — Other Ambulatory Visit (INDEPENDENT_AMBULATORY_CARE_PROVIDER_SITE_OTHER): Payer: Self-pay | Admitting: Family Medicine

## 2024-03-01 DIAGNOSIS — E1169 Type 2 diabetes mellitus with other specified complication: Secondary | ICD-10-CM

## 2024-03-01 DIAGNOSIS — F411 Generalized anxiety disorder: Secondary | ICD-10-CM

## 2024-03-02 ENCOUNTER — Ambulatory Visit
Admission: RE | Admit: 2024-03-02 | Discharge: 2024-03-02 | Disposition: A | Discharge status: Home / Self Care | Source: Ambulatory Visit | Attending: Interventional Radiology | Admitting: Interventional Radiology

## 2024-03-02 DIAGNOSIS — M25562 Pain in left knee: Secondary | ICD-10-CM

## 2024-03-02 HISTORY — PX: IR RADIOLOGIST EVAL & MGMT: IMG5224

## 2024-03-04 ENCOUNTER — Other Ambulatory Visit (INDEPENDENT_AMBULATORY_CARE_PROVIDER_SITE_OTHER): Payer: Self-pay | Admitting: Family Medicine

## 2024-03-04 DIAGNOSIS — E1169 Type 2 diabetes mellitus with other specified complication: Secondary | ICD-10-CM

## 2024-03-05 ENCOUNTER — Ambulatory Visit (INDEPENDENT_AMBULATORY_CARE_PROVIDER_SITE_OTHER): Admitting: Psychology

## 2024-03-05 DIAGNOSIS — F331 Major depressive disorder, recurrent, moderate: Secondary | ICD-10-CM

## 2024-03-05 NOTE — Progress Notes (Signed)
 [facility] Counselor/Therapist Progress Note  Patient ID: Tammy Hogan, MRN: [id number],    Date: 03/05/2024  Time Spent: 2:00pm-2:45pm   45 minutes   Treatment Type: Individual Therapy  Reported Symptoms: stress  Mental Status Exam: Appearance:  Casual     Behavior: Appropriate  Motor: Normal  Speech/Language:  Normal Rate  Affect: Appropriate  Mood: normal  Thought process: normal  Thought content:   WNL  Sensory/Perceptual disturbances:   WNL  Orientation: oriented to person, place, time/date, and situation  Attention: Good  Concentration: Good  Memory: WNL  Fund of knowledge:  Good  Insight:   Good  Judgment:  Good  Impulse Control: Good   Risk Assessment: Danger to Self:  No Self-injurious Behavior: No Danger to Others: No Duty to Warn:no Physical Aggression / Violence:No  Access to Firearms a concern: No  Gang Involvement:No   Subjective: Pt present for face-to-face individual therapy via video.  Pt consents to telehealth video session and is aware of limitations and benefits of virtual sessions. Location of pt: home Location of therapist: home office.   Pt talked about her job.  She is frustrated with her supervisor and feels she is not treated like the other employees.   Pt has still not been allowed to work virtually.  Pt's supervisor has started to require pt to give her doctor notes for appointments during work hours.   Pt feels like she is not trusted. Pt tends to be late to work and her boss is taking issue with that.   Pt states she gets her work done so she does not like being given a hard time about being late.   Pt is not sure shy some people don't like her.   She states she thinks her confidence and directness can be offputting to people.   Pt does not want to temper her personality but does not want to get fired either.   Pt's probationary period is for a year.  Addressed how pt can effectively handle work issues. Pt has started  the renovation work on her house and has been upset that the contractor has not followed her instructions about her dog and her dog got outside.   Addressed how pt can communicate effectively to be sure her dog is safe.  Worked on self care strategies.   Provided supportive therapy.    Interventions: Cognitive Behavioral Therapy and Insight-Oriented  Diagnosis: F33.1  Plan of Care: Recommend ongoing therapy.   Pt participated in setting treatment goals.    Pt states she wants to find some peace.   Pt wants to improve coping skills.   Plan to meet every two weeks.   Pt agrees with treatment plan.   Treatment Plan Client Abilities/Strengths  Pt is bright, engaging, and motivated for therapy.   Client Treatment Preferences  Individual therapy.  Client Statement of Needs  Improve coping skills.  Symptoms  Depressed or irritable mood. Sleeplessness or hypersomnia. Poor concentration and indecisiveness. Low self-esteem.  Problems Addressed  Unipolar Depression Goals 1. Alleviate depressive symptoms and return to previous level of effective functioning. 2. Appropriately grieve the loss in order to normalize mood and to return to previously adaptive level of functioning. Objective Learn and implement behavioral strategies to overcome depression. Target Date: 2024-04-08 Frequency: Biweekly  Progress: 10 Modality: individual  Related Interventions Engage the client in behavioral activation, increasing his/her activity level and contact with sources of reward, while identifying processes that inhibit activation.  Use behavioral techniques  such as instruction, rehearsal, role-playing, role reversal, as needed, to facilitate activity in the client's daily life; reinforce success. Assist the client in developing skills that increase the likelihood of deriving pleasure from behavioral activation (e.g., assertiveness skills, developing an exercise plan, less internal/more external focus,  increased social involvement); reinforce success. Objective Identify important people in life, past and present, and describe the quality, good and poor, of those relationships. Target Date: 2024-04-08 Frequency: Biweekly  Progress: 10 Modality: individual  Related Interventions Conduct Interpersonal Therapy beginning with the assessment of the client's interpersonal inventory of important past and present relationships; develop a case formulation linking depression to grief, interpersonal role disputes, role transitions, and/or interpersonal deficits). Objective Learn and implement problem-solving and decision-making skills. Target Date: 2024-04-08 Frequency: Biweekly  Progress: 10 Modality: individual  Related Interventions Conduct Problem-Solving Therapy using techniques such as psychoeducation, modeling, and role-playing to teach client problem-solving skills (i.e., defining a problem specifically, generating possible solutions, evaluating the pros and cons of each solution, selecting and implementing a plan of action, evaluating the efficacy of the plan, accepting or revising the plan); role-play application of the problem-solving skill to a real life issue. Encourage in the client the development of a positive problem orientation in which problems and solving them are viewed as a natural part of life and not something to be feared, despaired, or avoided. 3. Develop healthy interpersonal relationships that lead to the alleviation and help prevent the relapse of depression. 4. Develop healthy thinking patterns and beliefs about self, others, and the world that lead to the alleviation and help prevent the relapse of depression. 5. Recognize, accept, and cope with feelings of depression. Diagnosis F33.1  Conditions For Discharge Achievement of treatment goals and objectives   Veva Alma, LCSW

## 2024-03-06 ENCOUNTER — Telehealth

## 2024-03-23 ENCOUNTER — Other Ambulatory Visit: Payer: Self-pay | Admitting: Family Medicine

## 2024-03-23 DIAGNOSIS — E559 Vitamin D deficiency, unspecified: Secondary | ICD-10-CM

## 2024-03-24 ENCOUNTER — Encounter (INDEPENDENT_AMBULATORY_CARE_PROVIDER_SITE_OTHER): Payer: Self-pay | Admitting: Family Medicine

## 2024-03-24 ENCOUNTER — Ambulatory Visit (INDEPENDENT_AMBULATORY_CARE_PROVIDER_SITE_OTHER): Admitting: Family Medicine

## 2024-03-24 VITALS — BP 136/81 | HR 94 | Temp 97.8°F | Ht 63.0 in | Wt 244.0 lb

## 2024-03-24 DIAGNOSIS — E669 Obesity, unspecified: Secondary | ICD-10-CM | POA: Diagnosis not present

## 2024-03-24 DIAGNOSIS — Z6841 Body Mass Index (BMI) 40.0 and over, adult: Secondary | ICD-10-CM

## 2024-03-24 DIAGNOSIS — Z7985 Long-term (current) use of injectable non-insulin antidiabetic drugs: Secondary | ICD-10-CM | POA: Diagnosis not present

## 2024-03-24 DIAGNOSIS — E1169 Type 2 diabetes mellitus with other specified complication: Secondary | ICD-10-CM | POA: Diagnosis not present

## 2024-03-24 DIAGNOSIS — Z7984 Long term (current) use of oral hypoglycemic drugs: Secondary | ICD-10-CM

## 2024-03-24 DIAGNOSIS — E66813 Obesity, class 3: Secondary | ICD-10-CM

## 2024-03-24 MED ORDER — METFORMIN HCL 500 MG PO TABS
500.0000 mg | ORAL_TABLET | Freq: Every day | ORAL | 0 refills | Status: DC
Start: 2024-03-24 — End: 2024-04-21

## 2024-03-24 MED ORDER — TIRZEPATIDE 5 MG/0.5ML ~~LOC~~ SOAJ: 5.0000 mg | SUBCUTANEOUS | 0 refills | Status: DC

## 2024-03-24 NOTE — Progress Notes (Signed)
 Office: [phone]  /  Fax: [phone]  WEIGHT SUMMARY AND BIOMETRICS  Anthropometric Measurements Height: 5' 3 (1.6 m) Weight: 244 lb (110.7 kg) BMI (Calculated): 43.23 Weight at Last Visit: 247lb Weight Lost Since Last Visit: 3lb Weight Gained Since Last Visit: 0lb Starting Weight: 269lb Total Weight Loss (lbs): 25 lb (11.3 kg) Peak Weight: 275lb   Body Composition  Body Fat %: 48.3 % Fat Mass (lbs): 118 lbs Muscle Mass (lbs): 120 lbs Total Body Water (lbs): 90.4 lbs Visceral Fat Rating : 15   Other Clinical Data RMR: [id number] Fasting: No Labs: no Today's Visit #: 13 Starting Date: 07/18/23    Chief Complaint: OBESITY    History of Present Illness Tammy Hogan is a 48 year old female with obesity and type two diabetes who presents for obesity treatment plan assessment and progress evaluation.  She follows the category two eating plan approximately 30% of the time and struggles to meet her recommended protein intake. She has increased her consumption of fruits and vegetables and is trying to improve her hydration. She often skips meals and does not consistently get seven or more hours of sleep per night. She exercises by doing cardio for 60 minutes three times a week and has lost three pounds since her last visit.  In managing type two diabetes, she primarily uses lifestyle modifications through diet and exercise. Her current medications include metformin  500 mg per day and Mounjaro  5 mg per week, with no reported issues. She reports feeling hungry only when she has not eaten for a while, rather than experiencing constant cravings.  She engages in strength training with a trainer and does cardio, although she dislikes it. She does 30 minutes of cardio weekly and focuses on strength training. She has arthritis in her knee and lower back problems, which she factors into her exercise routine. She can do a plank for 60 seconds and feels stronger with more  energy, noting changes in her body composition.      PHYSICAL EXAM:  Blood pressure 136/81, pulse 94, temperature 97.8 F (36.6 C), height 5' 3 (1.6 m), weight 244 lb (110.7 kg), SpO2 100%. Body mass index is 43.22 kg/m.  DIAGNOSTIC DATA REVIEWED:  BMET    Component Value Date/Time   NA 137 02/25/2024 0824   K 4.4 02/25/2024 0824   CL 99 02/25/2024 0824   CO2 22 02/25/2024 0824   GLUCOSE 94 02/25/2024 0824   GLUCOSE 99 04/19/2023 1552   BUN 11 02/25/2024 0824   CREATININE 0.92 02/25/2024 0824   CREATININE 0.79 04/19/2023 1552   CALCIUM  9.6 02/25/2024 0824   GFRNONAA 89 08/05/2019 1705   GFRAA 102 08/05/2019 1705   Lab Results  Component Value Date   HGBA1C 5.5 02/25/2024   HGBA1C 5.4 07/03/2012   Lab Results  Component Value Date   INSULIN  33.2 (H) 02/25/2024   INSULIN  55.6 (H) 07/18/2023   Lab Results  Component Value Date   TSH 1.740 07/18/2023   CBC    Component Value Date/Time   WBC 7.0 02/25/2024 0824   WBC 12.9 (H) 11/20/2022 0844   RBC 4.14 02/25/2024 0824   RBC 3.93 11/20/2022 0844   HGB 11.5 02/25/2024 0824   HCT 36.9 02/25/2024 0824   PLT 406 02/25/2024 0824   MCV 89 02/25/2024 0824   MCH 27.8 02/25/2024 0824   MCH 29.1 07/10/2013 1528   MCHC 31.2 (L) 02/25/2024 0824   MCHC 31.6 11/20/2022 0844   RDW 13.2 02/25/2024 0824  Iron Studies    Component Value Date/Time   IRON 51 07/05/2023 1427   TIBC 344 07/05/2023 1427   FERRITIN 104 07/05/2023 1427   IRONPCTSAT 15 07/05/2023 1427   Lipid Panel     Component Value Date/Time   CHOL 163 02/25/2024 0824   TRIG 152 (H) 02/25/2024 0824   HDL 33 (L) 02/25/2024 0824   CHOLHDL 3.9 09/26/2022 1133   CHOLHDL 5 05/10/2021 0813   VLDL 30.4 05/10/2021 0813   LDLCALC 103 (H) 02/25/2024 0824   Hepatic Function Panel     Component Value Date/Time   PROT 6.4 02/25/2024 0824   ALBUMIN 3.8 (L) 02/25/2024 0824   AST 11 02/25/2024 0824   ALT 14 02/25/2024 0824   ALKPHOS 108 02/25/2024 0824    BILITOT 0.3 02/25/2024 0824   BILIDIR 0.1 11/20/2022 0844      Component Value Date/Time   TSH 1.740 07/18/2023 1237   Nutritional Lab Results  Component Value Date   VD25OH 47.1 02/25/2024   VD25OH 19.1 (L) 07/18/2023   VD25OH 35.51 05/10/2021     Assessment and Plan Assessment & Plan Obesity Obesity management is ongoing with a focus on lifestyle modifications. She follows the category two eating plan about 30% of the time, with challenges in protein intake but improvements in fruit and vegetable consumption. Hydration is a work in progress. She has lost 3 pounds since the last visit, indicating progress. Exercise includes strength training and cardio, performed as tolerated due to arthritis in her knee and lower back issues. She reports increased strength and energy, despite not enjoying exercise. Sleep is inadequate, with less than 7 hours per night, potentially impacting weight loss efforts. - Continue category two eating plan with emphasis on increasing protein intake. - Encourage hydration and consumption of fruits and vegetables. - Continue current exercise regimen with strength training and cardio as tolerated. - Emphasize the importance of getting at least 7 hours of sleep per night to aid weight loss.  Type 2 diabetes mellitus Type 2 diabetes is managed with lifestyle modifications and medication. She is on metformin  500 mg per day and Mounjaro  5 mg per week, with no issues reported. She experiences appropriate hunger levels, indicating good diabetes management. She opts for healthier sweet options like frozen fruits instead of candy. - Continue metformin  500 mg daily. - Continue Mounjaro  5 mg weekly. - Refill Mounjaro  prescription to ensure continuity of medication.     Edris was informed of the importance of frequent follow up visits to maximize her success with intensive lifestyle modifications for her obesity and obesity related health conditions as recommended by  USPSTF and CMS guidelines   Louann Penton, MD

## 2024-03-26 ENCOUNTER — Telehealth (INDEPENDENT_AMBULATORY_CARE_PROVIDER_SITE_OTHER): Payer: Self-pay

## 2024-03-26 NOTE — Telephone Encounter (Signed)
 Wait for Questions [employer] typically responds with questions in less than 15 minutes, but may take up to 24 hours.

## 2024-03-30 ENCOUNTER — Other Ambulatory Visit (INDEPENDENT_AMBULATORY_CARE_PROVIDER_SITE_OTHER): Payer: Self-pay | Admitting: Family Medicine

## 2024-03-30 DIAGNOSIS — E1169 Type 2 diabetes mellitus with other specified complication: Secondary | ICD-10-CM

## 2024-03-31 NOTE — Telephone Encounter (Signed)
.   Mounjaro  approved:  Authorization Expiration Date: October 02, 2026.

## 2024-04-07 ENCOUNTER — Ambulatory Visit: Admitting: Psychology

## 2024-04-21 ENCOUNTER — Ambulatory Visit (INDEPENDENT_AMBULATORY_CARE_PROVIDER_SITE_OTHER): Payer: Self-pay | Admitting: Family Medicine

## 2024-04-21 ENCOUNTER — Encounter (INDEPENDENT_AMBULATORY_CARE_PROVIDER_SITE_OTHER): Payer: Self-pay | Admitting: Family Medicine

## 2024-04-21 VITALS — BP 122/79 | HR 86 | Temp 98.3°F | Ht 63.0 in | Wt 240.0 lb

## 2024-04-21 DIAGNOSIS — E119 Type 2 diabetes mellitus without complications: Secondary | ICD-10-CM

## 2024-04-21 DIAGNOSIS — K5901 Slow transit constipation: Secondary | ICD-10-CM

## 2024-04-21 DIAGNOSIS — Z6841 Body Mass Index (BMI) 40.0 and over, adult: Secondary | ICD-10-CM

## 2024-04-21 DIAGNOSIS — E669 Obesity, unspecified: Secondary | ICD-10-CM

## 2024-04-21 DIAGNOSIS — Z7984 Long term (current) use of oral hypoglycemic drugs: Secondary | ICD-10-CM

## 2024-04-21 DIAGNOSIS — E1169 Type 2 diabetes mellitus with other specified complication: Secondary | ICD-10-CM

## 2024-04-21 MED ORDER — METFORMIN HCL 500 MG PO TABS: 500.0000 mg | ORAL_TABLET | Freq: Every day | ORAL | 0 refills | Status: DC

## 2024-04-21 MED ORDER — TIRZEPATIDE 7.5 MG/0.5ML ~~LOC~~ SOAJ: 7.5000 mg | SUBCUTANEOUS | 0 refills | Status: DC

## 2024-04-21 NOTE — Progress Notes (Signed)
 Office: [phone]  /  Fax: [phone]  WEIGHT SUMMARY AND BIOMETRICS  Anthropometric Measurements Height: 5' 3 (1.6 m) Weight: 240 lb (108.9 kg) BMI (Calculated): 42.52 Weight at Last Visit: 244 lb Weight Lost Since Last Visit: 4 lb Weight Gained Since Last Visit: 0 Starting Weight: 269 lb Total Weight Loss (lbs): 29 lb (13.2 kg) Peak Weight: 275 lb   Body Composition  Body Fat %: 45.4 % Fat Mass (lbs): 109 lbs Muscle Mass (lbs): 124.4 lbs Total Body Water (lbs): 88 lbs Visceral Fat Rating : 14   Other Clinical Data RMR: 1570 Fasting: yes Labs: no Today's Visit #: 14 Starting Date: 07/18/23    Chief Complaint: OBESITY    History of Present Illness Tammy Hogan is a 48 year old female with obesity and type 2 diabetes who presents for obesity treatment and progress assessment.  She has been adhering to the category two eating plan approximately sixty percent of the time and struggles to meet the recommended protein intake. She consumes more fruits and vegetables and maintains adequate hydration. She attempts to avoid skipping meals but does not consistently achieve seven to nine hours of sleep per night. She engages in exercise with free weights three days a week and has lost four pounds in the last month since her last visit.  She experiences a constant preoccupation with food, with cravings for items like bacon and candy. She sometimes uses [employer] to browse food options, which she finds challenging to resist. She acknowledges thinking about food frequently and struggles to refrain from indulging, often consuming items like a pack of bacon over two days.  Her current medications include metformin  500 mg daily for type 2 diabetes. She also takes Colace and Fiber Well as needed to maintain regularity, which she reports is effective along with adequate water intake.  Recent lab results show a hemoglobin A1c of 5.5. Her vitamin D  levels have improved  compared to prior results. Her LDL cholesterol is 103 mg/dL, and her recent labs included kidney and liver function tests.  She is undergoing home renovations, which she finds less stressful than her husband. She plans to have a simple Thanksgiving with her husband, focusing on portion control and avoiding large quantities of food. She and her husband support each other in their weight loss journey, with her husband also losing weight. She maintains accountability to her husband, and he assists by preparing raw vegetables for her meals.      PHYSICAL EXAM:  Blood pressure 122/79, pulse 86, temperature 98.3 F (36.8 C), height 5' 3 (1.6 m), weight 240 lb (108.9 kg), SpO2 100%. Body mass index is 42.51 kg/m.  DIAGNOSTIC DATA REVIEWED:  BMET    Component Value Date/Time   NA 137 02/25/2024 0824   K 4.4 02/25/2024 0824   CL 99 02/25/2024 0824   CO2 22 02/25/2024 0824   GLUCOSE 94 02/25/2024 0824   GLUCOSE 99 04/19/2023 1552   BUN 11 02/25/2024 0824   CREATININE 0.92 02/25/2024 0824   CREATININE 0.79 04/19/2023 1552   CALCIUM  9.6 02/25/2024 0824   GFRNONAA 89 08/05/2019 1705   GFRAA 102 08/05/2019 1705   Lab Results  Component Value Date   HGBA1C 5.5 02/25/2024   HGBA1C 5.4 07/03/2012   Lab Results  Component Value Date   INSULIN  33.2 (H) 02/25/2024   INSULIN  55.6 (H) 07/18/2023   Lab Results  Component Value Date   TSH 1.740 07/18/2023   CBC    Component Value Date/Time  WBC 7.0 02/25/2024 0824   WBC 12.9 (H) 11/20/2022 0844   RBC 4.14 02/25/2024 0824   RBC 3.93 11/20/2022 0844   HGB 11.5 02/25/2024 0824   HCT 36.9 02/25/2024 0824   PLT 406 02/25/2024 0824   MCV 89 02/25/2024 0824   MCH 27.8 02/25/2024 0824   MCH 29.1 07/10/2013 1528   MCHC 31.2 (L) 02/25/2024 0824   MCHC 31.6 11/20/2022 0844   RDW 13.2 02/25/2024 0824   Iron Studies    Component Value Date/Time   IRON 51 07/05/2023 1427   TIBC 344 07/05/2023 1427   FERRITIN 104 07/05/2023 1427    IRONPCTSAT 15 07/05/2023 1427   Lipid Panel     Component Value Date/Time   CHOL 163 02/25/2024 0824   TRIG 152 (H) 02/25/2024 0824   HDL 33 (L) 02/25/2024 0824   CHOLHDL 3.9 09/26/2022 1133   CHOLHDL 5 05/10/2021 0813   VLDL 30.4 05/10/2021 0813   LDLCALC 103 (H) 02/25/2024 0824   Hepatic Function Panel     Component Value Date/Time   PROT 6.4 02/25/2024 0824   ALBUMIN 3.8 (L) 02/25/2024 0824   AST 11 02/25/2024 0824   ALT 14 02/25/2024 0824   ALKPHOS 108 02/25/2024 0824   BILITOT 0.3 02/25/2024 0824   BILIDIR 0.1 11/20/2022 0844      Component Value Date/Time   TSH 1.740 07/18/2023 1237   Nutritional Lab Results  Component Value Date   VD25OH 47.1 02/25/2024   VD25OH 19.1 (L) 07/18/2023   VD25OH 35.51 05/10/2021     Assessment and Plan Assessment & Plan Obesity Management is ongoing with a focus on dietary adherence and exercise. She is following the category two eating plan approximately 60% of the time, struggling with protein intake, and experiencing neurologic hunger likely due to stress from home improvements. She has lost four pounds in the last month. Mounjaro  dosage is being increased to address hunger and cravings. - Increased Mounjaro  dosage from 5 mg to 7.5 mg. - Continue category two eating plan with emphasis on protein and vegetable intake. - Encouraged regular exercise with free weights three days a week. - Scheduled follow-up appointment in December with PA and in January with the provider.  Type 2 diabetes mellitus Managed with metformin  500 mg daily. Recent hemoglobin A1c is 5.5, indicating good glycemic control. Blood sugars are improving, and other lab results show positive trends. - Continue metformin  500 mg daily. - Continue diet, exercise and weight loss as discussed today as an important part of the treatment plan   Slow transit constipation Managed with Colace and dietary modifications. She reports adequate regularity with current  regimen, including water and fiber intake. - Continue current regimen of Colace and fiber intake as needed.   [name] was counseled on the importance of maintaining healthy lifestyle habits, including balanced nutrition, regular physical activity, and behavioral modifications, while taking antiobesity medication.  Patient verbalized understanding that medication is an adjunct to, not a replacement for, lifestyle changes and that the long-term success and weight maintenance depend on continued adherence to these strategies.   Caylor was informed of the importance of frequent follow up visits to maximize her success with intensive lifestyle modifications for her obesity and obesity related health conditions as recommended by USPSTF and CMS guidelines   Louann Penton, MD

## 2024-04-23 ENCOUNTER — Other Ambulatory Visit (INDEPENDENT_AMBULATORY_CARE_PROVIDER_SITE_OTHER): Payer: Self-pay | Admitting: Family Medicine

## 2024-04-23 DIAGNOSIS — E1169 Type 2 diabetes mellitus with other specified complication: Secondary | ICD-10-CM

## 2024-04-28 ENCOUNTER — Other Ambulatory Visit: Payer: Self-pay

## 2024-04-28 ENCOUNTER — Telehealth: Payer: Self-pay

## 2024-04-28 ENCOUNTER — Encounter: Payer: Self-pay | Admitting: Family Medicine

## 2024-04-28 ENCOUNTER — Ambulatory Visit: Admitting: Family Medicine

## 2024-04-28 VITALS — BP 124/82 | HR 81 | Ht 63.0 in | Wt 246.0 lb

## 2024-04-28 DIAGNOSIS — M7061 Trochanteric bursitis, right hip: Secondary | ICD-10-CM | POA: Diagnosis not present

## 2024-04-28 DIAGNOSIS — M25522 Pain in left elbow: Secondary | ICD-10-CM | POA: Diagnosis not present

## 2024-04-28 DIAGNOSIS — M7712 Lateral epicondylitis, left elbow: Secondary | ICD-10-CM

## 2024-04-28 NOTE — Telephone Encounter (Signed)
 PA submitted through Cover My Meds. Awaiting insurance determination. Key: [id number]

## 2024-04-28 NOTE — Telephone Encounter (Signed)
Your PA has been resolved, no additional PA is required. For further inquiries please contact the number on the back of the member prescription card.

## 2024-04-28 NOTE — Patient Instructions (Addendum)
 Thank you for coming in today.   You received an injection today. Seek immediate medical attention if the joint becomes red, extremely painful, or is oozing fluid.   Please work on the home exercises the athletic trainer went over with you:  View at [email or web address] code [id number]  See you back as needed.

## 2024-04-28 NOTE — Progress Notes (Signed)
 I, Tammy Hogan, CMA acting as a scribe for Tammy Lloyd, MD.  Tammy Hogan is a 48 y.o. female who presents to [facility] at [facility] today for L elbow and R hip pain. Pt was last seen for her elbow on 04/10/23 and was given a L lateral epicondyle steroid injection and advised to use Voltaren  gel and a counterforce strap.  Today, pt reports exacerbation of left elbow pain over the past couple of weeks after doing weight-lifting. Denies visible swelling. Burning sensation at times. Some pain into lower arms. N/T present. Denies weakness or decreased grip strength.   Also c/o right hip pain over the past couple of weeks. Locates pain to lateral aspect of the hip. C/O chronic lower back pain. Some pain in the gluteal region but denies groin pain and radiating pain into the legs. Denies n/t/w in the R LE.   Has tried Voltaren  Gel with minimal relief. Has also tried Meloxicam  with minimal relief. Short-term relief with heat.   [name] has been going to the gym and working with a systems analyst since her last visit.  Additionally she has had a genicular artery embolization for her left knee.  She has lost over 20 pounds and overall is feeling much better and much stronger.  Pertinent review of systems: No fevers or chills  Relevant historical information: Heart disease diabetes obesity   Exam:  BP 124/82   Pulse 81   Ht 5' 3 (1.6 m)   Wt 246 lb (111.6 kg)   SpO2 99%   BMI 43.58 kg/m  General: Well Developed, well nourished, and in no acute distress.   MSK: Left elbow normal appearing Tender palpation lateral epicondyle.  Pain with resisted wrist extension. Intact strength.  Right hip normal-appearing Tender palpation lateral hip.    Lab and Radiology Results  Procedure: Real-time Ultrasound Guided Injection of left elbow lateral epicondyle common extensor tendon origin Device: Philips Affiniti 50G/GE Logiq Images permanently stored and  available for review in PACS Verbal informed consent obtained.  Discussed risks and benefits of procedure. Warned about infection, bleeding, hyperglycemia damage to structures among others. Patient expresses understanding and agreement Time-out conducted.   Noted no overlying erythema, induration, or other signs of local infection.   Skin prepped in a sterile fashion.   Local anesthesia: Topical Ethyl chloride.   With sterile technique and under real time ultrasound guidance: 40 mg of Kenalog  and 1 mL of Marcaine  injected into lateral epicondyle and common extensor tendon origin. Fluid seen entering the lateral epicondyle.   Completed without difficulty   Pain immediately resolved suggesting accurate placement of the medication.   Advised to call if fevers/chills, erythema, induration, drainage, or persistent bleeding.   Images permanently stored and available for review in the ultrasound unit.  Impression: Technically successful ultrasound guided injection.       Assessment and Plan: 48 y.o. 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  Plan for elbow injection and continue home exercise program.  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  Plan for home exercise program.  Backup plan for injection in the future.  Continue to work with systems analyst and home exercise program.  Check back as needed.   PDMP not reviewed this encounter. Orders Placed This Encounter  Procedures   US  LIMITED JOINT SPACE STRUCTURES UP LEFT(NO LINKED CHARGES)    Reason for Exam (SYMPTOM  OR DIAGNOSIS REQUIRED):   left elbow pain  Preferred imaging location?:   [facility]   No orders of the defined types were placed in this encounter.    Discussed warning signs or symptoms. Please see discharge instructions. Patient expresses understanding.   The above documentation has been reviewed and is accurate and complete Tammy Hogan, M.D.

## 2024-05-05 ENCOUNTER — Ambulatory Visit (INDEPENDENT_AMBULATORY_CARE_PROVIDER_SITE_OTHER): Admitting: Psychology

## 2024-05-05 DIAGNOSIS — F331 Major depressive disorder, recurrent, moderate: Secondary | ICD-10-CM
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
## 2024-05-18 ENCOUNTER — Ambulatory Visit (INDEPENDENT_AMBULATORY_CARE_PROVIDER_SITE_OTHER): Admitting: Physician Assistant

## 2024-05-18 ENCOUNTER — Encounter (INDEPENDENT_AMBULATORY_CARE_PROVIDER_SITE_OTHER): Payer: Self-pay | Admitting: Physician Assistant

## 2024-05-18 VITALS — BP 121/76 | HR 66 | Temp 98.6°F | Ht 63.0 in | Wt 237.0 lb

## 2024-05-18 DIAGNOSIS — Z6841 Body Mass Index (BMI) 40.0 and over, adult: Secondary | ICD-10-CM

## 2024-05-18 DIAGNOSIS — E785 Hyperlipidemia, unspecified: Secondary | ICD-10-CM

## 2024-05-18 DIAGNOSIS — E559 Vitamin D deficiency, unspecified: Secondary | ICD-10-CM

## 2024-05-18 DIAGNOSIS — E1159 Type 2 diabetes mellitus with other circulatory complications: Secondary | ICD-10-CM

## 2024-05-18 DIAGNOSIS — E66813 Obesity, class 3: Secondary | ICD-10-CM

## 2024-05-18 DIAGNOSIS — I152 Hypertension secondary to endocrine disorders: Secondary | ICD-10-CM

## 2024-05-18 DIAGNOSIS — Z7984 Long term (current) use of oral hypoglycemic drugs: Secondary | ICD-10-CM

## 2024-05-18 DIAGNOSIS — E1169 Type 2 diabetes mellitus with other specified complication: Secondary | ICD-10-CM

## 2024-05-18 DIAGNOSIS — K5901 Slow transit constipation: Secondary | ICD-10-CM

## 2024-05-18 MED ORDER — METFORMIN HCL 500 MG PO TABS: 500.0000 mg | ORAL_TABLET | Freq: Every day | ORAL | 0 refills | Status: AC

## 2024-05-18 NOTE — Progress Notes (Signed)
 SUBJECTIVE: Discussed the use of AI scribe software for clinical note transcription with the patient, who gave verbal consent to proceed.  Chief Complaint: Obesity  Interim History: She is down 3 lbs since her last visit Down 32 lbs overall TBW loss of 11.9%  Tammy Hogan is here to discuss her progress with her obesity treatment plan. She is on the Category 2 Plan and states she is following her eating plan approximately 50 % of the time. She states she is exercising free weights /cardio 60 minutes 3 times per week.  Tammy Hogan is a 48 year old female with obesity, type 2 diabetes, hypertension, and hyperlipidemia who presents for follow-up on her obesity treatment plan.  She has achieved a total weight loss of 32 pounds, with a 3-pound loss since her last visit. She follows a category two obesity treatment plan, adhering to it approximately 50% of the time. Her diet includes whole foods, but she struggles with getting adequate protein intake and adequate hydration.  She exercises with free weights and cardio for 60 minutes three days per week.  She experiences significant preoccupation with food and cravings, particularly for candy and fried foods. Dr. Verdon recently increased her Mounjaro  dose to 7.5 mg weekly to help address hunger and cravings. She is concerned about the potential impact of increased Mounjaro  on her appetite, as she already struggles to consume enough calories and protein.  Her antihypertensive regimen includes amlodipine  10 mg daily, carvedilol  80 mg daily, lisinopril  40 mg daily, Lasix  20 mg daily, and spironolactone  25 mg daily. She is also on Lipitor 10 mg daily for hyperlipidemia management.  Her typical diet includes breakfast-frozen strawberries or mangoes, protein shakes vs. yogurt with 15 grams of protein vs turkey sausage, sliced ham with cheese sticks, and coffee for breakfast. Lunch and dinner often consist of pre-cooked meats like pan-seared pork  chops with tomato cucumber salad.   She feels stronger and healthier, with improved body composition overall. We noted a decrease in body adipose percentage from 54% to 44.3% since starting with [facility]. She experiences less shortness of breath and finds walking her dog less of a chore. She has gained 1.2 pounds in muscle mass. OBJECTIVE: Visit Diagnoses: Problem List Items Addressed This Visit     Vitamin D  deficiency   Diabetes mellitus (HCC) - Primary   Relevant Medications   metFORMIN  (GLUCOPHAGE ) 500 MG tablet   Slow transit constipation   Obesity, unspecified   Relevant Medications   metFORMIN  (GLUCOPHAGE ) 500 MG tablet   BMI 40.0-44.9, adult (HCC)   Relevant Medications   metFORMIN  (GLUCOPHAGE ) 500 MG tablet   Other Visit Diagnoses       Hypertension associated with type 2 diabetes mellitus (HCC)       Relevant Medications   metFORMIN  (GLUCOPHAGE ) 500 MG tablet     Hyperlipidemia associated with type 2 diabetes mellitus (HCC)       Relevant Medications   metFORMIN  (GLUCOPHAGE ) 500 MG tablet      Class 3 obesity with serious comorbidity Class 3 obesity with significant weight loss of 32 pounds. Current weight management includes Mounjaro  7.5 mg weekly ( just started 7.5 mg this week) , increased from 5 mg to address hunger and cravings.  Reports significant preoccupation with food and cravings, particularly for sweets and fried foods.  Concerns about potential decrease in appetite affecting protein intake as she is already struggling to get in adequate protein. Engaging in regular exercise with free weights and  cardio, resulting in increased muscle mass.  Body composition improved with a reduction in body adipose percentage from ~54% to 44.3%. - Continue Mounjaro  7.5 mg weekly and monitor response to cravings. - Monitor protein intake closely and discussed focusing on getting protein in if not feeling hungry at meal times.  - Encouraged continued  exercise regimen with free weights and cardio. - Scheduled follow-up appointment with Doctor Verdon.  Type 2 diabetes mellitus  Type 2 diabetes managed with Mounjaro  7.5 mg weekly and metformin  500 mg daily. . No current blood sugar monitoring due to low risk of hypoglycemia with current medication regimen. Reports no significant changes in hunger or cravings since increasing Mounjaro  dosage to 7.5 mg weekly- but just started this week. On ACE On statin Lab Results  Component Value Date   HGBA1C 5.5 02/25/2024   HGBA1C 6.2 (H) 10/04/2023   HGBA1C 6.5 (H) 07/18/2023   Lab Results  Component Value Date   LDLCALC 103 (H) 02/25/2024   CREATININE 0.92 02/25/2024   She is working  on nutrition plan to decrease simple carbohydrates, increase lean proteins and exercise to promote weight loss and improve glycemic control . - Continue Mounjaro  7.5 mg weekly. - Continue metformin  500 mg daily. Refill for metformin  sent as may need prior to next visit.  She may also run out of Mounjaro  prior to next scheduled visit and can refill in the interim if she requests, but she is concerned about how she will do on the increased dose, so will hold off refill for now. May need to decrease dose to 5 mg weekly if skipping meals/appetite declines significantly.  Meds ordered this encounter  Medications   metFORMIN  (GLUCOPHAGE ) 500 MG tablet    Sig: Take 1 tablet (500 mg total) by mouth daily.    Dispense:  30 tablet    Refill:  0     Hypertension associated with type 2 diabetes mellitus Hypertension managed with multiple antihypertensive medications including amlodipine , carvedilol , lisinopril , Lasix , and spironolactone . Reports no SE with current medications.  Blood pressure well-controlled. Potential for medication reduction in the future as weight loss continues. BP Readings from Last 3 Encounters:  05/18/24 121/76  04/28/24 124/82  04/21/24 122/79   Lab Results  Component Value Date   NA 137  02/25/2024   CL 99 02/25/2024   K 4.4 02/25/2024   CO2 22 02/25/2024   BUN 11 02/25/2024   CREATININE 0.92 02/25/2024   EGFR 76 02/25/2024   CALCIUM  9.6 02/25/2024   ALBUMIN 3.8 (L) 02/25/2024   GLUCOSE 94 02/25/2024   Plan:  - Continue current antihypertensive regimen-amlodipine  10 mg daily, carvedilol  80 mg daily, lisinopril  40 mg daily, Lasix  20 mg daily, and spironolactone  25 mg daily. - Will discuss potential medication reduction with cardiologist and primary care provider. Continue to work on nutrition plan to promote weight loss and improve BP control.    Hyperlipidemia associated with type 2 diabetes mellitus Hyperlipidemia managed with Lipitor 10 mg daily. No SE.  Last lipids Lab Results  Component Value Date   CHOL 163 02/25/2024   HDL 33 (L) 02/25/2024   LDLCALC 103 (H) 02/25/2024   TRIG 152 (H) 02/25/2024   CHOLHDL 3.9 09/26/2022   The 10-year ASCVD risk score (Arnett DK, et al., 2019) is: 10.3%   Values used to calculate the score:     Age: 44 years     Clincally relevant sex: Female     Is Non-Hispanic African American: Yes  Diabetic: Yes     Tobacco smoker: No     Systolic Blood Pressure: 121 mmHg     Is BP treated: Yes     HDL Cholesterol: 33 mg/dL     Total Cholesterol: 163 mg/dL Continue Mounjaro  which should also help to decrease CV risks.  - Continue Lipitor 10 mg daily. Continue to work on nutrition plan -decreasing simple carbohydrates, increasing lean proteins, decreasing saturated fats and cholesterol , avoiding trans fats and exercise as able to promote weight loss, improve lipids and decrease cardiovascular risks.  Vitals Temp: 98.6 F (37 C) BP: 121/76 Pulse Rate: 66 SpO2: (!) 72 %   Anthropometric Measurements Height: 5' 3 (1.6 m) Weight: 237 lb (107.5 kg) BMI (Calculated): 41.99 Weight at Last Visit: 240 lb Weight Lost Since Last Visit: 3 lb Weight Gained Since Last Visit: 0 Starting Weight: 269 lb Total Weight Loss (lbs): 32  lb (14.5 kg) Peak Weight: 275 lb   Body Composition  Body Fat %: 44.3 % Fat Mass (lbs): 105.2 lbs Muscle Mass (lbs): 125.6 lbs Total Body Water (lbs): 84.8 lbs Visceral Fat Rating : 14   Other Clinical Data Fasting: No Labs: No Today's Visit #: 15 Starting Date: 07/18/23     ASSESSMENT AND PLAN:  Diet: Arieliz is currently in the action stage of change. As such, her goal is to continue with weight loss efforts. She has agreed to Category 2 Plan.  Exercise: Ellawyn has been instructed to continue exercising as is for weight loss and overall health benefits.   Behavior Modification:  We discussed the following Behavioral Modification Strategies today: increasing lean protein intake, decreasing simple carbohydrates, increasing vegetables, increase H2O intake, increase high fiber foods, no skipping meals, meal planning and cooking strategies, holiday eating strategies, avoiding temptations, and planning for success. We discussed various medication options to help [facility] with her weight loss efforts and we both agreed to continue Mounjaro  7.5 mg weekly and metformin  500 mg daily for primary indication of T2DM and monitor response.  Return in about 4 weeks (around 06/15/2024).SABRA She was informed of the importance of frequent follow up visits to maximize her success with intensive lifestyle modifications for her multiple health conditions.  Attestation Statements:   Reviewed by clinician on day of visit: allergies, medications, problem list, medical history, surgical history, family history, social history, and previous encounter notes.   Time spent on visit including pre-visit chart review and post-visit care and charting was 40 minutes.    Toini Failla, PA-C

## 2024-05-20 ENCOUNTER — Ambulatory Visit: Admitting: Family Medicine

## 2024-05-20 ENCOUNTER — Other Ambulatory Visit: Payer: Self-pay | Admitting: Family Medicine

## 2024-05-20 ENCOUNTER — Encounter: Payer: Self-pay | Admitting: Family Medicine

## 2024-05-20 VITALS — BP 124/70 | HR 90 | Temp 97.2°F | Ht 63.0 in

## 2024-05-20 DIAGNOSIS — L732 Hidradenitis suppurativa: Secondary | ICD-10-CM | POA: Diagnosis not present

## 2024-05-20 DIAGNOSIS — L304 Erythema intertrigo: Secondary | ICD-10-CM | POA: Diagnosis not present

## 2024-05-20 DIAGNOSIS — F411 Generalized anxiety disorder: Secondary | ICD-10-CM

## 2024-05-20 DIAGNOSIS — J309 Allergic rhinitis, unspecified: Secondary | ICD-10-CM | POA: Diagnosis not present

## 2024-05-20 DIAGNOSIS — Z23 Encounter for immunization: Secondary | ICD-10-CM | POA: Diagnosis not present

## 2024-05-20 MED ORDER — CLOTRIMAZOLE-BETAMETHASONE 1-0.05 % EX CREA: 1.0000 | TOPICAL_CREAM | Freq: Every day | CUTANEOUS | 0 refills | Status: AC

## 2024-05-20 MED ORDER — FLUTICASONE PROPIONATE 50 MCG/ACT NA SUSP: 2.0000 | Freq: Every day | NASAL | 6 refills | Status: AC

## 2024-05-20 MED ORDER — BUSPIRONE HCL 5 MG PO TABS: 5.0000 mg | ORAL_TABLET | Freq: Every day | ORAL | 6 refills | Status: AC | PRN

## 2024-05-20 MED ORDER — CLOTRIMAZOLE 1 % EX CREA: 1.0000 | TOPICAL_CREAM | Freq: Two times a day (BID) | CUTANEOUS | 0 refills | Status: AC

## 2024-05-20 MED ORDER — CETIRIZINE HCL 10 MG PO TABS: 10.0000 mg | ORAL_TABLET | Freq: Every day | ORAL | 11 refills | Status: AC

## 2024-05-20 NOTE — Assessment & Plan Note (Signed)
 Continue escitalopram  10 mg daily. I will renew her buspirone  for as needed use.

## 2024-05-20 NOTE — Assessment & Plan Note (Signed)
 I suspect allergic rhinitis to be the underlying cause of [name]' chronic cough. I recommend she start a daily antihistamine and using a daily steroid nasal spray. She should use her Navage to relieve nasal mucous.

## 2024-05-20 NOTE — Assessment & Plan Note (Signed)
 Tammy Hogan has long-standing issues with HS. Her ongoing weight loss efforts will hopefully help to reduce some of this.

## 2024-05-20 NOTE — Telephone Encounter (Signed)
Has appointment today.  Dm/cma ° °

## 2024-05-20 NOTE — Progress Notes (Signed)
 [facility] [address] [place] [address] Dept: [phone] Dept Fax: [phone]  Office Visit  Subjective:    Patient ID: Tammy Hogan, female    DOB: Aug 10, 1975, 48 y.o..   MRN: [id number]  Chief Complaint  Patient presents with   Cough    C/o having a persistent cough x 2 months.    History of Present Illness:  Patient is in today complaining of a persistent cough over the past 2-3 months. She is not running fever. She occasionally notes some mild sore throat. She admits to some nasal congestion and rhinorrhea that has been present. She has a history of GERD, but this is asymptomatic on her pantoprazole  40 mg daily. She is not wheezing.  Tammy Hogan has a history of hidradenitis suppurative. She continues to have abscesses develop in her axilla and under her breasts. She is using antibacterial soaps. She has used intermittent mupirocin for this. she also has a history of episodes of intertrigo. She has found antifungal creams to help with this.  Tammy Hogan has a history of generalized anxiety disorder. She notes there have been stressors at home. She is requesting a refill of her buspirone .  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   Bilateral sacroiliitis 11/20/2022   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   Facet  arthritis of lumbar region 02/14/2022   Hidradenitis suppurativa 04/21/2021   Anal fissure 04/21/2021   Keratosis pilaris 04/21/2021   Trochanteric bursitis of right hip 08/01/2020   Carpal tunnel syndrome of right wrist 06/26/2018   Left knee tendonitis 05/11/2018   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   Gastroesophageal reflux disease 06/20/2016   Mixed hyperlipidemia 05/30/2016   Cervicalgia 05/21/2016   Generalized anxiety disorder 05/15/2016   Arthralgia of multiple joints 05/15/2016   BMI 45.0-49.9, adult (HCC) 05/15/2016   Essential hypertension 09/07/2014   Cardiomyopathy (HCC) 07/02/2012   Systolic heart failure (HCC) 07/02/2012   Past Surgical History:  Procedure Laterality Date   CARDIAC CATHETERIZATION  feb, 2014   Dr Dann   FOOT SURGERY  10/2019   IR EMBO ARTERIAL NOT HEMORR HEMANG INC GUIDE ROADMAPPING  02/07/2024   IR RADIOLOGIST EVAL & MGMT  01/20/2024   IR RADIOLOGIST EVAL & MGMT  03/02/2024   ORIF ANKLE FRACTURE Left 07/10/2013   Procedure: OPEN REDUCTION INTERNAL FIXATION (ORIF) LEFT BIMALLEOLAR ANKLE FRACTURE;  Surgeon: [name], MD;  Location: [facility];  Service: Orthopedics;  Laterality: Left;   Family History  Problem Relation Age of Onset   Alcohol abuse Mother    Anxiety disorder Mother    Depression Mother    Hypertension Mother    Hyperlipidemia Mother    Colon polyps Mother    Other Father        drug overdose   Drug abuse Father        overdose   Hypertension Sister    Diabetes  Maternal Uncle    Diabetes Maternal Grandmother    Cancer Maternal Grandmother    Heart failure Paternal Grandmother    Hypertension Paternal Grandmother    Diabetes Paternal Grandmother    Glaucoma Paternal Grandmother    Cirrhosis Paternal Grandfather    Alcohol abuse Paternal Grandfather    Cancer Other 23       breast, cervical and colon cancer   Colon cancer Neg Hx    Esophageal cancer Neg Hx    Rectal cancer Neg Hx    Stomach  cancer Neg Hx    Outpatient Medications Prior to Visit  Medication Sig Dispense Refill   amLODipine  (NORVASC ) 10 MG tablet TAKE 1 TABLET BY MOUTH DAILY 90 tablet 2   aspirin  81 MG tablet Take 81 mg by mouth daily.     atorvastatin  (LIPITOR) 10 MG tablet Take 1 tablet (10 mg total) by mouth daily. 90 tablet 3   busPIRone  (BUSPAR ) 5 MG tablet TAKE 1 TABLET BY MOUTH DAILY AS NEEDED 30 tablet 6   carvedilol  (COREG  CR) 80 MG 24 hr capsule TAKE 1 CAPSULE BY MOUTH EVERY DAY 90 capsule 2   clotrimazole -betamethasone  (LOTRISONE ) cream Apply 1 Application topically daily. 30 g 0   docusate sodium  (COLACE) 100 MG capsule Take 1 capsule (100 mg total) by mouth daily as needed. 30 capsule 0   doxycycline  (VIBRA -TABS) 100 MG tablet Take 1 tablet (100 mg total) by mouth 2 (two) times daily. 20 tablet 0   escitalopram  (LEXAPRO ) 10 MG tablet TAKE 1 TABLET BY MOUTH DAILY 90 tablet 3   furosemide  (LASIX ) 20 MG tablet TAKE 1 TABLET BY MOUTH DAILY 90 tablet 2   gabapentin  (NEURONTIN ) 300 MG capsule TAKE 1 CAPSULE BY MOUTH AT BEDTIME 90 capsule 3   hydrOXYzine (ATARAX) 25 MG tablet Take 25 mg by mouth 3 (three) times daily.     levonorgestrel (MIRENA) 20 MCG/24HR IUD 1 each by Intrauterine route once.     lisinopril  (ZESTRIL ) 40 MG tablet Take 1 tablet (40 mg total) by mouth daily. 90 tablet 3   meloxicam  (MOBIC ) 15 MG tablet Take 1 tablet (15 mg total) by mouth daily. 30 tablet 3   metFORMIN  (GLUCOPHAGE ) 500 MG tablet Take 1 tablet (500 mg total) by mouth daily. 30 tablet 0   methylPREDNISolone  (MEDROL  DOSEPAK) 4 MG TBPK tablet Pt to follow taper per pharmacy (Patient not taking: Reported on 04/28/2024) 21 tablet 0   nitroGLYCERIN  (NITROSTAT ) 0.4 MG SL tablet Place 1 tablet (0.4 mg total) under the tongue every 5 (five) minutes as needed for chest pain. 25 tablet 3   nystatin  (MYCOSTATIN ) 100000 UNIT/ML suspension Take 5 mLs (500,000 Units total) by mouth 4 (four) times daily. 60 mL 0   nystatin  cream (MYCOSTATIN )  Apply 1 Application topically 2 (two) times daily.     ondansetron  (ZOFRAN ) 4 MG tablet Take 1 tablet (4 mg total) by mouth every 8 (eight) hours as needed. 18 tablet 0   pantoprazole  (PROTONIX ) 40 MG tablet TAKE 1 TABLET BY MOUTH DAILY 90 tablet 3   spironolactone  (ALDACTONE ) 25 MG tablet TAKE 1 TABLET BY MOUTH DAILY 90 tablet 3   tirzepatide  (MOUNJARO ) 5 MG/0.5ML Pen Inject 5 mg into the skin once a week. 2 mL 0   tirzepatide  (MOUNJARO ) 7.5 MG/0.5ML Pen Inject 7.5 mg into the skin once a week. 2 mL 0   No facility-administered medications prior to visit.   No Known Allergies   Objective:   Today's Vitals  05/20/24 1503  BP: 124/70  Pulse: 90  Temp: (!) 97.2 F (36.2 C)  TempSrc: Temporal  SpO2: 100%  Height: 5' 3 (1.6 m)   Body mass index is 41.98 kg/m.   General: Well developed, well nourished. No acute distress. HEENT: Normocephalic, non-traumatic. PERRL, EOMI. Conjunctiva clear. External ears normal. EAC and TMs   normal bilaterally. Nose with moderate congestion and moderate white mucous. Mucous membranes moist.   Oropharynx clear, though some food entrapment in tonsillar crypts. Good dentition. Neck: Supple. No lymphadenopathy. No thyromegaly. Lungs: Clear to auscultation bilaterally. No wheezing, rales or rhonchi. CV: RRR without murmurs or rubs. Pulses 2+ bilaterally. Skin: There is mild redness with peeling in the lower abdominal fold. Psych: Alert and oriented. Normal mood and affect.  Health Maintenance Due  Topic Date Due   FOOT EXAM  Never done   OPHTHALMOLOGY EXAM  Never done   Diabetic kidney evaluation - Urine ACR  Never done   Hepatitis B Vaccines 19-59 Average Risk (1 of 3 - 19+ 3-dose series) Never done   Pneumococcal Vaccine (1 of 2 - PCV) 07/11/2013   Influenza Vaccine  01/10/2024   COVID-19 Vaccine (3 - 2025-26 season) 02/10/2024     Assessment & Plan:   Problem List Items Addressed This Visit       Respiratory   Allergic rhinitis - Primary    I suspect allergic rhinitis to be the underlying cause of Tammy Hogan' chronic cough. I recommend she start a daily antihistamine and using a daily steroid nasal spray. She should use her Navage to relieve nasal mucous.      Relevant Medications   fluticasone  (FLONASE ) 50 MCG/ACT nasal spray   cetirizine (ZYRTEC) 10 MG tablet     Musculoskeletal and Integument   Hidradenitis suppurativa   Tammy Hogan has long-standing issues with HS. Her ongoing weight loss efforts will hopefully help to reduce some of this.      Relevant Medications   clotrimazole -betamethasone  (LOTRISONE ) cream   clotrimazole  (LOTRIMIN ) 1 % cream   Intertrigo   I will renew an antifungal cream for managing this. Continue ongoing efforts at weight loss.      Relevant Medications   clotrimazole  (LOTRIMIN ) 1 % cream     Other   Generalized anxiety disorder   Continue escitalopram  10 mg daily. I will renew her buspirone  for as needed use.      Relevant Medications   busPIRone  (BUSPAR ) 5 MG tablet   Other Visit Diagnoses       Need for immunization against influenza       Relevant Orders   Flu vaccine trivalent PF, 6mos and older(Flulaval,Afluria,Fluarix,Fluzone) (Completed)       Return in about 4 weeks (around 06/17/2024) for Reassessment, follow-up diabetes.    Garnette CHRISTELLA Simpler, MD  I,Emily Lagle,acting as a scribe for Garnette CHRISTELLA Simpler, MD.,have documented all relevant documentation on the behalf of Garnette CHRISTELLA Simpler, MD.  I, Garnette CHRISTELLA Simpler, MD, have reviewed all documentation for this visit. The documentation on 05/20/2024 for the exam, diagnosis, procedures, and orders are all accurate and complete.

## 2024-05-20 NOTE — Assessment & Plan Note (Signed)
 I will renew an antifungal cream for managing this. Continue ongoing efforts at weight loss.

## 2024-05-26 ENCOUNTER — Other Ambulatory Visit (INDEPENDENT_AMBULATORY_CARE_PROVIDER_SITE_OTHER): Payer: Self-pay | Admitting: Family Medicine

## 2024-06-05 ENCOUNTER — Encounter: Payer: Self-pay | Admitting: Family Medicine

## 2024-06-05 ENCOUNTER — Other Ambulatory Visit: Payer: Self-pay | Admitting: Family Medicine

## 2024-06-05 DIAGNOSIS — L732 Hidradenitis suppurativa: Secondary | ICD-10-CM

## 2024-06-06 ENCOUNTER — Encounter (INDEPENDENT_AMBULATORY_CARE_PROVIDER_SITE_OTHER): Payer: Self-pay | Admitting: Family Medicine

## 2024-06-08 ENCOUNTER — Other Ambulatory Visit (INDEPENDENT_AMBULATORY_CARE_PROVIDER_SITE_OTHER): Payer: Self-pay

## 2024-06-08 MED ORDER — TIRZEPATIDE 7.5 MG/0.5ML ~~LOC~~ SOAJ: 7.5000 mg | SUBCUTANEOUS | 0 refills | Status: DC

## 2024-06-08 NOTE — Telephone Encounter (Signed)
 Ok to rf x 1

## 2024-06-08 NOTE — Telephone Encounter (Signed)
 Pleas review patient request.  Thanks.   Dm/cma

## 2024-06-15 ENCOUNTER — Ambulatory Visit: Admitting: Psychology

## 2024-06-15 DIAGNOSIS — E1169 Type 2 diabetes mellitus with other specified complication: Secondary | ICD-10-CM

## 2024-06-16 ENCOUNTER — Telehealth: Payer: Self-pay | Admitting: Family Medicine

## 2024-06-16 ENCOUNTER — Encounter: Payer: Self-pay | Admitting: Family Medicine

## 2024-06-16 ENCOUNTER — Ambulatory Visit: Admitting: Family Medicine

## 2024-06-16 VITALS — BP 116/72 | HR 90 | Temp 97.9°F | Ht 63.0 in | Wt 241.6 lb

## 2024-06-16 DIAGNOSIS — L304 Erythema intertrigo: Secondary | ICD-10-CM | POA: Diagnosis not present

## 2024-06-16 MED ORDER — FLUCONAZOLE 150 MG PO TABS: 150.0000 mg | ORAL_TABLET | ORAL | 0 refills | Status: DC

## 2024-06-16 NOTE — Telephone Encounter (Signed)
 Called [facility] and documented  the call

## 2024-06-16 NOTE — Progress Notes (Signed)
 " [facility] [address] [place] [address] Dept: [phone] Dept Fax: [phone]  Office Visit  Subjective:    Patient ID: Tammy Hogan, female    DOB: [birth year], 49 y.o..   MRN: [id number]  Chief Complaint  Patient presents with   Rash    C/o having a rash under breast.  Has been using cream.     History of Present Illness:  Patient is in today for a reoccurring rash under her right breast. She has a history of episodes of intertrigo. She was last treated for this  on 05/20/2024. I had prescribed clotrimazole  cream. She had also requested Lotrisone  cream as she felt the steroid helped better. She apparently had initial improvement, but then it recurred. She had some triamcinolone  cream at home that she used. She felt this worked better than the Lotrisone . On 12/26, she contacted me to request a renewal of this. I did explain that a steroid cream would improve the rash, but it would then recur, as this did not have an antifungal in it. I recommended she follow-up for reassessment. Today, she confirms that the steroid improved the rash, but then it recurred afterwards. Currently, the rash has improved, but not resolved.  Past Medical History: Patient Active Problem List   Diagnosis Date Noted   Allergic rhinitis 05/20/2024   Intertrigo 05/20/2024   Obesity, unspecified 02/25/2024   BMI 40.0-44.9, adult (HCC) 02/25/2024   Slow transit constipation 01/29/2024   Chronic pain of left knee 12/26/2023   Primary osteoarthritis of left knee 12/26/2023   Diabetes mellitus (HCC) 12/03/2023   Prediabetes 10/10/2023   Depression 10/10/2023   Vitamin D  deficiency 07/19/2023   Other fatigue 07/18/2023   SOBOE (shortness of breath on exertion) 07/18/2023   Obesity, Beginning BMI 47.7 07/18/2023   Thyroid  enlargement 07/05/2023   LPRD (laryngopharyngeal reflux disease) 12/20/2022   Bilateral sacroiliitis 11/20/2022    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   Facet arthritis of lumbar region 02/14/2022   Hidradenitis suppurativa 04/21/2021   Anal fissure 04/21/2021   Keratosis pilaris 04/21/2021   Trochanteric bursitis of right hip 08/01/2020   Carpal tunnel syndrome of right wrist 06/26/2018   Left knee tendonitis 05/11/2018   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   Gastroesophageal reflux disease 06/20/2016   Mixed hyperlipidemia 05/30/2016   Cervicalgia 05/21/2016   Generalized anxiety disorder 05/15/2016   Arthralgia of multiple joints 05/15/2016   BMI 45.0-49.9, adult (HCC) 05/15/2016   Essential hypertension 09/07/2014   Cardiomyopathy (HCC) 07/02/2012   Systolic heart failure (HCC) 07/02/2012   Past Surgical History:  Procedure Laterality Date   CARDIAC CATHETERIZATION  07/2012   Dr Dann   FOOT SURGERY  10/2019   FRACTURE SURGERY  07/2013   IR EMBO ARTERIAL NOT HEMORR HEMANG INC GUIDE ROADMAPPING  02/07/2024   IR RADIOLOGIST EVAL & MGMT  01/20/2024   IR RADIOLOGIST EVAL & MGMT  03/02/2024   ORIF ANKLE FRACTURE Left 07/10/2013   Procedure: OPEN REDUCTION INTERNAL FIXATION (ORIF) LEFT BIMALLEOLAR ANKLE FRACTURE;  Surgeon: [name], MD;  Location: [facility];  Service: Orthopedics;  Laterality: Left;   Family History  Problem Relation Age of Onset   Alcohol abuse Mother    Anxiety disorder Mother    Depression Mother    Hypertension Mother    Hyperlipidemia Mother    Colon polyps Mother    Arthritis Mother    Other Father  drug overdose   Drug abuse Father        overdose   Early death Father    Hypertension Sister    COPD Sister    Depression Sister    Obesity Sister    Diabetes Maternal Uncle    Diabetes Maternal Grandmother    Cancer Maternal Grandmother    Heart failure Paternal Grandmother    Hypertension Paternal Grandmother    Diabetes Paternal Grandmother    Glaucoma Paternal Grandmother    Heart  disease Paternal Grandmother    Cirrhosis Paternal Grandfather    Alcohol abuse Paternal Grandfather    Cancer Other 36       breast, cervical and colon cancer   Depression Brother    Colon cancer Neg Hx    Esophageal cancer Neg Hx    Rectal cancer Neg Hx    Stomach cancer Neg Hx    Outpatient Medications Prior to Visit  Medication Sig Dispense Refill   amLODipine  (NORVASC ) 10 MG tablet TAKE 1 TABLET BY MOUTH DAILY 90 tablet 2   aspirin  81 MG tablet Take 81 mg by mouth daily.     atorvastatin  (LIPITOR) 10 MG tablet Take 1 tablet (10 mg total) by mouth daily. 90 tablet 3   busPIRone  (BUSPAR ) 5 MG tablet Take 1 tablet (5 mg total) by mouth daily as needed. 30 tablet 6   carvedilol  (COREG  CR) 80 MG 24 hr capsule TAKE 1 CAPSULE BY MOUTH EVERY DAY 90 capsule 2   cetirizine  (ZYRTEC ) 10 MG tablet Take 1 tablet (10 mg total) by mouth daily. 30 tablet 11   clotrimazole  (LOTRIMIN ) 1 % cream Apply 1 Application topically 2 (two) times daily. 30 g 0   clotrimazole -betamethasone  (LOTRISONE ) cream Apply 1 Application topically daily. 30 g 0   docusate sodium  (COLACE) 100 MG capsule Take 1 capsule (100 mg total) by mouth daily as needed. 30 capsule 0   escitalopram  (LEXAPRO ) 10 MG tablet TAKE 1 TABLET BY MOUTH DAILY 90 tablet 3   fluticasone  (FLONASE ) 50 MCG/ACT nasal spray Place 2 sprays into both nostrils daily. 16 g 6   furosemide  (LASIX ) 20 MG tablet TAKE 1 TABLET BY MOUTH DAILY 90 tablet 2   gabapentin  (NEURONTIN ) 300 MG capsule TAKE 1 CAPSULE BY MOUTH AT BEDTIME 90 capsule 3   hydrOXYzine (ATARAX) 25 MG tablet Take 25 mg by mouth 3 (three) times daily.     levonorgestrel (MIRENA) 20 MCG/24HR IUD 1 each by Intrauterine route once.     lisinopril  (ZESTRIL ) 40 MG tablet Take 1 tablet (40 mg total) by mouth daily. 90 tablet 3   meloxicam  (MOBIC ) 15 MG tablet Take 1 tablet (15 mg total) by mouth daily. 30 tablet 3   metFORMIN  (GLUCOPHAGE ) 500 MG tablet Take 1 tablet (500 mg total) by mouth daily. 30  tablet 0   nitroGLYCERIN  (NITROSTAT ) 0.4 MG SL tablet Place 1 tablet (0.4 mg total) under the tongue every 5 (five) minutes as needed for chest pain. 25 tablet 3   ondansetron  (ZOFRAN ) 4 MG tablet Take 1 tablet (4 mg total) by mouth every 8 (eight) hours as needed. 18 tablet 0   pantoprazole  (PROTONIX ) 40 MG tablet TAKE 1 TABLET BY MOUTH DAILY 90 tablet 3   spironolactone  (ALDACTONE ) 25 MG tablet TAKE 1 TABLET BY MOUTH DAILY 90 tablet 3   tirzepatide  (MOUNJARO ) 7.5 MG/0.5ML Pen Inject 7.5 mg into the skin once a week. 2 mL 0   triamcinolone  cream (KENALOG ) 0.1 % APPLY TO THE  AFFECTED AREA(S) 2 TIMES A DAY 30 g 0   No facility-administered medications prior to visit.   Allergies[1]   Objective:   Today's Vitals   06/16/24 1338  BP: 116/72  Pulse: 90  Temp: 97.9 F (36.6 C)  TempSrc: Temporal  SpO2: 99%  Weight: 241 lb 9.6 oz (109.6 kg)  Height: 5' 3 (1.6 m)   Body mass index is 42.8 kg/m.   General: Well developed, well nourished. No acute distress. Skin: There is a large oval patch with a scaly border on the lower right breast and upper abdominal wall. The central area appears to be   hypopigmented compared tot he surrounding skin. Psych: Alert and oriented. Normal mood and affect.  Health Maintenance Due  Topic Date Due   FOOT EXAM  Never done   OPHTHALMOLOGY EXAM  Never done   Diabetic kidney evaluation - Urine ACR  Never done   Hepatitis B Vaccines 19-59 Average Risk (1 of 3 - 19+ 3-dose series) Never done   Pneumococcal Vaccine (1 of 2 - PCV) 07/11/2013   COVID-19 Vaccine (3 - 2025-26 season) 02/10/2024     Assessment & Plan:   Problem List Items Addressed This Visit       Musculoskeletal and Integument   Intertrigo - Primary   Consider resistant fungal infection vs. erythrasma. No sign of inverse psoriasis. I obtained skin swabs to assess for Candida or other fungal elements and for routine bacterial culture. I will prescribe fluconazole  150 mg once a week for 4  weeks.      Relevant Medications   fluconazole  (DIFLUCAN ) 150 MG tablet   Other Relevant Orders   Fungal Stain   Fungus Culture & Smear   Wound culture   Anaerobic and Aerobic Culture    Return if symptoms worsen or fail to improve.    Garnette CHRISTELLA Simpler, MD  I,Emily Lagle,acting as a scribe for Garnette CHRISTELLA Simpler, MD.,have documented all relevant documentation on the behalf of Garnette CHRISTELLA Simpler, MD.  I, Garnette CHRISTELLA Simpler, MD, have reviewed all documentation for this visit. The documentation on 06/16/2024 for the exam, diagnosis, procedures, and orders are all accurate and complete.     [1] No Known Allergies  "

## 2024-06-16 NOTE — Assessment & Plan Note (Addendum)
 Consider resistant fungal infection vs. erythrasma. No sign of inverse psoriasis. I obtained skin swabs to assess for Candida or other fungal elements and for routine bacterial culture. I will prescribe fluconazole  150 mg once a week for 4 weeks.

## 2024-06-23 ENCOUNTER — Encounter (INDEPENDENT_AMBULATORY_CARE_PROVIDER_SITE_OTHER): Payer: Self-pay | Admitting: Family Medicine

## 2024-06-23 ENCOUNTER — Ambulatory Visit (INDEPENDENT_AMBULATORY_CARE_PROVIDER_SITE_OTHER): Admitting: Family Medicine

## 2024-06-23 VITALS — BP 112/77 | HR 81 | Temp 98.2°F | Ht 63.0 in | Wt 233.0 lb

## 2024-06-23 DIAGNOSIS — Z7985 Long-term (current) use of injectable non-insulin antidiabetic drugs: Secondary | ICD-10-CM

## 2024-06-23 DIAGNOSIS — K5901 Slow transit constipation: Secondary | ICD-10-CM | POA: Diagnosis not present

## 2024-06-23 DIAGNOSIS — E119 Type 2 diabetes mellitus without complications: Secondary | ICD-10-CM | POA: Diagnosis not present

## 2024-06-23 DIAGNOSIS — E1169 Type 2 diabetes mellitus with other specified complication: Secondary | ICD-10-CM

## 2024-06-23 DIAGNOSIS — Z6841 Body Mass Index (BMI) 40.0 and over, adult: Secondary | ICD-10-CM

## 2024-06-23 MED ORDER — TIRZEPATIDE 7.5 MG/0.5ML ~~LOC~~ SOAJ: 7.5000 mg | SUBCUTANEOUS | 0 refills | Status: AC

## 2024-06-23 MED ORDER — DOCUSATE SODIUM 100 MG PO CAPS: 100.0000 mg | ORAL_CAPSULE | Freq: Every day | ORAL | 0 refills | Status: AC | PRN

## 2024-06-23 NOTE — Progress Notes (Signed)
 "  Office: [phone]  /  Fax: [phone]  WEIGHT SUMMARY AND BIOMETRICS  Anthropometric Measurements Height: 5' 3 (1.6 m) Weight: 233 lb (105.7 kg) BMI (Calculated): 41.28 Weight at Last Visit: 237 lb Weight Lost Since Last Visit: 4 lb Weight Gained Since Last Visit: 0 Starting Weight: 269 lb Total Weight Loss (lbs): 36 lb (16.3 kg) Peak Weight: 275 lb   Body Composition  Body Fat %: 46 % Fat Mass (lbs): 107.6 lbs Muscle Mass (lbs): 119.8 lbs Total Body Water (lbs): 85.2 lbs Visceral Fat Rating : 14   Other Clinical Data Fasting: yes Labs: no Today's Visit #: 16 Starting Date: 07/18/23    Chief Complaint: OBESITY   History of Present Illness Tammy Hogan is a 49 year old female with obesity and type 2 diabetes who presents for obesity treatment and progress assessment.  She is actively managing her obesity through dietary modifications and physical activity, adhering to the category two eating plan about 40% of the time. She exercises for 60 minutes three times a week, engaging in activities such as swimming and free weights. Over the past month, she has lost four pounds, even during the holiday period.  Her type 2 diabetes is managed through lifestyle changes, including diet and exercise, alongside her weight loss efforts. She is currently on Mounjaro  7.5 mg and requests a refill. Her most recent hemoglobin A1c, measured in September of last year, was 5.5.  She experiences constipation, which she manages with Colace 100 mg as needed, and requests a refill for this medication. No issues with her current medications, Mounjaro  or Colace. She maintains a positive outlook on her weight loss journey, noting a change in her mindset towards food and exercise, and feels that weight loss is manageable with a relatively healthy diet, allowing for occasional indulgences without significant impact on her progress.      PHYSICAL EXAM:  Blood pressure 112/77, pulse  81, temperature 98.2 F (36.8 C), height 5' 3 (1.6 m), weight 233 lb (105.7 kg), SpO2 97%. Body mass index is 41.27 kg/m.  DIAGNOSTIC DATA REVIEWED BY MYSELF TODAY:  BMET    Component Value Date/Time   NA 137 02/25/2024 0824   K 4.4 02/25/2024 0824   CL 99 02/25/2024 0824   CO2 22 02/25/2024 0824   GLUCOSE 94 02/25/2024 0824   GLUCOSE 99 04/19/2023 1552   BUN 11 02/25/2024 0824   CREATININE 0.92 02/25/2024 0824   CREATININE 0.79 04/19/2023 1552   CALCIUM  9.6 02/25/2024 0824   GFRNONAA 89 08/05/2019 1705   GFRAA 102 08/05/2019 1705   Lab Results  Component Value Date   HGBA1C 5.5 02/25/2024   HGBA1C 5.4 07/03/2012   Lab Results  Component Value Date   INSULIN  33.2 (H) 02/25/2024   INSULIN  55.6 (H) 07/18/2023   Lab Results  Component Value Date   TSH 1.740 07/18/2023   CBC    Component Value Date/Time   WBC 7.0 02/25/2024 0824   WBC 12.9 (H) 11/20/2022 0844   RBC 4.14 02/25/2024 0824   RBC 3.93 11/20/2022 0844   HGB 11.5 02/25/2024 0824   HCT 36.9 02/25/2024 0824   PLT 406 02/25/2024 0824   MCV 89 02/25/2024 0824   MCH 27.8 02/25/2024 0824   MCH 29.1 07/10/2013 1528   MCHC 31.2 (L) 02/25/2024 0824   MCHC 31.6 11/20/2022 0844   RDW 13.2 02/25/2024 0824   Iron Studies    Component Value Date/Time   IRON 51 07/05/2023 1427  TIBC 344 07/05/2023 1427   FERRITIN 104 07/05/2023 1427   IRONPCTSAT 15 07/05/2023 1427   Lipid Panel     Component Value Date/Time   CHOL 163 02/25/2024 0824   TRIG 152 (H) 02/25/2024 0824   HDL 33 (L) 02/25/2024 0824   CHOLHDL 3.9 09/26/2022 1133   CHOLHDL 5 05/10/2021 0813   VLDL 30.4 05/10/2021 0813   LDLCALC 103 (H) 02/25/2024 0824   Hepatic Function Panel     Component Value Date/Time   PROT 6.4 02/25/2024 0824   ALBUMIN 3.8 (L) 02/25/2024 0824   AST 11 02/25/2024 0824   ALT 14 02/25/2024 0824   ALKPHOS 108 02/25/2024 0824   BILITOT 0.3 02/25/2024 0824   BILIDIR 0.1 11/20/2022 0844      Component Value  Date/Time   TSH 1.740 07/18/2023 1237   Nutritional Lab Results  Component Value Date   VD25OH 47.1 02/25/2024   VD25OH 19.1 (L) 07/18/2023   VD25OH 35.51 05/10/2021     Assessment and Plan Assessment & Plan Morbid obesity She is following the category eating plan 40% of the time and exercises 60 minutes three times a week, combining swimming and free weights. She has lost 4 pounds in the last month, including over the Christmas and New Year's holidays. She reports difficulty in meeting protein goals and meal planning, but is actively trying to find strategies to improve. She feels that losing weight is not difficult as long as she eats well most of the time and can manage occasional cheats. She is taking swim lessons to change her routine from regular aerobics. - Continue category eating plan and exercise regimen. - Provided recipes for meal planning and preparation which meet her calorie and protein goals. - Encouraged continued efforts in meeting protein goals and meal planning.  Type 2 diabetes mellitus She is managing her type 2 diabetes primarily through diet, exercise, and weight loss. She is on Mounjaro  7.5 mg and requests a refill. Her most recent hemoglobin A1c was 5.5, indicating well-controlled glucose levels. - Refilled Mounjaro  7.5 mg prescription. - Continue diet, exercise, and weight loss efforts to manage glucose levels.  Slow transit constipation She is currently being treated with Colace 100 mg daily as needed for constipation and requests a refill. - Refilled Colace 100 mg prescription.      Patients who are on anti-obesity medications are counseled on the importance of maintaining healthy lifestyle habits, including balanced nutrition, regular physical activity, and behavioral modifications,  Medication is an adjunct to, not a replacement for, lifestyle changes and that the long-term success and weight maintenance depend on continued adherence to these  strategies.   Tammy Hogan was informed of the importance of frequent follow up visits to maximize her success with intensive lifestyle modifications for her obesity and obesity related health conditions as recommended by USPSTF and CMS guidelines  Louann Penton, MD   "

## 2024-06-25 ENCOUNTER — Telehealth (INDEPENDENT_AMBULATORY_CARE_PROVIDER_SITE_OTHER): Payer: Self-pay

## 2024-06-25 NOTE — Telephone Encounter (Signed)
 Mounjaro  Prior Authorization  Information regarding your request  Your PA has been resolved, no additional PA is required. For further inquiries please contact the number on the back of the member prescription card. (Message [phone])

## 2024-07-13 ENCOUNTER — Ambulatory Visit: Admitting: Psychology

## 2024-07-13 ENCOUNTER — Other Ambulatory Visit: Payer: Self-pay | Admitting: Family Medicine

## 2024-07-13 DIAGNOSIS — F331 Major depressive disorder, recurrent, moderate: Secondary | ICD-10-CM | POA: Diagnosis not present

## 2024-07-13 DIAGNOSIS — K219 Gastro-esophageal reflux disease without esophagitis: Secondary | ICD-10-CM

## 2024-07-13 NOTE — Progress Notes (Deleted)
   Severo Beber, LCSW

## 2024-07-14 ENCOUNTER — Encounter: Payer: Self-pay | Admitting: Family Medicine

## 2024-07-14 ENCOUNTER — Ambulatory Visit: Admitting: Family Medicine

## 2024-07-14 VITALS — BP 124/78 | HR 72 | Temp 97.7°F | Ht 63.0 in | Wt 242.8 lb

## 2024-07-14 DIAGNOSIS — L732 Hidradenitis suppurativa: Secondary | ICD-10-CM

## 2024-07-14 DIAGNOSIS — G44219 Episodic tension-type headache, not intractable: Secondary | ICD-10-CM | POA: Diagnosis not present

## 2024-07-14 DIAGNOSIS — M436 Torticollis: Secondary | ICD-10-CM

## 2024-07-14 NOTE — Patient Instructions (Signed)
 Tension Headache, Adult A tension headache is a feeling of pain, pressure, or aching over the front and sides of the head. The pain can be dull, or it can feel tight. There are two types of tension headache: Episodic tension headache. This is when the headaches happen fewer than 15 days a month. Chronic tension headache. This is when the headaches happen more than 15 days a month during a 30-month period. A tension headache can last from 30 minutes to several days. It is the most common kind of headache. Tension headaches are not normally associated with nausea or vomiting, and they do not get worse with physical activity. What are the causes? The exact cause of this condition is not known. Tension headaches are often triggered by stress, anxiety, or depression. Other triggers may include: Alcohol. Too much caffeine or caffeine withdrawal. Respiratory infections, such as colds, flu, or sinus infections. Dental problems or teeth clenching. Fatigue. Holding your head and neck in the same position for a long period of time, such as while using a computer. Smoking. Arthritis of the neck. What are the signs or symptoms? Symptoms of this condition include: A feeling of pressure or tightness around the head. Dull, aching head pain. Pain over the front and sides of the head. Tenderness in the muscles of the head, neck, and shoulders. How is this diagnosed? This condition may be diagnosed based on your symptoms, your medical history, and a physical exam. If your symptoms are severe or unusual, you may have imaging tests, such as a CT scan or an MRI of your head. Your vision may also be checked. How is this treated? This condition may be treated with lifestyle changes and with medicines that help relieve symptoms. Follow these instructions at home: Managing pain Take over-the-counter and prescription medicines only as told by your health care provider. When you have a headache, lie down in a dark,  quiet room. If directed, put ice on your head and neck. To do this: Put ice in a plastic bag. Place a towel between your skin and the bag. Leave the ice on for 20 minutes, 2-3 times a day. Remove the ice if your skin turns bright red. This is very important. If you cannot feel pain, heat, or cold, you have a greater risk of damage to the area. If directed, apply heat to the back of your neck as often as told by your health care provider. Use the heat source that your health care provider recommends, such as a moist heat pack or a heating pad. Place a towel between your skin and the heat source. Leave the heat on for 20-30 minutes. Remove the heat if your skin turns bright red. This is especially important if you are unable to feel pain, heat, or cold. You have a greater risk of getting burned. Eating and drinking Eat meals on a regular schedule. If you drink alcohol: Limit how much you have to: 0-1 drink a day for women who are not pregnant. 0-2 drinks a day for men. Know how much alcohol is in your drink. In the [place] one drink equals one 12 oz bottle of beer (355 mL), one 5 oz glass of wine (148 mL), or one 1 oz glass of hard liquor (44 mL). Drink enough fluid to keep your urine pale yellow. Decrease your caffeine intake, or stop using caffeine. Lifestyle Get 7-9 hours of sleep each night, or get the amount of sleep recommended by your health care provider. At bedtime,  remove computers, phones, and tablets from your room. Find ways to manage your stress. This may include: Exercise. Deep breathing exercises. Yoga. Listening to music. Positive mental imagery. Try to sit up straight and avoid tensing your muscles. Do not use any products that contain nicotine or tobacco. These include cigarettes, chewing tobacco, and vaping devices, such as e-cigarettes. If you need help quitting, ask your health care provider. General instructions  Avoid any headache triggers. Keep a journal to help  find out what may trigger your headaches. For example, write down: What you eat and drink. How much sleep you get. Any change to your diet or medicines. Keep all follow-up visits. This is important. Contact a health care provider if: Your headache does not get better. Your headache comes back. You are sensitive to sounds, light, or smells because of a headache. You have nausea or you vomit. Your stomach hurts. Get help right away if: You suddenly develop a severe headache, along with any of the following: A stiff neck. Nausea and vomiting. Confusion. Weakness in one part or one side of your body. Double vision or loss of vision. Shortness of breath. Rash. Unusual sleepiness. Fever or chills. Trouble speaking. Pain in your eye or ear. Trouble walking or balancing. Feeling faint or passing out. Summary A tension headache is a feeling of pain, pressure, or aching over the front and sides of the head. A tension headache can last from 30 minutes to several days. It is the most common kind of headache. This condition may be diagnosed based on your symptoms, your medical history, and a physical exam. This condition may be treated with lifestyle changes and with medicines that help relieve symptoms. This information is not intended to replace advice given to you by your health care provider. Make sure you discuss any questions you have with your health care provider. Document Revised: 02/22/2023 Document Reviewed: 02/22/2023 Elsevier Patient Education  2024 [employer].

## 2024-07-14 NOTE — Assessment & Plan Note (Signed)
 [name] has long-standing issues with HS. Her ongoing weight loss efforts will hopefully help to reduce some of this. I completed FMLA paperwork regarding intermittent absences due to flares.

## 2024-07-17 ENCOUNTER — Ambulatory Visit: Admitting: Family Medicine

## 2024-07-17 LAB — FUNGUS CULTURE W SMEAR
CULTURE:: NO GROWTH
MICRO NUMBER:: 17431943
SMEAR:: NONE SEEN
SPECIMEN QUALITY:: ADEQUATE

## 2024-07-17 LAB — ANAEROBIC AND AEROBIC CULTURE
MICRO NUMBER:: 17431941
MICRO NUMBER:: 17431942
SPECIMEN QUALITY:: ADEQUATE
SPECIMEN QUALITY:: ADEQUATE

## 2024-07-17 LAB — TIQ-NTM

## 2024-07-20 ENCOUNTER — Ambulatory Visit (INDEPENDENT_AMBULATORY_CARE_PROVIDER_SITE_OTHER): Admitting: Family Medicine

## 2024-08-11 ENCOUNTER — Ambulatory Visit: Admitting: Psychology

## 2024-08-17 ENCOUNTER — Ambulatory Visit (INDEPENDENT_AMBULATORY_CARE_PROVIDER_SITE_OTHER): Admitting: Family Medicine

## 2024-09-10 ENCOUNTER — Ambulatory Visit: Admitting: Internal Medicine

## 2024-09-21 ENCOUNTER — Ambulatory Visit (INDEPENDENT_AMBULATORY_CARE_PROVIDER_SITE_OTHER): Admitting: Family Medicine
